# Patient Record
Sex: Female | Born: 1940 | Race: White | Hispanic: No
Health system: Midwestern US, Community
[De-identification: ages and names within clinical notes are randomized; demographics above are authoritative.]

## PROBLEM LIST (undated history)

## (undated) DIAGNOSIS — I10 Essential (primary) hypertension: Secondary | ICD-10-CM

## (undated) DIAGNOSIS — C541 Malignant neoplasm of endometrium: Secondary | ICD-10-CM

## (undated) DIAGNOSIS — E559 Vitamin D deficiency, unspecified: Secondary | ICD-10-CM

## (undated) DIAGNOSIS — I493 Ventricular premature depolarization: Secondary | ICD-10-CM

## (undated) DIAGNOSIS — I739 Peripheral vascular disease, unspecified: Secondary | ICD-10-CM

## (undated) DIAGNOSIS — I2510 Atherosclerotic heart disease of native coronary artery without angina pectoris: Secondary | ICD-10-CM

## (undated) DIAGNOSIS — I671 Cerebral aneurysm, nonruptured: Secondary | ICD-10-CM

## (undated) DIAGNOSIS — E785 Hyperlipidemia, unspecified: Secondary | ICD-10-CM

## (undated) DIAGNOSIS — I5181 Takotsubo syndrome: Secondary | ICD-10-CM

## (undated) DIAGNOSIS — E7800 Pure hypercholesterolemia, unspecified: Secondary | ICD-10-CM

## (undated) DIAGNOSIS — I491 Atrial premature depolarization: Secondary | ICD-10-CM

## (undated) DIAGNOSIS — I517 Cardiomegaly: Secondary | ICD-10-CM

## (undated) DIAGNOSIS — I350 Nonrheumatic aortic (valve) stenosis: Secondary | ICD-10-CM

## (undated) DIAGNOSIS — K219 Gastro-esophageal reflux disease without esophagitis: Secondary | ICD-10-CM

## (undated) DIAGNOSIS — I119 Hypertensive heart disease without heart failure: Secondary | ICD-10-CM

## (undated) DIAGNOSIS — I6529 Occlusion and stenosis of unspecified carotid artery: Secondary | ICD-10-CM

## (undated) DIAGNOSIS — I421 Obstructive hypertrophic cardiomyopathy: Secondary | ICD-10-CM

## (undated) DIAGNOSIS — I214 Non-ST elevation (NSTEMI) myocardial infarction: Secondary | ICD-10-CM

## (undated) DIAGNOSIS — N950 Postmenopausal bleeding: Secondary | ICD-10-CM

## (undated) DIAGNOSIS — Z780 Asymptomatic menopausal state: Secondary | ICD-10-CM

## (undated) DIAGNOSIS — R252 Cramp and spasm: Principal | ICD-10-CM

## (undated) DIAGNOSIS — E782 Mixed hyperlipidemia: Secondary | ICD-10-CM

## (undated) HISTORY — DX: Cardiomegaly: I51.7

## (undated) HISTORY — DX: Obstructive hypertrophic cardiomyopathy: I42.1

## (undated) HISTORY — DX: Takotsubo syndrome: I51.81

## (undated) HISTORY — DX: Hypertensive heart disease without heart failure: I11.9

## (undated) HISTORY — DX: Nonrheumatic aortic (valve) stenosis: I35.0

## (undated) HISTORY — PX: ABDOMINAL HYSTERECTOMY: SHX81

## (undated) HISTORY — DX: Cerebral aneurysm, nonruptured: I67.1

## (undated) HISTORY — DX: Atrial premature depolarization: I49.1

## (undated) HISTORY — DX: Occlusion and stenosis of unspecified carotid artery: I65.29

## (undated) HISTORY — DX: Hyperlipidemia, unspecified: E78.5

## (undated) HISTORY — DX: Pure hypercholesterolemia, unspecified: E78.00

## (undated) HISTORY — PX: BELOW KNEE LEG AMPUTATION: SUR23

## (undated) HISTORY — DX: Essential (primary) hypertension: I10

## (undated) HISTORY — DX: Peripheral vascular disease, unspecified: I73.9

## (undated) HISTORY — DX: Atherosclerotic heart disease of native coronary artery without angina pectoris: I25.10

## (undated) HISTORY — DX: Ventricular premature depolarization: I49.3

---

## 1998-09-07 ENCOUNTER — Emergency Department (HOSPITAL_COMMUNITY): Admission: EM | Admit: 1998-09-07 | Discharge: 1998-09-07 | Payer: Self-pay

## 1998-10-19 ENCOUNTER — Encounter: Payer: Self-pay | Admitting: Chiropractic Medicine

## 1998-10-19 ENCOUNTER — Ambulatory Visit (HOSPITAL_COMMUNITY): Admission: RE | Admit: 1998-10-19 | Discharge: 1998-10-19 | Payer: Self-pay | Admitting: Chiropractic Medicine

## 1999-07-03 ENCOUNTER — Encounter: Admission: RE | Admit: 1999-07-03 | Discharge: 1999-07-03 | Payer: Self-pay | Admitting: *Deleted

## 2000-03-17 ENCOUNTER — Emergency Department (HOSPITAL_COMMUNITY): Admission: EM | Admit: 2000-03-17 | Discharge: 2000-03-17 | Payer: Self-pay | Admitting: Emergency Medicine

## 2001-10-25 ENCOUNTER — Encounter: Admission: RE | Admit: 2001-10-25 | Discharge: 2001-10-25 | Payer: Self-pay | Admitting: Family Medicine

## 2001-10-25 ENCOUNTER — Encounter: Payer: Self-pay | Admitting: Family Medicine

## 2002-12-20 ENCOUNTER — Encounter: Admission: RE | Admit: 2002-12-20 | Discharge: 2002-12-20 | Payer: Self-pay | Admitting: Family Medicine

## 2002-12-20 ENCOUNTER — Encounter: Payer: Self-pay | Admitting: Family Medicine

## 2003-02-12 ENCOUNTER — Encounter: Payer: Self-pay | Admitting: Vascular Surgery

## 2003-02-13 ENCOUNTER — Ambulatory Visit (HOSPITAL_COMMUNITY): Admission: RE | Admit: 2003-02-13 | Discharge: 2003-02-13 | Payer: Self-pay | Admitting: Vascular Surgery

## 2003-12-26 ENCOUNTER — Other Ambulatory Visit: Admission: RE | Admit: 2003-12-26 | Discharge: 2003-12-26 | Payer: Self-pay | Admitting: Family Medicine

## 2004-01-30 ENCOUNTER — Encounter: Admission: RE | Admit: 2004-01-30 | Discharge: 2004-01-30 | Payer: Self-pay | Admitting: Family Medicine

## 2004-05-09 ENCOUNTER — Ambulatory Visit (HOSPITAL_COMMUNITY): Admission: RE | Admit: 2004-05-09 | Discharge: 2004-05-09 | Payer: Self-pay | Admitting: Gastroenterology

## 2005-02-18 ENCOUNTER — Encounter: Admission: RE | Admit: 2005-02-18 | Discharge: 2005-02-18 | Payer: Self-pay | Admitting: Family Medicine

## 2005-10-07 ENCOUNTER — Emergency Department (HOSPITAL_COMMUNITY): Admission: EM | Admit: 2005-10-07 | Discharge: 2005-10-07 | Payer: Self-pay | Admitting: Emergency Medicine

## 2005-11-10 DIAGNOSIS — I739 Peripheral vascular disease, unspecified: Secondary | ICD-10-CM

## 2005-11-10 HISTORY — DX: Peripheral vascular disease, unspecified: I73.9

## 2005-11-25 ENCOUNTER — Encounter (INDEPENDENT_AMBULATORY_CARE_PROVIDER_SITE_OTHER): Payer: Self-pay | Admitting: *Deleted

## 2005-11-25 ENCOUNTER — Ambulatory Visit (HOSPITAL_COMMUNITY): Admission: RE | Admit: 2005-11-25 | Discharge: 2005-11-25 | Payer: Self-pay | Admitting: Family Medicine

## 2005-12-10 ENCOUNTER — Encounter: Admission: RE | Admit: 2005-12-10 | Discharge: 2005-12-10 | Payer: Self-pay | Admitting: Family Medicine

## 2005-12-13 ENCOUNTER — Emergency Department (HOSPITAL_COMMUNITY): Admission: EM | Admit: 2005-12-13 | Discharge: 2005-12-13 | Payer: Self-pay | Admitting: Emergency Medicine

## 2005-12-15 ENCOUNTER — Ambulatory Visit (HOSPITAL_COMMUNITY): Admission: RE | Admit: 2005-12-15 | Discharge: 2005-12-15 | Payer: Self-pay | Admitting: Vascular Surgery

## 2005-12-21 ENCOUNTER — Encounter (INDEPENDENT_AMBULATORY_CARE_PROVIDER_SITE_OTHER): Payer: Self-pay | Admitting: Specialist

## 2005-12-21 ENCOUNTER — Inpatient Hospital Stay (HOSPITAL_COMMUNITY): Admission: RE | Admit: 2005-12-21 | Discharge: 2005-12-25 | Payer: Self-pay | Admitting: Vascular Surgery

## 2006-01-19 ENCOUNTER — Encounter: Admission: RE | Admit: 2006-01-19 | Discharge: 2006-02-18 | Payer: Self-pay | Admitting: Vascular Surgery

## 2006-02-26 ENCOUNTER — Encounter: Admission: RE | Admit: 2006-02-26 | Discharge: 2006-02-26 | Payer: Self-pay | Admitting: Family Medicine

## 2006-03-12 ENCOUNTER — Inpatient Hospital Stay (HOSPITAL_COMMUNITY): Admission: AD | Admit: 2006-03-12 | Discharge: 2006-03-22 | Payer: Self-pay | Admitting: Vascular Surgery

## 2006-03-18 ENCOUNTER — Encounter: Payer: Self-pay | Admitting: Vascular Surgery

## 2006-06-01 ENCOUNTER — Inpatient Hospital Stay (HOSPITAL_COMMUNITY): Admission: EM | Admit: 2006-06-01 | Discharge: 2006-06-10 | Payer: Self-pay | Admitting: Emergency Medicine

## 2006-06-01 ENCOUNTER — Encounter: Payer: Self-pay | Admitting: Emergency Medicine

## 2006-08-04 ENCOUNTER — Inpatient Hospital Stay (HOSPITAL_COMMUNITY): Admission: RE | Admit: 2006-08-04 | Discharge: 2006-08-13 | Payer: Self-pay | Admitting: Vascular Surgery

## 2006-08-06 ENCOUNTER — Encounter: Payer: Self-pay | Admitting: Vascular Surgery

## 2006-08-18 ENCOUNTER — Ambulatory Visit: Payer: Self-pay | Admitting: Vascular Surgery

## 2006-08-29 ENCOUNTER — Inpatient Hospital Stay (HOSPITAL_COMMUNITY): Admission: EM | Admit: 2006-08-29 | Discharge: 2006-09-06 | Payer: Self-pay | Admitting: Emergency Medicine

## 2006-09-01 ENCOUNTER — Ambulatory Visit: Payer: Self-pay | Admitting: Vascular Surgery

## 2006-09-02 ENCOUNTER — Encounter (INDEPENDENT_AMBULATORY_CARE_PROVIDER_SITE_OTHER): Payer: Self-pay | Admitting: Specialist

## 2006-09-03 ENCOUNTER — Ambulatory Visit: Payer: Self-pay | Admitting: Physical Medicine & Rehabilitation

## 2006-09-06 ENCOUNTER — Inpatient Hospital Stay (HOSPITAL_COMMUNITY)
Admission: RE | Admit: 2006-09-06 | Discharge: 2006-09-10 | Payer: Self-pay | Admitting: Physical Medicine & Rehabilitation

## 2006-09-17 ENCOUNTER — Ambulatory Visit: Payer: Self-pay | Admitting: Vascular Surgery

## 2006-09-24 ENCOUNTER — Ambulatory Visit: Payer: Self-pay | Admitting: Vascular Surgery

## 2006-10-02 ENCOUNTER — Inpatient Hospital Stay (HOSPITAL_COMMUNITY): Admission: EM | Admit: 2006-10-02 | Discharge: 2006-10-05 | Payer: Self-pay | Admitting: Emergency Medicine

## 2006-10-08 ENCOUNTER — Ambulatory Visit: Payer: Self-pay | Admitting: Vascular Surgery

## 2006-10-22 ENCOUNTER — Ambulatory Visit: Payer: Self-pay | Admitting: Vascular Surgery

## 2006-10-29 ENCOUNTER — Encounter
Admission: RE | Admit: 2006-10-29 | Discharge: 2007-01-27 | Payer: Self-pay | Admitting: Physical Medicine & Rehabilitation

## 2006-11-02 ENCOUNTER — Ambulatory Visit: Payer: Self-pay | Admitting: Physical Medicine & Rehabilitation

## 2006-11-05 ENCOUNTER — Ambulatory Visit: Payer: Self-pay | Admitting: Vascular Surgery

## 2006-11-19 ENCOUNTER — Ambulatory Visit: Payer: Self-pay | Admitting: Vascular Surgery

## 2006-11-22 ENCOUNTER — Observation Stay (HOSPITAL_COMMUNITY): Admission: AD | Admit: 2006-11-22 | Discharge: 2006-12-02 | Payer: Self-pay | Admitting: Vascular Surgery

## 2006-11-22 ENCOUNTER — Ambulatory Visit: Payer: Self-pay | Admitting: Vascular Surgery

## 2006-12-07 ENCOUNTER — Ambulatory Visit: Payer: Self-pay | Admitting: Vascular Surgery

## 2006-12-09 ENCOUNTER — Encounter: Payer: Self-pay | Admitting: Vascular Surgery

## 2006-12-09 ENCOUNTER — Inpatient Hospital Stay (HOSPITAL_COMMUNITY): Admission: RE | Admit: 2006-12-09 | Discharge: 2006-12-14 | Payer: Self-pay | Admitting: Vascular Surgery

## 2006-12-10 ENCOUNTER — Ambulatory Visit: Payer: Self-pay | Admitting: Physical Medicine & Rehabilitation

## 2006-12-31 ENCOUNTER — Ambulatory Visit: Payer: Self-pay | Admitting: Vascular Surgery

## 2007-01-07 ENCOUNTER — Ambulatory Visit: Payer: Self-pay | Admitting: Vascular Surgery

## 2007-02-21 ENCOUNTER — Encounter
Admission: RE | Admit: 2007-02-21 | Discharge: 2007-05-22 | Payer: Self-pay | Admitting: Physical Medicine & Rehabilitation

## 2007-03-09 ENCOUNTER — Encounter: Admission: RE | Admit: 2007-03-09 | Discharge: 2007-05-19 | Payer: Self-pay | Admitting: Vascular Surgery

## 2007-06-15 ENCOUNTER — Other Ambulatory Visit: Admission: RE | Admit: 2007-06-15 | Discharge: 2007-06-15 | Payer: Self-pay | Admitting: Family Medicine

## 2007-06-16 ENCOUNTER — Encounter: Admission: RE | Admit: 2007-06-16 | Discharge: 2007-06-16 | Payer: Self-pay | Admitting: Family Medicine

## 2008-04-29 ENCOUNTER — Encounter: Admission: RE | Admit: 2008-04-29 | Discharge: 2008-04-29 | Payer: Self-pay | Admitting: Family Medicine

## 2008-05-31 ENCOUNTER — Ambulatory Visit (HOSPITAL_COMMUNITY): Admission: RE | Admit: 2008-05-31 | Discharge: 2008-05-31 | Payer: Self-pay | Admitting: Neurosurgery

## 2008-06-04 ENCOUNTER — Encounter
Admission: RE | Admit: 2008-06-04 | Discharge: 2008-06-05 | Payer: Self-pay | Admitting: Physical Medicine & Rehabilitation

## 2008-06-05 ENCOUNTER — Ambulatory Visit: Payer: Self-pay | Admitting: Physical Medicine & Rehabilitation

## 2008-06-05 ENCOUNTER — Encounter
Admission: RE | Admit: 2008-06-05 | Discharge: 2008-07-09 | Payer: Self-pay | Admitting: Physical Medicine & Rehabilitation

## 2008-07-26 ENCOUNTER — Encounter
Admission: RE | Admit: 2008-07-26 | Discharge: 2008-10-23 | Payer: Self-pay | Admitting: Physical Medicine & Rehabilitation

## 2008-12-21 ENCOUNTER — Encounter: Admission: RE | Admit: 2008-12-21 | Discharge: 2008-12-21 | Payer: Self-pay | Admitting: Family Medicine

## 2008-12-25 ENCOUNTER — Encounter: Admission: RE | Admit: 2008-12-25 | Discharge: 2008-12-25 | Payer: Self-pay | Admitting: Family Medicine

## 2009-06-14 ENCOUNTER — Encounter: Admission: RE | Admit: 2009-06-14 | Discharge: 2009-06-14 | Payer: Self-pay | Admitting: Family Medicine

## 2009-08-30 NOTE — Progress Notes (Signed)
Subjective:       Valerie Barry is a 69 y.o. female I am seeing for chest discomfort. Symptoms include: chest pressure/discomfort  Cardiac risk factors: family history, post-menopausal.She notes that since retiring she is very active and was raking leaves and she was lifting bags of leaves and she had no issues at the time.   Last week after raking she had chest discomfort the next day and if felt comfortable and at the same time her back was hurting. She has not had any further chest discomfort since the episode.   She has no palpations and no shortness of breath.    Allergies   Allergen Reactions   ??? Codeine Nausea Only             Past Medical History   Diagnosis Date   ??? Essential hypertension    ??? Hyperlipidemia           No past surgical history on file.     Current outpatient prescriptions   Medication Sig   ??? lisinopril (PRINIVIL, ZESTRIL) 20 mg tablet Take  by mouth daily.   ??? fish oil-dha-epa (FISH OIL) 1,200-144-216 mg Cap Take 1,200 mg by mouth four (4) times daily.             Objective:     ECG:(08/30/09) normal EKG, normal sinus rhythm with short PR interval    Cardiac work up to date:    None to date       Review of Symptoms:  A comprehensive review of systems was negative except for that written in the HPI.    BP 130/60   Pulse 73   Wt 202 lb (91.627 kg)    Physical Exam:    BP 130/60   Pulse 73   Wt 202 lb (91.627 kg)  General Appearance:  Well developed, well nourished,alert and oriented x 3, and individual in no acute distress.   Ears/Nose/Mouth/Throat:   Hearing grossly normal.         Neck: Supple.   Chest:   Lungs clear to auscultation bilaterally.   Cardiovascular:  Regular rate and rhythm, S1, S2 normal, no murmur.   Abdomen:   Soft, non-tender, bowel sounds are active.   Extremities: No edema bilaterally.    Skin: Warm and dry.                Assessment/Plan:     1. Chest discomfort (786.59E)  ECHOCARDIOGRAM             Adjustments in medication: none    Discussion:      Valerie Barry is a lady who had experienced chest discomfort after working in the yard. She rested for a while and worked again in the yard yesterday hauling leaves with no difficulty. She became somewhat concerned but feels very strongly that she does not need any cardiac testing. I would like for her to have an echocardiogram to look at her LV function and if there is any regional wall motion abnormalities will do an adenosine stress test as she does not want to walk.                             I have discussed the diagnosis with the patient and the intended plan as seen in the above orders.  The patient has received an after-visit summary and questions were answered concerning future plans.  I have discussed medication side effects  and warnings with the patient as well.    Valerie Barry is in agreement to the plan listed above and wishes to proceed.     she  was instructed not to smoke, eat heart healthy diet  and to exercise.     Follow-up Disposition:  Return in about 6 weeks (around 10/11/2009).           Rushie Chestnut, M.D., MD

## 2009-09-06 NOTE — Progress Notes (Signed)
See scanned report.

## 2009-09-10 NOTE — Progress Notes (Signed)
See scanned report in Media tab of "Chart Review" activity.

## 2010-01-09 NOTE — Telephone Encounter (Signed)
Pl call Valerie Barry regarding pt.  Needs all ofice notes, last visit and any ekgs.  Thanks  Fax # [phone] and phone [phone].

## 2010-01-10 NOTE — Telephone Encounter (Signed)
Fax'd req. info.

## 2010-01-22 ENCOUNTER — Encounter: Admission: RE | Admit: 2010-01-22 | Discharge: 2010-01-22 | Payer: Self-pay | Admitting: Neurosurgery

## 2010-06-16 MED ORDER — VALSARTAN 160 MG TAB
160 mg | ORAL_TABLET | Freq: Every day | ORAL | Status: DC
Start: 2010-06-16 — End: 2010-06-17

## 2010-06-16 NOTE — Progress Notes (Signed)
HISTORY OF PRESENT ILLNESS  Valerie Barry is a 69 y.o. female.  HPI  New patient to my practice, referred to me by self.  Retired [employer].   Prior medical care has been from [facility].  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 Concha Pyo, complicated by urinary retention.  At first, attributed to spinal ansthesia, but then acute urinary retention w blood clots w 7 liters of urine per report.   Has acute renal failure and saw DR. Sherrine Maples.  This is resolved.   Passed urine and is seeing Dr. Truddie Hidden and ultrasounds and bladder function is improved now.    Colonoscopy showed hemorrhoids 01/2010    Hypertension  Hypertension ROS: taking medications as instructed some flushing of her face after taking lisinopril which bothers her.  Her mother and brother takes Diovan.   no TIA's, no chest pain on exertion, no dyspnea on exertion, no swelling of ankles     reports that she has never smoked. She has never used smokeless tobacco.    has no alcohol history on file.   BP Readings from Last 2 Encounters:   06/16/10 149/69   08/30/09 130/60     Hyperlipidemia  Currently she takes no meds since 01/2010, was taking gemfibrozil  ROS: taking medications as instructed, no medication side effects noted  No new myalgias, no joint pains, no weakness  No TIA's, no chest pain on exertion, no dyspnea on exertion, no swelling of ankles.   No results found for this basename: CHOL, CHOLX, CHOLP, CHLST, CHOLV, HDL, LDL, DLDL, LDLC, DLDLP, TGL, TGLX, TRIGL, TRIGP       Review of Systems   Constitutional: Negative for fever, malaise/fatigue and diaphoresis.   Eyes: Negative for blurred vision.   Respiratory: Negative for cough, sputum production, shortness of breath and wheezing.    Cardiovascular: Negative for chest pain, palpitations, orthopnea, claudication, leg swelling and PND.   Gastrointestinal: Negative for nausea and abdominal pain.   Genitourinary: Negative for frequency.    Musculoskeletal: Negative for myalgias.   Neurological: Negative for dizziness, sensory change, focal weakness, weakness and headaches.   Psychiatric/Behavioral: The patient does not have insomnia.    All other systems reviewed and are negative.        Physical Exam   Nursing note and vitals reviewed.  Constitutional: She is oriented to person, place, and time. No distress.   Neck: No JVD present. No thyromegaly present.   Cardiovascular: Normal rate, regular rhythm and intact distal pulses.  Exam reveals no gallop and no friction rub.    Pulmonary/Chest: Effort normal and breath sounds normal.   Abdominal: Soft. Normal aorta and bowel sounds are normal. She exhibits no abdominal bruit and no pulsatile midline mass. No tenderness.   Musculoskeletal: She exhibits no edema.   Neurological: She is alert and oriented to person, place, and time.   Skin: No rash noted.       ASSESSMENT and PLAN  Valerie Barry was seen today for establish care and labs.    Diagnoses and associated orders for this visit:    Htn (hypertension)- uncontrolled, rxn to lisinopril.  Will try diovan. If not affordable, try losartan but I am not sure if it will be strong enough  - valsartan (DIOVAN) 160 mg tablet; Take 1 Tab by mouth daily.  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   Prefers no meds.    -  LIPID PANEL    Knee pain - stable        rtc 1 mo

## 2010-06-17 LAB — METABOLIC PANEL, COMPREHENSIVE
A-G Ratio: 1.7 (ref 1.1–2.5)
ALT (SGPT): 12 IU/L (ref 0–40)
AST (SGOT): 14 IU/L (ref 0–40)
Albumin: 4.6 g/dL (ref 3.5–4.8)
Alk. phosphatase: 87 IU/L (ref 25–165)
BUN/Creatinine ratio: 21 (ref 11–26)
BUN: 13 mg/dL (ref 8–27)
Bilirubin, total: 0.3 mg/dL (ref 0.0–1.2)
CO2: 26 mmol/L (ref 20–32)
Calcium: 9.6 mg/dL (ref 8.6–10.2)
Chloride: 101 mmol/L (ref 97–108)
Creatinine: 0.63 mg/dL (ref 0.57–1.00)
GFR est AA: 104 mL/min/{1.73_m2} (ref >59)
GFR est non-AA: 91 mL/min/{1.73_m2} (ref >59)
GLOBULIN, TOTAL: 2.7 g/dL (ref 1.5–4.5)
Glucose: 109 mg/dL — ABNORMAL HIGH (ref 65–99)
Potassium: 4.2 mmol/L (ref 3.5–5.2)
Protein, total: 7.3 g/dL (ref 6.0–8.5)
Sodium: 140 mmol/L (ref 135–145)

## 2010-06-17 LAB — LIPID PANEL
Cholesterol, total: 243 mg/dL — ABNORMAL HIGH (ref 100–199)
HDL Cholesterol: 74 mg/dL (ref >39)
LDL, calculated: 142 mg/dL — ABNORMAL HIGH (ref 0–99)
Triglyceride: 133 mg/dL (ref 0–149)
VLDL, calculated: 27 mg/dL (ref 5–40)

## 2010-06-17 MED ORDER — LOSARTAN 100 MG TAB
100 mg | ORAL_TABLET | Freq: Every day | ORAL | Status: DC
Start: 2010-06-17 — End: 2010-08-21

## 2010-06-17 NOTE — Telephone Encounter (Signed)
Dr. Samuel Bouche [id number] [facility] [phone] left a voice message today 06/17/2010 at 11:30am advising the pt's insurance will not pay for the brand named Diovan Rx Escribed yesterday; however, will cover the generic Rx. Please advise.

## 2010-06-17 NOTE — Progress Notes (Addendum)
Quick Note:    Results reviewed. Message sent to patient via MyChart. Please refer to encounter for details.   Your cholesterol is a little elevated. Ideally, I would recommend you take a medication such as simvastatin or Lipitor, but I definitely do not think you need to take gemfibrozil since your triglycerides are ok! Work on diet and exercise. I think it is ok to NOT take medications, as you prefer  ______

## 2010-06-17 NOTE — Telephone Encounter (Signed)
I sent in losartan to replace lisinopril and diovan for blood pressure

## 2010-08-03 ENCOUNTER — Encounter: Payer: Self-pay | Admitting: Vascular Surgery

## 2010-08-03 ENCOUNTER — Encounter: Payer: Self-pay | Admitting: Family Medicine

## 2010-08-21 MED ORDER — ASPIRIN 81 MG CHEWABLE TAB
81 mg | ORAL_TABLET | Freq: Every day | ORAL | Status: DC
Start: 2010-08-21 — End: 2013-10-23

## 2010-08-21 MED ORDER — VALSARTAN 320 MG TAB
320 mg | ORAL_TABLET | Freq: Every day | ORAL | Status: DC
Start: 2010-08-21 — End: 2010-08-21

## 2010-08-21 MED ORDER — TELMISARTAN 80 MG TAB
80 mg | ORAL_TABLET | Freq: Every day | ORAL | Status: DC
Start: 2010-08-21 — End: 2010-11-04

## 2010-08-21 NOTE — Progress Notes (Signed)
HISTORY OF PRESENT ILLNESS  Valerie Barry is a 70 y.o. female.  HPI  Hypertension - changed lisinopril to losartan 100 mg last visit. Feels dizzy, BP is uncontrolled  Hypertension ROS: taking medications as instructed, no medication side effects noted, home BP monitoring in range of 160's systolic over 80s's diastolic, no TIA's, no chest pain on exertion, no dyspnea on exertion, no swelling of ankles     reports that she has never smoked. She has never used smokeless tobacco.    reports that she does not drink alcohol.   BP Readings from Last 2 Encounters:   08/21/10 161/65   06/16/10 149/69       Under a lot of stress re: her mother Orion Crook, illness of her brother    Hyperlipidemia  Cardiovascular risk analysis - No meds.   Currently she takes no meds  ROS: taking medications as instructed, no medication side effects noted  No new myalgias, no joint pains, no weakness  No TIA's, no chest pain on exertion, no dyspnea on exertion, no swelling of ankles.   Lab Results   Component Value Date/Time    Cholesterol, total 243 06/16/2010 11:45 AM    LDL, calculated 142 06/16/2010 11:45 AM    Triglyceride 133 06/16/2010 11:45 AM       Review of Systems   Constitutional: Negative for malaise/fatigue and diaphoresis.   Eyes: Negative for blurred vision.   Respiratory: Negative for cough, sputum production, shortness of breath and wheezing.    Cardiovascular: Negative for chest pain, palpitations, orthopnea, claudication, leg swelling and PND.   Gastrointestinal: Negative for nausea and abdominal pain.   Genitourinary: Negative for frequency.   Musculoskeletal: Negative for myalgias.   Neurological: Negative for dizziness, sensory change, focal weakness, weakness and headaches.   Psychiatric/Behavioral: Negative for depression and suicidal ideas. The patient is nervous/anxious. The patient does not have insomnia.    All other systems reviewed and are negative.        Physical Exam   Nursing note and vitals reviewed.   Constitutional: No distress.   Neck: No JVD present. No thyromegaly present.   Cardiovascular: Normal rate, regular rhythm and intact distal pulses.  Exam reveals no gallop and no friction rub.    Pulmonary/Chest: Effort normal and breath sounds normal.   Abdominal: Soft. Normal aorta and bowel sounds are normal. She exhibits no abdominal bruit and no pulsatile midline mass. No tenderness.   Musculoskeletal: She exhibits no edema.   Skin: No rash noted.   Psychiatric: She has a normal mood and affect.       ASSESSMENT and PLAN  Kjersti was seen today for hypertension and knee pain.    Diagnoses and associated orders for this visit:    Htn (hypertension)- uncontrolled, multiple med intolerance.  Will do prior auth for diovan  - valsartan (DIOVAN) 320 mg tablet; Take 1 Tab by mouth daily. Intolerance to ACE-inhibitors and failed losartan therapy.  - aspirin 81 mg chewable tablet; Take 1 Tab by mouth daily.    Anxiety  Discussed deep breathing exercises, yoga, exercise.  Avoid caffeine and cold medicines.   Reviewed concept of anxiety as biochemical imbalance of neurotransmitters and rationale for treatment. Instructed patient to contact office or on-call physician promptly should condition worsen or any new symptoms appear and provided on-call telephone numbers.  IF THE PATIENT HAS ANY SUICIDAL OR HOMICIDAL IDEATION, CALL THE OFFICE, DISCUSS WITH A SUPPORT MEMBER OR GO TO THE ER IMMEDIATELY.  Patient was  agreeable with this plan.      Knee pain    Mixed hyperlipidemia - The patient is asked to make an attempt to improve diet and exercise patterns to aid in medical management of this problem.      Follow-up Disposition:  Return in about 3 months (around 11/19/2010) for fasting labs.    F/u ortho re: knee s/p replacement

## 2010-08-21 NOTE — Telephone Encounter (Signed)
Insurance denied Diovan 320 mg, must try Micardis, or two other drugs, per Dr Samuel Bouche will try Micardis 80 mg, left message for pt.

## 2010-08-21 NOTE — Telephone Encounter (Signed)
Not covered by insurance, new meds sent in ,message left for pt

## 2010-08-21 NOTE — Patient Instructions (Signed)
Learning About Low-Fat Eating  What is low-fat eating?  Most food has some fat in it. Your body needs some fat to be healthy. But some kinds of fats are healthier than others.  In a low-fat eating plan, you try to choose healthier fats and eat fewer unhealthy fats. Healthy fats include olive and canola oil. Try to avoid eating too much saturated fat (such as in cheese and meats) and trans fat (a type of fat found in many packaged snack foods and other baked goods).  You do not need to cut all fat from your diet. But you can make healthier choices about the types and amount of fat you eat.  Even though it is a good idea to choose healthier fats, it is still important to be careful of how much fat you eat, because all fats are high in calories.  What are the different types of fats?  Unhealthy fats  ?? Saturated fat. Saturated fats are mostly in animal foods, such as meat and dairy foods. Tropical oils, such as coconut oil, palm oil, and cocoa butter, are also saturated fats.   ?? Trans fat. Trans fats include shortening, partially hydrogenated vegetable oils, and hydrogenated vegetable oils. Trans fats are made when a liquid fat is turned into a solid fat (for example, when corn oil is made into stick margarine). They are in many processed foods, such as cookies, crackers, and snack foods.   ?? Cholesterol. Cholesterol is only in animal products, such as eggs, dairy foods, and meats.   Healthy fats  ?? Monounsaturated fat. Monounsaturated fats are liquid at room temperature but get solid when refrigerated. Eating foods that are high in this fat may help lower your "bad" (LDL) cholesterol, raise your "good" (HDL) cholesterol, and lower your chances of getting heart disease. This fat is found in canola oil, olive oil, peanut oil, olives, avocados, nuts, and nut butters.    ?? Polyunsaturated fat. Polyunsaturated fats are liquid at room temperature. They are in safflower, sunflower, and corn oils. They are also the main fat in seafood. Omega-3 fatty acids are types of polyunsaturated fat. Omega-3 fatty acids may lower your chances of getting heart disease. Fatty fish such as salmon and mackerel contain these healthy fatty acids. So do ground flaxseeds and flaxseed oil, soybeans, walnuts, and seeds.   Why cut down on unhealthy fats?  Eating foods that contain saturated fats can raise the LDL ("bad") cholesterol in your blood. Having a high level of LDL cholesterol increases your chance of clogged arteries (atherosclerosis), which can lead to coronary artery disease and heart attack.  Trans fat raises the level of "bad" LDL cholesterol in your blood and lowers the "good" HDL cholesterol in your blood. HDL cholesterol is important. It helps clear the bad cholesterol from your blood so it does not clog your arteries. A high level of HDL can lower your risk of having a heart attack.  In general:  ?? No more than 10% of your daily calories should come from saturated fat. This is about 20 grams in a 2,000-calorie diet.   ?? No more than 10% of your daily calories should come from polyunsaturated fat. This is about 20 grams in a 2,000-calorie diet.   ?? Monounsaturated fats can be up to 15% of your daily calories. This is about 25 to 30 grams in a 2,000-calorie diet.   If you're not sure how much fat you should be eating or how many calories you need each day   to stay at a healthy weight, talk to a registered dietitian. He or she can help you create a plan that's right for you.  What can you do to cut down on fat?  Foods like cheese, butter, sausage, and desserts can have a lot of unhealthy fats. Try these tips for healthier meals at home and when you eat out.  At home  ?? Fill up on fruits, vegetables, and whole grains.   ?? Think of meat as a side dish instead of as the main part of your meal.    ?? When you do eat meat, make it low-fat ground beef (97% lean), ground turkey breast (without skin added), meats with fat trimmed off before cooking, or skinless chicken.   ?? Try main dishes that use whole wheat pasta, brown rice, dried beans, or vegetables.   ?? Use cooking methods that use little or no fat, such as broiling, steaming, or grilling. Use cooking spray instead of oil. If you use oil, use a monounsaturated oil, such as canola or olive oil.   ?? Read food labels on canned, bottled, or packaged foods. Choose those with little saturated fat and no trans fat.   When eating out at a restaurant  ?? Order foods that are broiled or poached instead of fried or breaded.   ?? Cut back on the amount of butter or margarine that you use on bread. Use small amounts of olive oil instead.   ?? Order sauces, gravies, and salad dressings on the side, and use only a little.   ?? When you order pasta, choose tomato sauce instead of cream sauce.   ?? Ask for salsa with your baked potato instead of sour cream, butter, cheese, or bacon.     Where can you learn more?    Go to http://www.healthwise.net/[facility]   Enter W495 in the search box to learn more about "Learning About Low-Fat Eating."    ?? 2006-2011 Healthwise[employer]. Care instructions adapted under license by [facility] (which disclaims liability or warranty for this information). This care instruction is for use with your licensed healthcare professional. If you have questions about a medical condition or this instruction, always ask your healthcare professional. Healthwise, Incorporated disclaims any warranty or liability for your use of this information.  Content Version: 9.1.125182; Last Revised: August 29, 2008

## 2010-08-26 NOTE — Telephone Encounter (Signed)
Patient has asked, If nurse could call her back today and give her up date on the prior authorization that you were working on for her. Regarding her Diovan

## 2010-08-27 NOTE — Telephone Encounter (Signed)
Wants to  Continue on with Cozaar for at least 3 months for now, she cannot afford the Micardis at this time, her mother and brother both have been ill, she thinks she will be ok with this for now with your permission,if so please send 90 day supply to local pharmacy.

## 2010-08-27 NOTE — Telephone Encounter (Signed)
Cozaar is not strong enough for her blood pressure.  Advise she come in for samples of Micardis or diovan or Benicar if cost is an issue

## 2010-08-28 NOTE — Telephone Encounter (Signed)
Cost is an issue per pt, she wants to do the right thing, she has cozar  And plans to finish this, she will take samples if we have, no micardis is available, how about Benicar,what dose.

## 2010-08-28 NOTE — Telephone Encounter (Signed)
Try 40 mg Benicar.  Stop cozaar

## 2010-08-28 NOTE — Telephone Encounter (Signed)
Per Dr Samuel Bouche pt not controlled on Cozaar, Micardis 80 mg up front for pt , will pick up today

## 2010-08-28 NOTE — Telephone Encounter (Signed)
Advised pt insurance has denied,medication has been changed per Dr Samuel Bouche, pt aware and agrees

## 2010-08-28 NOTE — Telephone Encounter (Signed)
Pt was given a month's supply of  Micardis 80 mg for a trial basis before she buys, ok  per Dr Samuel Bouche, pt agrees to pick up.

## 2010-09-10 ENCOUNTER — Other Ambulatory Visit: Payer: Self-pay | Admitting: Family Medicine

## 2010-09-10 DIAGNOSIS — Z1231 Encounter for screening mammogram for malignant neoplasm of breast: Secondary | ICD-10-CM

## 2010-09-17 ENCOUNTER — Ambulatory Visit
Admission: RE | Admit: 2010-09-17 | Discharge: 2010-09-17 | Disposition: A | Discharge status: Home / Self Care | Payer: Medicare Other | Source: Ambulatory Visit | Attending: Family Medicine | Admitting: Family Medicine

## 2010-09-17 DIAGNOSIS — Z1231 Encounter for screening mammogram for malignant neoplasm of breast: Secondary | ICD-10-CM

## 2010-10-06 NOTE — Progress Notes (Signed)
Samples given states BP is much better with Micardis, will take for 2 more weeks and then will take bp and keep readings, call to office, will need a script at that time.

## 2010-10-06 NOTE — Telephone Encounter (Signed)
Patient would like for Larita Fife to call her regarding her Prescription her no is [phone]

## 2010-11-04 MED ORDER — TELMISARTAN 80 MG TAB
80 mg | ORAL_TABLET | Freq: Every day | ORAL | Status: AC
Start: 2010-11-04 — End: 2010-12-04

## 2010-11-04 NOTE — Telephone Encounter (Signed)
Dr Samuel Bouche had been giving her samples of Micardis 80 mg and now she needs a prescription called in today she only has 2 pills left. Call it in to the [facility] at [phone]

## 2010-11-04 NOTE — Telephone Encounter (Signed)
rx sent for 2 mo.  Needs appt then

## 2010-11-04 NOTE — Telephone Encounter (Signed)
Patient has appointment with Dr Samuel Bouche in May already

## 2010-11-25 NOTE — Assessment & Plan Note (Signed)
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  She received  a temporary prosthesis initially as this was the only thing improved  through her insurance.  She began to develop some issues with fit due to  weight gain over the last several months and never ended up getting a  final prosthesis for whatever reason.  She is here today regarding fit  of the leg and problems with pain particularly in her thigh with  walking.  She had been walking quite a bit up until a few months ago and  then for various reasons has backed off.  She has gained weight and diet  has been a bit poor.  She rates her pain overall 5-7/10 and usually is  in the inguinal region with contact on the leg.  She denies any skin  color changes, numbness, etc.  She has not been to see Vascular Surgery  since I saw her last year.  Sleep is fair overall.  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 basis.   REVIEW OF SYSTEMS:  Notable for trouble walking, dizziness, weight gain,  or shortness of breath with exertion.  Other pertinent positives are  above and full review is in the written health and history section in  the chart.   SOCIAL HISTORY:  The patient single and daughter is with her today and  supportive.   PHYSICAL EXAMINATION:  VITAL SIGNS:  Blood pressure is 128/48, pulse is  89, respiratory rate 18, and she is sating 97% on room air.  GENERAL:  The patient is pleasant, alert, and oriented x3.  Affect is  bright and appropriate.  She has gained some weight since I last saw  her, but really is not totally noticeable.  She put on her limb today  and had significant problems fitting the limb into the above-knee socket  with folds of tissue lying over the socket, which already had been cut  and accommodated to try to fit her current size.  She is able to stand  with the leg as it is and took a few steps, but was very out of sync and  difficulty with weight  shift, etc.  Left leg was intact.  Upper  extremity strength is normal with 5/5 noted in all muscle groups today.  Range of motion is within normal limits.  HEART:  Regular.  CHEST:  Clear.  ABDOMEN:  Soft and nontender.  NEUROLOGIC:  Cognition is within normal range.   ASSESSMENT:  Right below-knee amputation, now well healed, however,  prosthetic fit is complicated by weight gain.   PLAN:  1. 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  Due to her light weight still,      which is around 130 pounds, she will not really benefit from energy      storing foot, so we look at the single access foot to provide      range/stability at the ankle.  We will use a total knee for knee      control and to improve fluidity in her gait.  We will attempt a new      socket with a silicone liner and an additional flex liner to      accommodate fluctuations in her weight.  We will use a Velcro belt      suspension system as well.  2. 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 for breakthrough  phantom symptoms.  If she should have further problems with this, I      have asked her to follow up with my office.  Otherwise, we will      have her see her family physician.  3. After fitting of her prosthesis, she will begin retry of outpatient      PT to refine her gait.      Ranelle Oyster, M.D.  Electronically Signed     ZTS/MedQ  D:  06/05/2008 11:55:32  T:  06/06/2008 02:32:11  Job #:  [id number]

## 2010-11-25 NOTE — Op Note (Signed)
[name], [name]               ACCOUNT NO.:  [id number]   MEDICAL RECORD NO.:  [id number]          PATIENT TYPE:  INP   LOCATION:  2899                         FACILITY:  [facility]   PHYSICIAN:  [name], MD  DATE OF BIRTH:  [birth year]   DATE OF PROCEDURE:  12/09/2006  DATE OF DISCHARGE:                               OPERATIVE REPORT   PROCEDURE:  Right above-knee amputation.   PREOPERATIVE DIAGNOSIS:  Nonhealing below-knee amputation.   POSTOPERATIVE DIAGNOSIS:  Nonhealing below-knee amputation.   ANESTHESIA:  General.   ASSISTANT:  Jerold Coombe, P.A.-C   INDICATIONS:  The patient is a 70 year old female who has had multiple  previous right lower extremity bypasses performed.  She previously had a  below-knee amputation.  This has not healed.  She now presents for  elective above-knee amputation.   OPERATIVE DETAILS:  After obtaining informed consent, the patient was  taken to the operating room.  The patient was placed in supine position  on the operating table.  After induction of general anesthesia and  endotracheal intubation, the patient's right lower extremity was prepped  and draped in the usual sterile fashion.  A fishmouth-shaped incision  was made just above the knee on the right side.  Incision was carried  down through the subcutaneous tissues down to the level of the fascia.  The fascia was incised circumferentially.  The muscles were divided with  electrocautery.  There were two preexisting Gore-Tex grafts in the  tissues in the subsartorial space.  Neither one of these was patent.  These were dissected free circumferentially and pulled down into the  wound, ligated with a 2-0 silk tie proximally and allowed to retract up  into the thigh.  The sciatic nerve was divided with cautery and ligated  with a Vicryl tie.  The femoral vein was dissected free  circumferentially and ligated between silk ties.  The remainder of the  soft tissues was taken  down with cautery.  Periosteum was then raised on  the femur approximately 5 cm up to the apex of the fishmouth incision.  The femur was then divided at this level.  The anterior portion was  beveled with a rasp.  The wound was thoroughly irrigated with normal  saline solution.  Hemostasis was obtained.  Fascial edges were then  reapproximated using interrupted 2-0 Vicryl sutures.  Subcutaneous  tissues were reapproximated using a running 3-0 Vicryl suture.  The skin  was closed with staples.  The patient tolerated the procedure well and  there were no complications.  Instrument, sponge and needle count was  correct at the end of the case.  The patient was taken to the recovery  room in stable condition.      [name], MD  Electronically Signed     CEF/MEDQ  D:  12/09/2006  T:  12/09/2006  Job:  [id number]

## 2010-11-25 NOTE — Discharge Summary (Signed)
[name], [name]               ACCOUNT NO.:  [id number]   MEDICAL RECORD NO.:  [id number]          PATIENT TYPE:  INP   LOCATION:  2038                         FACILITY:  [facility]   PHYSICIAN:  [name], MD  DATE OF BIRTH:  Mar 30, 1941   DATE OF ADMISSION:  11/22/2006  DATE OF DISCHARGE:  12/02/2006                               DISCHARGE SUMMARY   ADDENDUM TO DISCHARGE SUMMARY:  Please see previously dictated discharge summary, job number [id number], for  specific details regarding primary and secondary diagnoses, procedures,  history and hospital course through Nov 28, 2006.   Initially, it was anticipated that Ms. [name] would be discharged home  Nov 28, 2006; however, she reported still significant amount of pain and  was requiring IV morphine, particularly before dressing changes.  Specifically, 8 mg of IV morphine.  We added Ultram to her pain  management with some improvement, but ultimately ordered a palliative  care consult for pain management.  However, by the time she was  evaluated by them, she felt her pain was better controlled and she  refused a full consult.  In regards to her right below knee amputation  stump site, she continued to have wound vac with dressing changes on  Monday, Wednesdays and Fridays.  Overall, it was felt to be healing  well, but there was moderate yellow drainage from the medial edge of her  wound.  There was a wound care assessment, reported 80% red and 20%  yellow tissue.  Pulse lavage was added for the remainder of her  hospitalization and was started on Nov 29, 2006.  Additional issues  addressed, included moderate hypertension with mild sinus tachycardia  with systolic blood pressures between 140-155 and heart rate up to 110.  She was started on Lopressor 25 mg b.i.d. and by discharge, she had an  improved blood pressure with heart rate now in the 80s and 90s, in sinus  rhythm.  On Dec 02, 2006, [name] felt her pain was controlled  on  Ultram and Percocet and, therefore, felt appropriate for discharge.  She  did request Ambien as needed for sleep and a prescription was provided.  She is discharged home in stable condition.   ADDITIONAL SECONDARY DIAGNOSIS:  Mild sinus tachycardia, improved with  beta-blocker therapy.   DISCHARGE INSTRUCTIONS:  Please see previously dictated discharge  summary.  She is to see Dr. Darrick Penna at his office in 2-3 weeks, our  office will contact her regarding specific appointment, date and time.   UPDATED DISCHARGE MEDICATIONS:  1. Aspirin 81 mg daily.  2. Multivitamin daily.  3. Neurontin 100 mg t.i.d.  4. Percocet 5/325 one to two tablets p.o. q.4-6 hours p.r.n. pain.  5. Avalide 150/12.5 mg daily.  6. Ultram 50 mg 1-2 tablets p.o. q.6 hours p.r.n. pain.  7. Lopressor 25 mg p.o. b.i.d.  8. Ambien 5 mg p.o. q.h.s. p.r.n. for sleep.      Jerold Coombe, P.A.      [name], MD  Electronically Signed    AWZ/MEDQ  D:  12/02/2006  T:  12/02/2006  Job:  [id number]

## 2010-11-25 NOTE — H&P (Signed)
NAMESWAN, FAIRFAX               ACCOUNT NO.:  [id number]   MEDICAL RECORD NO.:  [id number]          PATIENT TYPE:  INP   LOCATION:  NA                           FACILITY:  [facility]   PHYSICIAN:  [name], MD  DATE OF BIRTH:  08-28-1940   DATE OF ADMISSION:  11/22/2006  DATE OF DISCHARGE:                              HISTORY & PHYSICAL   HISTORY OF PRESENT ILLNESS:  Ms. Croson is a 70 year old female is status  post right below knee amputation. She has had difficulty healing the  amputation since that time.  She has had multiple debridement's in the  office and the wound is still healing poorly.  She is admitted for  operative debridement of her below knee amputation site.   PAST MEDICAL HISTORY:  1. Arterial occlusive disease.  2. Hypertension.  3. Elevated lipids.  4. She previously had a hysterectomy.   MEDICATIONS:  1. Aspirin 81 mg once daily.  2. Multivitamins.  3. Avalide 150/12.5 mg once daily.  4. Percocet p.r.n.   FAMILY HISTORY:  Noncontributory.   SOCIAL HISTORY:  She is a former tobacco user but has quit.   REVIEW OF SYSTEMS:  She denies any recent episodes of shortness of  breath, chest pain, stroke, TIA.   PHYSICAL EXAMINATION:  VITAL SIGNS:  Blood pressure is 162/80, heart  rate is 80 and regular.  HEENT:  Unremarkable. She has 2+ carotid pulses.  CHEST:  Clear to auscultation.  CARDIAC EXAM:  Regular rate and rhythm.  ABDOMEN:  Soft, nontender, nondistended. She has 2+ femoral pulses  bilaterally.  EXTREMITIES:  Right below knee amputation is open on the anterior aspect  with several areas of granulation and fibrinous exudate. There is no  obvious infection.  It is tender to palpitation.  Left lower extremity  has a 1+ left dorsalis pedis pulse and there are no ulcerations on the  foot.   Ms. Butkiewicz has a large open wound and non healing below knee amputation.  We will debride this in the operating room with admission to the  hospital and  possible VAC placement.  She understands the nature of the  procedure. Risks, benefits, possible complications. Also discussed with  her the possibility she would eventually need above knee amputation.      [name], MD  Electronically Signed     CEF/MEDQ  D:  11/21/2006  T:  11/21/2006  Job:  [id number]

## 2010-11-25 NOTE — H&P (Signed)
[name], [name]               ACCOUNT NO.:  [id number]   MEDICAL RECORD NO.:  [id number]          PATIENT TYPE:  INP   LOCATION:  NA                           FACILITY:  [facility]   PHYSICIAN:  Quita Skye. Hart Rochester, M.D.  DATE OF BIRTH:  June 02, 1941   DATE OF ADMISSION:  12/09/2006  DATE OF DISCHARGE:                              HISTORY & PHYSICAL   CHIEF COMPLAINT:  Severe pain and open right below knee amputation  stump, failure to heal.   HISTORY OF PRESENT ILLNESS:  This 70 year old female has had multiple  attempts at revascularization of the right leg and underwent a right  below knee amputation by Dr. Darrick Penna in March of this year.  This was  healing nicely and she fell at home and dehisced the wound and multiple  attempts to achieve healing since that time have been unsuccessful.  She  had debridement of this wound by Dr. Darrick Penna on Nov 22, 2006, and on Dec 02, 2006, she was discharged with a VAC in place and home health nurse  was to visit.  Because of severe pain, the patient removed the [facility] and  she comes to the office today complaining of continued pain.   ALLERGIES:  CODEINE.   MEDICATIONS:  1. Aspirin 81 mg per day.  2. Multivitamin daily.  3. Avalide 12.5 mg per day.  4. Vitamin D 400 units daily.  5. Coumadin 2.5 mg at night.  6. Tylox one to two tablets q.4-6h. p.r.n. pain.   PAST MEDICAL HISTORY:  1. Severe peripheral vascular disease, multiple failed bypass grafts      in the right lower extremity.  2. Hypertension.  3. Hyperlipidemia.  4. Negative for coronary artery disease and diabetes.   PAST SURGICAL HISTORY:  1. Hysterectomy.  2. Failed femoral-popliteal bypass grafts.  3. 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   SOCIAL HISTORY:  Patient was a tobacco abuser in the past but currently  does not use tobacco.  She is single and lives at home by herself.   PHYSICAL EXAMINATION:  GENERAL  APPEARANCE:  She is a female patient who  is complaining of pain in her right below knee amputation stump.  VITAL SIGNS:  Blood pressure 182/78, heart rate 79, respirations 18.  NECK:  Supple with 3+ carotid pulses palpable.  CHEST:  Clear to auscultation.  CARDIOVASCULAR:  Regular rhythm with no murmur.  ABDOMEN:  Soft and nontender with no masses.  She has 3+ femoral pulses  bilaterally.  EXTREMITIES:  Left foot is free of any ischemia or ulceration.  Right  leg has a below knee amputation stump which is open and has some  fibrinous exudate.  There is some dark skin edges on the anterior and  posterior flaps.  There is some necrotic material, although there is  some granulation tissue.  The end of the tibia is exposed.  She has a  flexion contracture at the knee at about 120 degrees.   IMPRESSION:  1. Nonhealing right below knee amputation stump  secondary to severe      vascular occlusive disease.  2. Hypertension.  3. Hyperlipidemia.   PLAN:  Admit the patient on Dec 09, 2006, for Dr. Darrick Penna to perform a  right above knee amputation for pain control and to allow this to heal  and questions have been thoroughly answered in the office by Dr. Hart Rochester  to the patient and her family, who agreed to proceed.      Quita Skye Hart Rochester, M.D.  Electronically Signed     JDL/MEDQ  D:  12/07/2006  T:  12/07/2006  Job:  [id number]

## 2010-11-25 NOTE — Discharge Summary (Signed)
[name], [name]               ACCOUNT NO.:  [id number]   MEDICAL RECORD NO.:  [id number]          PATIENT TYPE:  INP   LOCATION:  2038                         FACILITY:  [facility]   PHYSICIAN:  [name], MD  DATE OF BIRTH:  October 28, 1940   DATE OF ADMISSION:  11/22/2006  DATE OF DISCHARGE:                               DISCHARGE SUMMARY   PRIMARY DIAGNOSIS:  Non-healing right below-knee amputation.   SECONDARY DIAGNOSES:  1. Hypertension.  2. Peripheral vascular occlusive disease.   IN-HOSPITAL OPERATIONS AND PROCEDURES:  Debridement of right below-knee  amputation with placement of vac dressing.   HISTORY AND PHYSICAL AND HOSPITAL COURSE:  [name] is a 70 year old  female status post right below-knee amputation.  She has had difficulty  healing the amputation, since that time.  The patient has had multiple  debridements in the office, and the wound is still healing poorly.  He  was seen and evaluated by Dr. Darrick Penna.  Dr. Darrick Penna discussed with the  patient admitting her to [facility] and taking her to the  operating room for debridement of her below-knee amputation.  The  patient acknowledged her understanding and agreed to proceed.  For  details of the patient's past medical history and physical exam, please  see dictated H&P.  [name] was admitted to [facility] on Nov 22, 2006 and taken to the operating room where she underwent debridement  of right below knee amputation and placement of wound vac dressing.  The  patient tolerated this procedure well and was transferred to 2000 in  stable condition.  The patient's postoperative course was complicated,  secondary to pain management control.  She required placement of a PCA,  as well as use of oral pain medications.  The patient underwent Monday,  Wednesday, Friday wound vac change.  A wound care nurse was consulted  and performed the above-mentioned procedure.  The patient's right below-  knee  amputation wound was healing well.  The patient's vital signs were  followed closely.  The patient remained afebrile.  SATs greater than 90%  on room air.  Care management was consulted for arrangement of the wound  vac at home.  This was arranged, as well as a home health nurse.  Prior  to discharge home,  the patient was able to be weaned off the morphine  PCA, and pain was controlled with oral p.o. medications.   The patient was extensively ready for discharge home in the a.m. Nov 29, 2006.   FOLLOW-UP APPOINTMENTS:  A follow-up appointment will be arranged with  Dr. Darrick Penna for in 1 week.  Our office will contact the patient with this  information.   ACTIVITY:  The patient is to continue physical therapy as prior.   INCISIONAL CARE:  A home health nurse has been arranged for wound vac  management and change to be done Monday, Wednesday, Friday.   DIET:  The patient to begin on diet, to be low-fat, low-salt.   DISCHARGE MEDICATIONS:  1. Aspirin 81 mg daily.  2. Multivitamin daily.  3. Neuro 100  mg t.i.d.  4. Percocet 5/325 1-2 tablets q. 4-6 hours p.r.n..  5. Avalide 150/12.5 mg daily.      [name], PA      [name], MD  Electronically Signed    KMD/MEDQ  D:  11/28/2006  T:  11/28/2006  Job:  [id number]   cc:   Janetta Hora. Darrick Penna, MD

## 2010-11-25 NOTE — Discharge Summary (Signed)
[name], [name]               ACCOUNT NO.:  [id number]   MEDICAL RECORD NO.:  [id number]          PATIENT TYPE:  INP   LOCATION:  2007                         FACILITY:  [facility]   PHYSICIAN:  [name], MD  DATE OF BIRTH:  [birth year]   DATE OF ADMISSION:  12/09/2006  DATE OF DISCHARGE:  12/14/2006                               DISCHARGE SUMMARY   ADMISSION DIAGNOSIS:  Nonhealing right below-knee amputation stump,  secondary to severe peripheral vascular occlusive disease.   DISCHARGE/SECONDARY DIAGNOSES:  1. Nonhealing right below-knee amputation stump, secondary to severe      peripheral vascular occlusive disease, status post right above-knee      amputation.  2. Hypertension.  3. Hyperlipidemia.  4. History of hysterectomy.  5. History of multiple failed bypass grafts in the right lower      extremity.  6. Right above-knee amputation in February 2008 with subsequent      debridement on Nov 22, 2006.   PROCEDURE:  Dec 09, 2006:  Right above-knee amputation by [name].   CONSULTING PHYSICIAN:  1. Rehabilitation medicine.  2. Physical therapy/occupational therapy.   HISTORY:  [name] is a 70 year old African-American female with  peripheral vascular occlusion disease status post prior right  femoral/popliteal bypass graft, status post multiple revisions.  She  ultimately required right below-knee amputation by [name] in March  of this year.  This was healing nicely until she fell at home and  dehisced the wound.  There have been multiple attempts to achieve  healing since that time which have been unsuccessful.  She was admitted  on Nov 22, 2006 for debridement of her stump and placement of a wound  VAC.  Her hospital course was somewhat prolonged due to pain management  issues, but she ultimately was discharged on Dec 08, 2006.  She went  home with home health nursing and wound VAC, but because of severe pain,  this was removed and she was  seen by [name]' partner, [name],  who felt that she should be admitted for pain control and probably  conversion to right above-knee amputation after examined by [name].   HOSPITAL COURSE:  [name] was admitted to [facility] on Dec 09, 2006.  She ultimately did undergo right above-knee amputation.  At  the time of this dictation, she has had an uneventful postoperative  course.  She had required morphine PCA for pain management.  Recently  this has been discontinued and she has been started on oxycodone and  tramadol p.r.n.  Her disposition will most likely depend on how well her  pain is controlled as she has already been seen by rehabilitation  medicine and physical therapy/occupational therapy who all felt that she  could be managed at home with home health therapies.  Home health  physical therapy/occupational therapy, skilled nursing and home health  have all been ordered prior to discharge.  Her postoperative labs  remained stable, showing a decreasing white count of 11.4, hemoglobin  10.4, hematocrit 31.2, platelet count 309,000; sodium 132, potassium  4.1, BUN  4, creatinine 0.44, blood glucose 142.  Her vitals have been  stable.  She currently is afebrile with a temperature of 98.5.  Heart  rate has been in the seventies to nineties and in sinus rhythm.  Her  blood pressure:  She has had some mildly elevated blood pressures up to  160 systolic and she has been restarted on her home antihypertensive  regimen.  Her incision appears to be healing well, although with a small  amount of serous drainage and some mild edema.  We have continued daily  dressing changes with gauze dressing and ace wrap.  Once this pain is  controlled, the patient will be ready for discharge home, hopefully by  post-op day #4 or 5, June 3rd or June 4th, 2008.   DISCHARGE MEDICATIONS:  1. Aspirin 81 mg daily.  2. Avalide 150/12.5 mg daily.  3. Neurontin 100 mg t.i.d.  4.  Multivitamin p.o. daily.  5. Ultram 50 mg one to two tablets p.o. q.6h. p.r.n. pain.  6. Percocet 5/325 mg one tablet p.o. q.4h. p.r.n. pain.  7. Ambien 5 mg p.o. q.h.s. p.r.n. for sleep.  8. Lopressor 25 mg p.o. b.i.d.   DISCHARGE INSTRUCTIONS:  She is to increase her activities slowly and be  out of bed with assistance.  She should avoid driving until cleared from  a therapy standpoint.  She may shower and clean her incision gently with  soap and water.  She should call if she develops fever greater than 101  or redness or drainage from her incision site.  Her right stump site  should be covered with gauze dressing and ace wrap daily.  She may  continue her low-sodium, heart-healthy diet.  She will see [name] in  the [facility] office in approximately three to four weeks.  The office will  contact her regarding specific appointment date and time.  She is to see  [name] in the prosthetic clinic in four to six weeks and this can be  arranged after her follow-up with [name] if her wounds are healing  appropriately.      [name], P.A.      [name], MD  Electronically Signed    AWZ/MEDQ  D:  12/13/2006  T:  12/13/2006  Job:  [id number]

## 2010-11-25 NOTE — Assessment & Plan Note (Signed)
OFFICE VISIT   [name], [name]  DOB:  Nov 04, 1940                                       01/07/2007  ZOXWR#:[id number]   The patient returns for followup today after her above the knee  amputation on Dec 09, 2006.  The right leg amputation is now well  healed.  There is a small area on the medial aspect which is still  slightly separated but should heal over time.  She is going to place an  ace-wrap on this now to decrease the edema.  She will convert this over  to a shrinker within the next couple weeks if the would completely  heals.  She was given a prescription today for physical therapy for  strengthening and conditioning in her left leg so that we could  hopefully fit her for a prosthetic leg on the right in the near future.  She will follow up with me in 6 weeks' time.   [name], MD  Electronically Signed   CEF/MEDQ  D:  01/07/2007  T:  01/10/2007  Job:  [id number]

## 2010-11-25 NOTE — Assessment & Plan Note (Signed)
OFFICE VISIT   CHARLANN, WAYNE  DOB:  22-Jun-1941                                       12/31/2006  ZOXWR#:[id number]   Ms. Sunderlin returns for followup today.  She underwent a right above-knee  amputation on Dec 09, 2006.  Recent home health nurse evaluated the  wound, and she had a small amount of drainage medially.  She presents  today for further evaluation of this.   On exam, her above-knee amputation is healing well.  She has minimal  pain and is using only a few Tramadol per week, as well as her continued  Neurontin use for phantom pain.  On exam, there is a 3-cm area on the  medial aspect that looks like there was some blistering at the skin  edge.  This seems to be healing.  Overall, Ms. Tallman is doing well.  I  prescribed her a shrinker today and renewed her Tramadol prescription  for 40 more tablets today.  She will follow up next week for staple  removal.  Hopefully, this will heal up long term, and we can have her  fitted for a prosthetic leg in the near future.   [name], MD  Electronically Signed   CEF/MEDQ  D:  12/31/2006  T:  01/03/2007  Job:  [id number]   cc:   Gretta Arab. Valentina Lucks, M.D.  Chart

## 2010-11-25 NOTE — Op Note (Signed)
[name], [name]               ACCOUNT NO.:  [id number]   MEDICAL RECORD NO.:  [id number]          PATIENT TYPE:  INP   LOCATION:  2550                         FACILITY:  [facility]   PHYSICIAN:  [name], MD  DATE OF BIRTH:  11-09-1940   DATE OF PROCEDURE:  11/22/2006  DATE OF DISCHARGE:                               OPERATIVE REPORT   PROCEDURE:  Debridement of right below-knee amputation and placement of  V.A.C. dressing.   PREOPERATIVE DIAGNOSIS:  Nonhealing right below-knee amputation.   POSTOPERATIVE DIAGNOSIS:  Nonhealing right below-knee amputation.   ANESTHESIA:  General.   ASSISTANT:  Nurse.   OPERATIVE FINDINGS:  Necrotic tissue and muscle debrided back to  bleeding tissue.   OPERATIVE DETAILS:  After obtaining informed consent, the patient was  taken to the operating room.  The patient was placed in the supine  position on the operating table.  After induction of general anesthesia,  the patient's entire right lower extremity was prepped and draped in the  usual sterile fashion.  The right below-knee amputation had several  sutures that were still in place.  The skin incision was opened.  The  sutures were removed.  The necrotic tissue within the base of the wound  was then sharply debrided back to bleeding tissue.  There was areas of  muscle that were pale in appearance but did have some bleeding.  All  necrotic debris was removed from the wound.  There was some bleeding  from the entire wound at the end of the debridement.  Next, a V.A.C.  dressing was placed over the BKA in usual fashion.  The patient  tolerated the procedure well, and there were no complications.  Instrument, sponge and needle counts were correct at the case.  The  patient was taken to recovery room in stable condition.      [name], MD  Electronically Signed     CEF/MEDQ  D:  11/22/2006  T:  11/22/2006  Job:  [id number]

## 2010-11-28 NOTE — Assessment & Plan Note (Signed)
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  She was in  rehab until September 10, 2006.  She was doing well at home until she  slipped and fell and on the limb from her wheelchair and had some  dehiscence of the wound.  She saw Dr. Darrick Penna who replaced some retention  type sutures in.  She has been receiving wet-to-dry type dressings every  other day.  Pain is still an issue.  She is having a lot of phantom  symptoms particularly at night.  The leg is very sensitive as well  itself.  She is scheduled to see Dr. Darrick Penna back this week for follow-  up.  She is has had Home Health nursing at the house as well as other  family members assisting with her wound.  The patient rates her pain a 6  to 7 out of 10 in general.  Pain interferes with general activity,  relations with others and enjoyment of life on a moderate level.  She  primarily uses her wheelchair for mobility.  She is able to use her  walker for short distances.  She has had no problems with the left foot.   REVIEW OF SYSTEMS:  Notable for the above.  Mood is fair.  She states  that her blood pressure has been under control.   SOCIAL HISTORY:  The patient is living with family who are extremely  supportive.   PHYSICAL EXAMINATION:  VITAL SIGNS:  The patient is afebrile.  Vital  signs are stable.  She is pleasant, alert and oriented x3.  Affect is  bright and appropriate.  Right leg remains sensitive to touch along the  distal tibia as well as the tibial crest along the wound itself. The  wound is open and healing somewhat by secondary intention.  There is a  significant amount of fibronecrotic tissue and scabbing over the  anterolateral portions of the wound.  There is question of how much  tunneling is going on.  The wound seems to be very fragile still.  She  has a packing of dry gauze in the wound line itself which we removed  carefully after wetting the tissue.  Left foot is intact with good  pulses and color.   No signs of skin breakdown are seen.  There is  excellent use of her upper extremities.  Cognitively she is very  appropriate.  HEART:  Regular rate and rhythm  CHEST:  Clear.   ASSESSMENT:  1. 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  2. Hypertension.  3. 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   PLAN:  1. The patient is quite a ways away from prosthetic fitting at this      point.  I would encourage wet-to-dry packing once a day instead of      every other day to help her remove some of the fibronecrotic tissue      and eschar over the wound itself.  She will be following up with      surgery this week.  2. Increased Neurontin to 300 mg in the morning and afternoon and 600      mg at night for phantom symptoms.  3. Refilled oxycodone 5 mg 1 q.4 hours p.r.n. for baseline pain      control.  4. I will see the patient back in about 6 weeks time for re-evaluation      of the wound  for potential prosthetic fitting.      Ranelle Oyster, M.D.  Electronically Signed     ZTS/MedQ  D:  11/02/2006 12:53:28  T:  11/02/2006 13:39:53  Job #:  [id number]   cc:   [name], MD  [address], [place] [address]

## 2010-11-28 NOTE — Op Note (Signed)
[name], [name]               ACCOUNT NO.:  [id number]   MEDICAL RECORD NO.:  [id number]          PATIENT TYPE:  INP   LOCATION:  2021                         FACILITY:  [facility]   PHYSICIAN:  [name], MD  DATE OF BIRTH:  24-Jun-1941   DATE OF PROCEDURE:  09/02/2006  DATE OF DISCHARGE:                               OPERATIVE REPORT   PROCEDURE:  Right below-knee amputation.   PREOPERATIVE DIAGNOSIS:  Rest pain, right foot.   POSTOPERATIVE DIAGNOSIS:  Rest pain, right foot.   ANESTHESIA:  General.   SURGEON:  Charles E. Fields, MD   ASSISTANT:  Zadie Rhine, PA-C   INDICATIONS:  The patient is a 70 year old female who has had multiple  bypasses and revisions in her right lower extremity.  She now presents  with reocclusion of the graft and progressive ischemic changes of the  right lower extremity.  She is now undergoing right below-knee  amputation.   SPECIMEN:  Right leg.   DESCRIPTION OF PROCEDURE:  After obtaining informed consent, the patient  was taken to the operating room.  The patient was placed in the supine  position on the operating table.  After induction of general anesthesia  and endotracheal intubation, the patient's right lower extremity was  prepped and draped in the usual sterile fashion.   A transverse incision was made across the tibia approximately three  fingerbreadths from the tibial tuberosity.  The incision was then  extended longitudinally to create a posterior flap.  The incision was  carried down through the fascial muscle tissues.  The fibula was  dissected free circumferentially.  This was divided with a bone cutter  approximately 3 cm above the skin edge.  The tibia was then dissected  free circumferentially and divided approximately 2 cm above the skin  edge with a saw.  The amputation knife was then used to complete the  amputation and creating a posterior flap.  Hemostasis was then obtained  using suture ligatures.  The  tibial nerve was pulled down into the  operative field, ligated with a Vicryl tie, and then allowed to retract  up in the leg.  The anterior portion of the tibia was beveled with a  rasp.  The wound was thoroughly irrigated with normal saline solution.  The fascial edges were then reapproximated after obtaining hemostasis.  The subcutaneous tissues were reapproximated using running 3-0 Vicryl  suture.  The skin was closed with staples.   The patient tolerated procedure well, and there were no complications.  Instrument, sponge and needle counts were correct at the end of the  case.  The patient was taken to the recovery room in stable condition.      [name], MD  Electronically Signed     CEF/MEDQ  D:  09/02/2006  T:  09/02/2006  Job:  [id number]

## 2010-11-28 NOTE — Discharge Summary (Signed)
[name], [name]               ACCOUNT NO.:  [id number]   MEDICAL RECORD NO.:  [id number]          PATIENT TYPE:  IPS   LOCATION:  4031                         FACILITY:  [facility]   PHYSICIAN:  Ranelle Oyster, M.D.DATE OF BIRTH:  09-Feb-1941   DATE OF ADMISSION:  09/06/2006  DATE OF DISCHARGE:  09/10/2006                               DISCHARGE SUMMARY   DISCHARGE DIAGNOSES:  1. 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  2. Pain control.  3. Hypertension.  4. Anemia.  5. Subcutaneous heparin for deep vein thrombosis prophylaxis.   This is a 70 year old black female with history of peripheral vascular  disease and multiple revascularization procedures, on chronic Coumadin,  who was admitted August 29, 2006, after recent discharge for occluded  right femoral popliteal bypass graft and had undergone thrombolysis.  Presented with ischemic changes right lower extremity and graft again  occluded.  Underwent right below-knee amputation September 02, 2006, per  Dr. Darrick Penna.  PCA discontinued September 05, 2006.  No current plan to  resume Coumadin at this time.  She did remain on aspirin as prior to  hospital admission.  Subcutaneous heparin was initiated for deep vein  thrombosis prophylaxis.   PAST MEDICAL HISTORY:  See discharge diagnoses.  No alcohol.  Remote  smoker.   ALLERGIES:  CODEINE.   SOCIAL HISTORY:  Lives alone, 1-level home, 2 steps to entry.  Local  family works day shifts.   MEDICATIONS PRIOR TO ADMISSION:  1. Vitamin D 400 international units daily.  2. Multivitamin daily.  3. Coumadin 2.5 mg daily.  4. Avalide 150/12.5 daily.  5. Aspirin 81 mg daily.   REHABILITATION HOSPITAL COURSE:  The patient was admitted to inpatient  rehab services with therapies initiated on a 3-hour daily basis  consisting of physical therapy, occupational therapy and rehabilitation  nursing.  The following issues were addressed  during the patient's  rehabilitation stay.  Pertaining to [name] right below-knee  amputation September 02, 2006, surgical site healing nicely, dressing  remained clean and dry.  She did have some ongoing phantom pain.  She  was using oxycodone for breakthrough pain as well as the addition of  OxyContin sustained release 10 mg every 12 hours and Neurontin 100 mg 3  times daily.  Blood pressures controlled with Avapro and Lopressor.  She  would resume her Avalide on discharge.  Postoperative anemia stable.  Latest hemoglobin 9.6.  She was on iron supplement.  She had no bowel or  bladder disturbances.  Overall for her functional status, she was  minimal assist for lower body activities daily of living, supervision  upper body in the sitting position, she required minimal assist for  transfers.  Overall her strength and endurance greatly improved.  She  was encouraged with overall progress and ultimate plan was to be  discharged to home.   Latest labs showed a sodium 72, potassium 4, BUN 0.5, hemoglobin 96,  hematocrit 28, platelet 314,000.   DISCHARGE MEDICATIONS:  At time of dictation included:  1. Aspirin 81 mg daily.  2. Vitamin D 400 units daily.  3. Avalide 150/12.5 daily.  4. Neurontin 100 mg 3 times daily.  5. Trinsicon twice daily.  6. OxyContin sustained release 10 mg every 12 hours for a total of 2      weeks, dispense 14 tablets.  7. Oxycodone immediate release 5 mg 1-2 tablets every 4 hours as      needed for, pain dispense 90 tablets.   DIET:  Regular.   FOLLOWUP:  1. She would follow up Dr. Riley Kill at the outpatient rehab amputee      clinic.  2. Dr. Darrick Penna, cardiothoracic surgery, 2 weeks.  3. Dr. Maurice Small, medical management.      Mariam Dollar, P.A.      Ranelle Oyster, M.D.  Electronically Signed    DA/MEDQ  D:  09/09/2006  T:  09/09/2006  Job:  [id number]   cc:   Ranelle Oyster, M.D.  Janetta Hora. Darrick Penna, MD  Gretta Arab Valentina Lucks,  M.D.

## 2010-11-28 NOTE — H&P (Signed)
[name], [name]               ACCOUNT NO.:  [id number]   MEDICAL RECORD NO.:  [id number]           PATIENT TYPE:   LOCATION:                               FACILITY:  [facility]   PHYSICIAN:  Di Kindle. Edilia Bo, M.D.DATE OF BIRTH:  1941/03/02   DATE OF ADMISSION:  06/01/2006  DATE OF DISCHARGE:                              HISTORY & PHYSICAL   REASON FOR ADMISSION:  Occluded right fem-pop bypass graft.   HISTORY:  This is a pleasant 70 year old woman who had undergone a right  femoral to above-knee popliteal artery bypass graft by Dr. Darrick Penna on  January 20, 2006.  At the time of the procedure, the vein became quite  small in size and was not usable for a bypass conduit.  Therefore, the  graft was done with PTFE.  The graft clotted in September and the  patient underwent thrombectomy of her graft with revision of the distal  aspect of the graft by jumping lower on the above-knee popliteal artery  with interposition 6 mm PTFE graft.  Completion arteriogram, which are  reviewed, at that time looks fine with no problems identified.  Approximately 2 weeks ago the patient began developing pain in the right  leg again with rest pain.  This has gradually progressed in 2 days ago  arm symptoms became worse.  She was seen in the office today where ABI  was markedly diminished on the right and she was apparently felt to have  an occluded fem-pop bypass graft.  She was set up to have an arteriogram  next week by Dr. Darrick Penna and consideration for a redo bypass.  However,  her symptoms progressed today and she presented to the emergency  department further evaluation.   PAST MEDICAL HISTORY:  1. Hypertension.  2. Hyperlipidemia.  3. She denies any history of diabetes, history of previous myocardial      infarction or history of congestive heart failure.   PAST SURGICAL HISTORY:  1. Right fem-pop bypass graft and thrombectomy revision of graft as      described above.  2. Total abdominal  hysterectomy.   ALLERGIES:  Codeine.   MEDICATIONS:  1. Aspirin 325 mg p.o. daily.  2. Avalide 150/12.5 mg p.o. daily.  3. Lipitor 40 mg p.o. daily.   REVIEW OF SYSTEMS:  She has had no history of stroke, TIAs, expressive  or receptive aphasia.  She has had rest pain in her right leg now for 2  weeks.  She has paresthesias in the right leg.  She had no nonhealing  ulcers.  She has had no chest pain, chest pressure, palpitations or  arrhythmias.  Review of systems is otherwise unremarkable.   SOCIAL HISTORY:  She quit tobacco in May of this year.  She had smoked a  pack per day for 30 years.   FAMILY HISTORY:  There is no history of premature cardiovascular  disease.   PHYSICAL EXAMINATION:  VITAL SIGNS:  Blood pressure 129/78, heart rate  is 60.  HEENT:  I do not detect any carotid bruits.  LUNGS:  Clear bilaterally to auscultation.  CARDIAC:  She has a regular rate and rhythm.  ABDOMEN:  Soft and nontender.  She has excellent Doppler flow in the  right groin with a palpable left femoral pulse.  EXTREMITIES:  She has no Doppler flow in the right foot.  Motor function  is intact, slightly decreased sensation on the right, on the left side  she is a palpable dorsalis pedis pulse.   IMPRESSION:  This patient presents with an occluded right fem-pop bypass  graft.  This graft was placed in June and now this has occluded a second  time.  On a most recent thrombectomy revision, the completion  arteriogram looked fine with really no technical problems.  I did not  see any way to revise his graft to increase his long-term patency.  My  recommendation would be attempted thrombolysis of the graft and if  successful, place her on Coumadin postoperative.  If this is not  successful, we would need to consider surgical thrombectomy.  I have  discussed this with the patient and she is agreeable.  We will place her  on heparin tonight with plans for thrombolysis of her graft tomorrow for   interventional radiology.      Di Kindle. Edilia Bo, M.D.  Electronically Signed     CSD/MEDQ  D:  06/01/2006  T:  06/02/2006  Job:  [id number]

## 2010-11-28 NOTE — Discharge Summary (Signed)
NAMESYANNA, [name]               ACCOUNT NO.:  [id number]   MEDICAL RECORD NO.:  [id number]          PATIENT TYPE:  INP   LOCATION:  2017                         FACILITY:  [facility]   PHYSICIAN:  [name], MD  DATE OF BIRTH:  1940/07/21   DATE OF ADMISSION:  08/04/2006  DATE OF DISCHARGE:                               DISCHARGE SUMMARY   CHIEF COMPLAINT:  Occluded right femoral-to-popliteal bypass graft.   IN-HOSPITAL DIAGNOSES.:  1. Hematoma.  2. Acute blood loss anemia.   SECONDARY DIAGNOSES:  1. Hypertension.  2. Hyperlipidemia.  3. History of severe peripheral vascular disease status post multiple      revascularization surgeries.  4. Status post total abdominal hysterectomy.   OPERATIONS AND PROCEDURES:  1. Thrombolysis of right femoral-to-popliteal bypass graft done by      radiology.   THE PATIENT'S HISTORY AND PHYSICAL IN HOSPITAL COURSE:  The patient is a  70 year old African-American female who is well known to [facility].  The  patient has a history of peripheral vascular occlusive disease.  She had  undergone right -femoral-to-popliteal bypass grafting with a right  femoral endarterectomy in June2007.  This was followed by thrombectomy  of right femoral-to-popliteal revision using Propatent in September2007.  The patient had developed occlusion of right femoral-popliteal bypass  which required redo right-femoral-to-below-the-knee-popliteal  November2007.  During this time, the patient has undergone thrombolysis  x2.  She presented to the office on January23,2008 and was seen and  evaluated by Dr. Edilia Bo.  The patient complained of right foot pain  since Saturday.  Graft was noted to be occluded.  Dr. Edilia Bo discussed  with the patient transferring to the emergency room and undergoing  thrombolysis by radiology.  Risks and benefits were discussed.  The  patient acknowledged her understanding and agreed to proceed.  For  details of the patient's past medical  history and physical exam, please  see dictated History and Physical.   The patient was admitted to [facility] on January23,2008.  She  was taken directly to radiology where they initiated thrombolysis.  Following thrombolysis, the patient was admitted to 2300.  Following  day, noted right foot numb to right upper leg and cool to touch.  There  were no palpable pulses.  The patient was taken back for followup right  lower extremity arteriogram on January24.  This showed still large  plaque or thrombus at the runoff.  Thrombolysis was continued.  Following further thrombolysis, the patient was taken back that  afternoon to radiology for a right lower extremity arteriogram.  This  showed the patient had good antegrade flow in graft and anterior tibial  flow across the right anterior tibial.  There was good flow across the  foot.  At that time, plan was to pull sheath.  The patient was then sent  back to 2300.  She was continued on heparin IV.  The evening of January  24, noted left lower quadrant tenderness and swelling.  There was no  heme at that time noted in the groin.  The patient was taken down to CT  scan  which showed a left abdominal wall hematoma, questionably  spontaneous.  It was felt that the hematoma was stable.  To units of  packed red blood cells ordered at that time.  Early morning of January  25, the patient became hypotensive.  Blood pressure improved with a  fluid bolus.  Hemoglobin was stable at 9.9, 29.9%.  Left lower quadrant  hematoma was stable.  The patient was noted to have right dopperable  anterior tibial signal.  Bilateral feet were warm.  Graft was noted to  be patent.  The patient's hemoglobin and hematocrit were monitored  closely.  They remained stable over next several days.  The patient did  receive one more packed red blood cell during that time.  By [id number],  hemoglobin was 10.8.  The patient was continued on IV heparin, and  Coumadin was  started on January 27.  Daily PT/INR were obtained.  The  patient's left lower quadrant hematoma remained stable with initiation  of Coumadin and proceeding with heparin.  Prior to discharge home, the  patient's INR was therapeutic at 2.0-3.0.  The patient was out of bed  and ambulating well prior to discharge.  Her graft was patent with  bilateral warm feet and good dopperable distal pulses in the right prior  to discharge.  Vital signs were stable.  She was afebrile, saturating  greater than 90% on room air.  The patient was tolerating diet well.  No  nausea, vomiting noted.   The patient is tentatively ready for discharge home in the next 1-2 days  pending her INR level.   FOLLOWUP APPOINTMENTS:  Follow-up appointment has been arranged with Dr.  Darrick Penna for February22,2008, at 1:00 p.m..  The patient will need to  follow up with her primary care doctor to obtain PT/INR level 2 days  post discharge.   ACTIVITY:  The patient was instructed no driving until released to do  so, no lifting over 10 pounds.  She is told to ambulate 3-4 times per  day, progress as tolerated, and to continue her breathing exercises.   DIET:  The patient was educated on diet to be low-fat, low-salt.   WOUND CARE:  The patient was told to monitor her left lower quadrant.  If this area becomes increased in size, she is to contact the office.   DISCHARGE MEDICATIONS:  1. Aspirin 81 mg daily.  2. Multivitamin daily.  3. Avalide 150/2.5 mg daily.  4. Vitamin D 400 units daily.  5. Tylox 1-2 tablets q.4-6 h p.r.n. pain.  6. Coumadin will be dosed prior to the patient's discharge PT/INR      level.      [name], PA      [name], MD  Electronically Signed    KMD/MEDQ  D:  08/10/2006  T:  08/10/2006  Job:  [id number]

## 2010-11-28 NOTE — Op Note (Signed)
[name], [name]               ACCOUNT NO.:  [id number]   MEDICAL RECORD NO.:  [id number]          PATIENT TYPE:  INP   LOCATION:  2550                         FACILITY:  [facility]   PHYSICIAN:  [name], MD  DATE OF BIRTH:  01/23/1941   DATE OF PROCEDURE:  12/21/2005  DATE OF DISCHARGE:                                 OPERATIVE REPORT   PROCEDURES:  1.  Right femoral endarterectomy with vein patch angioplasty.  2.  Right femoral to above-knee popliteal bypass with 6-mm      polytetrafluoroethylene.  3.  Intraoperative arteriogram.   PREOPERATIVE DIAGNOSIS:  Rest pain, right foot, with claudication.   POSTOPERATIVE DIAGNOSIS:  Rest pain, right foot, with claudication.   ANESTHESIA:  General.   ASSISTANT:  Coral Ceo, PA-C.   INDICATIONS:  The patient is a 70 year old female with history of rest pain,  numbness and tingling in her right foot.  She has short-distance  claudication.  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   OPERATIVE FINDINGS:  1.  6-mm PTFE.  2.  Vein patch of right common femoral artery.   OPERATIVE DETAILS:  After obtaining informed consent, the patient was taken  to the operating room.  The patient was placed in supine position on the  operating table.  After induction of general anesthesia and endotracheal  intubation, a Foley catheter was placed.  Next, both lower extremities were  prepped and draped in the usual sterile fashion.  A longitudinal incision  was made over the right common femoral artery.  The incision was carried  down through the subcutaneous tissues down to the level of the femoral  artery.  This was dissected free circumferentially.  Small side branches  were ligated with and divided between silk ties.  The profunda femoris and  superficial femoral arteries were dissected free circumferentially and  controlled with vessel  loops.  Next the saphenous vein was dissected free in  the medial portion of the incision down the level of the saphenofemoral  junction.  The vein was exposed through several skip incisions down the leg.  The vein became quite small in caliber approximately 5 cm from the  saphenofemoral junction.  It was essentially unusable past this distance.  The vein was harvested down to the level of the below-knee popliteal area,  and the below-knee popliteal artery was exposed in this area with the intent  to do a below-knee popliteal bypass.  However, with the vein quality being  so poor it was decided that an above-knee popliteal bypass would be done  with PTFE material.  Therefore the above-knee popliteal artery was exposed  as low as possible and dissected free circumferentially and controlled with  vessel loops.  Next a 6-mm PTFE graft was brought up in the operative field  and tunneled deep to the sartorius muscle.  The patient was then given 5000  units of intravenous heparin.  The right common femoral artery was clamped  proximally with a Cooley clamp and controlled distally with vessel  loops.  A  longitudinal arteriotomy was then performed.  There was a high-grade  stenosis of the distal common femoral artery.  A right femoral  endarterectomy was performed and good end points were obtained at the origin  of the superficial femoral and profunda femoris arteries.  Next the portion  of greater saphenous vein that had been harvested up to the saphenofemoral  junction was brought up in the operative field and opened longitudinally to  use as a vein patch.  This was then sewn on as a vein patch angioplasty  using a running 5-0 Prolene suture.  At completion of the anastomosis, this  was fore bled, back bled and thoroughly flushed.  A longitudinal arteriotomy  was then made in the hood of the patch and the 6-mm PTFE graft beveled.  It  was then sewn end graft to side of patch using a running 5-0  Prolene suture.  At completion of the anastomosis, everything was fore bled, back bled and  thoroughly flushed and the graft was clamped just at the origin of the  anastomosis.  Thrombin and Gelfoam was then used to obtain hemostasis.  The  graft was pulled taut to length.  The above-knee popliteal artery was  controlled proximally and distally with Henley clamps.  A longitudinal  arteriotomy was made.  The graft was beveled.  The graft was then sewn end  of graft to side of artery using a running 6-0 Prolene suture.  Just prior  to completion the anastomosis was fore bled, back bled and thoroughly  flushed.  The anastomosis was secured, clamps were released.  There was a  palpable pulse in the below-knee popliteal artery immediately.  The patient  had a palpable dorsalis pedis pulse at the end of the case.  Next, an  intraoperative arteriogram was obtained, introducing the needle just above  the above-knee popliteal anastomosis.  Arteriogram showed a patent distal  popliteal artery with three-vessel runoff to the foot.  The anastomosis was  not visualized as it was off the top of the plate of the film.  Repeat  arteriogram was not performed.   There were good Doppler signals in the dorsalis pedis and posterior tibial  arteries.  Next, hemostasis was obtained in all incisions.  It should be  noted that the greater saphenous vein was removed down to the level of the  knee.  It is quite small and less than 2 mm in diameter below this.  All  incisions were closed with multiple layers of Vicryl suture in the  subcutaneous layers.  The skin of all incisions was then closed with  staples.  The patient tolerated the procedure well and there were no  complications.  Instrument, sponge and needle count were correct at the end  of the case.  The patient was taken to the recovery room in stable  condition.      [name], MD  Electronically Signed    CEF/MEDQ  D:  12/21/2005  T:   12/21/2005  Job:  [id number]

## 2010-11-28 NOTE — Discharge Summary (Signed)
[name], [name]               ACCOUNT NO.:  [id number]   MEDICAL RECORD NO.:  [id number]          PATIENT TYPE:  INP   LOCATION:  2021                         FACILITY:  [facility]   PHYSICIAN:  [name], MD  DATE OF BIRTH:  January 15, 1941   DATE OF ADMISSION:  10/02/2006  DATE OF DISCHARGE:                               DISCHARGE SUMMARY   ADMISSION DIAGNOSIS:  Dehiscence of right below knee amputation.   DISCHARGE DIAGNOSES:  1. Dehiscence of right below knee amputation, status post closure.  2. Hypertension.  3. Peripheral vascular occlusive disease.   PROCEDURES:  On October 02, 2006, the patient underwent closure of the  right below the knee amputation by Dr. Fabienne Bruns.   HISTORY AND PHYSICAL:  This is 70 year old female that is one month  status post right below knee amputation.  She fell on her stump today  and had to dehiscence of her incision.  The patient presented to [facility] ER.   HOSPITAL COURSE:  On admission, the patient's wound was completely  dehisced.  Trauma was isolated to the leg with minimal blood loss.  She  had viable deep tissues.  The patient was taken to the OR by Dr. Darrick Penna  who closed the right below the knee amputation.  The patient underwent  this procedure without any complications.  Postoperatively, the patient  was placed on PCA morphine for pain control.  The patient remained on  her PCA until postop day #2, and this was discontinued.  She will stay  overnight for pain control and see how she does on p.o. medications.   The patient has remained afebrile.  Her vital signs have remained  stable.  She has remained hemodynamically stable.  Hemoglobin 9.5,  hematocrit 29.6.  White blood cell count 6.9.  She did receive IV  antibiotics, Ancef, while in the hospital.  She will go home on Keflex  for a week.  The patient's right BKA incisions remain clear, dry, and  intact, and the edges are approximated.   DISCHARGE CONDITION:  The  patient will be discharged home in stable  condition.   MEDICATIONS:  1. Tylox 1-2 tab every 4 hours p.r.n.  2. Aspirin 81 mg two daily.  3. Avalide 150/12.5 mg two daily.  4. Centrum silver daily.  5. __________  400 mg daily.  6. Neurontin 100 mg p.o. t.i.d.  7. Keflex 500 mg p.o. t.i.d. times 7 days.   DISCHARGE INSTRUCTIONS:  The patient is instructed to follow a low-fat,  low-salt diet.  She may clean her incisions with mild soap and water.  She may shower.  No heavy lifting for 3 weeks.  The patient is to call  with any problems that should arise.   FOLLOWUP:  The patient will follow up with Dr. Darrick Penna in 2 weeks.  The  office will contact her with time of the appointment.      [name], PA      [name], MD  Electronically Signed    JMW/MEDQ  D:  10/04/2006  T:  10/04/2006  Job:  [id number] 

## 2010-11-28 NOTE — Discharge Summary (Signed)
[name], [name]               ACCOUNT NO.:  [id number]   MEDICAL RECORD NO.:  [id number]          PATIENT TYPE:  INP   LOCATION:  2021                         FACILITY:  [facility]   PHYSICIAN:  Larina Earthly, M.D.    DATE OF BIRTH:  01/26/1941   DATE OF ADMISSION:  08/29/2006  DATE OF DISCHARGE:                               DISCHARGE SUMMARY   PRIMARY DIAGNOSIS:  Rest pain, right foot.   IN-HOSPITAL DIAGNOSIS:  Urinary tract infection.   SECONDARY DIAGNOSES:  1. Peripheral vascular disease status post multiple revascularization      surgeries.  2. Hypertension.  3. Hyperlipidemia.  4. Status post total abdominal hysterectomy.  5. History of hematoma, left lower quadrant.   IN-HOSPITAL OPERATIONS AND PROCEDURES:  Right below-knee amputation.   PATIENT'S HISTORY AND PHYSICAL AND HOSPITAL COURSE:  The patient is a  pleasant 70 year old African-American female who has had multiple  bypasses and revisions in her right lower extremity. She presented to  the emergency room August 29, 2006 complaining of increased pain,  right lower extremity. She was recently discharge from [facility]  August 10, 2006, when on discharge noted __________ popliteal bypass  graft. The patient was seen in and evaluated in the ER. It was felt to  admit patient for pain control and then undergo potential amputation  during this hospitalization. She was seen and evaluated by Dr. Arbie Cookey.  The patient is admitted to [facility] August 29, 2006. For details of  the patient's past medical history and physical exam, please see  dictated history and physical.   On admission, the patient underwent CT of the abdomen with complaints of  increased pain in the left lower quadrant with increased swelling. She  does note a history of a hematoma in the left lower quadrant. This  showed decreased left abdominopelvic wall hematoma with no acute  abnormalities. The patient was started on PCA for pain control. She  did  improvement with it. She was seen and evaluated by Dr. Darrick Penna. Dr.  Darrick Penna discussed with her and her family in great detail the need and  reason for undergoing a right below-knee amputation. Risks and benefits  were discussed for the procedure. The patient acknowledged her  understanding and agreed to proceed. Surgery was scheduled for February  21. Prior to undergoing surgery, urinalysis was obtained showing small  leukocytes and many bacteria. She was started on antibiotics by IV. The  patient remained stable prior to surgery.   The patient was taken to the OR September 02, 2006 where she underwent  right below-knee amputation. The patient tolerated this procedure and  was readmitted to 2000. The patient's postoperative course was pretty  much unremarkable. Pain was well controlled. She was noted to be  hemodynamically stable with hemoglobin of 9.8 and hematocrit of 29.1.  Physical therapy and occupational therapy will consulted. Rehab saw and  evaluated patient and agreed that patient was appropriate for inpatient  rehab. The patient's vital signs were monitored postoperatively and  seemed to be stable. She was saturating greater than 90% on room air.  She remained in  normal sinus rhythm. Pulmonary status was stable.  Incisions were clean, dry, and intact and healing well.   The patient was hemodynamically stable for rehab placement in the next 1  to 2 days.   FOLLOWUP APPOINTMENT:  Our office will contact the patient with a  followup appointment to see Dr. Darrick Penna in 4 weeks. She will have her  staples discontinued at that time as well.   INCISIONAL CARE:  The patient is told she is allowed to shower, washing  her incision using soap and water. She is to contact the office if she  develops any drainage or opening from any of her incision sites.   ACTIVITY:  Physical therapy, occupational therapy to continue working  with patient.   DISCHARGE MEDICATIONS:  1. Aspirin 81 mg  daily.  2. Multivitamin daily.  3. Avalide 50/12.5 mg daily.  4. Vitamin D 400 units daily.  5. Lopressor 12.5 mg b.i.d.  6. Tylox 1 to 2 tablets q.4-6h. p.r.n. pain.  7. Tylenol 650 one to two tablets q.4-6h. p.r.n. pain.      [name], [place]      Larina Earthly, M.D.  Electronically Signed    KMD/MEDQ  D:  09/03/2006  T:  09/03/2006  Job:  [id number]   cc:   Larina Earthly, M.D.

## 2010-11-28 NOTE — Op Note (Signed)
[name], [name]               ACCOUNT NO.:  [id number]   MEDICAL RECORD NO.:  [id number]          PATIENT TYPE:  INP   LOCATION:  2021                         FACILITY:  [facility]   PHYSICIAN:  [name], MD  DATE OF BIRTH:  [birth year]   DATE OF PROCEDURE:  10/02/2006  DATE OF DISCHARGE:                               OPERATIVE REPORT   PROCEDURE:  Closure of right below-knee amputation.   PREOPERATIVE DIAGNOSIS:  Dehiscence right below-knee amputation.   POSTOPERATIVE DIAGNOSIS:  Dehiscence right below-knee amputation.   ANESTHESIA:  Local.   INDICATIONS:  The patient is a 70 year old female who is 1 month status  post right below-knee amputation.  She fell on her stump today and had  dehiscence of the incisions.   OPERATIVE DETAILS:  After obtaining informed consent, the patient was  taken to the operating.  The patient was placed in the supine position  on the operating table.  Sterile prep and drape was performed to the  right below-knee amputation.  Local anesthesia and a mixture of 1/4%  Marcaine and 1% local was injected into the subcutaneous tissues.  The  wound was thoroughly irrigated with warm normal saline solution.  The  wound was clean in appearance.  The skin edges were then reapproximated  using interrupted 2-0 vertical mattress and simple sutures.  The patient  tolerated the procedure well and there were complications.  The patient  was taken to recovery room in stable condition.  Instrument, sponge, and  needle counts were correct at the end of the case.      [name], MD  Electronically Signed     CEF/MEDQ  D:  10/02/2006  T:  10/03/2006  Job:  [id number]

## 2010-11-28 NOTE — H&P (Signed)
[name], [name]               ACCOUNT NO.:  [id number]   MEDICAL RECORD NO.:  [id number]            PATIENT TYPE:   LOCATION:                                 FACILITY:   PHYSICIAN:  Larina Earthly, M.D.    DATE OF BIRTH:  08/29/2008   DATE OF ADMISSION:  DATE OF DISCHARGE:                              HISTORY & PHYSICAL   CHIEF COMPLAINT:  Right femoral to popliteal bypass graft.   HISTORY OF PRESENT ILLNESS:  [name] is a 70 year old female who is  well known to [facility].  She was recently discharged from the hospital on  August 10, 2006 where she had undergone thrombolysis of occluded right  femoral popliteal bypass graft during her hospitalization.  At discharge  the patient's femoral-to-popliteal bypass was noted to be reoccluded.  She was initiated on Coumadin prior to discharge home.  At time of  discharge it was discussed with the patient pain control, and possible  amputation in the future.  Patient has past medical history of  hypertension and hyperlipidemia.   She contacted Dr. Arbie Cookey this morning with complaints of increasing pain  in the right lower extremity.  States she has finished off her pain med  prescription over the weekend.  The patient at that time told to present  to the emergency department.   The patient in the emergency department complains of increasing pain.  right lower extremity.  Again, states that she has used her whole  prescription of pain meds over the weekend with no improvement.  Notes  skin discoloration right lower extremity.  No ulcerations or tingling  noted.  The patient states unable to ambulate.  She also complains of  left lower quadrant mass which she was diagnosed with a hematoma prior  to discharge on August 10, 2006, increasing in size.  Also complains of  extreme tenderness.   PAST MEDICAL/SURGICAL HISTORY:  1. Peripheral vascular occlusive disease, status post multiple      revascularization surgeries.  2. Hypertension.  3. Hyperlipidemia.  4. Status post total abdominal hysterectomy.  5. History of hematoma left lower quadrant.   ALLERGIES:  The patient states allergic to CODEINE.   MEDICATIONS:  1. Aspirin 81 mg daily.  2. Multivitamin daily.  3. Avalide 100 12.5 mg daily.  4. Vitamin D 400 units daily.  5. Coumadin 2.5 mg at night.  6. Tylox 1-2 tabs q.4-6 h. p.r.n. pain.   FAMILY HISTORY:  Noncontributory.   SOCIAL HISTORY:  The patient has a history of tobacco use which she  currently has quit.  She is single, lives at home by herself.  Denies  any alcohol use.   REVIEW OF SYSTEMS:  See HPI for pertinent positives and negatives.  The  patient denies any recent fevers, night sweats, or chills.  She denies  any recent changes in vision, hearing, or difficulty swallowing.  The  patient denies any chest pain, palpitations, lightheadedness, dizziness,  orthopnea, paroxysmal nocturnal dyspnea.  The patient denies any  shortness of breath, cough, hemoptysis, or wheezing.  She denies any  nausea, vomiting.  Does complain of left lower quadrant mass increasing  in size.  GENITOURINARY:  Deferred.  RECTAL:  Deferred.  EXTREMITIES:  See HPI.   PHYSICAL EXAM:  GENERAL:  A well-developed, well-nourished African-  American female in no acute distress.  VITAL SIGNS:  Not available.  HEENT: Normocephalic, atraumatic.  Pupils equal, round, reactive to  light and accommodation.  Extraocular movements intact.  Oral mucosa is  pink and moist.  NECK:  Supple.  RESPIRATORY:  Clear to auscultation bilaterally.  CARDIAC:  Regular rate and rhythm.  S1-S2 noted.  ABDOMEN:  Bowel sounds x4.  She does have a left lower quadrant mass  tender to touch.  GENITOURINARY:  Deferred.  RECTAL:  Deferred.  EXTREMITIES:  The patient's right lower extremity toes are cold to  touch.  Foot is warm.  There are no palpable bilateral lower extremity  pulses noted.  NEUROLOGIC:  Cranial nerves II-XII intact.  Patient is alert  and  oriented x4.   IMPRESSION AND PLAN:  The patient is seen with a right femoral-popliteal  occluded bypass graft.  Inadequate pain control at home.  She is seen  and evaluated by Dr. Arbie Cookey.  Dr. Arbie Cookey discussed with patient admitting  to [facility] for pain control.  The patient also noted  increasing size of left lower quadrant mass.  Will obtain a CT scan  without contrast for evaluation of old hematoma.  The patient will be  admitted to [facility], 2000.  Will obtain routine lab work.  Will restart her on all her medications.      [name], [place]      Larina Earthly, M.D.  Electronically Signed    KMD/MEDQ  D:  08/29/2006  T:  08/29/2006  Job:  [id number]   cc:   Larina Earthly, M.D.

## 2010-11-28 NOTE — Op Note (Signed)
[name], [name]               ACCOUNT NO.:  [id number]   MEDICAL RECORD NO.:  [id number]          PATIENT TYPE:  INP   LOCATION:  2035                         FACILITY:  [facility]   PHYSICIAN:  [name], MD  DATE OF BIRTH:  [birth year]   DATE OF PROCEDURE:  03/17/2006  DATE OF DISCHARGE:                                 OPERATIVE REPORT   PROCEDURE:  1. Thrombectomy of right femoral-popliteal bypass.  2. Revision with 6 mm interposition PTFE graft.  3. Interoperative arteriogram   PREOPERATIVE DIAGNOSIS:  Ischemia, right foot with occluded right fem-pop.   POSTOPERATIVE DIAGNOSIS:  Ischemia, right foot with occluded right fem-pop.   ANESTHESIA:  General.   SURGEON:  Charles E. Fields, M.D.   ASSISTANT:  Stephanie Acre Dominick, P.A.-C.   OPERATIVE FINDINGS:  1. Occluded right femoral-popliteal bypass.  2. Atherosclerotic plaque on posterior wall of distal anastomosis as well      as intimal hyperplasia.  3. Completion arteriogram shows two vessel runoff via posterior tibial      artery and anterior tibial artery.   OPERATIVE DETAILS:  After obtaining informed consent, the patient was taken  to the operating room.  The patient was placed in the supine position on the  operating table.  After induction of general anesthesia and endotracheal  intubation, the patient's right lower extremity was prepped and draped in  the usual sterile fashion.  A pre-existing longitudinal scar was reopened  just above the knee.  The incision was carried down through subcutaneous  tissues.  There was significant edema fluid within the tissues.  The  incision was carried down through the subcutaneous tissues down to the level  of the sartorius muscle.  This was then reflected laterally.  The pre-  existing 6-mm PTFE graft was dissected free circumferentially.  Dissection  was carried down to the level of the popliteal artery.  There was dense scar  around this and this was taken down with  tedious dissection.  Several small  holes were made in the popliteal vein and these were repaired with  interrupted 5-0 Prolene sutures.  The popliteal artery was controlled above  and below the level of the anastomosis.  The popliteal artery was then  dissected free circumferentially approximately 3 cm below the pre-existing  anastomosis.  A transverse graftotomy was then made just above the  anastomosis.  A #4 Fogarty catheter was used to thrombectomized the fem-pop  bypass graft and there was good arterial inflow were achieved.  The catheter  was passed multiple times until all thrombotic material was removed and a  clean pass was obtained.  The patient was given 5000 units of heparin at  this time.  The graft was then clamped proximally.  The patient was given an  additional 2000 units heparin during the case.  Next, the graft was opened  at the level of the anastomosis.  There was a large amount of posterior  plaque which had built up along the posterior wall of the anastomosis.  There was also intimal hyperplasia.  Therefore, the anastomosis was taken  down.  The artery was occluded proximally and this was ligated with a 2-0  silk tie.  The distal popliteal artery was then spatulated to a more  suitable segment still on the above knee portion anatomically.  A new 6-mm  PTFE graft was brought up on the operative field and this was spatulated and  sewn end-to-end to the popliteal artery using a running 6-0 Prolene suture.  At completion of the anastomosis, this was thoroughly flushed with  heparinized saline and allowed to back bleed.  This was then reclamped.  Next, the graft was pulled taut to length and transected.  It was then sewn  end-to-end to the proximal portion of the old bypass graft using running 6-0  Prolene suture.  Just prior to completion of the anastomosis, this was  forward bled, back bled, and thoroughly flushed.  The anastomosis was  secured, clamps released, there  was a palpable pulse below the anastomosis  immediately.  The patient also had good Doppler signals in the dorsalis  pedis and posterior tibial artery.  Next, a completion arteriogram was  obtained with inflow occlusion.  A 21 gauge butterfly needle was introduced  into the graft and with inflow occlusion, arteriogram was obtained.  This  showed two-vessel runoff via the anterior and posterior tibial arteries.  The peroneal artery is occluded in the mid calf.  The anastomosis was widely  patent.   Next, hemostasis was obtained.  The deep layers were closed with running 2-0  Vicryl suture.  The skin was closed with staples.  The patient tolerated the  procedure well and there were complications.  Instrument, sponge, and needle  counts were correct at the end of the case.  The patient had a palpable  dorsalis pedis pulse at the end of the case.  The patient was taken to the  recovery room in stable condition.      [name], MD  Electronically Signed     CEF/MEDQ  D:  03/18/2006  T:  03/18/2006  Job:  [id number]

## 2010-11-28 NOTE — Discharge Summary (Signed)
[name], Fields               ACCOUNT NO.:  [id number]   MEDICAL RECORD NO.:  [id number]          PATIENT TYPE:  INP   LOCATION:  2035                         FACILITY:  [facility]   PHYSICIAN:  [name], MD  DATE OF BIRTH:  07-15-1940   DATE OF ADMISSION:  03/12/2006  DATE OF DISCHARGE:  03/22/2006                                 DISCHARGE SUMMARY   PRIMARY ADMITTING DIAGNOSIS:  1. Ischemic right lower extremity.  2. Occluded right femoral popliteal bypass graft.   ADDITIONAL/DISCHARGE DIAGNOSES:  1. Ischemic right lower extremity.  2. Occluded right femoral popliteal bypass graft.  3. History of peripheral vascular occlusive disease.  4. Hypertension.  5. Hyperlipidemia.   PROCEDURE PERFORMED:  1. Thrombectomy of right femoral popliteal bypass.  2. Revision with 6 mm interposition PTFE graft.  3. Intraoperative arteriogram.   HISTORY OF PRESENT ILLNESS:  The patient is a 70 year old female with a  history of peripheral vascular occlusive disease who is status post previous  right popliteal bypass by Dr. Darrick Penna, in June 2007.  She recently began to  experience right lower extremity pain and was seen in our office for lower  extremity Doppler studies, which revealed an occluded right femoral  popliteal bypass graft and significantly diminished right ankle brachial  index, now at 0.19 from a previous 1.0.  She was subsequently admitted to  [facility] for further evaluation and treatment.   HOSPITAL COURSE:  [name] was admitted on March 12, 2006.  She underwent  an arteriogram of the right lower extremity by Interventional Radiology  which confirmed occlusion of the right femoral popliteal bypass graft.  Initially, an attempt was made to perform thrombolysis to the graft;  however, this was unsuccessful.  A repeat angiogram showed some areas of  residual clot and diffuse narrowing at the distal anastomosis of her bypass.  She continued to have some  numbness and discomfort in her right foot, and it  was felt that she should undergo a thrombectomy in the operating room.  She  was taken to the OR on March 17, 2006, and underwent thrombectomy and  revision of her right femoral popliteal bypass graft as described in detail  above, performed by Dr. Darrick Penna.  She tolerated the procedure well and was  transferred to the floor in stable condition.  Postoperatively, she has done  fairly well.  Initially, she had some drainage from her right leg incision  and developed an acute blood loss anemia with a hemoglobin of 6.7.  She  required transfusion of packed red blood cells.  Since that time, her  bleeding has slowed down significantly.  Her foot and leg have remained well  perfused with a 2+ palpable dorsalis pedis and/or posterior tibial pulse.  Her blood counts have remained fairly stable following transfusion, with her  most recent hemoglobin of 9.3, white blood cell count 10.6 and platelets  139,000.  Her ankle brachial index on the right has improved to 0.7 postop.  She has been ambulating in the halls with physical therapy assistance.  She  will have repeat CBC on  the morning of March 22, 2006.  She has remained  afebrile, and all vital signs have been stable.  It is felt that if she  continues to remain stable and her blood work looks okay, and she is  otherwise doing well on morning rounds, she may be discharged home on  March 22, 2006.   </DISCHARGE MEDICATIONS   1. Aspirin 3-25 mg daily.  2. Lipitor 40 mg daily.  3. Avalide of 150/12.5 daily.  4. Tylox  1-2 q.4 h p.r.n. for pain.   DISCHARGE INSTRUCTIONS:  She is asked to refrain from driving, heavy lifting  or strenuous activity.  She may continue ambulating daily and using her  incentive spirometer.  She may continue her same preoperative diet.   DISCHARGE FOLLOWUP:  Home Health physical therapy has been arranged.  She  will see Dr. Darrick Penna back in the office in 3  weeks with repeat ABI's.  She  will also need to return to our office in approximately one week for staple  removal and wound check by the nurse.  If she experiences any problems or  has questions in the interim, she is asked to contact our office  immediately.      [name], P.A.      [name], MD  Electronically Signed    GC/MEDQ  D:  03/21/2006  T:  03/22/2006  Job:  [id number]   cc:   [facility] Office  [name]. [name], M.D.

## 2010-11-28 NOTE — Op Note (Signed)
NAMERASHIKA, BETTES               ACCOUNT NO.:  [id number]   MEDICAL RECORD NO.:  [id number]          PATIENT TYPE:  INP   LOCATION:  NA                           FACILITY:  [facility]   PHYSICIAN:  Di Kindle. Edilia Bo, M.D.DATE OF BIRTH:  18-Dec-1940   DATE OF PROCEDURE:  06/04/2006  DATE OF DISCHARGE:                               OPERATIVE REPORT   PREOPERATIVE DIAGNOSIS:  Ischemic right lower extremity with occluded  right fem-pop bypass graft.   PROCEDURE:  Redo right femoral to below-knee popliteal artery bypass  graft with a PTFE graft.  This was a Propaten graft, intraoperative  arteriogram.   SURGEON:  Di Kindle. Edilia Bo, M.D.   ASSISTANT:  Pecola Leisure, PA  [place], [place].   ANESTHESIA:  General.   COMPLICATIONS:  None.   INDICATIONS:  This is a 70 year old woman who had undergone previous  right fem-pop bypass graft.  This had subsequently occluded and she had  undergone thrombectomy and revision.  This occluded again and despite  thrombolysis there appeared to be residual clot in the distal graft and  popliteal artery.  It was felt the best option for limb salvage was redo  right fem below-knee pop bypass grafting.  The procedure and potential  complications were discussed with the patient preoperatively.  All of  her questions were answered.  She was agreeable to proceed.   TECHNIQUE:  The patient was taken to the operating received a general  anesthetic.  The right lower extremity was prepped and draped in usual  sterile fashion.  The catheter in the left groin which radiology had  placed was removed and pressure held for hemostasis for 15 minutes.  The  incision the right groin was opened and through dense scar tissue, the  fem-pop graft, the common femoral, superficial and deep femoral arteries  were all controlled with vessel loops.  Next, the below-knee popliteal  artery was exposed through a longitudinal incision and was thickened and  small  but appeared to be patent distally.  A tunnel was created between  the two incisions.  The 6 mm Propaten PTFE graft was tunneled between  the two incisions.  The patient was then heparinized.  The common  femoral, superficial and deep femoral arteries were all clamped as was  the old graft.  The old graft was removed from this previous vein patch.  There was intimal hyperplasia present here and the Gore-Tex graft was  widely spatulated and was sewn end-to-side to the common femoral artery.  Prior to completing anastomosis, the inflow was tested and there was a  good inflow.  The anastomosis was complete in the graft was clamped.  Graft was then pulled to the appropriate length for anastomosis to the  below-knee popliteal artery.  The tourniquet was placed in the thigh.  The leg exsanguinated with an Esmarch bandage, the tourniquet inflated  to 250 mmHg.  Under tourniquet control a longitudinal arteriotomy was  made in the distal popliteal artery.  This was extended slightly onto  the anterior tibial artery.  The graft was cut to appropriate length,  spatulated and  sewn end to side to the below-knee popliteal arteries  extending slightly onto the anterior tibial artery with continuous 6-0  Prolene suture.  At completion, the tourniquet was released, artery back  bled and flushed appropriately and anastomosis completed.  Flow was  reestablished to the right leg.  The intraoperative arteriogram was  obtained which showed no technical problems.  Hemostasis was obtained  was in the  wounds.  15 Blake drains were placed in both wounds.  The wounds were  closed with deep layer of 3-0 Vicryl.  The skin closed with staples.  Sterile dressing was applied.  The patient tolerated procedure well, was  transferred to recovery room in satisfactory condition.  All needle and  sponge counts were correct.      Di Kindle. Edilia Bo, M.D.  Electronically Signed     CSD/MEDQ  D:  06/04/2006  T:   06/04/2006  Job:  [id number]

## 2010-11-28 NOTE — H&P (Signed)
[name], [name]               ACCOUNT NO.:  [id number]   MEDICAL RECORD NO.:  [id number]          PATIENT TYPE:  AMB   LOCATION:  SDS                          FACILITY:  [facility]   PHYSICIAN:  [name], MD  DATE OF BIRTH:  Apr 24, 1941   DATE OF ADMISSION:  12/21/2005  DATE OF DISCHARGE:                                HISTORY & PHYSICAL   CHIEF COMPLAINT:  Right greater than left lower extremity pain.   HISTORY OF PRESENT ILLNESS:  The patient is a 70 year old black female with  progressive bilateral lower extremity pain, right greater than left, with  claudication.  The patient had symptoms for the past several years.  They  have worsened and at this point she begins to have claudication at  approximately 50 feet in her right lower extremity.  She mostly has right  calf pain but if she continues to ambulate, she also has pain in both the  thigh and her feet.  The patient also has 2 small areas in her third and  fifth toe where she trimmed a corn which have not healed very well.  She  has no evidence of infection or ulceration.  She has no other evidence of  tissue loss.  She underwent arteriogram on December 15, 2005, by Dr. Darrick Penna.  He  has reviewed this study.  The results are not currently available to the  chart, but it is his opinion that the patient is a candidate for a right  femoral-popliteal bypass with an endarterectomy of the femoral artery.  The  patient is agreeable to this and will be admitted on December 21, 2005.   PAST MEDICAL HISTORY:  1.  She denies diabetes, cardiac or renal disease.  2.  She does have hypercholesterolemia.  3.  Peripheral vascular occlusive disease.  4.  Hypertension.   PAST SURGICAL HISTORY:  Total abdominal hysterectomy.   SOCIAL HISTORY:  History of long-term tobacco use.  She quit approximately 1  month ago.  Uses approximately 1 pack per day for 30 years.  Alcohol use:  None.   FAMILY HISTORY:  Multiple family members on her mother's  side have had  sudden death, for which she was told was related to heart disease.   REVIEW OF SYSTEMS:  See the history of present illness for current  positives, otherwise unremarkable.   CURRENT MEDICATIONS:  1.  Avalide 1.25 mg daily.  2.  Lipitor 80 mg daily.  3.  Plavix 75 mg daily, which was started 1 week ago by Dr. Valentina Lucks.   PHYSICAL EXAMINATION:  VITAL SIGNS:  Blood pressure is 149/80, respirations  15, heart rate 56, temperature 96.9.  GENERAL:  This is a well-developed black female who appears younger than her  stated age.  She is in no acute distress.  HEENT:  Pharynx is clear.  She has full dentures.  Pupils equal, round, and  reactive to light and extraocular movements intact.  Sclerae are anicteric.  NECK:  Supple, no thyromegaly, no lymphadenopathy, no bruits.  PULMONARY:  Clear to auscultation.  No wheezes, rhonchi or rales.  CARDIAC:  Regular rate and rhythm, normal S1, S2, without murmurs, gallops  or rubs.  ABDOMEN:  Benign, no hepatosplenomegaly or masses, normoactive bowel sounds.  NEUROLOGIC:  Gait was not tested.  Neurologically she is grossly intact.  EXTREMITIES:  No clubbing, cyanosis, or edema.  SKIN:  Warm and dry, no lesions or rashes.  MUSCULOSKELETAL:  She has 5+ strength equal bilaterally grossly.  PERIPHERAL PULSES:  Present in the radial and femoral.  The dorsalis pedis  and posterior tibial are only obtainable by Doppler.   ASSESSMENT:  Severe right femoral artery disease.  The full arteriogram will  be reviewed by Dr. Darrick Penna and the plan regarding the actual surgical  procedure will be determined and discussed with the patient, but tentatively  it is scheduled to be a right femoral-popliteal bypass with femoral artery  endarterectomy.      Rowe Clack, P.A.-C.      [name], MD  Electronically Signed    WEG/MEDQ  D:  12/15/2005  T:  12/15/2005  Job:  [id number]   cc:   Gretta Arab. Valentina Lucks, M.D.  Fax: [phone]

## 2010-11-28 NOTE — H&P (Signed)
[name], Fields               ACCOUNT NO.:  [id number]   MEDICAL RECORD NO.:  [id number]          PATIENT TYPE:  INP   LOCATION:  2035                         FACILITY:  [facility]   PHYSICIAN:  [name], MD  DATE OF BIRTH:  1940/11/15   DATE OF ADMISSION:  03/12/2006  DATE OF DISCHARGE:  03/22/2006                                HISTORY & PHYSICAL   CHIEF COMPLAINT:  Right lower extremity pain.   HISTORY OF PRESENT ILLNESS:  [name] is a 70 year old female with a  history of peripheral vascular occlusive disease who is status post previous  right popliteal bypass per Dr. Darrick Penna in June 2007.  She recently began to  experience right lower extremity pain and was seen in our office for lower  extremity Doppler studies, which revealed an occluded right femoral  popliteal bypass graft and a significantly diminished right ankle brachial  indices, now at 0.19 from the previous of 1.0.   PAST MEDICAL HISTORY:  1. Peripheral vascular occlusive disease status post right femoral      popliteal bypass graft in June 2006 by Dr. Darrick Penna.  2. Hypertension.  3. Hyperlipidemia.  4. History of total abdominal hysterectomy.   ALLERGIES:  CODEINE.   MEDICATIONS:  1. Aspirin 325 mg p.o. daily.  2. Avalide 150/12.5 mg p.o. daily.  3. Lipitor 40 mg p.o. daily.   REVIEW OF SYSTEMS:  Positive for numbness and pain into her right leg.  She  also wears corrective lenses and upper and lower dentures.   FAMILY HISTORY:  Noncontributory.   SOCIAL HISTORY:  She smoked tobacco 1 pack a day for 30 years, but quit in  May of this year.  She denies use of alcohol or substance abuse.   PHYSICAL EXAMINATION:  VITAL SIGNS:  Temperature 96.9, heart rate 56,  respirations 16, blood pressure 149/80, oxygen saturation 99% on room air.  As reported on my exam on March 13, 2006 as I had not personally  examined her prior to admission.  GENERAL:  She is drowsy, but is awakened easily, able to  answer questions  appropriately.  CARDIAC:  Regular rhythm.  Telemetry shows sinus tachycardia.  PULMONARY:  Lung sounds are clear anteriorly.  ABDOMEN:  Her abdomen is soft, nontender, and nondistended.  EXTREMITIES:  She has __________ right foot with positive motor and sensory  function.  The left foot has palpable distal pulses.  At the time, she had a  cath in her left groin for tPA.  The site showed a small amount of bleeding  but no evidence hematoma.   ASSESSMENT:  Ischemic right foot with an occluded femoral popliteal bypass  graft.   PLAN:  She will be admitted to [facility] on March 12, 2006.  Dr.  Darrick Penna has contacted radiology and asked them to attempt thrombolysis.  If  this is unsuccessful, it is anticipated that she will require intraoperative  thrombectomy with revision.      Jerold Coombe, P.A.      [name], MD  Electronically Signed    AWZ/MEDQ  D:  04/27/2006  T:  04/28/2006  Job:  [id number]

## 2010-11-28 NOTE — H&P (Signed)
[name], FOLGER NO.:  [id number]   MEDICAL RECORD NO.:  [id number]          PATIENT TYPE:  IPS   LOCATION:  4031                         FACILITY:  [facility]   PHYSICIAN:  Ellwood Dense, M.D.   DATE OF BIRTH:  [birth year]   DATE OF ADMISSION:  09/06/2006  DATE OF DISCHARGE:                              HISTORY & PHYSICAL   VASCULAR SURGEON:  [name], MD.   TREATING PHYSICIAN:  Dr. Valentina Lucks.   HISTORY OF PRESENT ILLNESS:  [name] is a 70 year old African  American female with history of peripheral vascular disease with  multiple vascular procedures previously.  She was on chronic Coumadin  prior to this admission.   The patient was admitted August 29, 2006, after recent discharge for  an occluded right femoral popliteal bypass graft which had undergone  thrombolysis.  She presented with an ischemic change to her right lower  extremity with the graft again being occluded.  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  Darrick Penna.  PCA pump was discontinued September 05, 2006.  There is no current plan to resume Coumadin, and she is presently  on aspirin and subcu heparin.   The patient was evaluated by the rehabilitation physicians and felt to  be an appropriate candidate for inpatient rehabilitation.   REVIEW OF SYSTEMS:  Positive for weakness and reflux.   PAST MEDICAL HISTORY:  1. Peripheral vascular disease with multiple vascular procedures.  2. Hypertension.  3. Dyslipidemia.  4. History of hematoma of the left lower quadrant per chart.  5. Prior hysterectomy.   FAMILY HISTORY:  Positive for coronary artery disease.   SOCIAL HISTORY:  The patient does not use alcohol and reports remote  tobacco use.  She lives alone in a one level home with two steps to  enter.  There is local family, but they all work.   FUNCTIONAL HISTORY:  Prior to admission, independent and  rather  sedentary.   ALLERGIES:  CODEINE.   MEDICATIONS PRIOR TO ADMISSION:  1. Vitamin D 400 units daily.  2. Multivitamin daily.  3. Coumadin 2.5 mg daily.  4. Avalide 150/12.5 one tablet daily.  5. Aspirin 81 mg daily.   LABORATORY DATA:  Recent hemoglobin was 9.1 with hematocrit of 27.2,  platelets 242,000, white count 16.7.  Recent sodium 132, potassium 3.7,  chloride 97, CO2 29, BUN 4, creatinine 0.5.   PHYSICAL EXAMINATION:  GENERAL:  Well-appearing, thin, adult female who  appears younger than her stated age.  VITAL SIGNS:  Blood pressure 162/70, pulse 89, respirations 20,  temperature 99.4.  O2 saturation on room air was 96%.  HEENT:  Normocephalic, atraumatic.  CARDIOVASCULAR:  Regular rate and rhythm.  S1, S2 without murmurs.  ABDOMEN:  Soft, nontender with positive bowel sounds.  LUNGS:  Clear to auscultation bilaterally.  NEUROLOGICAL:  Alert and oriented x3.  Cranial nerves II-XII intact.  EXTREMITIES:  Bilateral upper extremity exam showed 5 to 5-/5 strength  throughout.  Bulk and tone were normal.  Reflexes were 2+ and  symmetrical.  LOWER EXTREMITIES:  A 4+ to 5-/5 strength in the right lower extremity  and 5-/5 strength in the left lower extremity.  Wound of the right below-  the-knee amputation site was present with dry dressing in place without  significant drainage.   IMPRESSION:  1. Status post right below-the-knee amputation, postoperative day #4,      performed by Dr. Darrick Penna.  2. Pain control on p.r.n. Vicodin.  3. Hypertension.  4. Anemia with follow up labs pending.  5. Apparently, the patient has deficits in ADL's, transfers and      ambulation secondary to the above noted right below-the-knee      amputation, postoperative day #4.   PLAN:  1. Admit to the rehabilitation unit for daily physical therapy, range      of motion, strength, bed mobility, transfers, pre-gait training and      gait training along with equipment __________ .  2.  Occupational therapy for range of motion, strengthening, ADL's,      cognitive/perceptual training, splinting and equipment of bowel.  3. Rehab nursing for skin care, wound care and bowel and bladder      training.  4. Case management to assess home environment.  Assist with discharge      planning and arrange for appropriate follow up care.  5. Social work to assess family/social support.  Counseled patient      regarding disability issues and assistant discharge planning.  6. Subcu heparin 5000 units q.12 h for DVT prophylaxis.  7. Continue 4x4 dry dressing to the right below-the-knee amputation      site with Kerlix and ACE wrap changed daily and p.r.n.  8. Aspirin 81 mg p.o. daily.  9. Multivitamin one p.o. daily.  10.Vitamin D 400 units p.o. daily.  11.Avapro 150 mg p.o. daily.  12.Lopressor 12.5 mg p.o. b.i.d. for blood pressure control.  13.Continue Trinsicon one tablet p.o. b.i.d. for postoperative anemia.  14.Routine turning to prevent skin breakdown.  15.Prafo to the left lower extremity to prevent heel ulcer.  16.Dulcolax suppository per rectum daily.  17.Vicodin 5/500 1-2 tablets p.o. q.4 h p.r.n. pain.   PROGNOSIS:  Good.   ESTIMATED LENGTH OF STAY:  Five to ten days.   GOALS:  Modified independent, ADL's, transfers and mobility per manual  wheelchair.           ______________________________  Ellwood Dense, M.D.     DC/MEDQ  D:  09/06/2006  T:  09/06/2006  Job:  [id number]

## 2010-11-28 NOTE — Op Note (Signed)
[name], [name]               ACCOUNT NO.:  [id number]   MEDICAL RECORD NO.:  [id number]          PATIENT TYPE:  AMB   LOCATION:  SDS                          FACILITY:  [facility]   PHYSICIAN:  [name], MD  DATE OF BIRTH:  26-Jan-1941   DATE OF PROCEDURE:  12/15/2005  DATE OF DISCHARGE:  12/15/2005                                 OPERATIVE REPORT   PROCEDURE:  Aortogram with bilateral lower extremity runoff.   PREOPERATIVE DIAGNOSIS:  Rest pain right foot.   POSTOPERATIVE DIAGNOSIS:  Rest pain right foot.   ANESTHESIA:  Local with IV sedation.   ASSISTANT:  Nurse.   OPERATIVE DETAIL:  After obtaining informed consent, the patient was brought  to the [facility] lab.  The patient was placed in the supine position on the angio  table.  Next, both groins were prepped and draped in the usual sterile  fashion.  Local anesthesia was infiltrated over the left common femoral  artery.  A Majestic needle was used to cannulate the left common femoral  artery and a 0.035 J-tip guide wire threaded into the abdominal aorta under  fluoroscopic guidance.  Next, a 5-French sheath was placed over the  guidewire in the left common femoral artery and flushed thoroughly with  heparinized saline.  A 5-French pigtail catheter was then placed over the  guidewire into the abdominal aorta.  Abdominal aortogram was obtained.  There are mild to moderate atherosclerotic changes of the abdominal aorta  with no focal stenosis.  There are two right renal arteries which are  patent.  There are two left renal arteries which come off of a common origin  which are widely patent.  The left and right common iliac arteries are  patent.  The left and right internal and external iliac arteries are patent.  The right leg shows the common femoral artery to have a high grade stenosis  greater than 90% just below the inguinal ligament.  The profunda femoris and  superficial femoral arteries on the right side have some  narrowing at the  origin.  The left profunda and superficial femoral artery are patent at  their origin.  The right superficial femoral artery occludes in the mid  thigh. The superficial femoral artery then reconstitutes via profunda  collaterals just below the adductor hiatus.  The popliteal artery is patent  but small in this region.  The below knee popliteal artery is widely patent.  There is three vessel runoff to the right lower extremity with mild tibial  occlusive disease and a diminutive peroneal artery.  On the left side, there  is a high grade stenosis of the left superficial femoral artery in the mid  thigh.  This was greater than 90%.  The popliteal artery is patent.  There  is three vessel runoff to the left foot with a diminutive peroneal artery.  There is also mild tibial artery occlusive disease on the left.   Peek hole views were obtained of both legs at the level of the knee and this  again confirms that the anterior tibial and posterior tibial arteries  are  patent bilaterally with a diminutive peroneal system on both sides.   Next, the pigtail catheter was removed over a guidewire and the guidewire  removed through the sheath.  The sheath was thoroughly flushed with  heparinized saline.  The patient was taken to the holding area to have the  sheath removed at that location.  The patient tolerated the procedure well  and there were no complications.   IMPRESSION:  1.  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  2.  High grade left superficial femoral artery stenosis.  3.  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      [name], MD  Electronically Signed     CEF/MEDQ  D:  12/15/2005  T:  12/15/2005  Job:  [id number]

## 2010-11-28 NOTE — Discharge Summary (Signed)
[name], [name]               ACCOUNT NO.:  [id number]   MEDICAL RECORD NO.:  [id number]          PATIENT TYPE:  INP   LOCATION:  2021                         FACILITY:  [facility]   PHYSICIAN:  [name], MD  DATE OF BIRTH:  07-18-1940   DATE OF ADMISSION:  10/02/2006  DATE OF DISCHARGE:  10/05/2006                               DISCHARGE SUMMARY   ADDENDUM TO JOB NO. [id number]   UPDATED DISCHARGE MEDICATION LIST:  1. Aspirin 81 mg daily.  2. Avalide 150/12.5 mg daily.  3. Centrum Silver daily.  4. Vitamin D 400 mg daily.  5. Neurontin 100 mg three times daily.  6. Keflex 500 mg three times daily x7 days.  7. OxyContin 10 mg every 12 hours x2 weeks.  8. Oxy-IR 5 mg one to two tablets every four hours as needed for pain.  9. Ultram 50 mg one to two tablets every six hours as needed for pain.      Jerold Coombe, P.A.      [name], MD  Electronically Signed    AWZ/MEDQ  D:  10/05/2006  T:  10/05/2006  Job:  [id number]

## 2010-11-28 NOTE — Discharge Summary (Signed)
NAMESHARLENE, Fields               ACCOUNT NO.:  [id number]   MEDICAL RECORD NO.:  [id number]          PATIENT TYPE:  INP   LOCATION:  2009                         FACILITY:  [facility]   PHYSICIAN:  Cindy Hora. Fields, MD  DATE OF BIRTH:  Jul 14, 1940   DATE OF ADMISSION:  12/21/2005  DATE OF DISCHARGE:  12/25/2005                                 DISCHARGE SUMMARY   This 72 ounces p.c. dictated discharge summary.  The patient shortly gram  the medical record number is [id number] dictated for Dr. Leonette Most Fields day of  admission 12/21/2076 anticipated discharge 12/25/2005 to.   PRIMARY ADMITTING DIAGNOSIS:  Right lower extremity claudication and rest  pain.   ADDITIONAL/DISCHARGE DIAGNOSES:  1.  Right lower extremity claudication and rest pain.  2.  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  3.  Hypercholesterolemia.  4.  Hypertension.  5.  Peripheral vascular occlusive disease.  6.  History of tobacco abuse.   PROCEDURES PERFORMED:  1.  Right femoral endarterectomy with vein patch angioplasty.  2.  Right femoral to above-knee popliteal bypass using 6 mm Gore-Tex graft.  3.  Intraoperative arteriogram.   HISTORY:  The patient is a 70 year old black female who, for the past  several years, has had progressive bilateral lower extremity claudication  symptoms, right greater than left.  They may have progressively worsened and  at this point, she is claudication symptoms at approximately 50 feet, as  well as right lower extremity rest pain.  She also has 2 small areas in the  third and fifth toe where she trimmed a callus and have not healed well.  She was seen in the office by Dr. Darrick Fields and subsequently underwent an  arteriogram, which showed a high-grade stenosis of the common femoral artery  on the right with occlusion of the superficial femoral artery.  It was Dr.  Darrick Fields' opinion that she should undergo a right  femoral to popliteal bypass  with an endarterectomy and femoral artery at this time.  He explained the  risks, benefits and alternatives of the procedure to the patient, and she  agreed to proceed with surgery.   HOSPITAL COURSE:  She was admitted to [facility] on December 21, 2005  and underwent a right femoral endarterectomy, and a right femoral to above-  knee popliteal bypass, as described in detail above.  She tolerated the  procedure well and was transferred to the floor from the PACU in stable  condition.  Postoperatively, she has done well.  She did develop a fever up  to 101.4 on the night of postop day #1.  She showed no signs on physical  exam of infection and denied any other symptoms such as cough or dysuria.  White blood cell count was 10.  She was treated conservatively with  aggressive pulmonary toilet measures and was slowly mobilized.  Since that  time, she has remained afebrile and her vital signs have otherwise been  stable.  Her surgical incision sites were all healing well.  She continues  to have a small amount of serosanguineous drainage from her groin area.  There is no erythema or signs of acute infection.  She has a 2 to 3+  palpable dorsalis pedis pulse.  Her foot is warm and well perfused, and her  toe ulcers are stable.  Her most recent labs from December 22, 2005 show a  hemoglobin of 10.6, hematocrit 31, platelets 127 and white count 10.  Sodium  134, potassium 4.1, BUN 7 and creatinine 0.7.  She underwent repeat lower  extremity Doppler studies, which showed ankle-brachial indices of 0.64 on  the left and greater than 1 on the right.  She has been ambulating in the  halls without problem.  She is tolerating a regular diet without difficulty,  and is having normal bowel and bladder function.  She will have a repeat CBC  on the morning of December 25, 2005.  At that time, if she has remained stable  otherwise and her labs look good, she will hopefully be ready  for discharge  home at that time.   DISCHARGE MEDICATIONS:  Are as follows:  1.  Tylox 1-2 q.4h. p.r.n. for pain.  2.  Avalide 1.25 mg daily.  3.  Lipitor 80 mg daily.  4.  Plavix 75 mg daily.   DISCHARGE INSTRUCTIONS:  She is asked to refrain from driving, heavy lifting  or strenuous activity.  She may continue ambulating daily and using her  incentive spirometer.  She may shower daily, and clean her incisions with  soap and water.  She will continue her same preoperative diet.   DISCHARGE FOLLOWUP:  She will see Dr. Darrick Fields back in the office in 3 weeks  and will have repeat ABIs at that visit.  She will follow up in 1 week in  the [facility] office with nurse for a wound check and staple removal.  She will  contact our office in the interim if she experiences any problems or has  questions.      [name], P.A.      Cindy Hora. Fields, MD  Electronically Signed    GC/MEDQ  D:  12/24/2005  T:  12/24/2005  Job:  [id number]   cc:   [name], M.D.  Fax: [phone]

## 2010-11-28 NOTE — H&P (Signed)
[name]               ACCOUNT NO.:  [id number]   MEDICAL RECORD NO.:  [id number]          PATIENT TYPE:  INP   LOCATION:  2307                         FACILITY:  [facility]   PHYSICIAN:  [name], MD  DATE OF BIRTH:  1940-07-21   DATE OF ADMISSION:  08/04/2006  DATE OF DISCHARGE:                              HISTORY & PHYSICAL   CHIEF COMPLAINT:  Cold, painful right foot.   HISTORY OF PRESENT ILLNESS:  The patient is a 70 year old African  American female who is well known to [facility].  The patient has a history of  the right peripheral vascular occlusive disease.  She was undergoing  right femoral to popliteal bypass grafting with a right femoral  endarterectomy in June 2007.  This was followed by thrombectomy of her  right fem-pop with revision using a PTFE graft in September 2007.  The  patient had developed occlusion of her right fem-pop passed which  required redo right femoral to below knee popliteal in November 2007.  The patient has also undergone thrombolysis x2.  She presented to the  office today, August 04, 2006, and was seen by Dr. Edilia Bo.  She  complained of right foot pain since Saturday with numbness.  The patient  states this pain is different from any other time her graft has  occluded.  She states numbness is new.  She has noticed foot pain with  rest as well as with ambulation.  Denies any ulcerations, fevers, or  chills.   PAST MEDICAL HISTORY:  1. Hypertension.  2. Hyperlipidemia.   PAST SURGICAL HISTORY:  1. Status post right femoral to popliteal bypass graft with right      femoral endarterectomy in June 2007.  2. Status post thrombectomy of right femoral to popliteal bypass graft      with revision using a 6 mm PTFE graft in September 2007.  3. Status post redo right femoral to below knee popliteal bypass graft      in November 2007.  4. Status post total abdominal hysterectomy.   ALLERGIES:  CODEINE.   MEDICATIONS:  1. Aspirin 81  mg daily.  2. Centrum Silver 1 tablet daily.  3. Vitamin D daily.  4. Avalide daily.   SOCIAL HISTORY:  The patient is single.  She lives at home by herself.  She has a history of tobacco use which she states she does not use  anymore.   REVIEW OF SYSTEMS:  See HPI for pertinent positives and negatives.  The  patient denies any recent fevers, chills, night sweats, or cold.  She  denies any recent change in vision, hearing, or difficulty swallowing.  Denies any shortness of breath, cough, hemoptysis or wheezing.  Denies  any chest pain, palpitations, light headedness, dizziness, orthopnea,  paroxysmal nocturnal dyspnea.  She denies any nausea, vomiting,  abdominal pain, changes in bowel movements, diarrhea, constipation,  melena, hematochezia.  She denies any amaurosis fugax, slurred speech,  dysphagia, or muscle weakness.   PHYSICAL EXAMINATION:  GENERAL:  Well developed, well nourished African American female in no  acute distress.  VITAL SIGNS: Blood  pressure 146/46, pulse 93, temperature of 99.5.  HEENT: Normocephalic, atraumatic.  Pupils equal, round, and reactive to  light and accommodation.  Extraocular movements intact.  Oral mucosa  pink and moist.  NECK:  Supple.  RESPIRATORY:  Clear to auscultation bilaterally.  CARDIAC:  Regular rate and rhythm.  ABDOMEN:  Soft, nontender.  Normoactive bowel sounds.  GENITOURINARY:  Deferred.  RECTAL:  Deferred.  EXTREMITIES:.  The patient's bilateral feet are cold to touch.  She has  no palpable dorsalis pedis or posterior pulses bilaterally.  She does  have a +1 left femoral pulse.  The patient has 2+ bilateral radial  pulses noted.  No ulcerations or edema noted in bilateral lower  extremities.  NEUROLOGIC:  Cranial nerves 2-12 intact.  The patient is alert and  oriented x4.   IMPRESSION AND PLAN:  The patient is seen and evaluated by Dr. Edilia Bo  in the office.  Dr. Edilia Bo discussed with the patient the diagnosis of  occluded  right femoral to popliteal bypass graft.  He discussed with the  patient being admitted to [facility] and undergoing  thrombolysis in radiology.  The risks and benefits were discussed.  The  patient acknowledged her understanding and agreed to proceed.  The plan  is to admit the patient August 04, 2006.  She will go directly to  radiology where she will undergo thrombolysis.  The patient will then be  admitted to [facility].      [name], [place]      [name], MD  Electronically Signed    KMD/MEDQ  D:  08/05/2006  T:  08/05/2006  Job:  [id number]

## 2010-11-28 NOTE — Discharge Summary (Signed)
NAMEJANESA, [name]               ACCOUNT NO.:  [id number]   MEDICAL RECORD NO.:  [id number]          PATIENT TYPE:  INP   LOCATION:  2004                         FACILITY:  [facility]   PHYSICIAN:  Di Kindle. Edilia Bo, M.D.DATE OF BIRTH:  1941-04-14   DATE OF ADMISSION:  06/01/2006  DATE OF DISCHARGE:                               DISCHARGE SUMMARY   ADMISSION DIAGNOSIS:  Occluded right femoral-popliteal bypass graft.   PAST MEDICAL HISTORY/DISCHARGE DIAGNOSES:  1. Hypertension.  2. Hyperlipidemia.  3. Ischemic right lower extremity.  4. Occluded right femoral popliteal bypass graft.  5. Status post thrombectomy in revision in September 2007, status post      incomplete thrombolysis, November 2007, followed by status post      redo right femoral to below the knee popliteal bypass graft with      Gore-Tex.   ALLERGIES:  CODEINE.   BRIEF HISTORY:  The patient is a 70 year old female, who is status post  right femoral to above-the-knee popliteal bypass graft by Dr. Darrick Penna in  June 2007 with Gore-Tex graft.  The graft clotted in September 2007, and  she underwent a thrombectomy and revisions by Dr. Darrick Penna at that time.  Approximately two weeks prior to admission, the patient's legs started  hurting again and she developed rest pain.  Two days prior to admission,  the pain got worst and she was seen in the office on June 01, 2006,  and seen by Dr. Hart Rochester and, based on decreased ABI's on the right, it  was apparent the right femoral-popliteal bypass graft was occluded.  His  plan was for arteriogram and redo bypass the following week; however ,  the patient felt that her symptoms had worsened and she presented to the  emergency room at [facility].   HOSPITAL COURSE:  The patient was admitted via the emergency room on  June 01, 2006, for increasing pain in the right lower extremity  secondary to occluded right femoral to above-the-knee popliteal bypass  graft.  She was  scheduled for an arteriogram and thrombolysis in  Interventional Radiology.   The patient was taken by Interventional Radiology on June 02, 2006,  for a thrombolysis of her right femoral to above-the -knee popliteal  Gore-Tex graft.  She was followed closely, and then the arteriogram was  evaluated on June 03, 2006, which showed some restored patency of  the bypass graft with resolved clotting except at the distal  anastomosis.  Thrombolysis was continued as she was taken again to  Interventional Radiology for repeat arteriogram on June 04, 2006,  which again showed incomplete thrombolysis.  The patient was therefore  taken back to the OR on June 04, 2006, for a redo right femoral to  below-the-knee popliteal bypass graft with Gore-Tex as well as an  inguinal hernia and intraoperative arteriogram.  The patient tolerated  the procedure well and was placed on ampicillin postoperatively.  Discharged from the OR and transferred to the post-anesthesia care unit  in stable condition.  The patient was extubated without complication and  woke up from anesthesia neurologically intact.   The patient's  postoperative course has progressed as expected.  Her diet  has been restarted, and her JP drain had been removed successfully.  On  postoperative day 3, she is comfortable.  She is afebrile with stable  vital signs.   PHYSICAL EXAMINATION:  There is a probable dorsalis pedis pulse.  The  incisions are healing well, and she has moderate leg swelling.  The  patient is in stable condition.  As long as she continues to progress in  the current manner and increases her mobility, she will be ready for  discharge home in the next 1 to 2 days pending wound reevaluation.   LABORATORY DATA:  CBC on June 06, 2006: Hemoglobin 9.2, hematocrit  26.3, white count 11.7, platelets 68.  CMP on June 06, 2006:  Sodium  126, potassium 3.4, BUN 7, creatinine 0.5, glucose 133.   CONDITION ON  DISCHARGE:  Improved.   INSTRUCTIONS:  Diet, activity, and wound care were discussed with  patient in detail.   MEDICATIONS:  1. Tylox 1-2 every 4-6 hours p.r.n. pain.  2. Lipitor 40 mg every day.  3. Avalide 150 per 12.5 every day.  4. Aspirin 325 mg every day.  5. Multivitamin every day.   Follow-up appointment with Dr. Durwin Nora on June 30, 2006 at 10:30 a.m.      Pecola Leisure, PA      Di Kindle. Edilia Bo, M.D.  Electronically Signed    AY/MEDQ  D:  06/07/2006  T:  06/07/2006  Job:  [id number]   cc:   Thomas B. Dixon, P.A.

## 2010-11-28 NOTE — Op Note (Signed)
[name], [name]                         ACCOUNT NO.:  [id number]   MEDICAL RECORD NO.:  [id number]                   PATIENT TYPE:  OIB   LOCATION:  NA                                   FACILITY:  [facility]   PHYSICIAN:  [name], MD               DATE OF BIRTH:  12-25-1940   DATE OF PROCEDURE:  02/13/2003  DATE OF DISCHARGE:                                 OPERATIVE REPORT   PREOPERATIVE DIAGNOSIS:  Bilateral lower extremity claudication.   POSTOPERATIVE DIAGNOSIS:  Bilateral lower extremity claudication.   PROCEDURE PERFORMED:  Aortogram with bilateral lower extremity runoff.   SURGEON:  [name], MD   ANESTHESIA:  Local.   INDICATIONS FOR PROCEDURE:  The patient has significant bilateral lower  extremity claudication with the left side being worse than the right.  She  also has decreased thigh pressure on the left.   FINDINGS:  1. Mild focal left renal artery stenosis.  2. Bilateral common femoral artery stenosis, right side is 70%, left side is     50%.  3. Bilateral superficial femoral artery stenosis, left greater than right.   DESCRIPTION OF PROCEDURE:  After obtaining informed consent, the patient was  taken to the angio suite.  Both groins were prepped and draped in the usual  sterile fashion.  Next, the right common femoral artery was cannulated and  using a Seldinger technique, a 0.035 J-tipped guidewire was placed into the  abdominal aorta.  A 5 French sheath was then placed over this.  Next, a  femoral artery pressure was obtained.  It was approximately 167 mmHg.  A  pigtail catheter was then placed in the abdominal aorta and an aortogram was  performed which showed mild atherosclerotic changes of the abdominal aorta  with a focal 25% stenosis of the proximal left renal artery.  The right  renal artery was patent without stenosis.  The common iliac arteries have  mild diffuse atherosclerosis with no focal stenosis.  The hypogastric artery  on the left was patent with diffuse atherosclerosis.  The right hypogastric  artery is patent but also with atherosclerotic changes.  The common femoral  arteries were stenosed bilaterally.  The right common femoral artery has  approximately 70% stenosis, the left common femoral artery has approximately  50% stenosis.  The profunda origins are patent bilaterally.  The superficial  femoral artery is patent proximally; however, there is a greater than 75%  stenosis of the superficial femoral artery bilaterally.  The popliteal  artery is patent without focal stenosis.  The tibial peroneal trunk  bilaterally is patent without focal stenosis.  The anterior tibial,  posterior tibial, tibioperoneal trunk and peroneal artery origins were all  patent without focal stenosis.  The tibial vessels have atherosclerotic  changes bilaterally.  The right peroneal has stenosis at its proximal  portion but is not occluded anywhere.  The posterior tibial and  anterior  tibial arteries were patent bilaterally with no focal areas of stenosis and  runoff into the foot.   After obtaining images of the lower extremities, the pigtail catheter was  removed and the 5 French sheath was removed and pressure was held on the  groin for hemostasis.  The patient was then transferred to the recovery room  in stable condition.  No complications.                                                [name], MD    CEF/MEDQ  D:  02/13/2003  T:  02/13/2003  Job:  [id number]

## 2010-12-30 MED ORDER — TELMISARTAN-HYDROCHLOROTHIAZIDE 80 MG-25 MG TAB
80-25 mg | ORAL_TABLET | Freq: Every day | ORAL | Status: DC
Start: 2010-12-30 — End: 2011-04-01

## 2010-12-30 NOTE — Progress Notes (Signed)
HISTORY OF PRESENT ILLNESS  Valerie Barry is a 70 y.o. female.  HPI  Still under a lot of stress re: her 72 yo motherr  Hypertension  Hypertension ROS: taking medications as instructed, no medication side effects noted, home BP monitoring in range of 150's systolic over 70-80's diastolic, no TIA's, no chest pain on exertion, no dyspnea on exertion, no swelling of ankles     reports that she has never smoked. She has never used smokeless tobacco.    reports that she does not drink alcohol.   BP Readings from Last 2 Encounters:   12/30/10 154/67   08/21/10 161/65     Hyperlipidemia  Currently she takes no meds  ROS: taking medications as instructed, no medication side effects noted  No new myalgias, no joint pains, no weakness  No TIA's, no chest pain on exertion, no dyspnea on exertion, no swelling of ankles.   Lab Results   Component Value Date/Time    Cholesterol, total 243 06/16/2010 11:45 AM    LDL, calculated 142 06/16/2010 11:45 AM    Triglyceride 133 06/16/2010 11:45 AM       Review of Systems   Constitutional: Negative for malaise/fatigue and diaphoresis.   Eyes: Negative for blurred vision.   Respiratory: Negative for cough, sputum production, shortness of breath and wheezing.    Cardiovascular: Negative for chest pain, palpitations, orthopnea, claudication, leg swelling and PND.   Gastrointestinal: Negative for nausea and abdominal pain.   Genitourinary: Negative for frequency.   Musculoskeletal: Negative for myalgias.   Neurological: Negative for dizziness, sensory change, focal weakness, weakness and headaches.   Psychiatric/Behavioral: Negative for depression and suicidal ideas. The patient does not have insomnia.    All other systems reviewed and are negative.        Physical Exam   Nursing note and vitals reviewed.  Constitutional: No distress.   Neck: No JVD present. No thyromegaly present.    Cardiovascular: Normal rate, regular rhythm and intact distal pulses.  Exam reveals no gallop and no friction rub.    Pulmonary/Chest: Effort normal and breath sounds normal.   Abdominal: Soft. Normal aorta and bowel sounds are normal. She exhibits no abdominal bruit and no pulsatile midline mass. There is no tenderness.   Musculoskeletal: She exhibits no edema.   Neurological: She is alert.   Skin: No rash noted.   Psychiatric: She has a normal mood and affect.       ASSESSMENT and PLAN  Valerie Barry was seen today for follow-up.    Diagnoses and associated orders for this visit:    Htn (hypertension)  - uncontrolled.  Will add hydrochlorothiazide.    - telmisartan-hydrochlorothiazide (MICARDIS HCT) 80-25 mg per tablet; Take 1 Tab by mouth daily.  - METABOLIC PANEL, COMPREHENSIVE  - LIPID PANEL    Mixed hyperlipidemia - The patient is asked to make an attempt to improve diet and exercise patterns to aid in medical management of this problem.

## 2010-12-31 LAB — LIPID PANEL
Cholesterol, total: 241 mg/dL — ABNORMAL HIGH (ref 100–199)
HDL Cholesterol: 73 mg/dL (ref >39)
LDL, calculated: 135 mg/dL — ABNORMAL HIGH (ref 0–99)
Triglyceride: 166 mg/dL — ABNORMAL HIGH (ref 0–149)
VLDL, calculated: 33 mg/dL (ref 5–40)

## 2010-12-31 LAB — METABOLIC PANEL, COMPREHENSIVE
A-G Ratio: 1.6 (ref 1.1–2.5)
ALT (SGPT): 13 IU/L (ref 0–40)
AST (SGOT): 13 IU/L (ref 0–40)
Albumin: 4.3 g/dL (ref 3.5–4.8)
Alk. phosphatase: 90 IU/L (ref 25–165)
BUN/Creatinine ratio: 23 (ref 11–26)
BUN: 15 mg/dL (ref 8–27)
Bilirubin, total: 0.3 mg/dL (ref 0.0–1.2)
CO2: 29 mmol/L (ref 20–32)
Calcium: 9.4 mg/dL (ref 8.6–10.2)
Chloride: 99 mmol/L (ref 97–108)
Creatinine: 0.64 mg/dL (ref 0.57–1.00)
GFR est non-AA: 89 mL/min/{1.73_m2} (ref >59)
GLOBULIN, TOTAL: 2.7 g/dL (ref 1.5–4.5)
Glucose: 109 mg/dL — ABNORMAL HIGH (ref 65–99)
Potassium: 5 mmol/L (ref 3.5–5.2)
Protein, total: 7 g/dL (ref 6.0–8.5)
Sodium: 138 mmol/L (ref 134–144)
eGFR If African American: 103 mL/min/{1.73_m2} (ref >59)

## 2011-04-01 MED ORDER — TELMISARTAN-HYDROCHLOROTHIAZIDE 80 MG-25 MG TAB
80-25 mg | ORAL_TABLET | Freq: Every day | ORAL | Status: DC
Start: 2011-04-01 — End: 2012-02-10

## 2011-04-01 NOTE — Progress Notes (Signed)
HISTORY OF PRESENT ILLNESS  Valerie Barry is a 70 y.o. female.  HPI  Hypertension  Hypertension ROS: taking medications as instructed, no medication side effects noted, side effects noted by patient includeexcessive urination, no TIA's, no chest pain on exertion, no dyspnea on exertion, no swelling of ankles     reports that she has never smoked. She has never used smokeless tobacco.    reports that she does not drink alcohol.   BP Readings from Last 2 Encounters:   04/01/11 149/59   12/30/10 154/67       Review of Systems   Constitutional: Negative for malaise/fatigue and diaphoresis.   Eyes: Negative for blurred vision.   Respiratory: Negative for cough, sputum production, shortness of breath and wheezing.    Cardiovascular: Negative for chest pain, palpitations, orthopnea, claudication, leg swelling and PND.   Gastrointestinal: Negative for nausea and abdominal pain.   Genitourinary: Negative for frequency.   Musculoskeletal: Negative for myalgias.   Neurological: Negative for dizziness, sensory change, focal weakness, weakness and headaches.   Psychiatric/Behavioral: The patient does not have insomnia.    All other systems reviewed and are negative.        Physical Exam   Nursing note and vitals reviewed.  Constitutional: She is oriented to person, place, and time. She appears well-developed and well-nourished. No distress.   HENT:   Mouth/Throat: No oropharyngeal exudate.   Cardiovascular: Normal rate, regular rhythm and normal heart sounds.    No murmur heard.  Pulmonary/Chest: Effort normal and breath sounds normal. No respiratory distress.   Neurological: She is alert and oriented to person, place, and time.   Psychiatric: She has a normal mood and affect. Her speech is normal and behavior is normal. Judgment and thought content normal. Cognition and memory are normal. She expresses no homicidal and no suicidal ideation.       ASSESSMENT and PLAN   Valerie Barry was seen today for follow-up and medication refill.    Diagnoses and associated orders for this visit:    Htn (hypertension) - advised to cut back on the large amt of water.  No other side effects, feeling well.  based on my history, the overall control of this problem borderline controlled, so will cont for now.    - telmisartan-hydrochlorothiazide (MICARDIS HCT) 80-25 mg per tablet; Take 1 Tab by mouth daily.  - METABOLIC PANEL, BASIC

## 2011-04-02 LAB — METABOLIC PANEL, BASIC
BUN/Creatinine ratio: 23 (ref 11–26)
BUN: 16 mg/dL (ref 8–27)
CO2: 27 mmol/L (ref 20–32)
Calcium: 9.6 mg/dL (ref 8.6–10.2)
Chloride: 99 mmol/L (ref 97–108)
Creatinine: 0.71 mg/dL (ref 0.57–1.00)
GFR est non-AA: 85 mL/min/{1.73_m2} (ref >59)
Glucose: 103 mg/dL — ABNORMAL HIGH (ref 65–99)
Potassium: 4.1 mmol/L (ref 3.5–5.2)
Sodium: 140 mmol/L (ref 134–144)
eGFR If African American: 98 mL/min/{1.73_m2} (ref >59)

## 2011-04-14 LAB — BASIC METABOLIC PANEL
BUN: 10
CO2: 29
Calcium: 10.1
Creatinine, Ser: 0.65
GFR calc Af Amer: >60
GFR calc non Af Amer: >60
Glucose, Bld: 115 — ABNORMAL HIGH

## 2011-04-14 LAB — CBC
HCT: 37.8
Hemoglobin: 12.7
MCHC: 33.6
MCV: 88.4
Platelets: 169
RDW: 14.4

## 2011-09-23 NOTE — Telephone Encounter (Signed)
Called and left message encouraging patient to have a mammogram.  Encouraged patient to call physician's office to schedule an appointment.  NOTE:  Last colonoscopy 01-29-2010.

## 2011-10-05 ENCOUNTER — Other Ambulatory Visit: Payer: Self-pay | Admitting: Family Medicine

## 2011-10-05 DIAGNOSIS — Z1231 Encounter for screening mammogram for malignant neoplasm of breast: Secondary | ICD-10-CM

## 2011-10-12 ENCOUNTER — Ambulatory Visit
Admission: RE | Admit: 2011-10-12 | Discharge: 2011-10-12 | Disposition: A | Discharge status: Home / Self Care | Payer: Medicare Other | Source: Ambulatory Visit | Attending: Family Medicine | Admitting: Family Medicine

## 2011-10-12 ENCOUNTER — Other Ambulatory Visit: Payer: Self-pay | Admitting: Family Medicine

## 2011-10-12 DIAGNOSIS — R05 Cough: Secondary | ICD-10-CM

## 2011-10-12 DIAGNOSIS — R059 Cough, unspecified: Secondary | ICD-10-CM

## 2011-10-15 ENCOUNTER — Ambulatory Visit
Admission: RE | Admit: 2011-10-15 | Discharge: 2011-10-15 | Disposition: A | Discharge status: Home / Self Care | Payer: Medicare Other | Source: Ambulatory Visit | Attending: Family Medicine | Admitting: Family Medicine

## 2011-10-15 DIAGNOSIS — Z1231 Encounter for screening mammogram for malignant neoplasm of breast: Secondary | ICD-10-CM

## 2012-02-10 MED ORDER — TELMISARTAN-HYDROCHLOROTHIAZIDE 80 MG-25 MG TAB
80-25 mg | ORAL_TABLET | Freq: Every day | ORAL | Status: DC
Start: 2012-02-10 — End: 2013-02-28

## 2012-02-10 MED ORDER — BETAMETHASONE DIPROPIONATE 0.05 % TOPICAL CREAM
0.05 % | Freq: Two times a day (BID) | CUTANEOUS | Status: AC
Start: 2012-02-10 — End: 2012-08-08

## 2012-02-10 NOTE — Progress Notes (Signed)
HISTORY OF PRESENT ILLNESS  Valerie Barry is a 71 y.o. female.  HPI  Hypertension  Hypertension ROS: taking medications as instructed, no medication side effects noted, no TIA's, no chest pain on exertion, no dyspnea on exertion, no swelling of ankles     reports that she has never smoked. She has never used smokeless tobacco.    reports that she does not drink alcohol.   BP Readings from Last 2 Encounters:   02/10/12 133/64   04/01/11 149/59     Reports recurrent poison ivy.   She takes precautions.  Using ivy stop detergent w/o relief.  Used cortisone.  She rubs the blisters off.      Hyperlipidemia  Currently she takes NO MEDS - was taking gemfibrozil mg  ROS: taking medications as instructed, no medication side effects noted  No new myalgias, no joint pains, no weakness  No TIA's, no chest pain on exertion, no dyspnea on exertion, no swelling of ankles.   Lab Results   Component Value Date/Time    Cholesterol, total 241 12/30/2010 10:39 AM    LDL, calculated 135 12/30/2010 10:39 AM    Triglyceride 166 12/30/2010 10:39 AM     No results found for this or any previous visit.    No hx bone density      Review of Systems   Constitutional: Negative for weight loss, malaise/fatigue and diaphoresis.   Eyes: Negative for blurred vision.   Respiratory: Negative for shortness of breath.    Cardiovascular: Negative for chest pain.   Gastrointestinal: Negative for abdominal pain.   Genitourinary: Negative for frequency and flank pain.   Musculoskeletal: Negative for myalgias.   Skin: Negative for rash.   Neurological: Negative for dizziness, weakness and headaches.   Psychiatric/Behavioral: The patient is not nervous/anxious.    All other systems reviewed and are negative.        Physical Exam   Nursing note and vitals reviewed.  Constitutional: No distress.   Neck: No JVD present. No thyromegaly present.   Cardiovascular: Normal rate, regular rhythm and intact distal pulses.  Exam reveals no gallop and no friction rub.     Pulmonary/Chest: Effort normal and breath sounds normal.   Abdominal: Soft. Normal aorta and bowel sounds are normal. She exhibits no abdominal bruit and no pulsatile midline mass. There is no tenderness.   Musculoskeletal: She exhibits no edema.   Skin: No rash noted.        Scattered linear vesicles on legs       ASSESSMENT and PLAN  Valerie Barry was seen today for follow-up and hypertension.    Diagnoses and associated orders for this visit:    Htn (hypertension)- controlled on current regimen  - telmisartan-hydrochlorothiazide (MICARDIS HCT) 80-25 mg per tablet; Take 1 Tab by mouth daily.  - METABOLIC PANEL, COMPREHENSIVE    Contact dermatitis- controlled on current regimen   - betamethasone dipropionate (DIPROSONE) 0.05 % topical cream; Apply  to affected area two (2) times a day for 180 days.    Mixed hyperlipidemia - on no meds  - LIPID PANEL    Screening for osteoporosis  - DEXA BONE DENSITY STUDY AXIAL; Future    Encounter for vitamin deficiency screening  - VITAMIN D, 25 HYDROXY    Other Orders  - naproxen sodium (ALEVE) 220 mg tablet; Take 220 mg by mouth two (2) times daily (with meals).

## 2012-02-11 LAB — METABOLIC PANEL, COMPREHENSIVE
A-G Ratio: 1.5 (ref 1.1–2.5)
ALT (SGPT): 8 IU/L (ref 0–32)
AST (SGOT): 9 IU/L (ref 0–40)
Albumin: 4 g/dL (ref 3.5–4.8)
Alk. phosphatase: 82 IU/L (ref 45–108)
BUN/Creatinine ratio: 20 (ref 11–26)
BUN: 15 mg/dL (ref 8–27)
Bilirubin, total: 0.4 mg/dL (ref 0.0–1.2)
CO2: 25 mmol/L (ref 19–28)
Calcium: 9.3 mg/dL (ref 8.6–10.2)
Chloride: 100 mmol/L (ref 97–108)
Creatinine: 0.76 mg/dL (ref 0.57–1.00)
GFR est non-AA: 78 mL/min/{1.73_m2} (ref >59)
GLOBULIN, TOTAL: 2.6 g/dL (ref 1.5–4.5)
Glucose: 110 mg/dL — ABNORMAL HIGH (ref 65–99)
Potassium: 3.9 mmol/L (ref 3.5–5.2)
Protein, total: 6.6 g/dL (ref 6.0–8.5)
Sodium: 137 mmol/L (ref 134–144)
eGFR If African American: 90 mL/min/{1.73_m2} (ref >59)

## 2012-02-11 LAB — LIPID PANEL
Cholesterol, total: 229 mg/dL — ABNORMAL HIGH (ref 100–199)
HDL Cholesterol: 58 mg/dL (ref >39)
LDL, calculated: 128 mg/dL — ABNORMAL HIGH (ref 0–99)
Triglyceride: 216 mg/dL — ABNORMAL HIGH (ref 0–149)
VLDL, calculated: 43 mg/dL — ABNORMAL HIGH (ref 5–40)

## 2012-02-11 LAB — VITAMIN D, 25 HYDROXY: VITAMIN D, 25-HYDROXY: 14.2 ng/mL — ABNORMAL LOW (ref 30.0–100.0)

## 2012-02-16 MED ORDER — CHOLECALCIFEROL (VITAMIN D3) 50,000 UNIT CAPSULE
ORAL_CAPSULE | ORAL | Status: AC
Start: 2012-02-16 — End: 2012-04-05

## 2012-03-25 NOTE — Telephone Encounter (Signed)
Error, 

## 2012-08-16 DIAGNOSIS — N62 Hypertrophy of breast: Secondary | ICD-10-CM | POA: Insufficient documentation

## 2012-10-17 ENCOUNTER — Other Ambulatory Visit: Payer: Self-pay

## 2012-10-17 DIAGNOSIS — Z1231 Encounter for screening mammogram for malignant neoplasm of breast: Secondary | ICD-10-CM

## 2012-11-11 ENCOUNTER — Ambulatory Visit: Payer: Medicaid Other

## 2012-11-14 ENCOUNTER — Ambulatory Visit: Payer: Medicaid Other

## 2012-12-02 ENCOUNTER — Ambulatory Visit
Admission: RE | Admit: 2012-12-02 | Discharge: 2012-12-02 | Disposition: A | Discharge status: Home / Self Care | Payer: Medicaid Other | Source: Ambulatory Visit

## 2012-12-02 DIAGNOSIS — Z1231 Encounter for screening mammogram for malignant neoplasm of breast: Secondary | ICD-10-CM

## 2013-07-10 ENCOUNTER — Other Ambulatory Visit: Payer: Self-pay | Admitting: Physician Assistant

## 2013-07-10 ENCOUNTER — Ambulatory Visit
Admission: RE | Admit: 2013-07-10 | Discharge: 2013-07-10 | Disposition: A | Discharge status: Home / Self Care | Payer: Medicare Other | Source: Ambulatory Visit | Attending: Physician Assistant | Admitting: Physician Assistant

## 2013-07-10 DIAGNOSIS — R05 Cough: Secondary | ICD-10-CM

## 2013-07-10 DIAGNOSIS — R059 Cough, unspecified: Secondary | ICD-10-CM

## 2013-10-23 MED ORDER — LOSARTAN-HYDROCHLOROTHIAZIDE 100 MG-25 MG TAB
100-25 mg | ORAL_TABLET | Freq: Every day | ORAL | Status: DC
Start: 2013-10-23 — End: 2014-04-30

## 2013-10-23 MED ORDER — ZOSTER VACCINE LIVE (PF) 19,400 UNIT SUB-Q SOLN
19400 unit/0.65 mL | Freq: Once | SUBCUTANEOUS | Status: AC
Start: 2013-10-23 — End: 2013-10-23

## 2013-10-23 MED ORDER — MULTIVIT WITH MINERALS-IRON-FA-LUTEIN 8 MG IRON-400 MCG-300 MCG TABLET
8 mg iron-400 mcg-300 mcg | ORAL_TABLET | ORAL | Status: DC
Start: 2013-10-23 — End: 2014-07-14

## 2013-10-23 NOTE — Progress Notes (Signed)
This is a Subsequent Medicare Annual Wellness Visit providing Personalized Prevention Plan Services (PPPS) (Performed 12 months after initial AWV and PPPS )    I have reviewed the patient's medical history in detail and updated the computerized patient record.     History     Past Medical History   Diagnosis Date   ??? Essential hypertension    ??? Hematuria 01/2010     Dr. York Cerise, due to catheter, blood clot. [place] nl 2012   ??? GERD (gastroesophageal reflux disease)    ??? Knee pain      L TKR 01/2010.  Dr. Stacie Acres   ??? Mixed hyperlipidemia    ??? Vitamin D deficiency 02/2012      Past Surgical History   Procedure Laterality Date   ??? Colonoscopy  01/2010     normal   ??? Hx knee replacement       left knee replacement, Dr. Stacie Acres   ??? Hx knee arthroscopy       x4     Current Outpatient Prescriptions   Medication Sig Dispense Refill   ??? MICARDIS HCT 80-25 mg per tablet take 1 tablet by mouth daily  30 Tab  11   ??? naproxen sodium (ALEVE) 220 mg tablet Take 220 mg by mouth two (2) times daily (with meals).       ??? aspirin 81 mg chewable tablet Take 1 Tab by mouth daily.  30 Tab  11     Allergies   Allergen Reactions   ??? Codeine Nausea Only   ??? Lisinopril Other (comments)     Facial flushing   ??? Losartan Other (comments)     Heads feels foggy    ??? Tylenol-Codeine #2 Nausea and Vomiting     Family History   Problem Relation Age of Onset   ??? Hypertension Mother    ??? Hypertension Brother    ??? Kidney Disease Mother    ??? Heart Disease Mother      afib     History   Substance Use Topics   ??? Smoking status: Never Smoker    ??? Smokeless tobacco: Never Used   ??? Alcohol Use: No     Patient Active Problem List   Diagnosis Code   ??? Knee pain 719.46   ??? Mixed hyperlipidemia 272.2   ??? HTN (hypertension) 401.9   ??? Vitamin d deficiency 268.9       Depression Risk Factor Screening:     PHQ 2 / 9, over the last two weeks 10/23/2013   Little interest or pleasure in doing things Not at all   Feeling down, depressed or hopeless Not at all   Total Score PHQ 2 0      Alcohol Risk Factor Screening:   no      Functional Ability and Level of Safety:     Hearing Loss   normal-to-mild    Activities of Daily Living   Self-care.   Requires assistance with: no ADLs    Fall Risk     Fall Risk Assessment, last 12 mths 10/23/2013   Able to walk? Yes   Fall in past 12 months? No     Abuse Screen   does not voice concern    Review of Systems   No- MAWV    Physical Examination     Evaluation of Cognitive Function:  Mood/affect:  neutral  Appearance: age appropriate and casually dressed  Family member/caregiver input: no    No exam performed today,  [place].    Patient Care Team:  Maximino Greenland, MD as PCP - General (Internal Medicine)    Advice/Referrals/Counseling   Education and counseling provided:  Are appropriate based on today's review and evaluation  End-of-Life planning (with patient's consent)  Pneumococcal Vaccine  Influenza Vaccine  Screening Mammography  Colorectal cancer screening tests  Cardiovascular screening blood test  Bone mass measurement (DEXA)  Screening for glaucoma  Diabetes screening test    Assessment/Plan       ICD-9-CM   1. Routine general medical examination at a health care facility V70.0   2. Screening for depression V79.0   .  Medication reconciliation to be completed by provider. Patient provided with Advance Directive information. Request patient provide copy to office so that medical record may be updated.   Patient is aware of outstanding HM. Patient states she is unable to address these at this time d/t family issues at this time. Patient states she and her brother are the primary caregivers of patients mother.

## 2013-10-23 NOTE — Patient Instructions (Addendum)
Medicare Part B Preventive Services Limitations  Scheduled   Bone Mass Measurement  (age 75 & older, biennial) Requires diagnosis related to osteoporosis or estrogen deficiency. Biennial benefit unless patient has history of long-term glucocorticoid tx or baseline is needed because initial test was by other method  Need to schedule when convenient.    Cardiovascular Screening Blood Tests (every 5 years)  Total cholesterol, HDL, Triglycerides Order as a panel if possible  Completed 2013   Colorectal Cancer Screening  -Fecal occult blood test (annual)  -Flexible sigmoidoscopy (5y)  -Screening colonoscopy (10y)  -Barium Enema   Completed 2012   Counseling to Prevent Tobacco Use (up to 8 sessions per year)  - Counseling greater than 3 and up to 10 minutes  - Counseling greater than 10 minutes Patients must be asymptomatic of tobacco-related conditions to receive as preventive service  no   Diabetes Screening Tests (at least every 3 years, Medicare covers annually or at 69-month intervals for prediabetic patients)    Fasting blood sugar (FBS) or glucose tolerance test (GTT) Patient must be diagnosed with one of the following:  -Hypertension, Dyslipidemia, obesity, previous impaired FBS or GTT  ???Or any two of the following: overweight, FH of diabetes, age ?88, history of gestational diabetes, birth of baby weighing more than 9 pounds  Completed   2013   Diabetes Self-Management Training (DSMT) (no USPSTF recommendation) Requires referral by treating physician for patient with diabetes or renal disease. 10 hours of initial DSMT session of no less than 30 minutes each in a continuous 55-month period.  2 hours of follow-up DSMT in subsequent years.  no   Glaucoma Screening (no USPSTF recommendation) Diabetes mellitus, family history, African American, age 21 or over, Hispanic American, age 3 or over  Need to schedule    Human Immunodeficiency Virus (HIV) Screening (annually for increased risk patients)  HIV-1 and HIV-2 by  EIA, ELISA, rapid antibody test, or oral mucosa transudate Patient must be at increased risk for HIV infection per USPSTF guidelines or pregnant.  Tests covered annually for patients at increased risk.  Pregnant patients may receive up to 3 test during pregnancy.  no   Medical Nutrition Therapy (MNT) (for diabetes or renal disease not recommended schedule) Requires referral by treating physician for patient with diabetes or renal disease.  Can be provided in same year as diabetes self-management training (DSMT), and CMS recommends medical nutrition therapy take place after DSMT.  Up to 3 hours for initial year and 2 hours in subsequent years.  no   Prostate Cancer Screening (annually up to age 68)  - Digital rectal exam (DRE)  - Prostate specific antigen (PSA) Annually (age 52 or over), DRE not paid separately when covered E/M service is provided on same date  N/a    Seasonal Influenza Vaccination (annually)   Completed    Pneumococcal Vaccination (once after 20)   Completed    Hepatitis B Vaccinations (if medium/high risk) Medium/high risk factors:  End-stage renal disease,  Hemophiliacs who received Factor VIII or IX concentrates, Clients of institutions for the mentally retarded, Persons who live in the same house as a HepB virus carrier, Homosexual men, Illicit injectable drug abusers.  No    Screening Mammography (biennial age 29-74)? Annually (age 30 or over)  Need to schedule when convenient    Screening Pap Tests and Pelvic Examination (up to age 44 and after 32 if unknown history or abnormal study last 10 years) Every 24 months except high risk  no   Ultrasound Screening for Abdominal Aortic Aneurysm (AAA) (once) Patient must be referred through IPPE and not have had a screening for abdominal aortic aneurysm before under Medicare.  Limited to patients who meet one of the following criteria:  - Men who are 47-38 years old and have smoked more than 100 cigarettes in their lifetime.  -Anyone with a FH of AAA   -Anyone recommended for screening by USPSTF  no   Family Practice Management 2011    Please provide our office a copy of your Advance Medical Directive so that we may update your medical record. Please contact our office with any questions.     Health Maintenance Due   Topic Date Due   ??? Tdap Vaccine  12/27/1957   ??? Td Vaccine  12/27/1957   ??? Stool testing for trace blood  12/27/1988   ??? Shingles Vaccine  12/28/1998   ??? Glaucoma Screening   12/28/2003   ??? Pneumonia Vaccine  12/28/2003   ??? Bone Density Screening  12/28/2003   ??? Annual Well Visit  12/28/2003   ??? Flu Vaccine  02/10/2013

## 2013-10-23 NOTE — Progress Notes (Signed)
HISTORY OF PRESENT ILLNESS    Hypertension  Hypertension ROS: no medication side effects noted, no TIA's, no chest pain on exertion, no dyspnea on exertion, no swelling of ankles     reports that she has never smoked. She has never used smokeless tobacco.    reports that she does not drink alcohol.   BP Readings from Last 2 Encounters:   10/23/13 152/62   02/10/12 133/64       Review of Systems   All other systems reviewed and are negative, except as noted in HPI    Past Medical and Surgical History   has a past medical history of Essential hypertension; Hematuria (01/2010); GERD (gastroesophageal reflux disease); Knee pain; Mixed hyperlipidemia; and Vitamin D deficiency (02/2012).   has past surgical history that includes colonoscopy (01/2010); knee replacement; and knee arthroscopy.     reports that she has never smoked. She has never used smokeless tobacco. She reports that she does not drink alcohol or use illicit drugs.    Physical Exam   Nursing note and vitals reviewed.  Blood pressure 152/62, pulse 76, temperature 97.7 ??F (36.5 ??C), temperature source Oral, resp. rate 12, height 5' 3.75" (1.619 m), weight 197 lb (89.359 kg), SpO2 99 %.  Constitutional: In no distress.    Eyes: Conjunctivae are normal.  HEENT:  No LAD or thyromegaly   Cardiovascular: Normal rate.  regular rhythm.  No murmurs  No edema  Pulmonary/Chest: Effort normal. clear to ausculation blaterally  Musculoskeletal:  no edema.  Abd:  Neurological: Alert and oriented.  Grossly intact cranial nerves and motor function.     Skin: No rash noted.   Psychiatric: Normal mood and affect. Behavior is normal.     ASSESSMENT and PLAN  Valerie Barry was seen today for medication problem and annual wellness visit.    Diagnoses and associated orders for this visit:      Essential hypertension- BP is controlled.  Micardis is not covered by insurance.    - losartan-hydrochlorothiazide (HYZAAR) 100-25 mg per tablet; Take 1 Tab by mouth daily. Replaces micardis-HCTZ  2/45/80  - METABOLIC PANEL, COMPREHENSIVE    Mixed hyperlipidemia - based on my history, the overall control of this problem unclear  - HEMOGLOBIN A1C  - LIPID PANEL    IFG (impaired fasting glucose)- The patient is asked to make an attempt to improve diet and exercise patterns to aid in medical management of this problem.  - HEMOGLOBIN A1C    Vitamin D deficiency  - VITAMIN D, 25 HYDROXY  - multivit-min-iron-FA-lutein (CENTRUM SILVER WOMEN) 8 mg iron-400 mcg-300 mcg tab; Take 1 daily        There are no Patient Instructions on file for this visit.    lab results and schedule of future lab studies reviewed with patient  reviewed medications and side effects in detail    Return to clinic for further evaluation if new symptoms develop or if current symptoms worsen or fail to resolve.

## 2013-10-24 LAB — METABOLIC PANEL, COMPREHENSIVE
A-G Ratio: 1.7 (ref 1.1–2.5)
ALT (SGPT): 12 IU/L (ref 0–32)
AST (SGOT): 16 IU/L (ref 0–40)
Albumin: 4 g/dL (ref 3.5–4.8)
Alk. phosphatase: 77 IU/L (ref 39–117)
BUN/Creatinine ratio: 22 (ref 11–26)
BUN: 17 mg/dL (ref 8–27)
Bilirubin, total: 0.4 mg/dL (ref 0.0–1.2)
CO2: 28 mmol/L (ref 18–29)
Calcium: 9.3 mg/dL (ref 8.7–10.3)
Chloride: 100 mmol/L (ref 97–108)
Creatinine: 0.76 mg/dL (ref 0.57–1.00)
GFR est AA: 89 mL/min/{1.73_m2} (ref >59)
GFR est non-AA: 78 mL/min/{1.73_m2} (ref >59)
GLOBULIN, TOTAL: 2.3 g/dL (ref 1.5–4.5)
Glucose: 102 mg/dL — ABNORMAL HIGH (ref 65–99)
Potassium: 4.3 mmol/L (ref 3.5–5.2)
Protein, total: 6.3 g/dL (ref 6.0–8.5)
Sodium: 140 mmol/L (ref 134–144)

## 2013-10-24 LAB — CVD REPORT

## 2013-10-24 LAB — LIPID PANEL
Cholesterol, total: 219 mg/dL — ABNORMAL HIGH (ref 100–199)
HDL Cholesterol: 72 mg/dL (ref >39)
LDL, calculated: 127 mg/dL — ABNORMAL HIGH (ref 0–99)
Triglyceride: 99 mg/dL (ref 0–149)
VLDL, calculated: 20 mg/dL (ref 5–40)

## 2013-10-24 LAB — HEMOGLOBIN A1C WITH EAG: Hemoglobin A1c: 6 % — ABNORMAL HIGH (ref 4.8–5.6)

## 2013-10-31 ENCOUNTER — Ambulatory Visit: Payer: Medicare Other | Admitting: Cardiology

## 2013-11-01 ENCOUNTER — Encounter: Payer: Self-pay | Admitting: General Surgery

## 2013-11-01 DIAGNOSIS — R079 Chest pain, unspecified: Secondary | ICD-10-CM

## 2013-11-01 DIAGNOSIS — I10 Essential (primary) hypertension: Secondary | ICD-10-CM | POA: Insufficient documentation

## 2013-11-01 DIAGNOSIS — I739 Peripheral vascular disease, unspecified: Secondary | ICD-10-CM | POA: Insufficient documentation

## 2013-11-02 ENCOUNTER — Ambulatory Visit (INDEPENDENT_AMBULATORY_CARE_PROVIDER_SITE_OTHER): Payer: Medicare Other | Admitting: Cardiology

## 2013-11-02 ENCOUNTER — Encounter: Payer: Self-pay | Admitting: Cardiology

## 2013-11-02 ENCOUNTER — Encounter (INDEPENDENT_AMBULATORY_CARE_PROVIDER_SITE_OTHER): Payer: Self-pay

## 2013-11-02 VITALS — BP 154/72 | HR 65 | Ht 61.0 in | Wt 138.0 lb

## 2013-11-02 DIAGNOSIS — R002 Palpitations: Secondary | ICD-10-CM

## 2013-11-02 DIAGNOSIS — E7800 Pure hypercholesterolemia, unspecified: Secondary | ICD-10-CM

## 2013-11-02 DIAGNOSIS — I10 Essential (primary) hypertension: Secondary | ICD-10-CM

## 2013-11-02 DIAGNOSIS — R079 Chest pain, unspecified: Secondary | ICD-10-CM

## 2013-11-02 NOTE — Progress Notes (Signed)
[address] [place], [place]  [address] Phone: [phone] Fax:  [phone]  Date:  11/02/2013   ID:  Cindy NgShirley L Metheny, DOB 1941/04/05, MRN [id number]  PCP:  Astrid DivineGRIFFIN,[name] COLLINS, MD  Cardiologist:  Armanda Magicraci , MD     History of Present Illness: [name] is a 73 y.o. female with a history of PAD, HTN, dyslipidemia and atypical chest pain with normal nuclear stress test in 2009 who now presents for evaluation of chest pain.  She has been having a feeling in her chest for about 5 months that she describes as a feeling indigestion and tooth ache in her chest with activity and at rest associated with mild SOB.  When she tries to massage her chest she will notice that her heart is racing.  She denies any nausea or diaphoresis.  She says that it occurs daily up to 3-4 times daily and then may skip a day.  She denies any dizziness or syncope.  She occasionally has some LE edema by the end of the day.   Wt Readings from Last 3 Encounters:  No data found for Wt     Past Medical History  Diagnosis Date  . 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 Atypical chest pain     negative cardiolyte 09/2007 w Dr Mayford Knifeurner  . Posterior communicating artery aneurysm     followed by Dr Venetia MaxonStern  . Hypertension   . Hypercholesteremia     Current Outpatient Prescriptions  Medication Sig Dispense Refill  . Ascorbic Acid (VITAMIN C) 100 MG tablet Take 100 mg by mouth daily.      [name]. aspirin 325 MG tablet Take 325 mg by mouth daily.      [name]. atorvastatin (LIPITOR) 80 MG tablet Take 80 mg by mouth daily.      [name]. gabapentin (NEURONTIN) 300 MG capsule Take 300 mg by mouth 3 (three) times daily.      [name]. losartan-hydrochlorothiazide (HYZAAR) 100-25 MG per tablet Take 1 tablet by mouth daily.      . meloxicam (MOBIC) 7.5 MG tablet Take 7.5 mg by mouth as needed for pain (once or twice a day as needed for shoulder/neck pain).      .  Multiple Vitamin (MULTIVITAMIN) tablet Take 1 tablet by mouth daily.      . Omega-3 Fatty Acids (FISH OIL PO) Take by mouth.      [name]. omeprazole (PRILOSEC) 20 MG capsule Take 20 mg by mouth daily as needed.      [name]. SALINE NASAL SPRAY NA Place into the nose. 2 sprays per nostril every 3-4 hours      . vitamin B-12 (CYANOCOBALAMIN) 1000 MCG tablet Take 1,000 mcg by mouth daily.       No current facility-administered medications for this visit.    Allergies:    Allergies  Allergen Reactions  . Codeine   . Penicillins     Social History:  The patient  reports that she quit smoking about 9 years ago. She does not have any smokeless tobacco history on file. She reports that she drinks alcohol. She reports that she does not use illicit drugs.   Family History:  The patient's family history is not on file.   ROS:  Please see the history of present illness.      All other systems reviewed and negative.   PHYSICAL EXAM: VS:  There were no  vitals taken for this visit. Well nourished, well developed, in no acute distress HEENT: normal Neck: no JVD Cardiac:  normal S1, S2; RRR; no murmur Lungs:  clear to auscultation bilaterally, no wheezing, rhonchi or rales Abd: soft, nontender, no hepatomegaly Ext: no edema Skin: warm and dry Neuro:  CNs 2-12 intact, no focal abnormalities noted  EKG:     NSR with nonspecific T wave abnormality  ASSESSMENT AND PLAN:  1. Chest pain which sound atypical and seems to occur in setting of palpitations. She does hav CRF including dyslipidemia, HTN, family history of CAD and PVD.   - lexiscan myoview - she cannot walk on treadmill due to AKA 2. HTN - borderline control - she has not taken her meds this am - continue Hyzaar 3. Dyslipidemia - continue Atorvastatin 4. Palpitations - event monitor to assess for arrhythmias Followup with me PRN pending results of studies  Signed, Armanda Magicraci , MD 11/02/2013 10:33 AM

## 2013-11-02 NOTE — Patient Instructions (Signed)
Your physician recommends that you continue on your current medications as directed. Please refer to the Current Medication list given to you today.  Your physician has requested that you have a lexiscan myoview. For further information please visit [email or web address]. Please follow instruction sheet, as given.  Your physician has recommended that you wear an event monitor. Event monitors are medical devices that record the heart's electrical activity. Doctors most often us these monitors to diagnose arrhythmias. Arrhythmias are problems with the speed or rhythm of the heartbeat. The monitor is a small, portable device. You can wear one while you do your normal daily activities. This is usually used to diagnose what is causing palpitations/syncope (passing out).  Your physician recommends that you schedule a follow-up appointment PRN

## 2013-11-03 ENCOUNTER — Encounter: Payer: Self-pay | Admitting: Radiology

## 2013-11-03 ENCOUNTER — Encounter (INDEPENDENT_AMBULATORY_CARE_PROVIDER_SITE_OTHER): Payer: Medicare Other

## 2013-11-03 DIAGNOSIS — R002 Palpitations: Secondary | ICD-10-CM

## 2013-11-03 NOTE — Progress Notes (Signed)
Patient ID: [name], female   DOB: Nov 27, 1940, 73 y.o.   MRN: [id number] 30 day lifewatch monitor applied

## 2013-11-08 ENCOUNTER — Telehealth (HOSPITAL_COMMUNITY): Payer: Self-pay

## 2013-11-10 ENCOUNTER — Ambulatory Visit (HOSPITAL_COMMUNITY)
Admission: RE | Admit: 2013-11-10 | Discharge: 2013-11-10 | Disposition: A | Discharge status: Home / Self Care | Payer: Medicare Other | Source: Ambulatory Visit | Attending: Cardiovascular Disease | Admitting: Cardiovascular Disease

## 2013-11-10 DIAGNOSIS — R002 Palpitations: Secondary | ICD-10-CM | POA: Insufficient documentation

## 2013-11-10 DIAGNOSIS — R079 Chest pain, unspecified: Secondary | ICD-10-CM | POA: Insufficient documentation

## 2013-11-10 DIAGNOSIS — R0602 Shortness of breath: Secondary | ICD-10-CM | POA: Insufficient documentation

## 2013-11-10 DIAGNOSIS — R42 Dizziness and giddiness: Secondary | ICD-10-CM | POA: Insufficient documentation

## 2013-11-10 MED ORDER — TECHNETIUM TC 99M SESTAMIBI GENERIC - CARDIOLITE
30.8000 | Freq: Once | INTRAVENOUS | Status: AC | PRN
  Administered 2013-11-10: 30.8 via INTRAVENOUS

## 2013-11-10 MED ORDER — REGADENOSON 0.4 MG/5ML IV SOLN
0.4000 mg | Freq: Once | INTRAVENOUS | Status: AC
  Administered 2013-11-10: 0.4 mg via INTRAVENOUS

## 2013-11-10 MED ORDER — TECHNETIUM TC 99M SESTAMIBI GENERIC - CARDIOLITE
10.1000 | Freq: Once | INTRAVENOUS | Status: AC | PRN
  Administered 2013-11-10: 10.1 via INTRAVENOUS

## 2013-11-10 MED ORDER — AMINOPHYLLINE 25 MG/ML IV SOLN
75.0000 mg | Freq: Once | INTRAVENOUS | Status: AC
  Administered 2013-11-10: 75 mg via INTRAVENOUS

## 2013-11-10 NOTE — Procedures (Addendum)
[facility] [address] [place] [address] [phone]  Cardiology Nuclear Med Study  Anne NgShirley L Fields is a 73 y.o. female     MRN : [id number]     DOB: [birth year]  Procedure Date: 11/10/2013  Nuclear Med Background Indication for Stress Test:  Evaluation for Ischemia History:  No prior cardiac or respiratory history reported; Last NUC MPI in2009-results unknown-nonischemic Cardiac Risk Factors: Family History - CAD, History of Smoking, Hypertension, Lipids and PVD  Symptoms:  Chest Pain, Light-Headedness, Palpitations and SOB   Nuclear Pre-Procedure Caffeine/Decaff Intake:  7:00pm NPO After: 5:00am   IV Site: R Antecubital  IV 0.9% NS with Angio Cath:  22g  Chest Size (in):  n/a IV Started by: Berdie OgrenAmanda Wease, RN  Height: 5\' 1"  (1.549 m)  Cup Size: D  BMI:  Body mass index is 26.09 kg/(m^2). Weight:  138 lb (62.596 kg)   Tech Comments:  n/a    Nuclear Med Study 1 or 2 day study: 1 day  Stress Test Type:  Lexiscan  Order Authorizing Provider:  Armanda Magicraci Turner, MD   Resting Radionuclide: Technetium 8554m Sestamibi  Resting Radionuclide Dose: 10.1 mCi   Stress Radionuclide:  Technetium 7654m Sestamibi  Stress Radionuclide Dose: 30.8 mCi           Stress Protocol Rest HR: 61 Stress HR: 86  Rest BP: 168/84 Stress BP: 176/75  Exercise Time (min): n/a METS: n/a          Dose of Adenosine (mg):  n/a Dose of Lexiscan: 0.4 mg  Dose of Atropine (mg): n/a Dose of Dobutamine: n/a mcg/kg/min (at max HR)  Stress Test Technologist: Ernestene MentionGwen Farrington, CCT Nuclear Technologist: Gonzella LexPam Phillips, CNMT   Rest Procedure:  Myocardial perfusion imaging was performed at rest 45 minutes following the intravenous administration of Technetium 5954m Sestamibi. Stress Procedure:  The patient received IV Lexiscan 0.4 mg over 15-seconds.  Technetium 1454m Sestamibi injected at 30-seconds.  Patient experienced shortness of breath, stomach pain and was  administered 75 mg of Aminophylline IV at 5 minutes.  There were no significant changes with Lexiscan.  Quantitative spect images were obtained after a 45 minute delay.  Transient Ischemic Dilatation (Normal <1.22):  1.10 Lung/Heart Ratio (Normal <0.45):  0.30 QGS EDV:  47 ml QGS ESV:  11 ml LV Ejection Fraction: 77%       Rest ECG: NSR with non-specific ST-T wave changes  Stress ECG: No significant change from baseline ECG  QPS Raw Data Images:  Normal; no motion artifact; normal heart/lung ratio. Stress Images:  Normal homogeneous uptake in all areas of the myocardium. Rest Images:  Normal homogeneous uptake in all areas of the myocardium. Subtraction (SDS):  No evidence of ischemia.  Impression Exercise Capacity:  Lexiscan with no exercise. BP Response:  Normal blood pressure response. Clinical Symptoms:  No significant symptoms noted. ECG Impression:  No significant ST segment change suggestive of ischemia. Comparison with Prior Nuclear Study: No significant change from previous study  Overall Impression:  Normal stress nuclear study.  LV Wall Motion:  NL LV Function; NL Wall Motion   Runell GessJonathan J Berry, MD  11/10/2013 12:38 PM

## 2013-12-11 ENCOUNTER — Telehealth: Payer: Self-pay | Admitting: Cardiology

## 2013-12-11 NOTE — Telephone Encounter (Signed)
No answer at home phone. Will try again.

## 2013-12-11 NOTE — Telephone Encounter (Signed)
Called patient with heart monitor results per Dr. Mayford Knife.

## 2013-12-11 NOTE — Telephone Encounter (Signed)
Please let patient know that heart monitor showed NSR with occasional PVC's and PAC's and atrial couplets. With HR range 57-127bpm.  These are benign

## 2014-01-11 ENCOUNTER — Other Ambulatory Visit: Payer: Self-pay

## 2014-01-11 DIAGNOSIS — Z1231 Encounter for screening mammogram for malignant neoplasm of breast: Secondary | ICD-10-CM

## 2014-01-11 NOTE — Telephone Encounter (Signed)
Encounter complete. 

## 2014-01-19 ENCOUNTER — Encounter (INDEPENDENT_AMBULATORY_CARE_PROVIDER_SITE_OTHER): Payer: Self-pay

## 2014-01-19 ENCOUNTER — Ambulatory Visit
Admission: RE | Admit: 2014-01-19 | Discharge: 2014-01-19 | Disposition: A | Discharge status: Home / Self Care | Payer: Medicare Other | Source: Ambulatory Visit

## 2014-01-19 DIAGNOSIS — Z1231 Encounter for screening mammogram for malignant neoplasm of breast: Secondary | ICD-10-CM

## 2014-04-30 MED ORDER — LOSARTAN-HYDROCHLOROTHIAZIDE 100 MG-25 MG TAB
100-25 mg | ORAL_TABLET | ORAL | Status: DC
Start: 2014-04-30 — End: 2014-05-07

## 2014-05-07 ENCOUNTER — Ambulatory Visit: Admit: 2014-05-07 | Discharge: 2014-05-07 | Payer: MEDICARE | Attending: Internal Medicine | Primary: Internal Medicine

## 2014-05-07 DIAGNOSIS — I10 Essential (primary) hypertension: Secondary | ICD-10-CM

## 2014-05-07 MED ORDER — VALSARTAN-HYDROCHLOROTHIAZIDE 320 MG-25 MG TAB
320-25 mg | ORAL_TABLET | Freq: Every day | ORAL | Status: DC
Start: 2014-05-07 — End: 2014-05-07

## 2014-05-07 MED ORDER — VALSARTAN-HYDROCHLOROTHIAZIDE 320 MG-25 MG TAB
320-25 mg | ORAL_TABLET | Freq: Every day | ORAL | Status: DC
Start: 2014-05-07 — End: 2014-05-17

## 2014-05-07 NOTE — Progress Notes (Signed)
HISTORY OF PRESENT ILLNESS    Chief Complaint   Patient presents with   ??? Medication Evaluation     BP       Presents for follow-up    Her mother passed away 12-18-2013.  She is depressed about it.  She is tearful often.   Reports low mood, low energy.   She is sleeping well.  He mother's cat died last week and this makes it even more difficult.    Hypertension  Hypertension ROS: taking medications as instructed, no medication side effects noted, no TIA's, no chest pain on exertion, no dyspnea on exertion, no swelling of ankles     reports that she has never smoked. She has never used smokeless tobacco.    reports that she does not drink alcohol.   BP Readings from Last 2 Encounters:   05/07/14 168/76   10/23/13 152/62         Review of Systems   All other systems reviewed and are negative, except as noted in HPI    Past Medical and Surgical History   has a past medical history of Essential hypertension; Hematuria (01/2010); GERD (gastroesophageal reflux disease); Knee pain; Mixed hyperlipidemia; and Vitamin D deficiency (02/2012).   has past surgical history that includes colonoscopy (01/2010); knee replacement; and knee arthroscopy.     reports that she has never smoked. She has never used smokeless tobacco. She reports that she does not drink alcohol or use illicit drugs.  family history includes Heart Disease in her mother; Hypertension in her brother and mother; Kidney Disease in her mother.    Physical Exam   Nursing note and vitals reviewed.  Blood pressure 168/76, pulse 70, temperature 98.1 ??F (36.7 ??C), temperature source Oral, resp. rate 18, height 5' 3.5" (1.613 m), weight 195 lb (88.451 kg), SpO2 97 %.  Constitutional:  No distress.    Eyes: Conjunctivae are normal.   Ears:  Hearing grossly intact  Cardiovascular: Normal rate.  regular rhythm, no murmurs or gallops  No edema  Pulmonary/Chest: Effort normal.   CTAB  Musculoskeletal: moves all 4 extremities    Neurological: Alert and oriented to person, place, and time.   Skin: No rash noted.   Psychiatric: depressed     ASSESSMENT and PLAN  Amery was seen today for medication evaluation.    Diagnoses and associated orders for this visit:    Essential hypertension  Blood pressure is uncontrolled. We'll change to Diovan. She reports some medication sensitivities and reluctant to try new class of medication. Could try amlodipine as next option.  - valsartan-hydrochlorothiazide (DIOVAN-HCT) 320-25 mg per tablet; Take 1 Tab by mouth daily. Replaces losartan-HCTZ    Reactive depression  Reassured. She declines medication therapy. Advise her to be active, interactive. Celebrate her mother's life.    Mixed hyperlipidemia  Uncontrolled.  Declines current medications.    Return to clinic in 3 months for fasting labs, enal function on new blood pressure medication      There are no Patient Instructions on file for this visit.    lab results and schedule of future lab studies reviewed with patient  reviewed medications and side effects in detail    Return to clinic for further evaluation if new symptoms develop    Follow-up Disposition: Not on File    Current Outpatient Prescriptions   Medication Sig   ??? valsartan-hydrochlorothiazide (DIOVAN-HCT) 320-25 mg per tablet Take 1 Tab by mouth daily. Replaces losartan-HCTZ   ??? multivit-min-iron-FA-lutein (CENTRUM SILVER WOMEN)  8 mg iron-400 mcg-300 mcg tab Take 1 daily     No current facility-administered medications for this visit.

## 2014-05-07 NOTE — Progress Notes (Signed)
Have you been to the ER or urgent care clinic since your last visit?   No     Have you been hospitalized since your last visit?  No     Have you been seen or consulted any other health care provider outside of [facility] since your last visit (including pap smears, colonoscopy screening)?   No

## 2014-05-10 ENCOUNTER — Telehealth

## 2014-05-10 NOTE — Telephone Encounter (Signed)
Pt states that the insurance company has to here from PCP's office to verify filling  the new BP medication Diovan, patient old BP medication had already been filled. Patient's insurance [employer] ID # [id number] [phone]

## 2014-05-17 MED ORDER — VALSARTAN-HYDROCHLOROTHIAZIDE 320 MG-25 MG TAB
320-25 mg | ORAL_TABLET | Freq: Every day | ORAL | Status: DC
Start: 2014-05-17 — End: 2014-07-14

## 2014-06-20 ENCOUNTER — Encounter (HOSPITAL_COMMUNITY): Payer: Medicare Other

## 2014-06-20 ENCOUNTER — Other Ambulatory Visit (HOSPITAL_COMMUNITY): Payer: Self-pay | Admitting: Cardiology

## 2014-06-20 ENCOUNTER — Other Ambulatory Visit (HOSPITAL_COMMUNITY): Payer: Medicare Other

## 2014-06-20 DIAGNOSIS — I739 Peripheral vascular disease, unspecified: Secondary | ICD-10-CM

## 2014-06-22 NOTE — Telephone Encounter (Signed)
Called pt and left VM

## 2014-06-22 NOTE — Telephone Encounter (Signed)
Patient is unable to take the Valsartan hydrochlorothiazide 320-25 mg. Her blood pressure is going up her no [phone] pleae call her

## 2014-06-25 ENCOUNTER — Other Ambulatory Visit (HOSPITAL_COMMUNITY): Payer: Self-pay | Admitting: Family Medicine

## 2014-06-25 ENCOUNTER — Ambulatory Visit (HOSPITAL_BASED_OUTPATIENT_CLINIC_OR_DEPARTMENT_OTHER): Payer: Medicare Other | Admitting: Radiology

## 2014-06-25 ENCOUNTER — Ambulatory Visit (HOSPITAL_COMMUNITY): Payer: Medicare Other | Attending: Family Medicine | Admitting: Cardiology

## 2014-06-25 DIAGNOSIS — I340 Nonrheumatic mitral (valve) insufficiency: Secondary | ICD-10-CM

## 2014-06-25 DIAGNOSIS — R002 Palpitations: Secondary | ICD-10-CM | POA: Diagnosis not present

## 2014-06-25 DIAGNOSIS — R12 Heartburn: Secondary | ICD-10-CM | POA: Insufficient documentation

## 2014-06-25 DIAGNOSIS — R0602 Shortness of breath: Secondary | ICD-10-CM | POA: Diagnosis not present

## 2014-06-25 DIAGNOSIS — E785 Hyperlipidemia, unspecified: Secondary | ICD-10-CM | POA: Diagnosis not present

## 2014-06-25 DIAGNOSIS — I739 Peripheral vascular disease, unspecified: Secondary | ICD-10-CM | POA: Diagnosis not present

## 2014-06-25 DIAGNOSIS — I10 Essential (primary) hypertension: Secondary | ICD-10-CM | POA: Diagnosis not present

## 2014-06-25 DIAGNOSIS — Z87891 Personal history of nicotine dependence: Secondary | ICD-10-CM | POA: Insufficient documentation

## 2014-06-25 NOTE — Progress Notes (Signed)
Lower arterial doppler performed. 

## 2014-06-25 NOTE — Progress Notes (Signed)
Echocardiogram performed.  

## 2014-07-13 ENCOUNTER — Encounter

## 2014-07-14 MED ORDER — MULTIVIT WITH MINERALS-IRON-FA-LUTEIN 8 MG IRON-400 MCG-300 MCG TABLET
8 mg iron-400 mcg-300 mcg | ORAL_TABLET | ORAL | Status: AC
Start: 2014-07-14 — End: ?

## 2014-07-14 MED ORDER — TELMISARTAN-HYDROCHLOROTHIAZIDE 80 MG-25 MG TAB
80-25 mg | ORAL_TABLET | ORAL | Status: DC
Start: 2014-07-14 — End: 2014-10-25

## 2014-07-14 NOTE — Addendum Note (Signed)
Addended by: Maximino Greenland on: 07/14/2014 06:19 PM      Modules accepted: Orders

## 2014-07-14 NOTE — Telephone Encounter (Signed)
From: Valerie Barry  To: Maximino Greenland, MD  Sent: 07/13/2014 6:27 PM EST  Subject:  Medication Renewal Request    Original  authorizing provider: Cyndie Chime, MD    Valerie Barry would like a refill of the following medications:  multivit-min-iron-FA-lutein  (CENTRUM SILVER WOMEN) 8 mg iron-400 mcg-300 mcg tab Cyndie Chime, MD]    Preferred  pharmacy: [facility] - [place], [place] - [address]    Comment:  Dr  Shanda Howells, the Valsartan or Luberta Robertson is not working for me. My prescription drug co this year will pay for the Micardis HCT80/25mg  you had me on prior. They would not pay for it in 2015. Would you put me back on that for a couple of months, and then I come back to you? The drug store I have to use is [facility] at [address], and the phone number is [phone]. Thank you [name]

## 2014-07-18 ENCOUNTER — Ambulatory Visit: Payer: Medicare Other | Admitting: Cardiology

## 2014-08-14 ENCOUNTER — Encounter: Payer: Self-pay | Admitting: Cardiology

## 2014-08-14 ENCOUNTER — Ambulatory Visit (INDEPENDENT_AMBULATORY_CARE_PROVIDER_SITE_OTHER): Payer: Medicare Other | Admitting: Cardiology

## 2014-08-14 DIAGNOSIS — I739 Peripheral vascular disease, unspecified: Secondary | ICD-10-CM

## 2014-08-14 DIAGNOSIS — I491 Atrial premature depolarization: Secondary | ICD-10-CM

## 2014-08-14 DIAGNOSIS — I10 Essential (primary) hypertension: Secondary | ICD-10-CM

## 2014-08-14 DIAGNOSIS — R002 Palpitations: Secondary | ICD-10-CM

## 2014-08-14 DIAGNOSIS — I493 Ventricular premature depolarization: Secondary | ICD-10-CM | POA: Insufficient documentation

## 2014-08-14 HISTORY — DX: Atrial premature depolarization: I49.1

## 2014-08-14 HISTORY — DX: Ventricular premature depolarization: I49.3

## 2014-08-14 NOTE — Patient Instructions (Signed)
Your physician recommends that you continue on your current medications as directed. Please refer to the Current Medication list given to you today.   Your physician wants you to follow-up in:  6 MONTHS  You will receive a reminder letter in the mail two months in advance. If you don't receive a letter, please call our office to schedule the follow-up appointment.    Your physician has recommended that you wear an event monitor 30 DAY . Event monitors are medical devices that record the heart's electrical activity. Doctors most often us these monitors to diagnose arrhythmias. Arrhythmias are problems with the speed or rhythm of the heartbeat. The monitor is a small, portable device. You can wear one while you do your normal daily activities. This is usually used to diagnose what is causing palpitations/syncope (passing out).

## 2014-08-14 NOTE — Progress Notes (Addendum)
Cardiology Office Note   Date:  08/14/2014   ID:  KYNSLIE RINGLE, DOB 09-20-1940, MRN [id number]  PCP:  [name], MD  Cardiologist:   Quintella Reichert, MD   Chief Complaint  Patient presents with  . Palpitations  . Chest Pain  . Hypertension      History of Present Illness: [name] is a 74 y.o. female with a history of PAD, HTN, dyslipidemia and atypical chest pain with normal nuclear stress test in 2009 who now presents for followup of chest pain. When I saw her last she had been having a feeling in her chest for about 5 months that she describes as a feeling indigestion and tooth ache in her chest with activity and at rest associated with mild SOB. When she tried to massage her chest she would notice that her heart was racing. She denied any nausea or diaphoresis. A nuclear stress test showed no ischemia and she also wore a heart monitor due to palpitations which showed PAC's and PVC's.  Dr. Valentina Lucks has sent her back to see me due to palpitations.  She says that the palpitations are different this time.  They occur a few times weekly and usually last about 10 minutes at a time.  She describes it as someone poking her.  She denies any dizziness or syncope.  She says that she does not have any pain in her chest but when she gets the palpitations her left arm will go partially numb with some mild pain.  When the palpitations stop the left arm pain stops as well.       Past Medical History  Diagnosis Date  . 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 Atypical chest pain     negative cardiolyte 09/2007 w Dr Mayford Knife  . Posterior communicating artery aneurysm     followed by Dr Venetia Maxon  . Hypertension   . Hypercholesteremia   . PAC (premature atrial contraction) 08/14/2014  . PVC's (premature ventricular contractions) 08/14/2014    Past Surgical History  Procedure Laterality Date  . 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 Ascorbic Acid (VITAMIN C) 100 MG tablet Take 100 mg by mouth daily.    [name] aspirin 325 MG tablet Take 325 mg by mouth daily.    [name] atorvastatin (LIPITOR) 80 MG tablet Take 80 mg by mouth daily.    . calcium carbonate (OS-CAL) 600 MG TABS tablet Take 600 mg by mouth daily with breakfast.    . gabapentin (NEURONTIN) 300 MG capsule Take 300 mg by mouth 3 (three) times daily.    [name] losartan-hydrochlorothiazide (HYZAAR) 100-25 MG per tablet Take 1 tablet by mouth daily.    . meloxicam (MOBIC) 7.5 MG tablet Take 7.5 mg by mouth as needed for pain (once or twice a day as needed for shoulder/neck pain).    . Multiple Vitamin (MULTIVITAMIN) tablet Take 1 tablet by mouth daily.    . Omega-3 Fatty Acids (FISH OIL PO) Take by mouth.    [name] omeprazole (PRILOSEC) 20 MG capsule Take 20 mg by mouth daily as needed.    [name] QVAR 80 MCG/ACT inhaler Inhale 1 puff into the lungs as needed. For shortness of breathe.  0  . SALINE NASAL SPRAY NA Place into the nose. 2 sprays per nostril every  3-4 hours    . vitamin B-12 (CYANOCOBALAMIN) 1000 MCG tablet Take 1,000 mcg by mouth daily.     No current facility-administered medications for this visit.    Allergies:   Codeine and Penicillins    Social History:  The patient  reports that she quit smoking about 10 years ago. She does not have any smokeless tobacco history on file. She reports that she drinks alcohol. She reports that she does not use illicit drugs.   Family History:  The patient's family history includes Heart attack in her brother, mother, and sister; Heart disease in her brother, mother, and sister.    ROS:  Please see the history of present illness.   Otherwise, review of systems are positive for none.   All other systems are reviewed and negative.   PHYSICAL EXAM: VS:  BP 152/68 mmHg  Pulse 72  Ht 5\' 2"  (1.575 m)  Wt 142 lb (64.411 kg)   BMI 25.97 kg/m2  SpO2 97% , BMI Body mass index is 25.97 kg/(m^2). GEN: Well nourished, well developed, in no acute distress HEENT: normal Neck: no JVD, carotid bruits, or masses Cardiac: RRR; no murmurs, rubs, or gallops,no edema  Respiratory:  clear to auscultation bilaterally, normal work of breathing GI: soft, nontender, nondistended, + BS MS: no deformity or atrophy Skin: warm and dry, no rash Neuro:  Strength and sensation are intact Psych: euthymic mood, full affect   EKG:  EKG is not ordered today.    Recent Labs: No results found for requested labs within last 365 days.    Lipid Panel No results found for: CHOL, TRIG, HDL, CHOLHDL, VLDL, LDLCALC, LDLDIRECT    Wt Readings from Last 3 Encounters:  08/14/14 142 lb (64.411 kg)  11/10/13 138 lb (62.596 kg)  11/02/13 138 lb (62.596 kg)       ASSESSMENT AND PLAN:  1.  Palpitations with history of PAC's and PVC's on heart monitor in the past but she says that the palpitations now are different with more fluttering.  I will get another heart monitor to rule out PAF or flutter 2.  Left arm pain that is not exertional and only occurs with the heart fluttering 3.  History of atypical CP with normal nuclear stress test a year ago. 4.  HTN borderline controlled- continue Hyzaar 5.  PAD s/p AKA   Current medicines are reviewed at length with the patient today.  The patient does not have concerns regarding medicines.  The following changes have been made:  no change  Labs/ tests ordered today include: event monitor     Disposition:   FU with me in 6 months   Signed, Quintella ReichertURNER, R, MD  08/14/2014 10:06 PM    [facility] [address], [place], [place]  [address] Phone: [phone](336) [phone]; Fax: [phone]

## 2014-08-16 ENCOUNTER — Encounter (INDEPENDENT_AMBULATORY_CARE_PROVIDER_SITE_OTHER): Payer: Medicare Other

## 2014-08-16 ENCOUNTER — Encounter: Payer: Self-pay | Admitting: *Deleted

## 2014-08-16 DIAGNOSIS — I493 Ventricular premature depolarization: Secondary | ICD-10-CM | POA: Diagnosis not present

## 2014-08-16 NOTE — Progress Notes (Signed)
Patient ID: Cindy NgShirley L Michalsky, female   DOB: 05/27/1941, 74 y.o.   MRN: [id number] Lifewatch 30 day cardiac event monitor applied to patient.

## 2014-08-24 ENCOUNTER — Telehealth: Payer: Self-pay | Admitting: Cardiology

## 2014-08-24 NOTE — Telephone Encounter (Signed)
Instructed representative to use diagnoses code for palpitations to cover heart monitor.  Agrees with treatment plan.

## 2014-08-24 NOTE — Telephone Encounter (Signed)
New message      Need an additional diagnosis for the heart monitor

## 2014-09-21 ENCOUNTER — Telehealth: Payer: Self-pay | Admitting: Cardiology

## 2014-09-21 NOTE — Telephone Encounter (Signed)
Left message to call back  

## 2014-09-21 NOTE — Telephone Encounter (Signed)
Please let patient know that heart monitor showed NSR with PVC's, PAC's and HR ranging from 57-129bpm.  Please reassure patient that PVC's and PAC's are benign.  We can start her on a medicine to suppress them if she would like

## 2014-09-26 NOTE — Telephone Encounter (Signed)
Phone kept ringing. Will try again later.

## 2014-09-27 NOTE — Telephone Encounter (Signed)
Left message to call back.  Letter sent for patient to call the office to obtain her monitor results.

## 2014-10-05 ENCOUNTER — Encounter: Payer: Self-pay | Admitting: Cardiology

## 2014-10-05 NOTE — Telephone Encounter (Signed)
This encounter was created in error - please disregard.

## 2014-10-05 NOTE — Telephone Encounter (Signed)
New problem    Pt want to know the results of her monitor.

## 2014-10-05 NOTE — Telephone Encounter (Signed)
Informed patient of results and verbal understanding expressed.   Patient denies any new medications at this time, but says she will call the office if she changes her mind.  Telephone encounter with monitor results forwarded to Dr. Valentina LucksGriffin per patient's request.

## 2014-10-25 ENCOUNTER — Inpatient Hospital Stay: Admit: 2014-11-13 | Payer: MEDICARE | Primary: Internal Medicine

## 2014-10-25 ENCOUNTER — Ambulatory Visit: Admit: 2014-10-25 | Discharge: 2014-10-25 | Payer: MEDICARE | Attending: Internal Medicine | Primary: Internal Medicine

## 2014-10-25 DIAGNOSIS — E782 Mixed hyperlipidemia: Secondary | ICD-10-CM

## 2014-10-25 MED ORDER — TELMISARTAN-HYDROCHLOROTHIAZIDE 80 MG-25 MG TAB
80-25 mg | ORAL_TABLET | ORAL | Status: DC
Start: 2014-10-25 — End: 2015-06-21

## 2014-10-25 NOTE — Progress Notes (Signed)
HISTORY OF PRESENT ILLNESS    Chief Complaint   Patient presents with   ??? Hypertension     broken blood vessels in legs/ fasting       Presents for follow-up    Hypertension  Hypertension ROS: taking medications as instructed, no medication side effects noted, no TIA's, no chest pain on exertion, no dyspnea on exertion, no swelling of ankles     reports that she has never smoked. She has never used smokeless tobacco.    reports that she does not drink alcohol.   BP Readings from Last 2 Encounters:   10/25/14 124/56   05/07/14 168/76     Hyperlipidemia  Takes no meds  No new myalgias, no joint pains, no weakness  No TIA's, no chest pain on exertion, no dyspnea on exertion, no swelling of ankles.   Lab Results   Component Value Date/Time    CHOLESTEROL, TOTAL 219 10/23/2013 12:10 PM    HDL CHOLESTEROL 72 10/23/2013 12:10 PM    LDL, CALCULATED 127 10/23/2013 12:10 PM    VLDL, CALCULATED 20 10/23/2013 12:10 PM    TRIGLYCERIDE 99 10/23/2013 12:10 PM     Impaired fasting glucose / Pre-diabetes follow-up  Lab Results   Component Value Date/Time    GLUCOSE 102 10/23/2013 12:10 PM     Last hemoglobin a1c   Lab Results   Component Value Date/Time    HEMOGLOBIN A1C 6.0 10/23/2013 12:10 PM     Last Point of Care HGB A1C  No components found for: POCA1C   Diabetic diet compliance: compliant most of the time. Patient does not perform home glucose monitoring.    Review of Systems   All other systems reviewed and are negative, except as noted in HPI    Past Medical and Surgical History   has a past medical history of Essential hypertension; Hematuria (01/2010); GERD (gastroesophageal reflux disease); Knee pain; Mixed hyperlipidemia; Vitamin D deficiency (02/2012); and Spider varicose veins.   has past surgical history that includes colonoscopy (01/2010); knee replacement; and knee arthroscopy.     reports that she has never smoked. She has never used smokeless tobacco. She reports that she does not drink alcohol or use illicit drugs.   family history includes Heart Disease in her mother; Hypertension in her brother and mother; Kidney Disease in her mother.    Physical Exam   Nursing note and vitals reviewed.  Blood pressure 124/56, pulse 72, temperature 98.7 ??F (37.1 ??C), resp. rate 12, height 5' 3.5" (1.613 m), weight 193 lb (87.544 kg).  Constitutional:  No distress.    Eyes: Conjunctivae are normal.   Ears:  Hearing grossly intact  Cardiovascular: Normal rate.  regular rhythm, no murmurs or gallops  No edema  Pulmonary/Chest: Effort normal.   CTAB  Musculoskeletal: moves all 4 extremities   Neurological: Alert and oriented to person, place, and time.   Skin: No rash noted.   Psychiatric: Normal mood and affect. Behavior is normal.     ASSESSMENT and PLAN  Valerie Barry was seen today for hypertension.    Diagnoses and all orders for this visit:    Mixed hyperlipidemia- The patient is asked to make an attempt to improve diet and exercise patterns to aid in medical management of this problem.  Orders:  -     METABOLIC PANEL, COMPREHENSIVE  -     LIPID PANEL  -     HEMOGLOBIN A1C WITH EAG    Essential hypertension- good!  Controlled on current  regimen.  Continue current medications as written in chart.  Orders:  -     telmisartan-hydrochlorothiazide (MICARDIS HCT) 80-25 mg per tablet; take 1 tablet by mouth daily    Vitamin D deficiency- taking a supplement, unclear dose  Orders:  -     VITAMIN D, 25 HYDROXY; Future    Spider varicose veins- mild of legs.  The patient is reassured that these symptoms do not appear to represent a serious or threatening condition.    IFG (impaired fasting glucose)- The patient is asked to make an attempt to improve diet and exercise patterns to aid in medical management of this problem  Orders:  -     HEMOGLOBIN A1C WITH EAG    Screening for osteoporosis  Orders:  -     DEXA BONE DENSITY STUDY AXIAL; Future    lab results and schedule of future lab studies reviewed with patient   reviewed medications and side effects in detail    Return to clinic for further evaluation if new symptoms develop    Current Outpatient Prescriptions   Medication Sig   ??? CHOLECALCIFEROL, VITAMIN D3, (VITAMIN D3 PO) Take  by mouth.   ??? telmisartan-hydrochlorothiazide (MICARDIS HCT) 80-25 mg per tablet take 1 tablet by mouth daily   ??? multivit-min-iron-FA-lutein (CENTRUM SILVER WOMEN) 8 mg iron-400 mcg-300 mcg tab Take 1 daily     No current facility-administered medications for this visit.

## 2014-10-26 LAB — METABOLIC PANEL, COMPREHENSIVE
A-G Ratio: 1.7 (ref 1.1–2.5)
ALT (SGPT): 11 IU/L (ref 0–32)
AST (SGOT): 13 IU/L (ref 0–40)
Albumin: 4.2 g/dL (ref 3.5–4.8)
Alk. phosphatase: 82 IU/L (ref 39–117)
BUN/Creatinine ratio: 31 — ABNORMAL HIGH (ref 11–26)
BUN: 21 mg/dL (ref 8–27)
Bilirubin, total: 0.5 mg/dL (ref 0.0–1.2)
CO2: 28 mmol/L (ref 18–29)
Calcium: 9.3 mg/dL (ref 8.7–10.3)
Chloride: 98 mmol/L (ref 97–108)
Creatinine: 0.68 mg/dL (ref 0.57–1.00)
GFR est AA: 99 mL/min/{1.73_m2} (ref >59)
GFR est non-AA: 86 mL/min/{1.73_m2} (ref >59)
GLOBULIN, TOTAL: 2.5 g/dL (ref 1.5–4.5)
Glucose: 105 mg/dL — ABNORMAL HIGH (ref 65–99)
Potassium: 4.2 mmol/L (ref 3.5–5.2)
Protein, total: 6.7 g/dL (ref 6.0–8.5)
Sodium: 140 mmol/L (ref 134–144)

## 2014-10-26 LAB — HEMOGLOBIN A1C WITH EAG
Estimated average glucose: 123 mg/dL
Hemoglobin A1c: 5.9 % — ABNORMAL HIGH (ref 4.8–5.6)

## 2014-10-26 LAB — LIPID PANEL
Cholesterol, total: 232 mg/dL — ABNORMAL HIGH (ref 100–199)
HDL Cholesterol: 71 mg/dL (ref >39)
LDL, calculated: 134 mg/dL — ABNORMAL HIGH (ref 0–99)
Triglyceride: 134 mg/dL (ref 0–149)
VLDL, calculated: 27 mg/dL (ref 5–40)

## 2014-10-26 LAB — VITAMIN D, 25 HYDROXY: VITAMIN D, 25-HYDROXY: 18.6 ng/mL — ABNORMAL LOW (ref 30.0–100.0)

## 2014-10-26 LAB — CVD REPORT

## 2014-11-06 ENCOUNTER — Encounter: Payer: Self-pay | Admitting: Cardiology

## 2015-03-08 ENCOUNTER — Other Ambulatory Visit: Payer: Self-pay

## 2015-03-08 DIAGNOSIS — Z1231 Encounter for screening mammogram for malignant neoplasm of breast: Secondary | ICD-10-CM

## 2015-04-03 ENCOUNTER — Ambulatory Visit
Admission: RE | Admit: 2015-04-03 | Discharge: 2015-04-03 | Disposition: A | Discharge status: Home / Self Care | Payer: Medicare Other | Source: Ambulatory Visit

## 2015-04-03 DIAGNOSIS — Z1231 Encounter for screening mammogram for malignant neoplasm of breast: Secondary | ICD-10-CM

## 2015-06-21 ENCOUNTER — Inpatient Hospital Stay: Admit: 2015-08-29 | Payer: MEDICARE | Primary: Internal Medicine

## 2015-06-21 ENCOUNTER — Ambulatory Visit: Admit: 2015-06-21 | Discharge: 2015-06-21 | Payer: MEDICARE | Attending: Internal Medicine | Primary: Internal Medicine

## 2015-06-21 DIAGNOSIS — I10 Essential (primary) hypertension: Secondary | ICD-10-CM

## 2015-06-21 MED ORDER — TELMISARTAN-HYDROCHLOROTHIAZIDE 80 MG-25 MG TAB
80-25 mg | ORAL_TABLET | ORAL | 5 refills | Status: DC
Start: 2015-06-21 — End: 2015-12-30

## 2015-06-21 MED ORDER — CHOLECALCIFEROL (VITAMIN D3) 2,000 UNIT CAPSULE
ORAL_CAPSULE | Freq: Every day | ORAL | 5 refills | Status: AC
Start: 2015-06-21 — End: ?

## 2015-06-21 NOTE — Progress Notes (Signed)
Patient presents for a follow up on HTN.  She is fasting for labs.

## 2015-06-21 NOTE — Progress Notes (Signed)
HISTORY OF PRESENT ILLNESS    Chief Complaint   Patient presents with   ??? Hypertension       Presents for follow-up    Hypertension  Hypertension ROS: taking medications as instructed, no medication side effects noted, no TIA's, no chest pain on exertion, no dyspnea on exertion, no swelling of ankles     reports that she has never smoked. She has never used smokeless tobacco.    reports that she does not drink alcohol.   BP Readings from Last 2 Encounters:   06/21/15 130/82   10/25/14 124/56     Vit D def - taking 2000 units daily most of the time and a MVI    Hyperlipidemia  No new myalgias, no joint pains, no weakness  No TIA's, no chest pain on exertion, no dyspnea on exertion, no swelling of ankles.   Lab Results   Component Value Date/Time    CHOLESTEROL, TOTAL 232 10/25/2014 12:00 AM    HDL CHOLESTEROL 71 10/25/2014 12:00 AM    LDL, CALCULATED 134 10/25/2014 12:00 AM    VLDL, CALCULATED 27 10/25/2014 12:00 AM    TRIGLYCERIDE 134 10/25/2014 12:00 AM           Review of Systems   All other systems reviewed and are negative, except as noted in HPI    Past Medical and Surgical History   has a past medical history of Essential hypertension; GERD (gastroesophageal reflux disease); Hematuria (01/2010); Knee pain; Mixed hyperlipidemia; Spider varicose veins; and Vitamin D deficiency (02/2012).   has a past surgical history that includes colonoscopy (01/2010); knee replacement; and knee arthroscopy.     reports that she has never smoked. She has never used smokeless tobacco. She reports that she does not drink alcohol or use illicit drugs.  family history includes Heart Disease in her mother; Hypertension in her brother and mother; Kidney Disease in her mother.    Physical Exam   Nursing note and vitals reviewed.  Blood pressure 153/61, pulse 72, temperature 97.7 ??F (36.5 ??C), temperature source Oral, resp. rate 12, height 5' 3.5" (1.613 m), weight 198 lb 9.6 oz (90.1 kg).  Constitutional:  No distress.     Eyes: Conjunctivae are normal.   Ears:  Hearing grossly intact  Cardiovascular: Normal rate.  regular rhythm, no murmurs or gallops  No edema  Pulmonary/Chest: Effort normal.   CTAB  Musculoskeletal: moves all 4 extremities   Neurological: Alert and oriented to person, place, and time.   Skin: No rash noted.   Psychiatric: Normal mood and affect. Behavior is normal.     ASSESSMENT and PLAN  Valerie Barry was seen today for hypertension.    Diagnoses and all orders for this visit:    Essential hypertension-Controlled on current regimen.  Continue current medications as written in chart.  Chest tried multiple other medications without good results, she is willing to pay for this.  -     telmisartan-hydroCHLOROthiazide (MICARDIS HCT) 80-25 mg per tablet; take 1 tablet by mouth daily  -     CBC W/O DIFF  -     METABOLIC PANEL, COMPREHENSIVE    Vitamin D deficiency  Variable compliance with vitamin DSupplement.  -     VITAMIN D, 25 HYDROXY  -     Cholecalciferol, Vitamin D3, (VITAMIN D3) 2,000 unit cap capsule; Take 2,000 Units by mouth daily.    Mixed hyperlipidemia  Continue diet and exercise. Decently controlled without medications.  -     LIPID  PANEL        There are no Patient Instructions on file for this visit.    lab results and schedule of future lab studies reviewed with patient  reviewed medications and side effects in detail    Return to clinic for further evaluation if new symptoms develop    Follow-up Disposition: Not on File    Current Outpatient Prescriptions   Medication Sig   ??? CHOLECALCIFEROL, VITAMIN D3, (VITAMIN D3 PO) Take  by mouth.   ??? telmisartan-hydrochlorothiazide (MICARDIS HCT) 80-25 mg per tablet take 1 tablet by mouth daily   ??? multivit-min-iron-FA-lutein (CENTRUM SILVER WOMEN) 8 mg iron-400 mcg-300 mcg tab Take 1 daily     No current facility-administered medications for this visit.    Up

## 2015-06-22 LAB — LIPID PANEL
Cholesterol, total: 259 mg/dL — ABNORMAL HIGH (ref 100–199)
HDL Cholesterol: 72 mg/dL (ref >39)
LDL, calculated: 158 mg/dL — ABNORMAL HIGH (ref 0–99)
Triglyceride: 144 mg/dL (ref 0–149)
VLDL, calculated: 29 mg/dL (ref 5–40)

## 2015-06-22 LAB — CBC W/O DIFF
HCT: 41.8 % (ref 34.0–46.6)
HGB: 13.7 g/dL (ref 11.1–15.9)
MCH: 28.4 pg (ref 26.6–33.0)
MCHC: 32.8 g/dL (ref 31.5–35.7)
MCV: 87 fL (ref 79–97)
PLATELET: 241 10*3/uL (ref 150–379)
RBC: 4.82 x10E6/uL (ref 3.77–5.28)
RDW: 13.8 % (ref 12.3–15.4)
WBC: 7.1 10*3/uL (ref 3.4–10.8)

## 2015-06-22 LAB — METABOLIC PANEL, COMPREHENSIVE
A-G Ratio: 1.9 (ref 1.1–2.5)
ALT (SGPT): 12 IU/L (ref 0–32)
AST (SGOT): 15 IU/L (ref 0–40)
Albumin: 4.1 g/dL (ref 3.5–4.8)
Alk. phosphatase: 80 IU/L (ref 39–117)
BUN/Creatinine ratio: 21 (ref 11–26)
BUN: 16 mg/dL (ref 8–27)
Bilirubin, total: 0.3 mg/dL (ref 0.0–1.2)
CO2: 31 mmol/L — ABNORMAL HIGH (ref 18–29)
Calcium: 9.5 mg/dL (ref 8.7–10.3)
Chloride: 99 mmol/L (ref 97–106)
Creatinine: 0.76 mg/dL (ref 0.57–1.00)
GFR est AA: 88 mL/min/{1.73_m2} (ref >59)
GFR est non-AA: 76 mL/min/{1.73_m2} (ref >59)
GLOBULIN, TOTAL: 2.2 g/dL (ref 1.5–4.5)
Glucose: 106 mg/dL — ABNORMAL HIGH (ref 65–99)
Potassium: 4.3 mmol/L (ref 3.5–5.2)
Protein, total: 6.3 g/dL (ref 6.0–8.5)
Sodium: 141 mmol/L (ref 136–144)

## 2015-06-22 LAB — CVD REPORT

## 2015-06-22 LAB — VITAMIN D, 25 HYDROXY: VITAMIN D, 25-HYDROXY: 19.3 ng/mL — ABNORMAL LOW (ref 30.0–100.0)

## 2015-12-30 ENCOUNTER — Encounter

## 2015-12-30 MED ORDER — TELMISARTAN-HYDROCHLOROTHIAZIDE 80 MG-25 MG TAB
80-25 mg | ORAL_TABLET | ORAL | 0 refills | Status: DC
Start: 2015-12-30 — End: 2016-01-09

## 2016-01-09 ENCOUNTER — Ambulatory Visit: Admit: 2016-01-09 | Discharge: 2016-01-09 | Payer: MEDICARE | Attending: Internal Medicine | Primary: Internal Medicine

## 2016-01-09 DIAGNOSIS — Z0000 Encounter for general adult medical examination without abnormal findings: Secondary | ICD-10-CM

## 2016-01-09 MED ORDER — TELMISARTAN-HYDROCHLOROTHIAZIDE 80 MG-25 MG TAB
80-25 mg | ORAL_TABLET | ORAL | 5 refills | Status: DC
Start: 2016-01-09 — End: 2016-07-01

## 2016-01-09 NOTE — Progress Notes (Signed)
HISTORY OF PRESENT ILLNESS    Chief Complaint   Patient presents with   ??? Hypertension   ??? Annual Wellness Visit       Presents for follow-up.      Physician Addendum  I personally saw and examined the patient. I have discussed and reviewed note with Nurse Navigator and agree with the Nurse Navigator's findings and recommendations for this Wellness Exam.  I was present during the key portions of separately billed procedures.  Orders written and signed by me as per chart.    Cyndie Chime, MD    She is overall doing well.      Hypertension  Hypertension ROS: taking medications as instructed, no medication side effects noted, no TIA's, no chest pain on exertion, no dyspnea on exertion, no swelling of ankles     reports that she has never smoked. She has never used smokeless tobacco.    reports that she does not drink alcohol.   BP Readings from Last 2 Encounters:   01/09/16 138/74   06/21/15 130/82     Hyperlipidemia  ROS: taking medications as instructed, no medication side effects noted  No new myalgias, no joint pains, no weakness  No TIA's, no chest pain on exertion, no dyspnea on exertion, no swelling of ankles.   Lab Results   Component Value Date/Time    Cholesterol, total 259 06/21/2015 04:50 PM    HDL Cholesterol 72 06/21/2015 04:50 PM    LDL, calculated 158 06/21/2015 04:50 PM    VLDL, calculated 29 06/21/2015 04:50 PM    Triglyceride 144 06/21/2015 04:50 PM       Review of Systems   All other systems reviewed and are negative, except as noted in HPI    Past Medical and Surgical History   has a past medical history of Essential hypertension; GERD (gastroesophageal reflux disease); Hematuria (01/2010); Knee pain; Mixed hyperlipidemia; Spider varicose veins; and Vitamin D deficiency (02/2012).   has a past surgical history that includes colonoscopy (01/2010); knee replacement; and knee arthroscopy.     reports that she has never smoked. She has never used smokeless tobacco.  She reports that she does not drink alcohol or use illicit drugs.  family history includes Heart Disease in her brother and mother; Hypertension in her brother and mother; Kidney Disease in her mother; Lung Disease in her brother and father. There is no history of Diabetes or Cancer.    Physical Exam   Nursing note and vitals reviewed.  Blood pressure 138/74, pulse 81, temperature 98.1 ??F (36.7 ??C), temperature source Oral, resp. rate 16, height 5' 3.5" (1.613 m), weight 207 lb (93.9 kg).  Constitutional:  No distress.    Eyes: Conjunctivae are normal.   Ears:  Hearing grossly intact  Cardiovascular: Normal rate.  regular rhythm, no murmurs or gallops  No edema  Pulmonary/Chest: Effort normal.   CTAB  Musculoskeletal: moves all 4 extremities   Neurological: Alert and oriented to person, place, and time.   Skin: No rash noted.   Psychiatric: Normal mood and affect. Behavior is normal.     ASSESSMENT and PLAN  Valerie Barry was seen today for hypertension and annual wellness visit.    Diagnoses and all orders for this visit:    Essential hypertension- Controlled on current regimen.  Continue current medications as written in chart  -     telmisartan-hydroCHLOROthiazide (MICARDIS HCT) 80-25 mg per tablet; TAKE 1 TABLET BY MOUTH DAILY    Mixed hyperlipidemia- on no  medications. Repeat labs in 6 months    Asymptomatic menopausal state  -     DEXA BONE DENSITY STUDY AXIAL; Future    Vitamin D deficiency  -     DEXA BONE DENSITY STUDY AXIAL; Future    lab results and schedule of future lab studies reviewed with patient  reviewed medications and side effects in detail    Return to clinic for further evaluation if new symptoms develop    Follow-up Disposition: Not on File    Current Outpatient Prescriptions   Medication Sig   ??? naproxen sodium (ALEVE) 220 mg cap Take 1 Tab by mouth daily.   ??? telmisartan-hydroCHLOROthiazide (MICARDIS HCT) 80-25 mg per tablet TAKE 1 TABLET BY MOUTH DAILY    ??? Cholecalciferol, Vitamin D3, (VITAMIN D3) 2,000 unit cap capsule Take 2,000 Units by mouth daily.   ??? multivit-min-iron-FA-lutein (CENTRUM SILVER WOMEN) 8 mg iron-400 mcg-300 mcg tab Take 1 daily     No current facility-administered medications for this visit.

## 2016-01-09 NOTE — ACP (Advance Care Planning) (Signed)
Advance Care Planning (ACP) Provider Note - Comprehensive     Date of ACP Conversation: 01/09/16  Persons included in Conversation:  patient  Length of ACP Conversation in minutes:  <16 minutes (Non-Billable)    Authorized Media planner (if patient is incapable of making informed decisions):   This person is:  Radiation protection practitioner ACP for ALL Patients with Decision Making Capacity:   Importance of advance care planning, including choosing a healthcare agent to communicate patient's healthcare decisions if patient lost the ability to make decisions, such as after a sudden illness or accident  Understanding of the healthcare agent role was assessed and information provided  Exploration of values, goals, and preferences if recovery is not expected, even with continued medical treatment in the event of: Imminent death  Severe, permanent brain injury    Review of Existing Advance Directive:  not availble     For Serious or Chronic Illness:  Understanding of medical condition    Understanding of CPR, goals and expected outcomes, benefits and burdens discussed.  Information on CPR success rates provided (e.g. for CPR in hospital, survival to d/c at two weeks is 22%, for chronically ill or elderly/frail survival is less than 3%); Individual asked to communicate understanding of information in his/her own words.  Explored fears and concerns regarding CPR or possible outcomes    Interventions Provided:  Recommended completion of Advance Directive form after review of ACP materials and conversation with prospective healthcare agent   Recommended communicating the plan and making copies for the healthcare agent, personal physician, and others as appropriate (e.g., health system)

## 2016-01-09 NOTE — Patient Instructions (Signed)
Medicare Part B Preventive Services Limitations Recommendation Scheduled   Bone Mass Measurement  (age 77 & older, biennial) Requires diagnosis related to osteoporosis or estrogen deficiency. Biennial benefit unless patient has history of long-term glucocorticoid tx or baseline is needed because initial test was by other method Last:     A DEXA scan is recommended after you turn 77 years of age to determine your risk for osteoporosis Next:     Discuss with your doctor if this is appropriate for you   Colorectal Cancer Screening  -Fecal occult blood test (annual)  -Flexible sigmoidoscopy (5y)  -Screening colonoscopy (10y)  -Barium Enema  Last: 2011    Every 5-10 years depending on your colonoscopy result starting at age 72 years Next: NA   Glaucoma Screening (no USPSTF recommendation) Diabetes mellitus, family history, African American, age 102 or over, Hispanic American, age 70 or over Last: 2016    Every year Next: Goes yearly- will schedule this year   Screening Mammography (biennial age 2-74) Annually (age 55 or over) Last: NA Next: NA   Screening Pap Tests and Pelvic Examination (up to age 42 and after 65 if unknown history or abnormal study last 10 years) Every 24 months except high risk Last: NA Next:    Discuss with your doctor if this screening is appropriate for you.   Cardiovascular Screening Blood Tests (every 5 years)  Total cholesterol, HDL, Triglycerides Order as a panel if possible Last: 06/21/15    Every Year Next: As recommended by your physician   Diabetes Screening Tests (at least every 3 years, Medicare covers annually or at 45-month intervals for prediabetic patients)    Fasting blood sugar (FBS) or glucose tolerance test (GTT) Patient must be diagnosed with one of the following:  -Hypertension, Dyslipidemia, obesity, previous impaired FBS or GTT  ???Or any two of the following: overweight, FH of diabetes, age ?8, history of gestational diabetes, birth of baby weighing more than 9 pounds Last:  06/21/15    Every 3-6 months depending on your result    Every 3 years Next: As recommended by your physician   Diabetes Self-Management Training (DSMT) (no USPSTF recommendation) Requires referral by treating physician for patient with diabetes or renal disease. 10 hours of initial DSMT session of no less than 30 minutes each in a continuous 56-month period.  2 hours of follow-up DSMT in subsequent years. Last: NA Talk to your doctor if you are interested in a refresher course.   Medical Nutrition Therapy (MNT) (for diabetes or renal disease not recommended schedule) Requires referral by treating physician for patient with diabetes or renal disease.  Can be provided in same year as diabetes self-management training (DSMT), and CMS recommends medical nutrition therapy take place after DSMT.  Up to 3 hours for initial year and 2 hours in subsequent years. Last: NA Talk to your doctor if you are interested in a refresher course.   Seasonal Influenza Vaccination (annually)  Last: 05/28/15    Every Fall Please get one this Fall.   Pneumococcal Vaccination (once after 65)  Last:  Pneumovax:  10/24/06  Prevnar-13:  05/28/15 Complete   Shingles Vaccination  Last: 10/25/14    A shingles vaccine is recommended once in a lifetime after age 34 Complete   Tetanus, Diphtheria and Pertussis (Tdap) Vaccination Booster One Booster as an adult and then tetanus every 10 years or as indicated Last: 10/23/13 You will be due for a plain tetanus (Td) in 2025   Hepatitis B  Vaccinations (if medium/high risk) Medium/high risk factors:  End-stage renal disease,  Hemophiliacs who received Factor VIII or IX concentrates, Clients of institutions for the mentally retarded, Persons who live in the same house as a HepB virus carrier, Homosexual men, Illicit injectable drug abusers. Last: NA Next: NA   Counseling to Prevent Tobacco Use (up to 8 sessions per year)  - Counseling greater than 3 and up to 10 minutes   - Counseling greater than 10 minutes Patients must be asymptomatic of tobacco-related conditions to receive as preventive service Last: NA Next: NA   Human Immunodeficiency Virus (HIV) Screening (annually for increased risk patients)  HIV-1 and HIV-2 by EIA, ELISA, rapid antibody test, or oral mucosa transudate Patient must be at increased risk for HIV infection per USPSTF guidelines or pregnant.  Tests covered annually for patients at increased risk.  Pregnant patients may receive up to 3 test during pregnancy. Last: NA Next: NA   Ultrasound Screening for Abdominal Aortic Aneurysm (AAA) once Patient must be referred through IPPE and not have had a screening for abdominal aortic aneurysm before under medicare. Limited to patients who meet onf of the following criteria:  -Men who are 62-48 years old and have smoked more than 100 cigarettes in their lifetime  -Anyone with a FH of AAA  -Anyone recommended for screening by the USPSFTF As recommended by your PCP or Specialist   As recommended by your PCP or Specialist     NA = Not Applicable; NI= Not Indicated     1) Read over advanced care planning paperwork and consider bringing back to your next appointment

## 2016-01-09 NOTE — Progress Notes (Signed)
Dr. Maximino Greenland referred Valerie Barry, Nov 22, 1940, a 75 y.o. female for a Medicare Annual Wellness Visit (AWV)    This is an Initial Medicare Annual Wellness Exam (AWV) (Performed 12 months after IPPE or effective date of Medicare Part B enrollment, Once in a lifetime)    I have reviewed the patient's medical history in detail and updated the computerized patient record.     History     Past Medical History:   Diagnosis Date   ??? Essential hypertension    ??? GERD (gastroesophageal reflux disease)    ??? Hematuria 01/2010    Dr. York Cerise, due to catheter, blood clot. [place] nl 2012   ??? 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  Dr. Stacie Acres   ??? Mixed hyperlipidemia    ??? Spider varicose veins    ??? Vitamin D deficiency 02/2012      Past Surgical History:   Procedure Laterality Date   ??? COLONOSCOPY  01/2010    normal   ??? HX KNEE ARTHROSCOPY      x4   ??? HX KNEE REPLACEMENT      left knee replacement, Dr. Stacie Acres     Current Outpatient Prescriptions   Medication Sig Dispense Refill   ??? naproxen sodium (ALEVE) 220 mg cap Take 1 Tab by mouth daily.     ??? telmisartan-hydroCHLOROthiazide (MICARDIS HCT) 80-25 mg per tablet TAKE 1 TABLET BY MOUTH DAILY 30 Tab 0   ??? Cholecalciferol, Vitamin D3, (VITAMIN D3) 2,000 unit cap capsule Take 2,000 Units by mouth daily. 30 Cap 5   ??? multivit-min-iron-FA-lutein (CENTRUM SILVER WOMEN) 8 mg iron-400 mcg-300 mcg tab Take 1 daily 30 Tab 11     Allergies   Allergen Reactions   ??? Codeine Nausea Only   ??? Lisinopril Other (comments)     Facial flushing   ??? Tylenol-Codeine #2 Nausea and Vomiting     Family History   Problem Relation Age of Onset   ??? Hypertension Mother    ??? Kidney Disease Mother    ??? Heart Disease Mother      afib   ??? Hypertension Brother    ??? Heart Disease Brother    ??? Lung Disease Father      emphysema   ??? Lung Disease Brother    ??? Diabetes Neg Hx    ??? Cancer Neg Hx      Social History   Substance Use Topics   ??? Smoking status: Never Smoker   ??? Smokeless tobacco: Never Used   ??? Alcohol use No      Patient Active Problem List   Diagnosis Code   ??? Knee pain M25.569   ??? Mixed hyperlipidemia E78.2   ??? HTN (hypertension) I10   ??? Vitamin D deficiency E55.9   ??? Spider varicose veins I86.8         Depression Risk Factor Screening:     PHQ over the last two weeks 01/09/2016   PHQ Not Done -   Little interest or pleasure in doing things Not at all   Feeling down, depressed or hopeless Not at all   Total Score PHQ 2 0     Alcohol Risk Factor Screening:   On any occasion during the past 3 months, have you had more than 3 drinks containing alcohol?  No    Do you average more than 7 drinks per week?  No      Functional Ability and Level of Safety:  Hearing Loss   normal-to-mild    Activities of Daily Living   Self-care.   Requires assistance with: no ADLs    ADL Assessment 01/09/2016   Feeding yourself No Help Needed   Getting from bed to chair No Help Needed   Getting dressed No Help Needed   Bathing or showering No Help Needed   Walk across the room (includes cane/walker) No Help Needed   Using the telphone No Help Needed   Taking your medications No Help Needed   Preparing meals No Help Needed   Managing money (expenses/bills) No Help Needed   Moderately strenuous housework (laundry) No Help Needed   Shopping for personal items (toiletries/medicines) No Help Needed   Shopping for groceries No Help Needed   Driving No Help Needed   Climbing a flight of stairs No Help Needed   Getting to places beyond walking distances No Help Needed       Fall Risk   Fall Risk Assessment, last 12 mths 01/09/2016   Able to walk? Yes   Fall in past 12 months? No     Abuse Screen   Patient is not abused    Abuse Screening Questionnaire 01/09/2016   Do you ever feel afraid of your partner? N   Are you in a relationship with someone who physically or mentally threatens you? N   Is it safe for you to go home? Y       Review of Systems   Not required    Physical Examination     Visit Vitals    ??? BP 138/74 (BP 1 Location: Left arm, BP Patient Position: Sitting)   ??? Pulse 81   ??? Temp 98.1 ??F (36.7 ??C) (Oral)   ??? Resp 16   ??? Ht 5' 3.5" (1.613 m)   ??? Wt 207 lb (93.9 kg)   ??? BMI 36.09 kg/m2         Evaluation of Cognitive Function:  Mood/affect:  happy  Appearance: age appropriate and casually dressed  Family member/caregiver input: none present    No exam performed today, not required for AWV. Please see Dr. Jearld Pies note from same day for more physical exam information.    Patient Care Team:  Maximino Greenland, MD as PCP - General (Internal Medicine)  Eustaquio Boyden, MD (Urology)    Advice/Referrals/Counseling   Education and counseling provided:  End-of-Life planning (with patient's consent)  Pneumococcal Vaccine  Influenza Vaccine  Screening Mammography  Screening Pap and pelvic (covered once every 2 years)  Colorectal cancer screening tests  Cardiovascular screening blood test  Bone mass measurement (DEXA)  Screening for glaucoma  Diabetes screening test  Tdap and Shingles      Assessment/Plan       ICD-10-CM ICD-9-CM    1. Medicare annual wellness visit, initial Z00.00 V70.0    2. Screening for alcoholism Z13.89 V79.1    3. BMI 36.0-36.9,adult Z68.36 V85.36      4. Immunizations: Patient confirmed the following records of vaccinations are correct and current.  Immunization History   Administered Date(s) Administered   ??? Influenza Vaccine 05/28/2015   ??? Pneumococcal Conjugate (PCV-13) 05/28/2015   ??? Pneumococcal Vaccine (Unspecified Type) 10/24/2006   ??? Tdap 10/23/2013   ??? Zoster Vaccine, Live 10/25/2014   Patient is up to date on vaccines. Encouraged her to get a flu shot this fall.      5. Screenings: See chart in patient instructions for specific information. ADL's, Fall Risk, Depression  Screening and Abuse screening information is reported above. Patient has never had a dexa scan but is not interested at this time. She is up to date on other screenings per the recommendations for her age.     6. Medication Reconciliation was performed today and the following 1 change was made: added directions to naproxen.    7. Advanced Care Plan: Patient educated on the importance of an advanced care plan. Asked patient to read over the information that she already has at home and bring it back to her next appointment with her physician.         Patient verbalized understanding of information presented.  Answered all of the patient's questions.  AVS handed and reviewed with patient which includes a Medicare Wellness Preventative Screening Table and patient specific information.     Notification of recommendations will be sent to Dr. Maximino Greenland for review.    Gaynelle Cage, PharmD, BCPS, CDE

## 2016-01-29 ENCOUNTER — Encounter (INDEPENDENT_AMBULATORY_CARE_PROVIDER_SITE_OTHER): Payer: Self-pay

## 2016-01-29 ENCOUNTER — Ambulatory Visit (INDEPENDENT_AMBULATORY_CARE_PROVIDER_SITE_OTHER): Payer: Medicare Other | Admitting: Cardiology

## 2016-01-29 ENCOUNTER — Encounter: Payer: Self-pay | Admitting: Cardiology

## 2016-01-29 VITALS — BP 142/72 | HR 64 | Ht 60.0 in | Wt 137.6 lb

## 2016-01-29 DIAGNOSIS — I493 Ventricular premature depolarization: Secondary | ICD-10-CM

## 2016-01-29 DIAGNOSIS — I10 Essential (primary) hypertension: Secondary | ICD-10-CM

## 2016-01-29 DIAGNOSIS — R079 Chest pain, unspecified: Secondary | ICD-10-CM

## 2016-01-29 NOTE — Patient Instructions (Signed)
Medication Instructions:  Your physician recommends that you continue on your current medications as directed. Please refer to the Current Medication list given to you today.   Labwork: TODAY: BMET  Testing/Procedures: Dr. Turner recommends you have a CORONARY CT.  Follow-Up: Your physician wants you to follow-up in: 1 year with Dr. Turner. You will receive a reminder letter in the mail two months in advance. If you don't receive a letter, please call our office to schedule the follow-up appointment.   Any Other Special Instructions Will Be Listed Below (If Applicable).     If you need a refill on your cardiac medications before your next appointment, please call your pharmacy.   

## 2016-01-29 NOTE — Progress Notes (Signed)
Cardiology Office Note    Date:  01/29/2016   ID:  Cindy NgShirley L Bingley, DOB 21-Jun-1941, MRN [id number]  PCP:  Astrid DivineGRIFFIN,[name] COLLINS, MD  Cardiologist:  Armanda Magicraci , MD   Chief Complaint  Patient presents with  . Hypertension    History of Present Illness:  [name] is a 75 y.o. female with a history of PAD, HTN, dyslipidemia and atypical chest pain with normal nuclear stress test in 2009 and 2015, PACs and PVCs who now presents for followup. She is doing well with no LE edema, dizziness or syncope.  She has been having CP recently but she thinks it is indigestion.  She says that it is a pressure that is nonexertional.  She belches some with it but that does not relieve it.  She gets SOB with it but no diaphoresis or nausea. She has had occasional DOE.    Past Medical History  Diagnosis Date  . 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 Atypical chest pain     negative cardiolyte 09/2007 w Dr Mayford Knifeurner  . Posterior communicating artery aneurysm     followed by Dr Venetia MaxonStern  . Hypertension   . Hypercholesteremia   . PAC (premature atrial contraction) 08/14/2014  . PVC's (premature ventricular contractions) 08/14/2014    Past Surgical History  Procedure Laterality Date  . 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 Ascorbic Acid (VITAMIN C) 100 MG tablet Take 100 mg by mouth daily.    [name]. aspirin 325 MG tablet Take 325 mg by mouth daily.    [name]. atorvastatin (LIPITOR) 80 MG tablet Take 80 mg by mouth daily.    . calcium carbonate (OS-CAL) 600 MG TABS tablet Take 600 mg by mouth daily with breakfast.    . gabapentin (NEURONTIN) 300 MG capsule Take 300 mg by mouth 3 (three) times daily.    [name]. losartan-hydrochlorothiazide (HYZAAR) 100-25 MG per tablet Take 1 tablet by mouth daily.    . meloxicam (MOBIC) 7.5 MG  tablet Take 7.5 mg by mouth as needed for pain (once or twice a day as needed for shoulder/neck pain).    . Multiple Vitamin (MULTIVITAMIN) tablet Take 1 tablet by mouth daily.    . Omega-3 Fatty Acids (FISH OIL PO) Take 1 capsule by mouth daily.     [name]. omeprazole (PRILOSEC) 20 MG capsule Take 20 mg by mouth daily as needed.    [name]. QVAR 80 MCG/ACT inhaler Inhale 1 puff into the lungs as needed. For shortness of breath.  0  . SALINE NASAL SPRAY NA Place into the nose. 2 sprays per nostril every 3-4 hours for upper respatory maintenance    . vitamin B-12 (CYANOCOBALAMIN) 1000 MCG tablet Take 1,000 mcg by mouth daily.     No facility-administered medications prior to visit.     Allergies:   Codeine and Penicillins   Social History   Social History  . Marital Status: Single    Spouse Name: N/A  . Number of Children: N/A  . Years of Education: N/A   Social History Main Topics  . Smoking status: Former Smoker    Quit date: 07/13/2004  . Smokeless tobacco: None  . Alcohol Use: Yes     Comment: ocassionally   . Drug Use: No  . Sexual Activity:  Not Asked   Other Topics Concern  . None   Social History Narrative     Family History:  The patient's family history includes Heart attack in her brother, mother, and sister; Heart disease in her brother, mother, and sister.   ROS:   Please see the history of present illness.    ROS All other systems reviewed and are negative.   PHYSICAL EXAM:   VS:  BP 142/72 mmHg  Pulse 64  Ht 5' (1.524 m)  Wt 137 lb 9.6 oz (62.415 kg)  BMI 26.87 kg/m2   GEN: Well nourished, well developed, in no acute distress HEENT: normal Neck: no JVD, carotid bruits, or masses Cardiac: RRR; no murmurs, rubs, or gallops,no edema.  Intact distal pulses bilaterally.  Respiratory:  clear to auscultation bilaterally, normal work of breathing GI: soft, nontender, nondistended, + BS MS: no deformity or atrophy Skin: warm and dry, no rash Neuro:  Alert and Oriented  x 3, Strength and sensation are intact Psych: euthymic mood, full affect  Wt Readings from Last 3 Encounters:  01/29/16 137 lb 9.6 oz (62.415 kg)  08/14/14 142 lb (64.411 kg)  11/10/13 138 lb (62.596 kg)      Studies/Labs Reviewed:   EKG:  EKG is  ordered today.  The ekg ordered today demonstrates NSR at 65bpm with nonspecific ST abnormality  Recent Labs: No results found for requested labs within last 365 days.   Lipid Panel No results found for: CHOL, TRIG, HDL, CHOLHDL, VLDL, LDLCALC, LDLDIRECT  Additional studies/ records that were reviewed today include:      ASSESSMENT:    1. Chest pain, unspecified chest pain type   2. Essential hypertension, benign   3. PVC's (premature ventricular contractions)      PLAN:  In order of problems listed above:  1. Chest pain that is atypical but has persisted despite normal nuclear stress tests.  She does have a history of hyperlipidemia and PAD so is at increased risk for CAD.  I have recommended proceeding with coronary CTA with morphology and Ca score to evaluate for CAD.   2. HTN - Bp controlled on current medical therapy.  Cotinue Hyzaar. 3. PVC's - asymptomatic.    Medication Adjustments/Labs and Tests Ordered: Current medicines are reviewed at length with the patient today.  Concerns regarding medicines are outlined above.  Medication changes, Labs and Tests ordered today are listed in the Patient Instructions below.  There are no Patient Instructions on file for this visit.   Signed, Armanda Magic, MD  01/29/2016 1:35 PM    [facility] [address] [place], [place], [place]  [address] Phone: [phone]; Fax: [phone]

## 2016-01-30 LAB — BASIC METABOLIC PANEL
BUN: 12 mg/dL (ref 7–25)
CO2: 25 mmol/L (ref 20–31)
CREATININE: 0.79 mg/dL (ref 0.60–0.93)
Calcium: 9.7 mg/dL (ref 8.6–10.4)
Chloride: 104 mmol/L (ref 98–110)
Glucose, Bld: 87 mg/dL (ref 65–99)
Potassium: 4.1 mmol/L (ref 3.5–5.3)
Sodium: 140 mmol/L (ref 135–146)

## 2016-02-03 ENCOUNTER — Ambulatory Visit: Payer: MEDICARE | Primary: Internal Medicine

## 2016-02-03 ENCOUNTER — Encounter: Payer: Self-pay | Admitting: Cardiology

## 2016-02-04 ENCOUNTER — Ambulatory Visit: Payer: MEDICARE | Primary: Internal Medicine

## 2016-02-06 ENCOUNTER — Inpatient Hospital Stay: Admit: 2016-02-06 | Payer: MEDICARE | Attending: Internal Medicine | Primary: Internal Medicine

## 2016-02-06 ENCOUNTER — Ambulatory Visit: Payer: MEDICARE | Primary: Internal Medicine

## 2016-02-06 ENCOUNTER — Ambulatory Visit

## 2016-02-06 ENCOUNTER — Telehealth: Payer: Self-pay | Admitting: Cardiology

## 2016-02-06 DIAGNOSIS — Z13820 Encounter for screening for osteoporosis: Secondary | ICD-10-CM

## 2016-02-06 NOTE — Telephone Encounter (Signed)
New message     Pt calling about not receiving results of CT scan. Please call.

## 2016-02-06 NOTE — Telephone Encounter (Signed)
Pt calling because she has not gotten instructions for CTA scheduled for 02/13/16.  Pt advised letter had been mailed to her, I reviewed instructions over the telephone, pt given phone number for [facility], [phone], to call to review instructions with her. Pt advised to call our office back if she still has questions.

## 2016-02-11 ENCOUNTER — Telehealth: Payer: Self-pay | Admitting: Cardiology

## 2016-02-11 NOTE — Telephone Encounter (Signed)
New message  Pt call requesting to speak with RN about meds that she can take prior to CT scan. Please call back to discuss

## 2016-02-11 NOTE — Telephone Encounter (Signed)
Patient never received instruction letter for CT on Thursday. Reviewed instructions and directions to [employer] at [facility]. Patient was grateful for call.

## 2016-02-13 ENCOUNTER — Encounter (HOSPITAL_COMMUNITY): Payer: Self-pay | Admitting: Interventional Radiology

## 2016-02-13 ENCOUNTER — Ambulatory Visit (HOSPITAL_COMMUNITY)
Admission: RE | Admit: 2016-02-13 | Discharge: 2016-02-13 | Disposition: A | Discharge status: Home / Self Care | Payer: Medicare Other | Source: Ambulatory Visit | Attending: Cardiology | Admitting: Cardiology

## 2016-02-13 ENCOUNTER — Other Ambulatory Visit (HOSPITAL_COMMUNITY): Payer: Self-pay | Admitting: Cardiovascular Disease

## 2016-02-13 ENCOUNTER — Ambulatory Visit (HOSPITAL_COMMUNITY): Payer: Medicare Other

## 2016-02-13 ENCOUNTER — Ambulatory Visit (HOSPITAL_COMMUNITY)
Admission: RE | Admit: 2016-02-13 | Discharge: 2016-02-13 | Disposition: A | Discharge status: Home / Self Care | Payer: Medicare Other | Source: Ambulatory Visit | Attending: Cardiovascular Disease | Admitting: Cardiovascular Disease

## 2016-02-13 DIAGNOSIS — R079 Chest pain, unspecified: Secondary | ICD-10-CM

## 2016-02-13 DIAGNOSIS — I700 Atherosclerosis of aorta: Secondary | ICD-10-CM | POA: Diagnosis not present

## 2016-02-13 DIAGNOSIS — Z87898 Personal history of other specified conditions: Secondary | ICD-10-CM | POA: Diagnosis present

## 2016-02-13 DIAGNOSIS — Z789 Other specified health status: Secondary | ICD-10-CM

## 2016-02-13 HISTORY — PX: IR GENERIC HISTORICAL: IMG1180011

## 2016-02-13 MED ORDER — NITROGLYCERIN 0.4 MG SL SUBL
0.4000 mg | SUBLINGUAL_TABLET | SUBLINGUAL | Status: DC | PRN
  Administered 2016-02-13: 0.4 mg via SUBLINGUAL
  Filled 2016-02-13 (×2): qty 25

## 2016-02-13 MED ORDER — NITROGLYCERIN 0.4 MG SL SUBL
SUBLINGUAL_TABLET | SUBLINGUAL | Status: AC
  Filled 2016-02-13: qty 1

## 2016-02-13 MED ORDER — METOPROLOL TARTRATE 5 MG/5ML IV SOLN
5.0000 mg | Freq: Once | INTRAVENOUS | Status: AC
  Administered 2016-02-13: 5 mg via INTRAVENOUS
  Filled 2016-02-13: qty 5

## 2016-02-13 MED ORDER — IOPAMIDOL (ISOVUE-370) INJECTION 76%
INTRAVENOUS | Status: AC
  Administered 2016-02-13: 80 mL
  Filled 2016-02-13: qty 100

## 2016-02-13 MED ORDER — LIDOCAINE HCL 1 % IJ SOLN
INTRAMUSCULAR | Status: AC
  Administered 2016-02-13: 3 mL
  Filled 2016-02-13: qty 20

## 2016-02-13 MED ORDER — METOPROLOL TARTRATE 5 MG/5ML IV SOLN
INTRAVENOUS | Status: AC
  Administered 2016-02-13: 5 mg via INTRAVENOUS
  Filled 2016-02-13: qty 5

## 2016-02-17 ENCOUNTER — Telehealth: Payer: Self-pay

## 2016-02-17 MED ORDER — PANTOPRAZOLE SODIUM 40 MG PO TBEC: 40.0000 mg | DELAYED_RELEASE_TABLET | Freq: Every day | ORAL | 11 refills | Status: DC

## 2016-02-17 NOTE — Telephone Encounter (Signed)
-----   Message from Quintella Reichertraci R Turner, MD sent at 02/16/2016  6:12 PM EDT ----- Change omeprazole to Protonix 40mg  daily and followup with PA in 4 weeks to see if chest discomfort has improved.

## 2016-02-17 NOTE — Telephone Encounter (Signed)
Instructed patient to STOP OMEPRAZOLE and START PROTONIX 40 mg daily. FU scheduled with Ronie Spiesayna Dunn 9/6.  Patient was grateful for assistance.

## 2016-03-03 ENCOUNTER — Encounter: Payer: Self-pay | Admitting: Cardiology

## 2016-03-18 ENCOUNTER — Ambulatory Visit: Payer: Medicare Other | Admitting: Physician Assistant

## 2016-03-26 ENCOUNTER — Ambulatory Visit (INDEPENDENT_AMBULATORY_CARE_PROVIDER_SITE_OTHER): Payer: Medicare Other | Admitting: Physician Assistant

## 2016-03-26 ENCOUNTER — Encounter: Payer: Self-pay | Admitting: Physician Assistant

## 2016-03-26 ENCOUNTER — Telehealth: Payer: Self-pay | Admitting: *Deleted

## 2016-03-26 VITALS — BP 144/64 | HR 82 | Ht 62.0 in | Wt 133.8 lb

## 2016-03-26 DIAGNOSIS — I2510 Atherosclerotic heart disease of native coronary artery without angina pectoris: Secondary | ICD-10-CM | POA: Diagnosis not present

## 2016-03-26 DIAGNOSIS — I491 Atrial premature depolarization: Secondary | ICD-10-CM

## 2016-03-26 DIAGNOSIS — I493 Ventricular premature depolarization: Secondary | ICD-10-CM | POA: Diagnosis not present

## 2016-03-26 DIAGNOSIS — E785 Hyperlipidemia, unspecified: Secondary | ICD-10-CM

## 2016-03-26 DIAGNOSIS — I10 Essential (primary) hypertension: Secondary | ICD-10-CM

## 2016-03-26 DIAGNOSIS — R079 Chest pain, unspecified: Secondary | ICD-10-CM

## 2016-03-26 NOTE — Progress Notes (Signed)
Cardiology Office Note    Date:  03/26/2016  ID:  [name], DOB 12-16-1940, MRN [id number] PCP:  [name], MD  Cardiologist:  Dr. Mayford Knife  Chief Complaint: chest pain  History of Present Illness:  [name] is a 75 y.o. 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 Venetia Maxon), PACs, PVCs who presents for f/u of chest pain. 2D echo 06/2014: mod focal basal septal hypertrophy, turbulence across the LV outflow but no sig gradient measured, EF 60-65%, no RWMA, mild SAM, mild LAE. BMET 01/2016 wnl. No recent CBC this yr in notes. Nuc 2015 was normal. Event monitors 10/2013 and 08/2014 showed NSR, ST, PACs, PVCs.   She recently saw Dr. Mayford Knife for chest pain which she felt was related to indigestion. She had previously seen some relief with Tums. Cardiac CT 02/2016 showed poor quality study due to motion artifact and somewhat poor filling of coronary arteries with tortuosity, 2 isolated foci in LM (nonobstructive), calcium score 23rd percentile, normal left dominant coronary arteries with noted motion artifact and poor distal vessel visualization. Dr. Mayford Knife recommended she try Protonix instead of omeprazole.  She presents back for follow-up. She continues to have chest discomfort, although the frequency is less ever since switching to Protonix. She still notices it 1-2x per day, lasting 15 minutes at a time. It is described as indigestion sensation, sometimes associated with mild SOB. No diaphoresis, nausea, vomiting or palpitations. She also has begun to notice occasional radiation to her right arm and right neck. It seems to occur at random - can occur both at rest or with exertion, but not every time she exerts herself. When it does occur during exertion, it is relieved by sitting or lying down to rest. She says there have been times after walking out to her car  when she's wondering if she's about to have a heart attack because it takes a few minutes for her chest discomfort to settle down. No relationship with meals/position. No syncope or bleeding. No focal neuro sx. I spoke with Dr. Venetia Maxon regarding her brain aneurysm and she is actually in the middle of a workup to determine if there have been any recent changes.   Past Medical History:  Diagnosis Date  . Atypical chest pain    negative cardiolyte 09/2007 w Dr Mayford Knife  . CAD in native artery    a. Cor CT 02/2016 - poor quality due to motion/poor filling of coronary arteries with tortuosity, 2 isolated foci in LM (nonobstructive), calcium score 23rd percentile, normal left dominant coronary arteries with noted motion artifact and poor distal vessel visualization.  . Dyslipidemia   . Hypercholesteremia   . Hypertension   . PAC (premature atrial contraction) 08/14/2014  . 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 Posterior communicating artery aneurysm    followed by Dr Venetia Maxon  . PVC's (premature ventricular contractions) 08/14/2014    Past Surgical History:  Procedure Laterality Date  . 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 IR GENERIC HISTORICAL  02/13/2016   IR RADIOLOGY PERIPHERAL GUIDED IV START 02/13/2016 Berdine Dance, MD [facility]  . IR GENERIC HISTORICAL  02/13/2016   IR US GUIDE VASC ACCESS LEFT 02/13/2016 Berdine Dance, MD [facility]    Current Medications: Current Outpatient Prescriptions  Medication Sig Dispense  Refill  . Ascorbic Acid (VITAMIN C) 100 MG tablet Take 100 mg by mouth daily.    [name] aspirin 325 MG tablet Take 325 mg by mouth daily.    [name] atorvastatin (LIPITOR) 80 MG tablet Take 80 mg by mouth daily.    . calcium carbonate (OS-CAL) 600 MG TABS tablet Take 600 mg by mouth daily with breakfast.    . gabapentin (NEURONTIN) 300 MG capsule Take 300 mg by mouth 3 (three) times daily.    [name]  losartan-hydrochlorothiazide (HYZAAR) 100-25 MG per tablet Take 1 tablet by mouth daily.    . meloxicam (MOBIC) 7.5 MG tablet Take 7.5 mg by mouth as needed for pain (once or twice a day as needed for shoulder/neck pain).    . Multiple Vitamin (MULTIVITAMIN) tablet Take 1 tablet by mouth daily.    . Omega-3 Fatty Acids (FISH OIL PO) Take 1 capsule by mouth daily.     . pantoprazole (PROTONIX) 40 MG tablet Take 1 tablet (40 mg total) by mouth daily. 30 tablet 11  . QVAR 80 MCG/ACT inhaler Inhale 1 puff into the lungs as needed. For shortness of breath.  0  . SALINE NASAL SPRAY NA Place into the nose. 2 sprays per nostril every 3-4 hours for upper respatory maintenance    . vitamin B-12 (CYANOCOBALAMIN) 1000 MCG tablet Take 1,000 mcg by mouth daily.     No current facility-administered medications for this visit.      Allergies:   Codeine and Penicillins   Social History   Social History  . Marital status: Single    Spouse name: N/A  . Number of children: N/A  . Years of education: N/A   Social History Main Topics  . Smoking status: Former Smoker    Quit date: 07/13/2004  . Smokeless tobacco: Never Used  . Alcohol use Yes     Comment: ocassionally   . Drug use: No  . Sexual activity: Not Asked   Other Topics Concern  . None   Social History Narrative  . None     Family History:  The patient's family history includes Heart attack in her brother, mother, and sister; Heart disease in her brother, mother, and sister.   ROS:   Please see the history of present illness.  All other systems are reviewed and otherwise negative.    PHYSICAL EXAM:   VS:  BP (!) 144/64   Pulse 82   Ht 5\' 2"  (1.575 m)   Wt 133 lb 12.8 oz (60.7 kg)   BMI 24.47 kg/m   BMI: Body mass index is 24.47 kg/m. GEN: Well nourished, well developed AAF, in no acute distress  HEENT: normocephalic, atraumatic Neck: no JVD, carotid bruits, or masses Cardiac: RRR; soft holosystolic SEM, no rubs or gallops, no  edema  Respiratory:  clear to auscultation bilaterally, normal work of breathing GI: soft, nontender, nondistended, + BS MS: s/p R AKA Skin: warm and dry, no rash Neuro:  Alert and Oriented x 3, Strength and sensation are intact, follows commands Psych: euthymic mood, full affect  Wt Readings from Last 3 Encounters:  03/26/16 133 lb 12.8 oz (60.7 kg)  01/29/16 137 lb 9.6 oz (62.4 kg)  08/14/14 142 lb (64.4 kg)      Studies/Labs Reviewed:   EKG:  EKG was ordered today and personally reviewed by me and demonstrates NSR 82bpm, nonspecific T wave changes. No sig change from 2015.  Recent Labs: 01/29/2016: BUN 12; Creat 0.79; Potassium 4.1; Sodium 140  Lipid Panel None  Additional studies/ records that were reviewed today include: Summarized above.    ASSESSMENT & PLAN:   1. Chest pain - atypical and typical features, but occasionally has had exertional chest discomfort more concerning for angina. Coronary CT was poor quality limited by motion artifact. Cardiac risk factors include PAD, HTN, dyslipidemia, family history of CAD (3 uncles had heart disease, 2 of which passed away suddenly, and mother). Discussed with Dr. Mayford Knifeurner who agrees definitive eval is warranted. Risks and benefits of cardiac catheterization have been discussed with the patient.  These include bleeding, infection, kidney damage, stroke, heart attack, death.  The patient understands these risks and is willing to proceed. HOWEVER, she is presently in the middle of a workup for her brain aneurysm - I spoke with Dr. Venetia MaxonStern and his nurse Judie GrieveBryan. The patient is currently awaiting MRA to evaluate for any progression of her aneurysm. This is pending insurance authorization. Per d/w Dr. Venetia MaxonStern, if the aneurysm were to rupture on DAPT the patient would have low chance of survival. He feels that if cardiac evaluation is urgently warranted then we should go ahead but would need to accept the risk of the above -- otherwise it would be  prudent to make sure the aneurysm is stable before proceeding. The patient's EKG is nonacute and she is presently chest pain free. Discussed further with Dr. Mayford Knifeurner who agrees we should await results of aneurysm workup before proceeding with cath. We have called back to [facility] and requested an update of her brain MRA results as soon as they become available. Will tentatively schedule f/u in our office in 2 weeks. Warning symptoms reviewed. Continue ASA, statin. We considered addition of Imdur but do not want to cause any hemodynamic changes to the brain at present time while aneurysm is being worked up. Note echocardiogram previously showed turbulence across the LVOT so if cath is unremarkable we could consider updating her echocardiogram, although murmur is extremely soft. 2. CAD - indeterminately evaluated by coronary CT. As above. 3. Essential HTN - will not make any changes to regimen at present time - await LHC to help guide further regimen. 4. Dyslipidemia - will plan to check lipids/CMET with pre-cath labs as soon as a decision is made about timing. 5. PACs/PVCs - quiescent.  Disposition: F/u with me 2 weeks.  Medication Adjustments/Labs and Tests Ordered: Current medicines are reviewed at length with the patient today.  Concerns regarding medicines are outlined above. Medication changes, Labs and Tests ordered today are summarized above and listed in the Patient Instructions accessible in Encounters.   Thomasene MohairSigned, Dayna Dunn PA-C  03/26/2016 2:39 PM    [facility] [address], [place], [place]  [address] Phone: [phone](336) [phone]; Fax: [phone](336) [phone]

## 2016-03-26 NOTE — Telephone Encounter (Signed)
S/w Arlys JohnBrian @ [facility] @ [phone] will attach Bernette Mayersanya Dunn, PA, to report so she will know when test is done. Will route to Dayna to [place].

## 2016-03-26 NOTE — Patient Instructions (Addendum)
Medication Instructions:  Your physician recommends that you continue on your current medications as directed. Please refer to the Current Medication list given to you today.   Labwork:   Testing/Procedures:  YFollow-Up: Your physician recommends that you keep your scheduled  follow-up appointment with Ronie Spiesayna Dunn, PA.         If you need a refill on your cardiac medications before your next appointment, please call your pharmacy.

## 2016-03-30 ENCOUNTER — Encounter (HOSPITAL_COMMUNITY): Payer: Self-pay

## 2016-03-30 ENCOUNTER — Ambulatory Visit (HOSPITAL_COMMUNITY): Admit: 2016-03-30 | Payer: Medicare Other | Admitting: Cardiology

## 2016-03-30 ENCOUNTER — Other Ambulatory Visit: Payer: Self-pay | Admitting: Neurosurgery

## 2016-03-30 DIAGNOSIS — I671 Cerebral aneurysm, nonruptured: Secondary | ICD-10-CM

## 2016-03-30 SURGERY — LEFT HEART CATH AND CORONARY ANGIOGRAPHY
Anesthesia: LOCAL

## 2016-04-12 DIAGNOSIS — I214 Non-ST elevation (NSTEMI) myocardial infarction: Secondary | ICD-10-CM

## 2016-04-12 DIAGNOSIS — I5181 Takotsubo syndrome: Secondary | ICD-10-CM

## 2016-04-12 HISTORY — DX: Takotsubo syndrome: I51.81

## 2016-04-12 HISTORY — DX: Non-ST elevation (NSTEMI) myocardial infarction: I21.4

## 2016-04-13 ENCOUNTER — Encounter (INDEPENDENT_AMBULATORY_CARE_PROVIDER_SITE_OTHER): Payer: Self-pay

## 2016-04-13 ENCOUNTER — Ambulatory Visit (INDEPENDENT_AMBULATORY_CARE_PROVIDER_SITE_OTHER): Payer: Medicare Other | Admitting: Physician Assistant

## 2016-04-13 ENCOUNTER — Encounter: Payer: Self-pay | Admitting: Physician Assistant

## 2016-04-13 VITALS — BP 130/58 | HR 67 | Ht 62.0 in | Wt 135.8 lb

## 2016-04-13 DIAGNOSIS — R072 Precordial pain: Secondary | ICD-10-CM | POA: Diagnosis not present

## 2016-04-13 DIAGNOSIS — I671 Cerebral aneurysm, nonruptured: Secondary | ICD-10-CM | POA: Insufficient documentation

## 2016-04-13 DIAGNOSIS — I10 Essential (primary) hypertension: Secondary | ICD-10-CM | POA: Diagnosis not present

## 2016-04-13 DIAGNOSIS — I2510 Atherosclerotic heart disease of native coronary artery without angina pectoris: Secondary | ICD-10-CM

## 2016-04-13 DIAGNOSIS — E785 Hyperlipidemia, unspecified: Secondary | ICD-10-CM

## 2016-04-13 NOTE — Addendum Note (Signed)
Addended by: Julio SicksBOWERS, JENNIFER L on: 04/13/2016 03:02 PM   Modules accepted: Orders

## 2016-04-13 NOTE — Patient Instructions (Signed)
Medication Instructions:  None  Labwork: None  Testing/Procedures: Your physician has requested that you have an echocardiogram . Echocardiography is a painless test that uses sound waves to create images of your heart. It provides your doctor with information about the size and shape of your heart and how well your heart's chambers and valves are working. This procedure takes approximately one hour. There are no restrictions for this procedure.   Follow-Up: Your physician recommends that you schedule a follow-up appointment about a week after your MRI on 10/12.  Plan to see Ronie Spiesayna Dunn, PA-C again. (Schedule on a day Turner is in the office if possible.)   Any Other Special Instructions Will Be Listed Below (If Applicable).     If you need a refill on your cardiac medications before your next appointment, please call your pharmacy.

## 2016-04-13 NOTE — Progress Notes (Signed)
Cardiology Office Note    Date:  04/13/2016  ID:  [name], DOB 22-Sep-1940, MRN [id number] PCP:  [name], MD  Cardiologist:  Dr. Mayford Knife   Chief Complaint: f/u chest pain  History of Present Illness:  [name] is a 75 y.o. 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 Venetia Maxon), PACs, PVCs who presents for f/u of chest pain. 2D echo 06/2014: mod focal basal septal hypertrophy, turbulence across the LV outflow but no sig gradient measured, EF 60-65%, no RWMA, mild SAM, mild LAE. BMET 01/2016 wnl. No recent CBC this yr in notes. Nuc 2015 was normal. Event monitors 10/2013 and 08/2014 showed NSR, ST, PACs, PVCs.   She saw Dr. Mayford Knife for chest pain August 2017 which she felt was related to indigestion. She had seen some relief with Tums. Cardiac CT 02/2016 showed poor quality study due to motion artifact and somewhat poor filling of coronary arteries with tortuosity, 2 isolated foci in LM (nonobstructive), calcium score 23rd percentile, normal left dominant coronary arteries with noted motion artifact and poor distal vessel visualization. Dr. Mayford Knife recommended she try Protonix instead of omeprazole.  The patient presented back for f/u 03/26/16 at which time she was continuing to have chest pain 1-2x a day, occasional radiation to right arm and right neck, described as indigestion, sometimes associated with mild SOB. It was occurring at random, both at rest or with exertion, but not every time she exerts herself. When it would occur during exertion, it was relieved by sitting or lying down to rest. She said there had been times after walking out to her car when she's wondering if she's about to have a heart attack because it takes a few minutes for her chest discomfort to settle down. At her OV 03/26/16 I spoke with Dr. Mayford Knife and we had recommended to proceed  with heart catheterization for definitive evaluation. However, in speaking with her neurosurgeon she has been actively working up her brain aneurysm to determine any progression. It was felt she would be at high risk for mortality if she were to be placed on DAPT and experience a rupture. I reviewed with Dr. Mayford Knife and we elected to put the procedure on hold until we had more answers about the aneurysm. We did not add Imdur due to patient's history of headaches and not wanting to alter cerebral bloodflow.  She returns to clinic today. Per review of Epic and patient report, [facility] contacted the patient to schedule it twice but she did not have a chance to call them back yet. She not had any episodes of chest pain that were prolonged. Symptoms are the same as above, several times a week, lasting 2-3 minutes at a time. Sometimes it feels like a grabbing sensation and she can rub her chest and it gets better. She's tried to take her time more with activity to prevent any more exertional-type symptoms.   Past Medical History:  Diagnosis Date  . Atypical chest pain    negative cardiolyte 09/2007 w Dr Mayford Knife  . CAD in native artery    a. Cor CT 02/2016 - poor quality due to motion/poor filling of coronary arteries with tortuosity, 2 isolated foci in LM (nonobstructive), calcium score 23rd percentile, normal left dominant coronary arteries with noted motion artifact and poor distal vessel visualization.  . Dyslipidemia   . Hypercholesteremia   . Hypertension   .  PAC (premature atrial contraction) 08/14/2014  . 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 Posterior communicating artery aneurysm    followed by Dr Venetia MaxonStern  . PVC's (premature ventricular contractions) 08/14/2014    Past Surgical History:  Procedure Laterality Date  . 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 IR GENERIC HISTORICAL  02/13/2016   IR RADIOLOGY  PERIPHERAL GUIDED IV START 02/13/2016 Berdine DanceMichael Shick, MD [facility]  . IR GENERIC HISTORICAL  02/13/2016   IR US GUIDE VASC ACCESS LEFT 02/13/2016 Berdine DanceMichael Shick, MD [facility]    Current Medications: Current Outpatient Prescriptions  Medication Sig Dispense Refill  . Ascorbic Acid (VITAMIN C) 100 MG tablet Take 100 mg by mouth daily.    [name]. aspirin 325 MG tablet Take 325 mg by mouth daily.    [name]. atorvastatin (LIPITOR) 80 MG tablet Take 80 mg by mouth daily.    . calcium carbonate (OS-CAL) 600 MG TABS tablet Take 600 mg by mouth daily with breakfast.    . gabapentin (NEURONTIN) 300 MG capsule Take 300 mg by mouth 3 (three) times daily.    [name]. losartan-hydrochlorothiazide (HYZAAR) 100-25 MG per tablet Take 1 tablet by mouth daily.    . meloxicam (MOBIC) 7.5 MG tablet Take 7.5 mg by mouth as needed for pain (once or twice a day as needed for shoulder/neck pain).    . Multiple Vitamin (MULTIVITAMIN) tablet Take 1 tablet by mouth daily.    . Omega-3 Fatty Acids (FISH OIL PO) Take 1 capsule by mouth daily.     . pantoprazole (PROTONIX) 40 MG tablet Take 1 tablet (40 mg total) by mouth daily. 30 tablet 11  . QVAR 80 MCG/ACT inhaler Inhale 1 puff into the lungs as needed. For shortness of breath.  0  . SALINE NASAL SPRAY NA Place into the nose. 2 sprays per nostril every 3-4 hours for upper respatory maintenance    . vitamin B-12 (CYANOCOBALAMIN) 1000 MCG tablet Take 1,000 mcg by mouth daily.     No current facility-administered medications for this visit.      Allergies:   Codeine and Penicillins   Social History   Social History  . Marital status: Single    Spouse name: N/A  . Number of children: N/A  . Years of education: N/A   Social History Main Topics  . Smoking status: Former Smoker    Quit date: 07/13/2004  . Smokeless tobacco: Never Used  . Alcohol use Yes     Comment: ocassionally   . Drug use: No  . Sexual activity: Not Asked   Other Topics Concern  . None   Social History  Narrative  . None     Family History:  The patient's family history includes Heart attack in her brother, mother, and sister; Heart disease in her brother, mother, and sister.   ROS:   Please see the history of present illness.  All other systems are reviewed and otherwise negative.    PHYSICAL EXAM:   VS:  BP (!) 130/58   Pulse 67   Ht 5\' 2"  (1.575 m)   Wt 135 lb 12.8 oz (61.6 kg)   BMI 24.84 kg/m   BMI: Body mass index is 24.84 kg/m. GEN: Well nourished, well developed AAF, in no acute distress  HEENT: normocephalic, atraumatic Neck: no JVD, carotid bruits, or masses Cardiac: RRR; soft holosystolic SEM, no rubs or  gallops, no edema  Respiratory:  clear to auscultation bilaterally, normal work of breathing GI: soft, nontender, nondistended, + BS MS: s/p R AKA Skin: warm and dry, no rash Neuro:  Alert and Oriented x 3, Strength and sensation are intact, follows commands Psych: euthymic mood, full affect  Wt Readings from Last 3 Encounters:  04/13/16 135 lb 12.8 oz (61.6 kg)  03/26/16 133 lb 12.8 oz (60.7 kg)  01/29/16 137 lb 9.6 oz (62.4 kg)      Studies/Labs Reviewed:   EKG:  EKG was ordered today and personally reviewed by me and demonstrates NSR 67bpm, nonspecific ST-T changes  Recent Labs: 01/29/2016: BUN 12; Creat 0.79; Potassium 4.1; Sodium 140   Lipid Panel No results found for: CHOL, TRIG, HDL, CHOLHDL, VLDL, LDLCALC, LDLDIRECT  Additional studies/ records that were reviewed today include: Summarized above.    ASSESSMENT & PLAN:   1. Chest pain/CAD - indeterminately evaluated by coronary CT; continues to have intermittent chest pain with mixed atypical/typical features. We are still awaiting workup of her brain MRI/MRA (see A/P from 9/14) to determine if further evaluation is needed for her brain aneurysm before committing her to cardiac catheterization. The patient went ahead and scheduled her brain imaging while in clinic today. This is scheduled for  04/23/16. As above, did not make any additions to anginal regimen as we do not want to exacerbate her chronic headaches or alter cerebral bloodflow. Note echocardiogram previously showed turbulence across the LVOT - in the interim will obtain 2D echo. Warning sx reviewed with patient. 2. Brain aneurysm - see above. 3. Essential HTN - generally controlled. Will not make any changes to regimen at present time - await LHC/brain aneurysm workup to help guide further regimen. 4. Dysplipidemia - continue statin. Will plan to check lipids/CMET with pre-cath labs as soon as a decision is made about timing.  Disposition: F/u with me on a day when Dr. Mayford Knifeurner is in the office after brain imaging.    Medication Adjustments/Labs and Tests Ordered: Current medicines are reviewed at length with the patient today.  Concerns regarding medicines are outlined above. Medication changes, Labs and Tests ordered today are summarized above and listed in the Patient Instructions accessible in Encounters.   Thomasene MohairSigned, Dayna Dunn PA-C  04/13/2016 2:32 PM    [facility] [address], [place], [place]  [address] Phone: [phone](336) [phone]; Fax: [phone](336) [phone]

## 2016-04-21 ENCOUNTER — Emergency Department (HOSPITAL_COMMUNITY): Payer: Medicare Other

## 2016-04-21 ENCOUNTER — Inpatient Hospital Stay (HOSPITAL_COMMUNITY)
Admission: EM | Admit: 2016-04-21 | Discharge: 2016-04-23 | DRG: 282 | Disposition: A | Discharge status: Home Health | Payer: Medicare Other | Attending: Cardiology | Admitting: Cardiology

## 2016-04-21 ENCOUNTER — Encounter (HOSPITAL_COMMUNITY): Payer: Self-pay | Admitting: Emergency Medicine

## 2016-04-21 ENCOUNTER — Encounter (HOSPITAL_COMMUNITY)
Admission: EM | Disposition: A | Discharge status: Home Health | Payer: Self-pay | Source: Home / Self Care | Attending: Cardiology

## 2016-04-21 DIAGNOSIS — E785 Hyperlipidemia, unspecified: Secondary | ICD-10-CM | POA: Diagnosis present

## 2016-04-21 DIAGNOSIS — Z7982 Long term (current) use of aspirin: Secondary | ICD-10-CM

## 2016-04-21 DIAGNOSIS — E876 Hypokalemia: Secondary | ICD-10-CM | POA: Diagnosis present

## 2016-04-21 DIAGNOSIS — Z791 Long term (current) use of non-steroidal anti-inflammatories (NSAID): Secondary | ICD-10-CM

## 2016-04-21 DIAGNOSIS — I5181 Takotsubo syndrome: Secondary | ICD-10-CM | POA: Diagnosis not present

## 2016-04-21 DIAGNOSIS — Z23 Encounter for immunization: Secondary | ICD-10-CM | POA: Diagnosis not present

## 2016-04-21 DIAGNOSIS — Z880 Allergy status to penicillin: Secondary | ICD-10-CM | POA: Diagnosis not present

## 2016-04-21 DIAGNOSIS — Z7951 Long term (current) use of inhaled steroids: Secondary | ICD-10-CM

## 2016-04-21 DIAGNOSIS — I493 Ventricular premature depolarization: Secondary | ICD-10-CM | POA: Diagnosis present

## 2016-04-21 DIAGNOSIS — I491 Atrial premature depolarization: Secondary | ICD-10-CM | POA: Diagnosis present

## 2016-04-21 DIAGNOSIS — I25119 Atherosclerotic heart disease of native coronary artery with unspecified angina pectoris: Secondary | ICD-10-CM | POA: Diagnosis present

## 2016-04-21 DIAGNOSIS — I249 Acute ischemic heart disease, unspecified: Secondary | ICD-10-CM | POA: Diagnosis not present

## 2016-04-21 DIAGNOSIS — Z89611 Acquired absence of right leg above knee: Secondary | ICD-10-CM

## 2016-04-21 DIAGNOSIS — Z87891 Personal history of nicotine dependence: Secondary | ICD-10-CM

## 2016-04-21 DIAGNOSIS — I2510 Atherosclerotic heart disease of native coronary artery without angina pectoris: Secondary | ICD-10-CM | POA: Diagnosis present

## 2016-04-21 DIAGNOSIS — I214 Non-ST elevation (NSTEMI) myocardial infarction: Secondary | ICD-10-CM | POA: Diagnosis present

## 2016-04-21 DIAGNOSIS — Z885 Allergy status to narcotic agent status: Secondary | ICD-10-CM | POA: Diagnosis not present

## 2016-04-21 DIAGNOSIS — I10 Essential (primary) hypertension: Secondary | ICD-10-CM | POA: Diagnosis present

## 2016-04-21 DIAGNOSIS — Z79899 Other long term (current) drug therapy: Secondary | ICD-10-CM

## 2016-04-21 DIAGNOSIS — R079 Chest pain, unspecified: Secondary | ICD-10-CM | POA: Diagnosis not present

## 2016-04-21 DIAGNOSIS — I209 Angina pectoris, unspecified: Secondary | ICD-10-CM | POA: Diagnosis present

## 2016-04-21 DIAGNOSIS — Z8249 Family history of ischemic heart disease and other diseases of the circulatory system: Secondary | ICD-10-CM

## 2016-04-21 DIAGNOSIS — I739 Peripheral vascular disease, unspecified: Secondary | ICD-10-CM | POA: Diagnosis present

## 2016-04-21 DIAGNOSIS — I671 Cerebral aneurysm, nonruptured: Secondary | ICD-10-CM | POA: Diagnosis present

## 2016-04-21 HISTORY — DX: Non-ST elevation (NSTEMI) myocardial infarction: I21.4

## 2016-04-21 HISTORY — PX: CARDIAC CATHETERIZATION: SHX172

## 2016-04-21 HISTORY — DX: Gastro-esophageal reflux disease without esophagitis: K21.9

## 2016-04-21 LAB — CBC
HCT: 40.6 % (ref 36.0–46.0)
HEMOGLOBIN: 13 g/dL (ref 12.0–15.0)
MCH: 29.5 pg (ref 26.0–34.0)
MCHC: 32 g/dL (ref 30.0–36.0)
MCV: 92.1 fL (ref 78.0–100.0)
Platelets: 185 10*3/uL (ref 150–400)
RBC: 4.41 MIL/uL (ref 3.87–5.11)
RDW: 14.4 % (ref 11.5–15.5)
WBC: 7.3 10*3/uL (ref 4.0–10.5)

## 2016-04-21 LAB — PROTIME-INR
INR: 0.96
Prothrombin Time: 12.8 seconds (ref 11.4–15.2)

## 2016-04-21 LAB — BASIC METABOLIC PANEL
ANION GAP: 9 (ref 5–15)
BUN: 17 mg/dL (ref 6–20)
CALCIUM: 10.2 mg/dL (ref 8.9–10.3)
CHLORIDE: 102 mmol/L (ref 101–111)
CO2: 25 mmol/L (ref 22–32)
Creatinine, Ser: 0.85 mg/dL (ref 0.44–1.00)
GFR calc non Af Amer: >60 mL/min (ref >60)
Glucose, Bld: 113 mg/dL — ABNORMAL HIGH (ref 65–99)
Potassium: 3.2 mmol/L — ABNORMAL LOW (ref 3.5–5.1)
Sodium: 136 mmol/L (ref 135–145)

## 2016-04-21 LAB — MAGNESIUM: MAGNESIUM: 2 mg/dL (ref 1.7–2.4)

## 2016-04-21 LAB — TROPONIN I: TROPONIN I: 0.47 ng/mL — AB (ref <0.03)

## 2016-04-21 LAB — HEPATIC FUNCTION PANEL
ALT: 15 U/L (ref 14–54)
AST: 28 U/L (ref 15–41)
Albumin: 3.8 g/dL (ref 3.5–5.0)
Alkaline Phosphatase: 42 U/L (ref 38–126)
Bilirubin, Direct: 0.1 mg/dL (ref 0.1–0.5)
Indirect Bilirubin: 0.7 mg/dL (ref 0.3–0.9)
TOTAL PROTEIN: 6.4 g/dL — AB (ref 6.5–8.1)
Total Bilirubin: 0.8 mg/dL (ref 0.3–1.2)

## 2016-04-21 LAB — I-STAT TROPONIN, ED: TROPONIN I, POC: 0.29 ng/mL — AB (ref 0.00–0.08)

## 2016-04-21 SURGERY — LEFT HEART CATH AND CORONARY ANGIOGRAPHY
Anesthesia: LOCAL

## 2016-04-21 MED ORDER — FENTANYL CITRATE (PF) 100 MCG/2ML IJ SOLN
INTRAMUSCULAR | Status: AC
  Filled 2016-04-21: qty 2

## 2016-04-21 MED ORDER — NITROGLYCERIN 1 MG/10 ML FOR IR/CATH LAB
INTRA_ARTERIAL | Status: DC | PRN
Start: 2016-04-21 — End: 2016-04-21
  Administered 2016-04-21: 200 ug via INTRA_ARTERIAL

## 2016-04-21 MED ORDER — OMEGA-3-ACID ETHYL ESTERS 1 G PO CAPS
1.0000 g | ORAL_CAPSULE | Freq: Every day | ORAL | Status: DC
  Filled 2016-04-21: qty 1

## 2016-04-21 MED ORDER — VITAMIN C 100 MG PO TABS: 100.0000 mg | ORAL_TABLET | Freq: Every day | ORAL | Status: DC

## 2016-04-21 MED ORDER — HEPARIN (PORCINE) IN NACL 2-0.9 UNIT/ML-% IJ SOLN
INTRAMUSCULAR | Status: AC
  Filled 2016-04-21: qty 1000

## 2016-04-21 MED ORDER — SODIUM CHLORIDE 0.9 % IV SOLN
INTRAVENOUS | Status: DC | PRN
  Administered 2016-04-21: 61 mL/h via INTRAVENOUS

## 2016-04-21 MED ORDER — ATORVASTATIN CALCIUM 80 MG PO TABS: 80.0000 mg | ORAL_TABLET | Freq: Every day | ORAL | Status: DC

## 2016-04-21 MED ORDER — ASPIRIN EC 81 MG PO TBEC
81.0000 mg | DELAYED_RELEASE_TABLET | Freq: Every day | ORAL | Status: DC
  Administered 2016-04-22 – 2016-04-23 (×2): 81 mg via ORAL
  Filled 2016-04-21 (×3): qty 1

## 2016-04-21 MED ORDER — LOSARTAN POTASSIUM 50 MG PO TABS
100.0000 mg | ORAL_TABLET | Freq: Every day | ORAL | Status: DC
  Administered 2016-04-22: 10:00:00 100 mg via ORAL
  Filled 2016-04-21: qty 2

## 2016-04-21 MED ORDER — LOSARTAN POTASSIUM-HCTZ 100-25 MG PO TABS: 1.0000 | ORAL_TABLET | Freq: Every day | ORAL | Status: DC

## 2016-04-21 MED ORDER — LIDOCAINE HCL (PF) 1 % IJ SOLN
INTRAMUSCULAR | Status: DC | PRN
  Administered 2016-04-21: 3 mL

## 2016-04-21 MED ORDER — VERAPAMIL HCL 2.5 MG/ML IV SOLN
INTRAVENOUS | Status: AC
  Filled 2016-04-21: qty 2

## 2016-04-21 MED ORDER — LIDOCAINE HCL (PF) 1 % IJ SOLN
INTRAMUSCULAR | Status: AC
  Filled 2016-04-21: qty 30

## 2016-04-21 MED ORDER — SODIUM CHLORIDE 0.9 % WEIGHT BASED INFUSION: 1.0000 mL/kg/h | INTRAVENOUS | Status: AC

## 2016-04-21 MED ORDER — MIDAZOLAM HCL 2 MG/2ML IJ SOLN
INTRAMUSCULAR | Status: DC | PRN
Start: 2016-04-21 — End: 2016-04-21
  Administered 2016-04-21 (×2): 1 mg via INTRAVENOUS

## 2016-04-21 MED ORDER — GABAPENTIN 300 MG PO CAPS
300.0000 mg | ORAL_CAPSULE | Freq: Three times a day (TID) | ORAL | Status: DC
  Administered 2016-04-21 – 2016-04-23 (×5): 300 mg via ORAL
  Filled 2016-04-21 (×5): qty 1

## 2016-04-21 MED ORDER — SODIUM CHLORIDE 0.9 % IV SOLN: 250.0000 mL | INTRAVENOUS | Status: DC | PRN

## 2016-04-21 MED ORDER — IOPAMIDOL (ISOVUE-370) INJECTION 76%
INTRAVENOUS | Status: AC
Start: 2016-04-21 — End: 2016-04-21
  Filled 2016-04-21: qty 100

## 2016-04-21 MED ORDER — MIDAZOLAM HCL 2 MG/2ML IJ SOLN
INTRAMUSCULAR | Status: AC
  Filled 2016-04-21: qty 2

## 2016-04-21 MED ORDER — ASPIRIN 81 MG PO CHEW
324.0000 mg | CHEWABLE_TABLET | Freq: Once | ORAL | Status: AC
  Administered 2016-04-21: 324 mg via ORAL
  Filled 2016-04-21: qty 4

## 2016-04-21 MED ORDER — IOPAMIDOL (ISOVUE-370) INJECTION 76%
INTRAVENOUS | Status: DC | PRN
  Administered 2016-04-21: 75 mL via INTRA_ARTERIAL

## 2016-04-21 MED ORDER — HEPARIN SODIUM (PORCINE) 1000 UNIT/ML IJ SOLN
INTRAMUSCULAR | Status: AC
  Filled 2016-04-21: qty 1

## 2016-04-21 MED ORDER — POTASSIUM CHLORIDE CRYS ER 20 MEQ PO TBCR
40.0000 meq | EXTENDED_RELEASE_TABLET | Freq: Once | ORAL | Status: AC
  Administered 2016-04-21: 40 meq via ORAL
  Filled 2016-04-21: qty 2

## 2016-04-21 MED ORDER — LOSARTAN POTASSIUM 50 MG PO TABS: 100.0000 mg | ORAL_TABLET | Freq: Every day | ORAL | Status: DC

## 2016-04-21 MED ORDER — HEPARIN (PORCINE) IN NACL 2-0.9 UNIT/ML-% IJ SOLN
INTRAMUSCULAR | Status: DC | PRN
  Administered 2016-04-21: 1000 mL

## 2016-04-21 MED ORDER — VERAPAMIL HCL 2.5 MG/ML IV SOLN
INTRAVENOUS | Status: DC | PRN
  Administered 2016-04-21: 10 mL via INTRA_ARTERIAL

## 2016-04-21 MED ORDER — HYDROCHLOROTHIAZIDE 25 MG PO TABS: 25.0000 mg | ORAL_TABLET | Freq: Every day | ORAL | Status: DC

## 2016-04-21 MED ORDER — NITROGLYCERIN 1 MG/10 ML FOR IR/CATH LAB
INTRA_ARTERIAL | Status: AC
  Filled 2016-04-21: qty 10

## 2016-04-21 MED ORDER — HEPARIN BOLUS VIA INFUSION
3500.0000 [IU] | Freq: Once | INTRAVENOUS | Status: DC
  Filled 2016-04-21: qty 3500

## 2016-04-21 MED ORDER — NITROGLYCERIN 0.4 MG SL SUBL: 0.4000 mg | SUBLINGUAL_TABLET | SUBLINGUAL | Status: DC | PRN

## 2016-04-21 MED ORDER — PANTOPRAZOLE SODIUM 40 MG PO TBEC
40.0000 mg | DELAYED_RELEASE_TABLET | Freq: Every day | ORAL | Status: DC
Start: 2016-04-22 — End: 2016-04-23
  Filled 2016-04-21 (×2): qty 1

## 2016-04-21 MED ORDER — CALCIUM CARBONATE 600 MG PO TABS: 600.0000 mg | ORAL_TABLET | Freq: Every day | ORAL | Status: DC

## 2016-04-21 MED ORDER — POTASSIUM CHLORIDE CRYS ER 20 MEQ PO TBCR: 40.0000 meq | EXTENDED_RELEASE_TABLET | Freq: Once | ORAL | Status: DC

## 2016-04-21 MED ORDER — BECLOMETHASONE DIPROPIONATE 80 MCG/ACT IN AERS
1.0000 | INHALATION_SPRAY | RESPIRATORY_TRACT | Status: DC | PRN
Start: 2016-04-21 — End: 2016-04-21

## 2016-04-21 MED ORDER — ASPIRIN 81 MG PO CHEW: 324.0000 mg | CHEWABLE_TABLET | Freq: Once | ORAL | Status: DC

## 2016-04-21 MED ORDER — SALINE NASAL SPRAY 0.65 % NA SOLN: 2.0000 | NASAL | Status: DC | PRN

## 2016-04-21 MED ORDER — ACETAMINOPHEN 325 MG PO TABS: 650.0000 mg | ORAL_TABLET | ORAL | Status: DC | PRN

## 2016-04-21 MED ORDER — ONDANSETRON HCL 4 MG/2ML IJ SOLN: 4.0000 mg | Freq: Four times a day (QID) | INTRAMUSCULAR | Status: DC | PRN

## 2016-04-21 MED ORDER — ACETAMINOPHEN 325 MG PO TABS
650.0000 mg | ORAL_TABLET | ORAL | Status: DC | PRN
  Administered 2016-04-22: 03:00:00 650 mg via ORAL
  Filled 2016-04-21: qty 2

## 2016-04-21 MED ORDER — HEPARIN (PORCINE) IN NACL 100-0.45 UNIT/ML-% IJ SOLN
700.0000 [IU]/h | INTRAMUSCULAR | Status: DC
  Filled 2016-04-21: qty 250

## 2016-04-21 MED ORDER — FENTANYL CITRATE (PF) 100 MCG/2ML IJ SOLN
INTRAMUSCULAR | Status: DC | PRN
  Administered 2016-04-21 (×2): 25 ug via INTRAVENOUS

## 2016-04-21 MED ORDER — VITAMIN B-12 1000 MCG PO TABS
1000.0000 ug | ORAL_TABLET | Freq: Every day | ORAL | Status: DC
  Filled 2016-04-21: qty 1

## 2016-04-21 MED ORDER — SODIUM CHLORIDE 0.9% FLUSH
3.0000 mL | Freq: Two times a day (BID) | INTRAVENOUS | Status: DC
  Administered 2016-04-21 – 2016-04-22 (×2): 3 mL via INTRAVENOUS

## 2016-04-21 MED ORDER — SODIUM CHLORIDE 0.9% FLUSH: 3.0000 mL | INTRAVENOUS | Status: DC | PRN

## 2016-04-21 MED ORDER — GADOBENATE DIMEGLUMINE 529 MG/ML IV SOLN
13.0000 mL | Freq: Once | INTRAVENOUS | Status: AC | PRN
  Administered 2016-04-21: 13 mL via INTRAVENOUS

## 2016-04-21 MED ORDER — HEPARIN SODIUM (PORCINE) 1000 UNIT/ML IJ SOLN
INTRAMUSCULAR | Status: DC | PRN
  Administered 2016-04-21: 4000 [IU] via INTRAVENOUS

## 2016-04-21 MED ORDER — SODIUM CHLORIDE 0.9 % IV SOLN: INTRAVENOUS | Status: DC

## 2016-04-21 MED ORDER — ADULT MULTIVITAMIN W/MINERALS CH
1.0000 | ORAL_TABLET | Freq: Every day | ORAL | Status: DC
  Filled 2016-04-21: qty 1

## 2016-04-21 SURGICAL SUPPLY — 11 items
CATH 5FR JL3.5 JR4 ANG PIG MP (CATHETERS) ×2 IMPLANT
CATH LAUNCHER 5F EBU3.0 (CATHETERS) ×1 IMPLANT
CATHETER LAUNCHER 5F EBU3.0 (CATHETERS) ×2
DEVICE RAD COMP TR BAND LRG (VASCULAR PRODUCTS) ×2 IMPLANT
GLIDESHEATH SLEND SS 6F .021 (SHEATH) ×2 IMPLANT
KIT HEART LEFT (KITS) ×2 IMPLANT
PACK CARDIAC CATHETERIZATION (CUSTOM PROCEDURE TRAY) ×2 IMPLANT
TRANSDUCER W/STOPCOCK (MISCELLANEOUS) ×2 IMPLANT
TUBING CIL FLEX 10 FLL-RA (TUBING) ×2 IMPLANT
WIRE EMERALD 3MM-J .035X260CM (WIRE) ×2 IMPLANT
WIRE HI TORQ VERSACORE-J 145CM (WIRE) ×2 IMPLANT

## 2016-04-21 NOTE — ED Notes (Signed)
Attempted to start IV; unsuccessful

## 2016-04-21 NOTE — Interval H&P Note (Signed)
Cath Lab Visit (complete for each Cath Lab visit)  Clinical Evaluation Leading to the Procedure:   ACS: Yes.    Non-ACS:    Anginal Classification: CCS IV  Anti-ischemic medical therapy: Minimal Therapy (1 class of medications)  Non-Invasive Test Results: No non-invasive testing performed  Prior CABG: No previous CABG  OK to use DAPT per neurosurgery based on MRI today.     History and Physical Interval Note:  04/21/2016 5:43 PM  [name]  has presented today for surgery, with the diagnosis of Non-STEMI  The various methods of treatment have been discussed with the patient and family. After consideration of risks, benefits and other options for treatment, the patient has consented to  Procedure(s): Left Heart Cath and Coronary Angiography (N/A) as a surgical intervention .  The patient's history has been reviewed, patient examined, no change in status, stable for surgery.  I have reviewed the patient's chart and labs.  Questions were answered to the patient's satisfaction.     Lance MussJayadeep Varanasi

## 2016-04-21 NOTE — ED Provider Notes (Signed)
[facility]PT Provider Note   CSN: [id number] Arrival date & time: 04/21/16  1048     History   Chief Complaint Chief Complaint  Patient presents with  . Chest Pain    HPI  Blood pressure 152/63, pulse 89, temperature 97.6 F (36.4 C), temperature source Oral, resp. rate 16, height 5\' 2"  (1.575 m), weight 61.2 kg, SpO2 99 %.  [name] is a 75 y.o. female with past medical history significant for hypertension, hyperlipidemia, PAD complaining of retrosternal chest pain described as 6 out of 10, grabbing in nature, nonradiating and associated with diaphoresis, lightheadedness and nausea onset at 8:30 AM when she was sitting and eating breakfast. Pain lasted about 30 minutes and reduced to 3 out of 10 spontaneously. She had a low-dose 81 mg aspirin this morning, she states the pain would have been exacerbated by exertion, not reproducible to palpation. She denies emesis but states when she ate her breakfast this a.m. she felt like it struck stuck in her throat. Has history of atypical chest pain but states that this episode felt different in that the severity association with diaphoresis lightheadedness and nausea cardiologist is Dr. Mayford Knife. 20-pack-year history, quit 10 years ago. Patient denies fever, chills, cough, change in bowel or bladder habits, increasing peripheral edema.  HPI  Past Medical History:  Diagnosis Date  . Atypical chest pain    negative cardiolyte 09/2007 w Dr Mayford Knife  . CAD in native artery    a. Cor CT 02/2016 - poor quality due to motion/poor filling of coronary arteries with tortuosity, 2 isolated foci in LM (nonobstructive), calcium score 23rd percentile, normal left dominant coronary arteries with noted motion artifact and poor distal vessel visualization.  . Dyslipidemia   . Hypercholesteremia   . Hypertension   . PAC (premature atrial contraction)    Event monitors 10/2013 and 08/2014 showed NSR, ST, PACs, PVCs.   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 Posterior communicating artery aneurysm    followed by Dr Venetia Maxon  . PVC's (premature ventricular contractions)    Event monitors 10/2013 and 08/2014 showed NSR, ST, PACs, PVCs.     Patient Active Problem List   Diagnosis Date Noted  . Brain aneurysm 04/13/2016  . Hypertension   . Dyslipidemia   . CAD in native artery   . Chest pain 01/29/2016  . PAC (premature atrial contraction) 08/14/2014  . PVC's (premature ventricular contractions) 08/14/2014  . Palpitations 11/02/2013  . Hypercholesteremia   . Essential hypertension, benign 11/01/2013  . Peripheral vascular disease, unspecified 11/01/2013    Past Surgical History:  Procedure Laterality Date  . 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 IR GENERIC HISTORICAL  02/13/2016   IR RADIOLOGY PERIPHERAL GUIDED IV START 02/13/2016 Berdine Dance, MD [facility]  . IR GENERIC HISTORICAL  02/13/2016   IR US GUIDE VASC ACCESS LEFT 02/13/2016 Berdine Dance, MD [facility]    OB History    No data available       Home Medications    Prior to Admission medications   Medication Sig Start Date End Date Taking? Authorizing Provider  Ascorbic Acid (VITAMIN C) 100 MG tablet Take 100 mg by mouth daily.    Historical Provider, MD  aspirin 325 MG tablet Take 325 mg by mouth daily.    Historical Provider, MD  atorvastatin (LIPITOR) 80 MG tablet Take 80 mg  by mouth daily.    Historical Provider, MD  calcium carbonate (OS-CAL) 600 MG TABS tablet Take 600 mg by mouth daily with breakfast.    Historical Provider, MD  gabapentin (NEURONTIN) 300 MG capsule Take 300 mg by mouth 3 (three) times daily.    Historical Provider, MD  losartan-hydrochlorothiazide (HYZAAR) 100-25 MG per tablet Take 1 tablet by mouth daily.    Historical Provider, MD  meloxicam (MOBIC) 7.5 MG tablet Take 7.5 mg by mouth as needed for pain (once or twice a day as needed for shoulder/neck  pain).    Historical Provider, MD  Multiple Vitamin (MULTIVITAMIN) tablet Take 1 tablet by mouth daily.    Historical Provider, MD  Omega-3 Fatty Acids (FISH OIL PO) Take 1 capsule by mouth daily.     Historical Provider, MD  pantoprazole (PROTONIX) 40 MG tablet Take 1 tablet (40 mg total) by mouth daily. 02/17/16   Quintella Reichert, MD  QVAR 80 MCG/ACT inhaler Inhale 1 puff into the lungs as needed. For shortness of breath. 05/12/14   Historical Provider, MD  SALINE NASAL SPRAY NA Place into the nose. 2 sprays per nostril every 3-4 hours for upper respatory maintenance    Historical Provider, MD  vitamin B-12 (CYANOCOBALAMIN) 1000 MCG tablet Take 1,000 mcg by mouth daily.    Historical Provider, MD    Family History Family History  Problem Relation Age of Onset  . Heart attack Mother   . Heart disease Mother   . Heart attack Brother   . Heart disease Brother   . Heart attack Sister   . Heart disease Sister     Social History Social History  Substance Use Topics  . Smoking status: Former Smoker    Quit date: 07/13/2004  . Smokeless tobacco: Never Used  . Alcohol use Yes     Comment: ocassionally      Allergies   Codeine and Penicillins   Review of Systems Review of Systems   10 systems reviewed and found to be negative, except as noted in the HPI.  Physical Exam Updated Vital Signs BP 152/63 (BP Location: Left Arm)   Pulse 89   Temp 97.6 F (36.4 C) (Oral)   Resp 16   Ht 5\' 2"  (1.575 m)   Wt 61.2 kg   SpO2 99%   BMI 24.69 kg/m   Physical Exam  Constitutional: She is oriented to person, place, and time. She appears well-developed and well-nourished. No distress.  HENT:  Head: Normocephalic and atraumatic.  Mouth/Throat: Oropharynx is clear and moist.  Eyes: Conjunctivae and EOM are normal. Pupils are equal, round, and reactive to light.  Neck: Normal range of motion. No JVD present. No tracheal deviation present.  Cardiovascular: Normal rate, regular rhythm and  intact distal pulses.  Exam reveals no gallop and no friction rub.   No murmur heard. Pulmonary/Chest: Effort normal and breath sounds normal. No stridor. No respiratory distress. She has no wheezes. She has no rales. She exhibits no tenderness.  Abdominal: Soft. She exhibits no distension and no mass. There is no tenderness. There is no rebound and no guarding. No hernia.  Musculoskeletal: Normal range of motion. She exhibits no edema or tenderness.   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  Neurological: She is alert and oriented to person, place, and time.  Skin: Skin is warm. Capillary refill takes less than 2 seconds. She is not diaphoretic.  Psychiatric: She has a normal mood and  affect.  Nursing note and vitals reviewed.    ED Treatments / Results  Labs   (all labs ordered are listed, but only abnormal results are displayed) Labs Reviewed  BASIC METABOLIC PANEL - Abnormal; Notable for the following:       Result Value   Potassium 3.2 (*)    Glucose, Bld 113 (*)    All other components within normal limits  I-STAT TROPOININ, ED - Abnormal; Notable for the following:    Troponin i, poc 0.29 (*)    All other components within normal limits  CBC  HEPARIN LEVEL (UNFRACTIONATED)    EKG  EKG Interpretation  Date/Time:  Tuesday April 21 2016 10:56:47 EDT Ventricular Rate:  92 PR Interval:  164 QRS Duration: 84 QT Interval:  382 QTC Calculation: 472 R Axis:   52 Text Interpretation:  Normal sinus rhythm T wave abnormality, consider inferolateral ischemia Prolonged QT Abnormal ECG ST elevation in V2  and T wave iversion laterally, ST depression V3 ?STEMI? Confirmed by Kandis Mannan ([id number]) on 04/21/2016 11:32:20 AM       Radiology Dg Chest 2 View  Result Date: 04/21/2016 CLINICAL DATA:  Chest pain EXAM: CHEST  2 VIEW COMPARISON:  06/17/2015 FINDINGS: The heart size and mediastinal contours are within normal limits. Both  lungs are clear. The visualized skeletal structures are unremarkable. IMPRESSION: No active cardiopulmonary disease. Electronically Signed   By: Alcide Clever M.D.   On: 04/21/2016 11:48    Procedures Procedures (including critical care time)   CRITICAL CARE Performed by: Wynetta Emery   Total critical care time: 35 minutes  Critical care time was exclusive of separately billable procedures and treating other patients.  Critical care was necessary to treat or prevent imminent or life-threatening deterioration.  Critical care was time spent personally by me on the following activities: development of treatment plan with patient and/or surrogate as well as nursing, discussions with consultants, evaluation of patient's response to treatment, examination of patient, obtaining history from patient or surrogate, ordering and performing treatments and interventions, ordering and review of laboratory studies, ordering and review of radiographic studies, pulse oximetry and re-evaluation of patient's condition.  Medications Ordered in ED Medications  heparin bolus via infusion 3,500 Units (not administered)  heparin ADULT infusion 100 units/mL (25000 units/268mL sodium chloride 0.45%) (not administered)  0.9 %  sodium chloride infusion (not administered)  potassium chloride SA (K-DUR,KLOR-CON) CR tablet 40 mEq (not administered)  aspirin chewable tablet 324 mg (324 mg Oral Given 04/21/16 1208)     Initial Impression / Assessment and Plan / ED Course  I have reviewed the triage vital signs and the nursing notes.  Pertinent labs & imaging results that were available during my care of the patient were reviewed by me and considered in my medical decision making (see chart for details).  Clinical Course    Vitals:   04/21/16 1100  BP: 152/63  Pulse: 89  Resp: 16  Temp: 97.6 F (36.4 C)  TempSrc: Oral  SpO2: 99%  Weight: 61.2 kg  Height: 5\' 2"  (1.575 m)    Medications  heparin  bolus via infusion 3,500 Units (not administered)  heparin ADULT infusion 100 units/mL (25000 units/24mL sodium chloride 0.45%) (not administered)  0.9 %  sodium chloride infusion (not administered)  potassium chloride SA (K-DUR,KLOR-CON) CR tablet 40 mEq (not administered)  aspirin chewable tablet 324 mg (324 mg Oral Given 04/21/16 1208)    [name] is 75 y.o. female presenting with 7 out  of 10 retrosternal chest pain associated with diaphoresis, nausea and feeling lightheaded. Patient has elevated i-STAT troponin. Attending physician has discussed with STEMI doc Dr. [place] who agrees that the EKG does have changes concerning, it appears that the cath was delayed because she has a posterior communicating artery aneurysm and it was thought that this should be more properly defined before she was started on antiplatelet medications. They agree that heparin would be helpful to her. [name] will come to evaluate the patient ASAP, advised patient to remain nothing by mouth patient history Cath Lab.  After Turner and PA done have evaluated the patient, she will go directly to cath lab. Although we have not been able to get an IV on this patient they state that she is okay to go to cath lab and they will obtain IV upstairs.  Final Clinical Impressions(s) / ED Diagnoses   Final diagnoses:  NSTEMI (non-ST elevated myocardial infarction) [place](HCC)    New Prescriptions New Prescriptions   No medications on file     Wynetta Emeryicole , PA-C 04/21/16 1244    Courteney Lyn Mackuen, MD 04/29/16 [phone]

## 2016-04-21 NOTE — ED Notes (Signed)
Pt returns from MRI ° °

## 2016-04-21 NOTE — ED Notes (Signed)
Notified charge RN cannot get IV in pt.

## 2016-04-21 NOTE — ED Notes (Addendum)
EDP aware unable to maintain IV access.

## 2016-04-21 NOTE — ED Notes (Signed)
Zoll pads placed on patient.  

## 2016-04-21 NOTE — ED Notes (Signed)
Spoke to [place], with cardiology PA, pt will be going to cath lab within the hour. Pt initially expressing concerns of admission, PA speaking with pt via phone

## 2016-04-21 NOTE — ED Triage Notes (Signed)
Pt states "i had chest pains and feeling faint, indigestion, like I have to throw up." Pt states its in the center of her chest, like someone is grabbing her in the chest.

## 2016-04-21 NOTE — ED Notes (Signed)
Accompanied pt to MRI. ?

## 2016-04-21 NOTE — H&P (View-Only) (Signed)
Cardiology Office Note    Date:  04/13/2016  ID:  [name], DOB 22-Sep-1940, MRN [id number] PCP:  [name], MD  Cardiologist:  Dr. Mayford Knife   Chief Complaint: f/u chest pain  History of Present Illness:  [name] is a 75 y.o. 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 Venetia Maxon), PACs, PVCs who presents for f/u of chest pain. 2D echo 06/2014: mod focal basal septal hypertrophy, turbulence across the LV outflow but no sig gradient measured, EF 60-65%, no RWMA, mild SAM, mild LAE. BMET 01/2016 wnl. No recent CBC this yr in notes. Nuc 2015 was normal. Event monitors 10/2013 and 08/2014 showed NSR, ST, PACs, PVCs.   She saw Dr. Mayford Knife for chest pain August 2017 which she felt was related to indigestion. She had seen some relief with Tums. Cardiac CT 02/2016 showed poor quality study due to motion artifact and somewhat poor filling of coronary arteries with tortuosity, 2 isolated foci in LM (nonobstructive), calcium score 23rd percentile, normal left dominant coronary arteries with noted motion artifact and poor distal vessel visualization. Dr. Mayford Knife recommended she try Protonix instead of omeprazole.  The patient presented back for f/u 03/26/16 at which time she was continuing to have chest pain 1-2x a day, occasional radiation to right arm and right neck, described as indigestion, sometimes associated with mild SOB. It was occurring at random, both at rest or with exertion, but not every time she exerts herself. When it would occur during exertion, it was relieved by sitting or lying down to rest. She said there had been times after walking out to her car when she's wondering if she's about to have a heart attack because it takes a few minutes for her chest discomfort to settle down. At her OV 03/26/16 I spoke with Dr. Mayford Knife and we had recommended to proceed  with heart catheterization for definitive evaluation. However, in speaking with her neurosurgeon she has been actively working up her brain aneurysm to determine any progression. It was felt she would be at high risk for mortality if she were to be placed on DAPT and experience a rupture. I reviewed with Dr. Mayford Knife and we elected to put the procedure on hold until we had more answers about the aneurysm. We did not add Imdur due to patient's history of headaches and not wanting to alter cerebral bloodflow.  She returns to clinic today. Per review of Epic and patient report, [facility] contacted the patient to schedule it twice but she did not have a chance to call them back yet. She not had any episodes of chest pain that were prolonged. Symptoms are the same as above, several times a week, lasting 2-3 minutes at a time. Sometimes it feels like a grabbing sensation and she can rub her chest and it gets better. She's tried to take her time more with activity to prevent any more exertional-type symptoms.   Past Medical History:  Diagnosis Date  . Atypical chest pain    negative cardiolyte 09/2007 w Dr Mayford Knife  . CAD in native artery    a. Cor CT 02/2016 - poor quality due to motion/poor filling of coronary arteries with tortuosity, 2 isolated foci in LM (nonobstructive), calcium score 23rd percentile, normal left dominant coronary arteries with noted motion artifact and poor distal vessel visualization.  . Dyslipidemia   . Hypercholesteremia   . Hypertension   .  PAC (premature atrial contraction) 08/14/2014  . 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 Posterior communicating artery aneurysm    followed by Dr Venetia MaxonStern  . PVC's (premature ventricular contractions) 08/14/2014    Past Surgical History:  Procedure Laterality Date  . 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 IR GENERIC HISTORICAL  02/13/2016   IR RADIOLOGY  PERIPHERAL GUIDED IV START 02/13/2016 Berdine DanceMichael Shick, MD [facility]  . IR GENERIC HISTORICAL  02/13/2016   IR US GUIDE VASC ACCESS LEFT 02/13/2016 Berdine DanceMichael Shick, MD [facility]    Current Medications: Current Outpatient Prescriptions  Medication Sig Dispense Refill  . Ascorbic Acid (VITAMIN C) 100 MG tablet Take 100 mg by mouth daily.    [name]. aspirin 325 MG tablet Take 325 mg by mouth daily.    [name]. atorvastatin (LIPITOR) 80 MG tablet Take 80 mg by mouth daily.    . calcium carbonate (OS-CAL) 600 MG TABS tablet Take 600 mg by mouth daily with breakfast.    . gabapentin (NEURONTIN) 300 MG capsule Take 300 mg by mouth 3 (three) times daily.    [name]. losartan-hydrochlorothiazide (HYZAAR) 100-25 MG per tablet Take 1 tablet by mouth daily.    . meloxicam (MOBIC) 7.5 MG tablet Take 7.5 mg by mouth as needed for pain (once or twice a day as needed for shoulder/neck pain).    . Multiple Vitamin (MULTIVITAMIN) tablet Take 1 tablet by mouth daily.    . Omega-3 Fatty Acids (FISH OIL PO) Take 1 capsule by mouth daily.     . pantoprazole (PROTONIX) 40 MG tablet Take 1 tablet (40 mg total) by mouth daily. 30 tablet 11  . QVAR 80 MCG/ACT inhaler Inhale 1 puff into the lungs as needed. For shortness of breath.  0  . SALINE NASAL SPRAY NA Place into the nose. 2 sprays per nostril every 3-4 hours for upper respatory maintenance    . vitamin B-12 (CYANOCOBALAMIN) 1000 MCG tablet Take 1,000 mcg by mouth daily.     No current facility-administered medications for this visit.      Allergies:   Codeine and Penicillins   Social History   Social History  . Marital status: Single    Spouse name: N/A  . Number of children: N/A  . Years of education: N/A   Social History Main Topics  . Smoking status: Former Smoker    Quit date: 07/13/2004  . Smokeless tobacco: Never Used  . Alcohol use Yes     Comment: ocassionally   . Drug use: No  . Sexual activity: Not Asked   Other Topics Concern  . None   Social History  Narrative  . None     Family History:  The patient's family history includes Heart attack in her brother, mother, and sister; Heart disease in her brother, mother, and sister.   ROS:   Please see the history of present illness.  All other systems are reviewed and otherwise negative.    PHYSICAL EXAM:   VS:  BP (!) 130/58   Pulse 67   Ht 5\' 2"  (1.575 m)   Wt 135 lb 12.8 oz (61.6 kg)   BMI 24.84 kg/m   BMI: Body mass index is 24.84 kg/m. GEN: Well nourished, well developed AAF, in no acute distress  HEENT: normocephalic, atraumatic Neck: no JVD, carotid bruits, or masses Cardiac: RRR; soft holosystolic SEM, no rubs or  gallops, no edema  Respiratory:  clear to auscultation bilaterally, normal work of breathing GI: soft, nontender, nondistended, + BS MS: s/p R AKA Skin: warm and dry, no rash Neuro:  Alert and Oriented x 3, Strength and sensation are intact, follows commands Psych: euthymic mood, full affect  Wt Readings from Last 3 Encounters:  04/13/16 135 lb 12.8 oz (61.6 kg)  03/26/16 133 lb 12.8 oz (60.7 kg)  01/29/16 137 lb 9.6 oz (62.4 kg)      Studies/Labs Reviewed:   EKG:  EKG was ordered today and personally reviewed by me and demonstrates NSR 67bpm, nonspecific ST-T changes  Recent Labs: 01/29/2016: BUN 12; Creat 0.79; Potassium 4.1; Sodium 140   Lipid Panel No results found for: CHOL, TRIG, HDL, CHOLHDL, VLDL, LDLCALC, LDLDIRECT  Additional studies/ records that were reviewed today include: Summarized above.    ASSESSMENT & PLAN:   1. Chest pain/CAD - indeterminately evaluated by coronary CT; continues to have intermittent chest pain with mixed atypical/typical features. We are still awaiting workup of her brain MRI/MRA (see A/P from 9/14) to determine if further evaluation is needed for her brain aneurysm before committing her to cardiac catheterization. The patient went ahead and scheduled her brain imaging while in clinic today. This is scheduled for  04/23/16. As above, did not make any additions to anginal regimen as we do not want to exacerbate her chronic headaches or alter cerebral bloodflow. Note echocardiogram previously showed turbulence across the LVOT - in the interim will obtain 2D echo. Warning sx reviewed with patient. 2. Brain aneurysm - see above. 3. Essential HTN - generally controlled. Will not make any changes to regimen at present time - await LHC/brain aneurysm workup to help guide further regimen. 4. Dysplipidemia - continue statin. Will plan to check lipids/CMET with pre-cath labs as soon as a decision is made about timing.  Disposition: F/u with me on a day when Dr. Mayford Knifeurner is in the office after brain imaging.    Medication Adjustments/Labs and Tests Ordered: Current medicines are reviewed at length with the patient today.  Concerns regarding medicines are outlined above. Medication changes, Labs and Tests ordered today are summarized above and listed in the Patient Instructions accessible in Encounters.   Thomasene MohairSigned, Dayna Dunn PA-C  04/13/2016 2:32 PM    [facility] [address], [place], [place]  [address] Phone: [phone]) [phone]; Fax: [phone]

## 2016-04-21 NOTE — ED Notes (Signed)
Cardiology aware pt does not have IV access. Attempting to maintain IV access with ultrasound. Cardiologist aware if we cannot get IV she can go to cath lab.

## 2016-04-21 NOTE — Consult Note (Signed)
Imaging reviewed, case discussed.  No contraindication to cardiac catheterization.

## 2016-04-21 NOTE — H&P (Signed)
Cardiology History & Physical    Patient ID: [name] MRN: [id number], DOB: Sep 27, 1940 Date of Encounter: 04/21/2016, 12:15 PM Primary Physician: Astrid DivineGRIFFIN,[name] COLLINS, MD Primary Cardiologist: Dr. Mayford Knifeurner  Chief Complaint: chest pain Reason for Admission: probable MI Requesting MD: Dr. Corlis LeakMackuen  HPI: [name] is a 75 y.o. 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 Venetia MaxonStern), PACs, PVCs who [facility] with chest pain and elevated troponin. She was recently awaiting MR of the brain to evaluate for brain aneurysm progression before committing to definitive cardiac cath.  She saw Dr. Mayford Knifeurner for chest pain start in August 2017 which was felt related to indigestion. The patient had seen some relief with Tums. Cardiac CT 02/2016 showed poor quality study due to motion artifact and somewhat poor filling of coronary arteries with tortuosity, 2 isolated foci in LM (nonobstructive), calcium score 23rd percentile, normal left dominant coronary arteries with noted motion artifact and poor distal vessel visualization. Dr. Mayford Knifeurner recommended she try Protonix instead of omeprazole. I saw her back in follow-up 03/26/16 at which time she was still reporting chest pain with mixed features - occurring at random both at rest or with exertion but not occurring every single time she exerted herself, better with rubbing her chest. She had noticed a decrease in frequency of events with Protonix. At this visit, I reviewed with Dr. Mayford Knifeurner and we had recommended to proceed with heart catheterization for definitive evaluation. However, in speaking with her neurosurgeon, she was actually in the process of concomitantly working up her brain aneurysm to determine if there had been any progression. It was felt she would be at high risk for mortality if she were to be placed on DAPT and experience  a rupture. I reviewed with Dr. Mayford Knifeurner and the procedure was put on hold until we had more answers about the aneurysm. We did not add Imdur due to patient's history of headaches and not wanting to alter cerebral bloodflow. The brain MR was ordered but the patient did not return [facility]'s phone calls to schedule this by the time she returned to the cardiology clinic on 04/13/16. She finally scheduled the scan for 04/23/16. We planned to follow up the result as soon as it became available to go ahead and schedule cath. Echo was arranged in the interim. Before the MR could be completed, [facility] with worsening chest pain, feeling faint, indigestion, and nausea. She was at work this morning in her usual state of health. She says she had actually not had any more exertional symptoms recently, but this morning while at work after eating donut crumbs, developed the above symptoms. They lasted about 1-2 hours and her coworkers insisted she go to the ER for evaluation. Initial troponin 0.29. EKG shows new ST-T changes compared to prior. She is currently chest pain free. IV access has not yet been able to be obtained due to difficult stick.    Past Medical History:  Diagnosis Date  . Atypical chest pain    negative cardiolyte 09/2007 w Dr Mayford Knifeurner  . CAD in native artery    a. Cor CT 02/2016 - poor quality due to motion/poor filling of coronary arteries with tortuosity, 2 isolated foci in LM (nonobstructive), calcium score 23rd percentile, normal left dominant coronary arteries with noted motion artifact and poor distal vessel visualization.  . Dyslipidemia   . Hypercholesteremia   .  Hypertension   . PAC (premature atrial contraction) 08/14/2014  . 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 Posterior communicating artery aneurysm    followed by Dr Venetia Maxon  . PVC's (premature ventricular contractions) 08/14/2014      Surgical History:  Past Surgical History:  Procedure Laterality Date  . 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 IR GENERIC HISTORICAL  02/13/2016   IR RADIOLOGY PERIPHERAL GUIDED IV START 02/13/2016 Berdine Dance, MD [facility]  . IR GENERIC HISTORICAL  02/13/2016   IR US GUIDE VASC ACCESS LEFT 02/13/2016 Berdine Dance, MD [facility]     Home Meds: Prior to Admission medications   Medication Sig Start Date End Date Taking? Authorizing Provider  Ascorbic Acid (VITAMIN C) 100 MG tablet Take 100 mg by mouth daily.    Historical Provider, MD  aspirin 325 MG tablet Take 325 mg by mouth daily.    Historical Provider, MD  atorvastatin (LIPITOR) 80 MG tablet Take 80 mg by mouth daily.    Historical Provider, MD  calcium carbonate (OS-CAL) 600 MG TABS tablet Take 600 mg by mouth daily with breakfast.    Historical Provider, MD  gabapentin (NEURONTIN) 300 MG capsule Take 300 mg by mouth 3 (three) times daily.    Historical Provider, MD  losartan-hydrochlorothiazide (HYZAAR) 100-25 MG per tablet Take 1 tablet by mouth daily.    Historical Provider, MD  meloxicam (MOBIC) 7.5 MG tablet Take 7.5 mg by mouth as needed for pain (once or twice a day as needed for shoulder/neck pain).    Historical Provider, MD  Multiple Vitamin (MULTIVITAMIN) tablet Take 1 tablet by mouth daily.    Historical Provider, MD  Omega-3 Fatty Acids (FISH OIL PO) Take 1 capsule by mouth daily.     Historical Provider, MD  pantoprazole (PROTONIX) 40 MG tablet Take 1 tablet (40 mg total) by mouth daily. 02/17/16   Quintella Reichert, MD  QVAR 80 MCG/ACT inhaler Inhale 1 puff into the lungs as needed. For shortness of breath. 05/12/14   Historical Provider, MD  SALINE NASAL SPRAY NA Place into the nose. 2 sprays per nostril every 3-4 hours for upper respatory maintenance    Historical Provider, MD  vitamin B-12 (CYANOCOBALAMIN) 1000 MCG tablet Take 1,000 mcg by mouth daily.    Historical Provider, MD    Allergies:   Allergies  Allergen Reactions  . Codeine     Itching and rash  . Penicillins     Itching and rash    Social History   Social History  . Marital status: Single    Spouse name: N/A  . Number of children: N/A  . Years of education: N/A   Occupational History  . Not on file.   Social History Main Topics  . Smoking status: Former Smoker    Quit date: 07/13/2004  . Smokeless tobacco: Never Used  . Alcohol use Yes     Comment: ocassionally   . Drug use: No  . Sexual activity: Not on file   Other Topics Concern  . Not on file   Social History Narrative  . No narrative on file     Family History  Problem Relation Age of Onset  . Heart attack Mother   . Heart disease Mother   . Heart attack Brother   . Heart disease Brother   . Heart attack Sister   .  Heart disease Sister     Review of Systems: All other systems reviewed and are otherwise negative except as noted above.  Labs:   Lab Results  Component Value Date   WBC 7.3 04/21/2016   HGB 13.0 04/21/2016   HCT 40.6 04/21/2016   MCV 92.1 04/21/2016   PLT 185 04/21/2016    Recent Labs Lab 04/21/16 1057  NA 136  K 3.2*  CL 102  CO2 25  BUN 17  CREATININE 0.85  CALCIUM 10.2  GLUCOSE 113*    Radiology/Studies:  Dg Chest 2 View  Result Date: 04/21/2016 CLINICAL DATA:  Chest pain EXAM: CHEST  2 VIEW COMPARISON:  06/17/2015 FINDINGS: The heart size and mediastinal contours are within normal limits. Both lungs are clear. The visualized skeletal structures are unremarkable. IMPRESSION: No active cardiopulmonary disease. Electronically Signed   By: Alcide Clever M.D.   On: 04/21/2016 11:48   Wt Readings from Last 3 Encounters:  04/21/16 135 lb (61.2 kg)  04/13/16 135 lb 12.8 oz (61.6 kg)  03/26/16 133 lb 12.8 oz (60.7 kg)    EKG: NSR 92bpm, minimal ST elevation I, avL (<0.33mm), TWI inferiorly II, III, avF, V4-V6 with mild ST sagging, biphasic in V3, new from before.  Physical Exam: Blood pressure  152/63, pulse 89, temperature 97.6 F (36.4 C), temperature source Oral, resp. rate 16, height 5\' 2"  (1.575 m), weight 135 lb (61.2 kg), SpO2 99 %. Body mass index is 24.69 kg/m. General: Well developed, well nourished AAF, in no acute distress. Head: Normocephalic, atraumatic, sclera non-icteric, no xanthomas, nares are without discharge.  Neck: Negative for carotid bruits. JVD not elevated. Lungs: Clear bilaterally to auscultation without wheezes, rales, or rhonchi. Breathing is unlabored. Heart: RRR with S1 S2. Soft holosystolic SEM. No murmurs, rubs, or gallops appreciated. Abdomen: Soft, non-tender, non-distended with normoactive bowel sounds. No hepatomegaly. No rebound/guarding. No obvious abdominal masses. Msk:  Strength and tone appear normal for age. Extremities: No clubbing or cyanosis. No edema. S/p R AKA. Neuro: Alert and oriented X 3. No focal deficit. No facial asymmetry. Moves all extremities spontaneously. Psych:  Responds to questions appropriately with a normal affect.    Assessment and Plan  75 y.o. 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 Venetia Maxon), PACs, PVCs, prior turbulence along LV outflow without gradient 2015 who presented to [facility] with chest pain and elevated troponin. She was recently awaiting MR of the brain to evaluate for brain aneurysm progression before committing to definitive cardiac cath.  1. Suspected NSTEMI/evolving MI  2. Posterior noncommunicating artery aneurysm  3. Essential HTN 4. Hypokalemia 5. PAD s/p R BKA 2008 6. Dyslipidemia  Patient seen/examined urgently. Difficult situation - initially we had plan to take patient urgently to cath lab. Dr. Mayford Knife reviewed with Dr. Eldridge Dace and the preference is to obtain brain MR stat first as patient is currently pain free. Spoke to MR department to obtain emergently. Hold off  heparin until this is obtained. She has already received ASA 324mg  and potassium . Have already consented patient for cath. Risks and benefits of cardiac catheterization have been discussed with the patient. These include bleeding, infection, kidney damage, stroke, heart attack, death. The patient understands these risks and is willing to proceed. Will f/u result to determine next step.   Addendum: we ordered same study that was ordered by neuro surgery but this gave incomplete visualization of the known aneurysm - "  Further evaluation with dedicated MRA is recommended for direct comparative purposes." I spoke with Dr. Bevely Palmer, on call for [facility], who has cleared patient for cardiac catheterization and use of DAPT if necessary since aneurysm is not acutely ruptured and there is an acute coronary syndrome occurring. This is obviously understanding there is a risk of mortality with aneurysm rupture, but at present time her cardiac state trumps importance with regards of further evaluation. Patient, nurse and EDPA updated with plan. The patient seemed somewhat taken aback that she has to remain in the hospital for this evaluation but we explained the importance of further cardiac eval and monitoring. F/u EKG shows evolving MI with QT prolongtation. Will give additional potassium supplementation and check Mg level with care order to call if 1.8 or less. Pt is on board for next case.  Charlyn Minerva PA-C 04/21/2016, 12:15 PM Pager: [phone]

## 2016-04-21 NOTE — Progress Notes (Signed)
ANTICOAGULATION CONSULT NOTE - Initial Consult  Pharmacy Consult for heparin Indication: chest pain/ACS  Allergies  Allergen Reactions  . Codeine     Itching and rash  . Penicillins     Itching and rash    Patient Measurements: Height: 5\' 2"  (157.5 cm) Weight: 135 lb (61.2 kg) IBW/kg (Calculated) : 50.1 Heparin Dosing Weight: 61 kg  Vital Signs: Temp: 97.6 F (36.4 C) (10/10 1100) Temp Source: Oral (10/10 1100) BP: 152/63 (10/10 1100) Pulse Rate: 89 (10/10 1100)  Labs:  Recent Labs  04/21/16 1057  HGB 13.0  HCT 40.6  PLT 185  CREATININE 0.85    Estimated Creatinine Clearance: 47.7 mL/min (by C-G formula based on SCr of 0.85 mg/dL).  Assessment: 75 yo f presenting with CP  PMH: CAD, HLD, HTN, PAD  Anticoag: none pta. IV hep for r/o ACS  Nephro: SCr 0.82  Heme/Onc: H&H 13/40.6, Plt 185  Goal of Therapy:  Heparin level 0.3-0.7 units/ml Monitor platelets by anticoagulation protocol: Yes   Plan:  Heparin bolus 3500 units x1 Heparin infusion 700 units/hr 2100 HL Daily HL, CBC F/U Cards plans  Isaac BlissMichael , PharmD, BCPS, [facility] Clinical Pharmacist Pager [phone] 04/21/2016 12:32 PM

## 2016-04-22 ENCOUNTER — Other Ambulatory Visit (HOSPITAL_COMMUNITY): Payer: Medicare Other

## 2016-04-22 ENCOUNTER — Encounter (HOSPITAL_COMMUNITY): Payer: Self-pay | Admitting: Interventional Cardiology

## 2016-04-22 DIAGNOSIS — I214 Non-ST elevation (NSTEMI) myocardial infarction: Principal | ICD-10-CM

## 2016-04-22 LAB — BASIC METABOLIC PANEL
ANION GAP: 8 (ref 5–15)
BUN: 19 mg/dL (ref 6–20)
CALCIUM: 9.3 mg/dL (ref 8.9–10.3)
CHLORIDE: 102 mmol/L (ref 101–111)
CO2: 27 mmol/L (ref 22–32)
Creatinine, Ser: 0.94 mg/dL (ref 0.44–1.00)
GFR calc non Af Amer: 57 mL/min — ABNORMAL LOW (ref >60)
GLUCOSE: 106 mg/dL — AB (ref 65–99)
POTASSIUM: 3.6 mmol/L (ref 3.5–5.1)
Sodium: 137 mmol/L (ref 135–145)

## 2016-04-22 LAB — LIPID PANEL
CHOL/HDL RATIO: 3.9 ratio
CHOLESTEROL: 176 mg/dL (ref 0–200)
HDL: 45 mg/dL (ref >40)
LDL Cholesterol: 79 mg/dL (ref 0–99)
Triglycerides: 260 mg/dL — ABNORMAL HIGH (ref <150)
VLDL: 52 mg/dL — ABNORMAL HIGH (ref 0–40)

## 2016-04-22 LAB — CBC
HEMATOCRIT: 36.9 % (ref 36.0–46.0)
Hemoglobin: 11.8 g/dL — ABNORMAL LOW (ref 12.0–15.0)
MCH: 28.6 pg (ref 26.0–34.0)
MCHC: 32 g/dL (ref 30.0–36.0)
MCV: 89.6 fL (ref 78.0–100.0)
Platelets: 148 10*3/uL — ABNORMAL LOW (ref 150–400)
RBC: 4.12 MIL/uL (ref 3.87–5.11)
RDW: 14.5 % (ref 11.5–15.5)
WBC: 6.2 10*3/uL (ref 4.0–10.5)

## 2016-04-22 LAB — TROPONIN I
TROPONIN I: 0.47 ng/mL — AB (ref <0.03)
Troponin I: 0.34 ng/mL (ref <0.03)

## 2016-04-22 MED ORDER — METOPROLOL SUCCINATE ER 25 MG PO TB24
25.0000 mg | ORAL_TABLET | Freq: Every day | ORAL | Status: DC
  Administered 2016-04-22 – 2016-04-23 (×2): 25 mg via ORAL
  Filled 2016-04-22 (×2): qty 1

## 2016-04-22 MED ORDER — INFLUENZA VAC SPLIT QUAD 0.5 ML IM SUSY
0.5000 mL | PREFILLED_SYRINGE | INTRAMUSCULAR | Status: AC
  Administered 2016-04-23: 0.5 mL via INTRAMUSCULAR
  Filled 2016-04-22: qty 0.5

## 2016-04-22 MED ORDER — ATORVASTATIN CALCIUM 80 MG PO TABS
80.0000 mg | ORAL_TABLET | Freq: Every day | ORAL | Status: DC
  Administered 2016-04-22: 22:00:00 80 mg via ORAL
  Filled 2016-04-22 (×2): qty 1

## 2016-04-22 MED ORDER — ISOSORBIDE MONONITRATE ER 30 MG PO TB24
15.0000 mg | ORAL_TABLET | Freq: Every day | ORAL | Status: DC
  Administered 2016-04-22 – 2016-04-23 (×2): 15 mg via ORAL
  Filled 2016-04-22 (×2): qty 1

## 2016-04-22 MED ORDER — OMEGA-3-ACID ETHYL ESTERS 1 G PO CAPS
1.0000 g | ORAL_CAPSULE | Freq: Every day | ORAL | Status: DC
  Administered 2016-04-22: 1 g via ORAL
  Filled 2016-04-22: qty 1

## 2016-04-22 NOTE — Care Management Note (Signed)
Case Management Note  Patient Details  Name: [name] MRN: [id number] Date of Birth: 10-27-1940  Subjective/Objective:  Presents with NSTEMI, she lives alone she has a r leg prosthesis , which she has an apt on Tuesday for a new prosthesis.  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  She has a PCP, Maurice SmallElaine Griffin, she has medication coverage and she will have transportation home at dc.  She uses a cane, a rolling walker, a w/chair.  She uses the rolling walker a lot when she is volunteering.  MD is making medication adjustments in meds, plan for dc tomorrow.  NCM will cont to follow for dc needs.                  Action/Plan:   Expected Discharge Date:                  Expected Discharge Plan:  Home w Home Health Services  In-House Referral:     Discharge planning Services  CM Consult  Post Acute Care Choice:    Choice offered to:     DME Arranged:    DME Agency:     HH Arranged:    HH Agency:     Status of Service:  In process, will continue to follow  If discussed at Long Length of Stay Meetings, dates discussed:    Additional Comments:  Leone Havenaylor,  [place], RN 04/22/2016, 11:55 AM

## 2016-04-22 NOTE — Progress Notes (Signed)
Patient Name: [name] Date of Encounter: 04/22/2016  Hospital Problem List     Active Problems:   Hypertension   Dyslipidemia   CAD in native artery   ACS (acute coronary syndrome) [facility])   NSTEMI (non-ST elevated myocardial infarction) (HCC)    Subjective   Reports some angina this morning, rates 4/10 worse with movement.   Inpatient Medications    . aspirin EC  81 mg Oral Daily  . atorvastatin  80 mg Oral Daily  . gabapentin  300 mg Oral TID  . losartan  100 mg Oral Daily   And  . hydrochlorothiazide  25 mg Oral Daily  . multivitamin with minerals  1 tablet Oral Daily  . omega-3 acid ethyl esters  1 g Oral Daily  . pantoprazole  40 mg Oral Daily  . potassium chloride  40 mEq Oral Once  . sodium chloride flush  3 mL Intravenous Q12H  . vitamin B-12  1,000 mcg Oral Daily    Vital Signs    Vitals:   04/21/16 2000 04/21/16 2100 04/22/16 0239 04/22/16 0650  BP: (!) 110/35 (!) 138/46 (!) 122/33 (!) 118/44  Pulse: 83 69 88 64  Resp: 17 (!) 36 14 15  Temp:   97.8 F (36.6 C)   TempSrc:   Oral   SpO2: 96% 98% 97%   Weight:   134 lb 14.7 oz (61.2 kg)   Height:        Intake/Output Summary (Last 24 hours) at 04/22/16 0810 Last data filed at 04/21/16 2232  Gross per 24 hour  Intake           255.24 ml  Output                0 ml  Net           255.24 ml   Filed Weights   04/21/16 1100 04/22/16 0239  Weight: 135 lb (61.2 kg) 134 lb 14.7 oz (61.2 kg)    Physical Exam    General: Pleasant older AA female, NAD. Neuro: Alert and oriented X 3. Moves all extremities spontaneously. Psych: Normal affect. HEENT:  Normal  Neck: Supple without bruits or JVD. Lungs:  Resp regular and unlabored, CTA. Heart: RRR no s3, s4, holosystolic murmur. Abdomen: Soft, non-tender, non-distended, BS + x 4.  Extremities: No clubbing, cyanosis or edema. S/p R AKA.  Labs    CBC  Recent Labs  04/21/16 1057 04/22/16 0605  WBC 7.3 6.2  HGB 13.0 11.8*  HCT 40.6 36.9    MCV 92.1 89.6  PLT 185 148*   Basic Metabolic Panel  Recent Labs  04/21/16 1057 04/22/16 0605  NA 136 137  K 3.2* 3.6  CL 102 102  CO2 25 27  GLUCOSE 113* 106*  BUN 17 19  CREATININE 0.85 0.94  CALCIUM 10.2 9.3  MG 2.0  --    Liver Function Tests  Recent Labs  04/21/16 1925  AST 28  ALT 15  ALKPHOS 42  BILITOT 0.8  PROT 6.4*  ALBUMIN 3.8   No results for input(s): LIPASE, AMYLASE in the last 72 hours. Cardiac Enzymes  Recent Labs  04/21/16 1925 04/22/16 0017 04/22/16 0605  TROPONINI 0.47* 0.34* 0.47*   BNP Invalid input(s): POCBNP D-Dimer No results for input(s): DDIMER in the last 72 hours. Hemoglobin A1C No results for input(s): HGBA1C in the last 72 hours. Fasting Lipid Panel  Recent Labs  04/22/16 0017  CHOL 176  HDL 45  LDLCALC 79  TRIG 260*  CHOLHDL 3.9   Thyroid Function Tests No results for input(s): TSH, T4TOTAL, T3FREE, THYROIDAB in the last 72 hours.  Invalid input(s): FREET3  Telemetry    SR, PVCs  ECG    SR, TWI in anterolateral leads with prolong QT, (no change)  Radiology    Dg Chest 2 View  Result Date: 04/21/2016 CLINICAL DATA:  Chest pain EXAM: CHEST  2 VIEW COMPARISON:  06/17/2015 FINDINGS: The heart size and mediastinal contours are within normal limits. Both lungs are clear. The visualized skeletal structures are unremarkable. IMPRESSION: No active cardiopulmonary disease. Electronically Signed   By: Alcide Clever M.D.   On: 04/21/2016 11:48   Mr Laqueta Jean ZO Contrast  Result Date: 04/21/2016 CLINICAL DATA:  Initial evaluation for ring aneurysm. Patient with history of an STEMI, to undergo cardiac catheterization. EXAM: MRI HEAD WITHOUT AND WITH CONTRAST TECHNIQUE: Multiplanar, multiecho pulse sequences of the brain and surrounding structures were obtained without and with intravenous contrast. CONTRAST:  13mL MULTIHANCE GADOBENATE DIMEGLUMINE 529 MG/ML IV SOLN COMPARISON:  Prior MRI from 01/22/2010. FINDINGS: Brain:  Mild diffuse prominence of the CSF containing spaces is compatible with generalized cerebral atrophy, stable. Mild patchy T2/FLAIR hyperintensity within the periventricular, deep, and subcortical white matter both cerebral hemispheres is present, most consistent with chronic micro vessel ischemic disease, mild for age. Remote lacunar infarct present within the left basal ganglia/ corona radiata, stable. No abnormal foci of restricted diffusion to suggest acute or subacute ischemia. Gray-white matter differentiation maintained. No acute or chronic intracranial hemorrhage. No mass lesion, midline shift, or mass effect. No hydrocephalus. No extra-axial fluid collection. Major dural sinuses are patent. No abnormal enhancement. Pituitary gland within normal limits. Vascular: Major intracranial vascular flow voids are maintained. Previously identified left supraclinoid aneurysm ease partially visualized, grossly similar, however, not well evaluated on this exam. Dedicated imaging with MRA is recommended. Skull and upper cervical spine: Craniocervical junction within normal limits. Visualized upper cervical spine unremarkable without significant degenerative changes are stenosis. Bone marrow signal intensity within normal limits. No scalp soft tissue abnormality. Sinuses/Orbits: Globes and orbital soft tissues are within normal limits. Patient is status post lens extraction bilaterally. Mild scattered mucosal thickening within the ethmoidal air cells and maxillary sinuses. No air-fluid level to suggest active sinus infection. No mastoid effusion. Inner ear structures grossly normal. Other: No other significant finding. IMPRESSION: 1. No acute intracranial process. 2. Remote lacunar infarct within the left basal ganglia/corona radiata, stable. 3. Mild chronic microvascular ischemic disease, stable. 4. 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 Further evaluation with dedicated MRA is recommended  for direct comparative purposes. Electronically Signed   By: Rise Mu M.D.   On: 04/21/2016 15:50    Assessment & Plan    Ms. 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 She was recently awaiting MR of the brain to evaluate for brain aneurysm progression before committing to definitive cardiac cath.  1. Chest pain: underwent LHC yesterday with Dr. Eldridge Dace showing Mid Cx 25% lesion, and Ost 1st diag 75% lesion, and EF of 35-45% with pattern suggestive of Takotsubo cardiomyopathy. Recommendation to for medical therapy. F/u morning labs are stable. Trop peak at 0.47. -- Cr stable to add low dose ACEi this morning.  -- Does continue to  have intermittent chest pain this morning. May benefit from long acting nitrate?  2. HLD: On high dose statin  3. HTN: controlled  Signed, Laverda PageLindsay  NP-C Pager [phone]

## 2016-04-23 ENCOUNTER — Other Ambulatory Visit: Payer: Medicare Other

## 2016-04-23 DIAGNOSIS — I5181 Takotsubo syndrome: Secondary | ICD-10-CM

## 2016-04-23 LAB — CBC
HCT: 34.3 % — ABNORMAL LOW (ref 36.0–46.0)
HEMOGLOBIN: 11.1 g/dL — AB (ref 12.0–15.0)
MCH: 29 pg (ref 26.0–34.0)
MCHC: 32.4 g/dL (ref 30.0–36.0)
MCV: 89.6 fL (ref 78.0–100.0)
PLATELETS: 142 10*3/uL — AB (ref 150–400)
RBC: 3.83 MIL/uL — AB (ref 3.87–5.11)
RDW: 14.6 % (ref 11.5–15.5)
WBC: 7.9 10*3/uL (ref 4.0–10.5)

## 2016-04-23 MED ORDER — LOSARTAN POTASSIUM 50 MG PO TABS
50.0000 mg | ORAL_TABLET | Freq: Every day | ORAL | Status: DC
  Administered 2016-04-23: 50 mg via ORAL
  Filled 2016-04-23: qty 1

## 2016-04-23 MED ORDER — PANTOPRAZOLE SODIUM 40 MG PO TBEC: 40.0000 mg | DELAYED_RELEASE_TABLET | Freq: Every day | ORAL | 11 refills | Status: DC

## 2016-04-23 MED ORDER — ACETAMINOPHEN 325 MG PO TABS: 650.0000 mg | ORAL_TABLET | ORAL | Status: AC | PRN

## 2016-04-23 MED ORDER — ISOSORBIDE MONONITRATE ER 30 MG PO TB24: 15.0000 mg | ORAL_TABLET | Freq: Every day | ORAL | 3 refills | Status: DC

## 2016-04-23 MED ORDER — FUROSEMIDE 20 MG PO TABS: 20.0000 mg | ORAL_TABLET | Freq: Every day | ORAL | 3 refills | Status: DC | PRN

## 2016-04-23 MED ORDER — ASPIRIN 81 MG PO TBEC: 81.0000 mg | DELAYED_RELEASE_TABLET | Freq: Every day | ORAL | Status: AC

## 2016-04-23 MED ORDER — METOPROLOL SUCCINATE ER 25 MG PO TB24: 25.0000 mg | ORAL_TABLET | Freq: Every day | ORAL | 3 refills | Status: DC

## 2016-04-23 MED ORDER — LOSARTAN POTASSIUM 50 MG PO TABS: 50.0000 mg | ORAL_TABLET | Freq: Every day | ORAL | 3 refills | Status: DC

## 2016-04-23 MED ORDER — FUROSEMIDE 20 MG PO TABS: 20.0000 mg | ORAL_TABLET | Freq: Every day | ORAL | Status: DC | PRN

## 2016-04-23 NOTE — Progress Notes (Signed)
DAILY PROGRESS NOTE  Subjective:  Feels better today. Some belching. BP low transiently overnight.   Objective:  Temp:  [97.7 F (36.5 C)-98.3 F (36.8 C)] 98 F (36.7 C) (10/12 0809) Pulse Rate:  [65-82] 82 (10/12 0809) Resp:  [11-28] 24 (10/12 0809) BP: (75-132)/(31-57) 132/57 (10/12 0809) SpO2:  [94 %-96 %] 94 % (10/12 0809) Weight:  [128 lb 15.5 oz (58.5 kg)] 128 lb 15.5 oz (58.5 kg) (10/12 0332) Weight change: -6 lb 0.5 oz (-2.735 kg)  Intake/Output from previous day: 10/11 0701 - 10/12 0700 In: -  Out: 1100 [Urine:1100]  Intake/Output from this shift: No intake/output data recorded.  Medications: No current facility-administered medications on file prior to encounter.    Current Outpatient Prescriptions on File Prior to Encounter  Medication Sig Dispense Refill  . Ascorbic Acid (VITAMIN C) 100 MG tablet Take 100 mg by mouth daily.    [name] aspirin 325 MG tablet Take 325 mg by mouth daily.    [name] atorvastatin (LIPITOR) 80 MG tablet Take 80 mg by mouth daily.    . calcium carbonate (OS-CAL) 600 MG TABS tablet Take 600 mg by mouth daily with breakfast.    . gabapentin (NEURONTIN) 300 MG capsule Take 300 mg by mouth 3 (three) times daily.    [name] losartan-hydrochlorothiazide (HYZAAR) 100-25 MG per tablet Take 1 tablet by mouth daily.    . meloxicam (MOBIC) 7.5 MG tablet Take 7.5 mg by mouth as needed for pain (once or twice a day as needed for shoulder/neck pain).    . Multiple Vitamin (MULTIVITAMIN) tablet Take 1 tablet by mouth daily.    . Omega-3 Fatty Acids (FISH OIL PO) Take 1 capsule by mouth daily.     . pantoprazole (PROTONIX) 40 MG tablet Take 1 tablet (40 mg total) by mouth daily. (Patient taking differently: Take 40 mg by mouth daily as needed. Acid reflux) 30 tablet 11  . QVAR 80 MCG/ACT inhaler Inhale 1 puff into the lungs as needed. For shortness of breath.  0  . SALINE NASAL SPRAY NA Place into the nose. 2 sprays per nostril every 3-4 hours for upper respatory  maintenance    . vitamin B-12 (CYANOCOBALAMIN) 1000 MCG tablet Take 1,000 mcg by mouth daily.      Physical Exam: General appearance: alert and no distress Lungs: clear to auscultation bilaterally Heart: regular rate and rhythm, S1, S2 normal, no murmur, click, rub or gallop Pulses: 2+ and symmetric Neurologic: Grossly normal  Lab Results: Results for orders placed or performed during the hospital encounter of 04/21/16 (from the past 48 hour(s))  Basic metabolic panel     Status: Abnormal   Collection Time: 04/21/16 10:57 AM  Result Value Ref Range   Sodium 136 135 - 145 mmol/L   Potassium 3.2 (L) 3.5 - 5.1 mmol/L   Chloride 102 101 - 111 mmol/L   CO2 25 22 - 32 mmol/L   Glucose, Bld 113 (H) 65 - 99 mg/dL   BUN 17 6 - 20 mg/dL   Creatinine, Ser 0.85 0.44 - 1.00 mg/dL   Calcium 10.2 8.9 - 10.3 mg/dL   GFR calc non Af Amer >60 >60 mL/min   GFR calc Af Amer >60 >60 mL/min    Comment: (NOTE) The eGFR has been calculated using the CKD EPI equation. This calculation has not been validated in all clinical situations. eGFR's persistently <60 mL/min signify possible Chronic Kidney Disease.    Anion gap 9 5 - 15  CBC  Status: None   Collection Time: 04/21/16 10:57 AM  Result Value Ref Range   WBC 7.3 4.0 - 10.5 K/uL   RBC 4.41 3.87 - 5.11 MIL/uL   Hemoglobin 13.0 12.0 - 15.0 g/dL   HCT 40.6 36.0 - 46.0 %   MCV 92.1 78.0 - 100.0 fL   MCH 29.5 26.0 - 34.0 pg   MCHC 32.0 30.0 - 36.0 g/dL   RDW 14.4 11.5 - 15.5 %   Platelets 185 150 - 400 K/uL  Protime-INR     Status: None   Collection Time: 04/21/16 10:57 AM  Result Value Ref Range   Prothrombin Time 12.8 11.4 - 15.2 seconds   INR 0.96   Magnesium     Status: None   Collection Time: 04/21/16 10:57 AM  Result Value Ref Range   Magnesium 2.0 1.7 - 2.4 mg/dL  I-stat troponin, ED     Status: Abnormal   Collection Time: 04/21/16 11:14 AM  Result Value Ref Range   Troponin i, poc 0.29 (HH) 0.00 - 0.08 ng/mL   Comment  NOTIFIED PHYSICIAN    Comment 3            Comment: Due to the release kinetics of cTnI, a negative result within the first hours of the onset of symptoms does not rule out myocardial infarction with certainty. If myocardial infarction is still suspected, repeat the test at appropriate intervals.   Troponin I     Status: Abnormal   Collection Time: 04/21/16  7:25 PM  Result Value Ref Range   Troponin I 0.47 (HH) <0.03 ng/mL    Comment: CRITICAL RESULT CALLED TO, READ BACK BY AND VERIFIED WITH: OSTERMAN,R RN _0  BY GRINSTEAD,C 10.10.17   Hepatic function panel     Status: Abnormal   Collection Time: 04/21/16  7:25 PM  Result Value Ref Range   Total Protein 6.4 (L) 6.5 - 8.1 g/dL   Albumin 3.8 3.5 - 5.0 g/dL   AST 28 15 - 41 U/L   ALT 15 14 - 54 U/L   Alkaline Phosphatase 42 38 - 126 U/L   Total Bilirubin 0.8 0.3 - 1.2 mg/dL   Bilirubin, Direct 0.1 0.1 - 0.5 mg/dL   Indirect Bilirubin 0.7 0.3 - 0.9 mg/dL  Troponin I     Status: Abnormal   Collection Time: 04/22/16 12:17 AM  Result Value Ref Range   Troponin I 0.34 (HH) <0.03 ng/mL    Comment: CRITICAL VALUE NOTED.  VALUE IS CONSISTENT WITH PREVIOUSLY REPORTED AND CALLED VALUE.  Lipid panel     Status: Abnormal   Collection Time: 04/22/16 12:17 AM  Result Value Ref Range   Cholesterol 176 0 - 200 mg/dL   Triglycerides 260 (H) <150 mg/dL   HDL 45 >40 mg/dL   Total CHOL/HDL Ratio 3.9 RATIO   VLDL 52 (H) 0 - 40 mg/dL   LDL Cholesterol 79 0 - 99 mg/dL    Comment:        Total Cholesterol/HDL:CHD Risk Coronary Heart Disease Risk Table                     Men   Women  1/2 Average Risk   3.4   3.3  Average Risk       5.0   4.4  2 X Average Risk   9.6   7.1  3 X Average Risk  23.4   11.0        Use the calculated Patient Ratio  above and the CHD Risk Table to determine the patient's CHD Risk.        ATP III CLASSIFICATION (LDL):  <100     mg/dL   Optimal  100-129  mg/dL   Near or Above                    Optimal   130-159  mg/dL   Borderline  160-189  mg/dL   High  >190     mg/dL   Very High   CBC     Status: Abnormal   Collection Time: 04/22/16  6:05 AM  Result Value Ref Range   WBC 6.2 4.0 - 10.5 K/uL   RBC 4.12 3.87 - 5.11 MIL/uL   Hemoglobin 11.8 (L) 12.0 - 15.0 g/dL   HCT 36.9 36.0 - 46.0 %   MCV 89.6 78.0 - 100.0 fL   MCH 28.6 26.0 - 34.0 pg   MCHC 32.0 30.0 - 36.0 g/dL   RDW 14.5 11.5 - 15.5 %   Platelets 148 (L) 150 - 400 K/uL  Basic metabolic panel     Status: Abnormal   Collection Time: 04/22/16  6:05 AM  Result Value Ref Range   Sodium 137 135 - 145 mmol/L   Potassium 3.6 3.5 - 5.1 mmol/L   Chloride 102 101 - 111 mmol/L   CO2 27 22 - 32 mmol/L   Glucose, Bld 106 (H) 65 - 99 mg/dL   BUN 19 6 - 20 mg/dL   Creatinine, Ser 0.94 0.44 - 1.00 mg/dL   Calcium 9.3 8.9 - 10.3 mg/dL   GFR calc non Af Amer 57 (L) >60 mL/min   GFR calc Af Amer >60 >60 mL/min    Comment: (NOTE) The eGFR has been calculated using the CKD EPI equation. This calculation has not been validated in all clinical situations. eGFR's persistently <60 mL/min signify possible Chronic Kidney Disease.    Anion gap 8 5 - 15  Troponin I     Status: Abnormal   Collection Time: 04/22/16  6:05 AM  Result Value Ref Range   Troponin I 0.47 (HH) <0.03 ng/mL    Comment: CRITICAL VALUE NOTED.  VALUE IS CONSISTENT WITH PREVIOUSLY REPORTED AND CALLED VALUE.  CBC     Status: Abnormal   Collection Time: 04/23/16  3:20 AM  Result Value Ref Range   WBC 7.9 4.0 - 10.5 K/uL   RBC 3.83 (L) 3.87 - 5.11 MIL/uL   Hemoglobin 11.1 (L) 12.0 - 15.0 g/dL   HCT 34.3 (L) 36.0 - 46.0 %   MCV 89.6 78.0 - 100.0 fL   MCH 29.0 26.0 - 34.0 pg   MCHC 32.4 30.0 - 36.0 g/dL   RDW 14.6 11.5 - 15.5 %   Platelets 142 (L) 150 - 400 K/uL    Imaging: Dg Chest 2 View  Result Date: 04/21/2016 CLINICAL DATA:  Chest pain EXAM: CHEST  2 VIEW COMPARISON:  06/17/2015 FINDINGS: The heart size and mediastinal contours are within normal limits. Both  lungs are clear. The visualized skeletal structures are unremarkable. IMPRESSION: No active cardiopulmonary disease. Electronically Signed   By: Inez Catalina M.D.   On: 04/21/2016 11:48   Mr Jeri Cos WC Contrast  Result Date: 04/21/2016 CLINICAL DATA:  Initial evaluation for ring aneurysm. Patient with history of an STEMI, to undergo cardiac catheterization. EXAM: MRI HEAD WITHOUT AND WITH CONTRAST TECHNIQUE: Multiplanar, multiecho pulse sequences of the brain and surrounding structures were obtained without and with intravenous  contrast. CONTRAST:  35m MULTIHANCE GADOBENATE DIMEGLUMINE 529 MG/ML IV SOLN COMPARISON:  Prior MRI from 01/22/2010. FINDINGS: Brain: Mild diffuse prominence of the CSF containing spaces is compatible with generalized cerebral atrophy, stable. Mild patchy T2/FLAIR hyperintensity within the periventricular, deep, and subcortical white matter both cerebral hemispheres is present, most consistent with chronic micro vessel ischemic disease, mild for age. Remote lacunar infarct present within the left basal ganglia/ corona radiata, stable. No abnormal foci of restricted diffusion to suggest acute or subacute ischemia. Gray-white matter differentiation maintained. No acute or chronic intracranial hemorrhage. No mass lesion, midline shift, or mass effect. No hydrocephalus. No extra-axial fluid collection. Major dural sinuses are patent. No abnormal enhancement. Pituitary gland within normal limits. Vascular: Major intracranial vascular flow voids are maintained. Previously identified left supraclinoid aneurysm ease partially visualized, grossly similar, however, not well evaluated on this exam. Dedicated imaging with MRA is recommended. Skull and upper cervical spine: Craniocervical junction within normal limits. Visualized upper cervical spine unremarkable without significant degenerative changes are stenosis. Bone marrow signal intensity within normal limits. No scalp soft tissue  abnormality. Sinuses/Orbits: Globes and orbital soft tissues are within normal limits. Patient is status post lens extraction bilaterally. Mild scattered mucosal thickening within the ethmoidal air cells and maxillary sinuses. No air-fluid level to suggest active sinus infection. No mastoid effusion. Inner ear structures grossly normal. Other: No other significant finding. IMPRESSION: 1. No acute intracranial process. 2. Remote lacunar infarct within the left basal ganglia/corona radiata, stable. 3. Mild chronic microvascular ischemic disease, stable. 4. 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 Further evaluation with dedicated MRA is recommended for direct comparative purposes. Electronically Signed   By: BJeannine BogaM.D.   On: 04/21/2016 15:50    Assessment:  1. Active Problems: 2.   Hypertension 3.   Dyslipidemia 4.   CAD in native artery 5.   ACS (acute coronary syndrome) ([place] [facility].   NSTEMI (non-ST elevated myocardial infarction) ([place] 7.   Plan:  1. Feels well today - transient hypotension overnight. Decrease losartan to 50 mg daily. Continue aspirin, imdur, toprol and atorvastatin. Can go with PRN lasix 20 mg. TOC follow-up in 5-7 days with midlevel and then ultimately with Dr. TRadford Pax Echo in 1 month as outpatient to look for LV recovery.  Time Spent Directly with Patient:  15 minutes  Length of Stay:  LOS: 2 days   KPixie Casino MD, FStone Oak Surgery CenterAttending Cardiologist CSan Jacinto10/06/2016, 8:49 AM

## 2016-04-23 NOTE — Discharge Summary (Signed)
Patient ID: [name],  MRN: [id number], DOB/AGE: 10/29/1940 75 y.o.  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 Venetia Maxon). She presented to [facility] 04/21/16 with chest pain and elevated troponin. She was recently awaiting MR of the brain to evaluate for brain aneurysm progression before committing to definitive cardiac cath and possible antiplatelet therapy if required. Initial troponin was 0.29 and her EKG showed new ST-T changes compared to prior. Ronie Spies spoke with Dr. Bevely Palmer, who was on call for [facility] after the pt had an MRI. He cleared the patient for cardiac catheterization and use of DAPT if necessary since her aneurysm is not acutely ruptured and there was an acute coronary syndrome occurring. Cath done 04/21/16 revealed a 75% DX1 lesion, moderate LVD 35-45% in pattern of Takotsubo CM, and a 55 mmHg LVOT gradient. She was kept overnight and medications adjusted. She was seen by Dr Rennis Golden on the morning of the 12th and he feels she is stable for discharge. She'll need a TOC OV in 7 days and a f/u echo in one month.   Discharge Vitals:  Blood pressure (!) 132/57, pulse 82, temperature 98 F (36.7 C), temperature source Oral, resp. rate (!) 25, height 5\' 2"  (1.575 m), weight 128 lb 15.5 oz (58.5 kg), SpO2 94 %.    Labs: Results for orders placed or performed during the hospital encounter of 04/21/16 (from the past 24 hour(s))  CBC      Status: Abnormal   Collection Time: 04/23/16  3:20 AM  Result Value Ref Range   WBC 7.9 4.0 - 10.5 K/uL   RBC 3.83 (L) 3.87 - 5.11 MIL/uL   Hemoglobin 11.1 (L) 12.0 - 15.0 g/dL   HCT 78.2 (L) 95.6 - 21.3 %   MCV 89.6 78.0 - 100.0 fL   MCH 29.0 26.0 - 34.0 pg   MCHC 32.4 30.0 - 36.0 g/dL   RDW 08.6 57.8 - 46.9 %   Platelets 142 (L) 150 - 400 K/uL    Disposition:  Follow-up Information    Dayna N Dunn, PA-C .   Specialties:  Cardiology, Radiology Why:  Office will contact you Contact information: [address] [place] [address] [phone]           Discharge Medications:    Medication List    STOP taking these medications   aspirin 325 MG tablet Replaced by:  aspirin 81 MG EC tablet   losartan-hydrochlorothiazide 100-25 MG tablet Commonly known as:  HYZAAR     TAKE these medications   acetaminophen 325 MG tablet Commonly known as:  TYLENOL Take 2 tablets (650 mg total) by mouth every 4 (four) hours as needed for headache or mild pain.   aspirin 81 MG EC tablet Take 1 tablet (81 mg total) by mouth daily. Replaces:  aspirin 325 MG tablet   atorvastatin 80 MG tablet Commonly known as:  LIPITOR Take 80 mg by mouth daily.   calcium carbonate 600 MG Tabs tablet Commonly known as:  OS-CAL Take 600 mg by mouth daily with breakfast.   FISH OIL PO Take 1 capsule by mouth daily.   furosemide 20 MG tablet Commonly known as:  LASIX Take 1 tablet (20 mg total) by mouth daily as needed for fluid or edema.   gabapentin 300 MG capsule Commonly known as:  NEURONTIN Take 300 mg by mouth 3 (three) times daily.   isosorbide mononitrate 30 MG 24 hr tablet Commonly known as:  IMDUR Take 0.5 tablets (15 mg total) by mouth daily.   losartan 50 MG tablet Commonly known as:  COZAAR Take 1 tablet (50 mg total) by mouth daily.   meloxicam 7.5 MG tablet Commonly known as:  MOBIC Take 7.5 mg by mouth as needed for pain (once or twice a day as  needed for shoulder/neck pain).   metoprolol succinate 25 MG 24 hr tablet Commonly known as:  TOPROL-XL Take 1 tablet (25 mg total) by mouth daily.   multivitamin tablet Take 1 tablet by mouth daily.   pantoprazole 40 MG tablet Commonly known as:  PROTONIX Take 1 tablet (40 mg total) by mouth daily. What changed:  when to take this  reasons to take this  additional instructions   QVAR 80 MCG/ACT inhaler Generic drug:  beclomethasone Inhale 1 puff into the lungs as needed. For shortness of breath.   SALINE NASAL SPRAY NA Place into the nose. 2 sprays per nostril every 3-4 hours for upper respatory maintenance   vitamin B-12 1000 MCG tablet Commonly known as:  CYANOCOBALAMIN Take 1,000 mcg by mouth daily.   vitamin C 100 MG tablet Take 100 mg by mouth daily.        Duration of Discharge Encounter: Greater than 30 minutes including physician time.  Jolene ProvostSigned,   PA-C 04/23/2076 12:14 PM

## 2016-04-23 NOTE — Discharge Instructions (Signed)
Angiogram  An angiogram, also called angiography, is a procedure used to look at the blood vessels. In this procedure, dye is injected through a long, thin tube (catheter) into an artery. X-rays are then taken. The X-rays will show if there is a blockage or problem in a blood vessel.   LET YOUR HEALTH CARE PROVIDER KNOW ABOUT:  · Any allergies you have, including allergies to shellfish or contrast dye.    · All medicines you are taking, including vitamins, herbs, eye drops, creams, and over-the-counter medicines.    · Previous problems you or members of your family have had with the use of anesthetics.    · Any blood disorders you have.    · Previous surgeries you have had.  · Any previous kidney problems or failure you have had.   · Medical conditions you have.    · Possibility of pregnancy, if this applies.  RISKS AND COMPLICATIONS  Generally, an angiogram is a safe procedure. However, as with any procedure, problems can occur. Possible problems include:  · Injury to the blood vessels, including rupture or bleeding.  · Infection or bruising at the catheter site.  · Allergic reaction to the dye or contrast used.  · Kidney damage from the dye or contrast used.  · Blood clots that can lead to a stroke or heart attack.  BEFORE THE PROCEDURE  · Do not eat or drink after midnight on the night before the procedure, or as directed by your health care provider.    · Ask your health care provider if you may drink enough water to take any needed medicines the morning of the procedure.    PROCEDURE  · You may be given a medicine to help you relax (sedative) before and during the procedure. This medicine is given through an IV access tube that is inserted into one of your veins.    · The area where the catheter will be inserted will be washed and shaved. This is usually done in the groin but may be done in the fold of your arm (near your elbow) or in the wrist.  · A medicine will be given to numb the area where the catheter  will be inserted (local anesthetic).  · The catheter will be inserted with a guide wire into an artery. The catheter is guided by using a type of X-ray (fluoroscopy) to the blood vessel being examined.    · Dye is then injected into the catheter, and X-rays are taken. The dye helps to show where any narrowing or blockages are located.    AFTER THE PROCEDURE   · If the procedure is done through the leg, you will be kept in bed lying flat for several hours. You will be instructed to not bend or cross your legs.  · The insertion site will be checked frequently.  · The pulse in your feet or wrist will be checked frequently.  · Additional blood tests, X-rays, and electrocardiography may be done.    · You may need to stay in the hospital overnight for observation.       This information is not intended to replace advice given to you by your health care provider. Make sure you discuss any questions you have with your health care provider.     Document Released: 04/08/2005 Document Revised: 07/20/2014 Document Reviewed: 11/30/2012  Elsevier Interactive Patient Education ©2016 [employer]

## 2016-04-27 ENCOUNTER — Ambulatory Visit (HOSPITAL_COMMUNITY): Payer: Medicare Other

## 2016-05-01 ENCOUNTER — Telehealth: Payer: Self-pay | Admitting: Cardiology

## 2016-05-01 NOTE — Telephone Encounter (Signed)
[name] is calling because she was told to call if her blood pressure is still high.  When she woke  up this morning it was 147/60 and when she checked a few minutes ago it was 143/60. On yesterday it was 135/60. It was up and down and it would not go any lower than the 135/60   Thanks

## 2016-05-01 NOTE — Telephone Encounter (Signed)
Pt states BP normally 108-124/60's.  Pt has had a slight HA the last two days.  Has occasional dizziness but not often.  Denies SOB, CP or other cardiac symptoms.  BP this morning was 147/60 and then shortly before calling it was 143/60.  Pt has not taken her medications this morning.  Advised pt to take medications and check BP 1-2 hrs after taking medications.  Advised pt to monitor BP x 1 week and call our office with recordings.  Also advised I would send message to Dr. Mayford Knifeurner to see if she had further recommendations.  Pt verbalized understanding and was in agreement with this plan.

## 2016-05-04 ENCOUNTER — Telehealth: Payer: Self-pay | Admitting: Cardiology

## 2016-05-04 NOTE — Telephone Encounter (Signed)
Patient reports higher blood pressure and headaches since discharge from the hospital.  She spoke with a nurse Friday (see 10/20 phone note), but she would like medication changes since she was told in the hospital that her BP should be less than 130.  This AM, 2 hours after taking her medications, her BP was 154/62. Yesterday, her blood pressure ranged from 140/60 to 154/62. She is asymptomatic other than headaches.  [facility]

## 2016-05-04 NOTE — Telephone Encounter (Signed)
Patient does not take Losartan alone.  Instructed her to start taking Losartan by itself tomorrow at lunch, check BP 2 hours after, and call with results on Wednesday. She understands medication may be increased at that time depending on readings.   Patient states she has no neurologist and was told in the hospital her "brain scan looked good."  To Ronie Spiesayna Dunn for clarification.

## 2016-05-04 NOTE — Telephone Encounter (Signed)
[name] states she spoke with Ronie Spiesayna Dunn nurse on Fri Oct 20 and was prescribe new medication for her blood pressure. She was told she should have reading under 130 because of her heart condition but since taking medication 140 and yesterday morning 155/51 yesterday evening 151/32 this morning 154/55.

## 2016-05-04 NOTE — Telephone Encounter (Signed)
Agree re: losartan. She does in fact have a neurosurgeon - she sees Dr. Venetia MaxonStern. She was supposed to have an outpatient brain MRI on 10/12 to f/u her brain aneurysm but she ended up getting admitted before then, so a scan was done as inpatient. However, it was inconclusive so she needs to follow up with Dr. Venetia MaxonStern to review what further workup needs to be done. Can you please call Dr. Fredrich BirksStern's office and speak with his nurse to find out when she should follow up next?  Thanks! 

## 2016-05-04 NOTE — Telephone Encounter (Signed)
Please make sure she is taking losartan by itself (50mg  daily - this was recently prescribed in the hospital in place of combo losartan/HCTZ due to low BP). If she confirms that she is, go ahead and increase to 1.5 tablets daily and follow BP.  If it is still elevated over 140 systolic on Wednesday 10/25, may increase to 2 tablets (100mg ) and continue to follow. If BP falls too low <100 systolic, call our office. Please also make sure she has follow up with her neurosurgeon to go over further eval of her brain aneurysm.   PA-C

## 2016-05-04 NOTE — Telephone Encounter (Signed)
Spoke with Lupita Leashonna at Dr. Fredrich BirksStern's office. She states she will have Dr. Venetia MaxonStern review films when he is in the office tomorrow and they will call the patient with a plan.  Informed patient of plan and she understands Dr. Fredrich BirksStern's office will call her sometime this week.

## 2016-05-05 ENCOUNTER — Ambulatory Visit: Payer: Medicare Other | Admitting: Physician Assistant

## 2016-05-06 ENCOUNTER — Telehealth: Payer: Self-pay | Admitting: Cardiology

## 2016-05-06 MED ORDER — LOSARTAN POTASSIUM 50 MG PO TABS: 75.0000 mg | ORAL_TABLET | Freq: Every day | ORAL | 3 refills | Status: DC

## 2016-05-06 NOTE — Telephone Encounter (Signed)
New Message  Pt c/o BP issue: STAT if pt c/o blurred vision, one-sided weakness or slurred speech  1. What are your last 5 BP readings?  Tues.-132/54 @ 630am Tues.-134/58 @ 1230pm Tues.-141/55 @ bedtime Wed. 158/64 @ AM Wed. 142/60 @ PM  2. Are you having any other symptoms (ex. Dizziness, headache, blurred vision, passed out)? No  3. What is your BP issue? Just calling to provided BP readings

## 2016-05-06 NOTE — Telephone Encounter (Signed)
Will forward this to Karsten FellsKaty Kemp since she helped with this patient the other day (see other phone note).  When she called in the other day, she hadn't been taking her losartan as instructed on her AVS. We had her start losartan 50mg  daily as instructed on her recent discharge paperwork.   If she confirms that she is taking this, go ahead and increase to 1.5 tablets daily and continue to follow BP. Call if regularly running >135 on top number.  If BP falls too low <100 systolic, call our office.  Can you see if we can move up her f/u appt to earlier in the week next week so we can personally follow up BP?  Dayna Dunn PA-C

## 2016-05-06 NOTE — Telephone Encounter (Signed)
Will forward this to Karsten FellsKaty Kemp since she helped with this patient the other day (see other phone note).  Patient is not always reliable about her medications. When she called in the other day, she hadn't been taking her losartan as instructed on her AVS. We had her start losartan 50mg  daily as instructed on her recent discharge paperwork.   If she confirms that she is taking this, go ahead and increase to 1.5 tablets daily and continue to follow BP. Call if regularly  running >135 on  If BP falls too low <100 systolic, call our office.  Dayna Dunn PA-C

## 2016-05-06 NOTE — Telephone Encounter (Signed)
Confirmed with patient she has been taking Losartan 50 mg daily. She takes this alone at lunch. Instructed patient to INCREASE LOSARTAN to 1.5 tablets (75 mg total) daily. She will continue to follow BP and keep a log. She will call if her BP remains over 135 or drops low. Rescheduled OV to 10/31 with [facility]ittainy [facility] She is unable to get a ride to see Dayna on Monday.  Also confirmed with patient that Dr. Fredrich BirksStern's office (Neuro) was called. They know to pull her films and she understands they will call her with recommendations. If she does not hear from them by the end of the week, she will call them.

## 2016-05-08 ENCOUNTER — Ambulatory Visit: Payer: Medicare Other | Admitting: Physician Assistant

## 2016-05-12 ENCOUNTER — Ambulatory Visit (INDEPENDENT_AMBULATORY_CARE_PROVIDER_SITE_OTHER): Payer: Medicare Other | Admitting: Cardiology

## 2016-05-12 ENCOUNTER — Ambulatory Visit (HOSPITAL_COMMUNITY): Payer: Medicare Other | Attending: Cardiology

## 2016-05-12 ENCOUNTER — Encounter: Payer: Self-pay | Admitting: Cardiology

## 2016-05-12 VITALS — BP 150/62 | HR 59 | Ht 62.0 in | Wt 134.2 lb

## 2016-05-12 DIAGNOSIS — R072 Precordial pain: Secondary | ICD-10-CM

## 2016-05-12 DIAGNOSIS — Z5181 Encounter for therapeutic drug level monitoring: Secondary | ICD-10-CM

## 2016-05-12 DIAGNOSIS — I5181 Takotsubo syndrome: Secondary | ICD-10-CM | POA: Diagnosis not present

## 2016-05-12 DIAGNOSIS — I5022 Chronic systolic (congestive) heart failure: Secondary | ICD-10-CM | POA: Diagnosis not present

## 2016-05-12 LAB — ECHOCARDIOGRAM COMPLETE
Height: 62 in
Weight: 2147.2 oz

## 2016-05-12 MED ORDER — LOSARTAN POTASSIUM 100 MG PO TABS: 100.0000 mg | ORAL_TABLET | Freq: Every day | ORAL | 1 refills | Status: DC

## 2016-05-12 NOTE — Progress Notes (Signed)
05/12/2016 Cindy NgShirley L Fields   [birth year]  [id number]  Primary Physician Astrid DivineGRIFFIN,[name] COLLINS, MD Primary Cardiologist: Dr. 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 Venetia MaxonStern). [facility] with chest pain and elevated troponin. She was recently awaiting MR of the brain to evaluate for brain aneurysm progression before committing to definitive cardiac cath and possible antiplatelet therapy if required. Initial troponin was 0.29 and her EKG showed new ST-T changes compared to prior. Ronie Spiesayna Dunn spoke with Dr. Bevely Palmeritty, who [facility] after the pt had an MRI. He cleared the patient for cardiac catheterization and use of DAPT if necessary since her aneurysm is not acutely ruptured and there was an acute coronary syndrome occurring. Cath done 04/21/16 revealed a 75% DX1 lesion, moderate LVD 35-45% in pattern of Takotsubo CM, and a 55 mmHg LVOT gradient. She was kept overnight and medications adjusted. She was discharged home in stable condition on an ARB and BB. Discharge date was 04/23/16. Plan is for repeat echo in 1 month.   Since discharge, she has been having issues with elevated BP. She called the office and she was instructed to increase her losartan to 75 mg daily.   She presents to clinic today for f/u. Her BP is 150/62. HR is 59. She brings with her readings of home BP. She has ranged in the 140s-150s. She has avoided salt and no excess caffeine. She has been fully complaint with meds. She denies any dyspnea. No LEE, orthopnea nor PND. No recurrent CP. Her right radial cath site is stable.    Current Meds  Medication Sig  . acetaminophen (TYLENOL) 325 MG tablet Take 2 tablets (650 mg total) by mouth every 4 (four) hours as  needed for headache or mild pain.  . Ascorbic Acid (VITAMIN C) 100 MG tablet Take 100 mg by mouth daily.  [name]. aspirin EC 81 MG EC tablet Take 1 tablet (81 mg total) by mouth daily.  [name]. atorvastatin (LIPITOR) 80 MG tablet Take 80 mg by mouth daily.  . calcium carbonate (OS-CAL) 600 MG TABS tablet Take 600 mg by mouth daily with breakfast.  . furosemide (LASIX) 20 MG tablet Take 1 tablet (20 mg total) by mouth daily as needed for fluid or edema.  . gabapentin (NEURONTIN) 300 MG capsule Take 300 mg by mouth 3 (three) times daily.  . isosorbide mononitrate (IMDUR) 30 MG 24 hr tablet Take 0.5 tablets (15 mg total) by mouth daily.  [name]. losartan (COZAAR) 100 MG tablet Take 1 tablet (100 mg total) by mouth daily.  . meloxicam (MOBIC) 7.5 MG tablet Take 7.5 mg by mouth as needed for pain (once or twice a day as needed for shoulder/neck pain).  . metoprolol succinate (TOPROL-XL) 25 MG 24 hr tablet Take 1 tablet (25 mg total) by mouth daily.  . Multiple Vitamin (MULTIVITAMIN) tablet Take 1 tablet by mouth daily.  . Omega-3 Fatty Acids (FISH OIL PO) Take 1 capsule by mouth daily.   . pantoprazole (PROTONIX) 40 MG tablet Take 1 tablet (40 mg total) by mouth daily.  [name]. QVAR 80 MCG/ACT inhaler Inhale 1 puff into the lungs as needed. For shortness of breath.  [name]. SALINE NASAL SPRAY NA Place into the nose. 2 sprays per nostril every 3-4  hours for upper respatory maintenance  . vitamin B-12 (CYANOCOBALAMIN) 1000 MCG tablet Take 1,000 mcg by mouth daily.  . [DISCONTINUED] losartan (COZAAR) 50 MG tablet Take 1.5 tablets (75 mg total) by mouth daily.   Allergies  Allergen Reactions  . Codeine     Itching and rash  . Penicillins     Itching and rash   Past Medical History:  Diagnosis Date  . ACS (acute coronary syndrome) (HCC)   . Atypical chest pain    negative cardiolyte 09/2007 w Dr Mayford Knife  . CAD in native artery    a. Cor CT 02/2016 - poor quality due to motion/poor filling of coronary arteries with tortuosity,  2 isolated foci in LM (nonobstructive), calcium score 23rd percentile, normal left dominant coronary arteries with noted motion artifact and poor distal vessel visualization.  . Dyslipidemia   . GERD (gastroesophageal reflux disease)   . Hypercholesteremia   . Hypertension   . NSTEMI (non-ST elevated myocardial infarction) (HCC) 04/2016  . PAC (premature atrial contraction)    Event monitors 10/2013 and 08/2014 showed NSR, ST, PACs, PVCs.   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 Posterior communicating artery aneurysm    followed by Dr Venetia Maxon  . PVC's (premature ventricular contractions)    Event monitors 10/2013 and 08/2014 showed NSR, ST, PACs, PVCs.    Family History  Problem Relation Age of Onset  . Heart attack Mother   . Heart disease Mother   . Heart attack Brother   . Heart disease Brother   . Heart attack Sister   . Heart disease Sister    Past Surgical History:  Procedure Laterality Date  . ABDOMINAL HYSTERECTOMY    . 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 CARDIAC CATHETERIZATION N/A 04/21/2016   Procedure: Left Heart Cath and Coronary Angiography;  Surgeon: Corky Crafts, MD;  Location: [facility] INVASIVE CV LAB;  Service: Cardiovascular;  Laterality: N/A;  . IR GENERIC HISTORICAL  02/13/2016   IR RADIOLOGY PERIPHERAL GUIDED IV START 02/13/2016 Berdine Dance, MD [facility]INTER[facility]  . IR GENERIC HISTORICAL  02/13/2016   IR US GUIDE VASC ACCESS LEFT 02/13/2016 Berdine Dance, MD [facility]   Social History   Social History  . Marital status: Single    Spouse name: N/A  . Number of children: N/A  . Years of education: N/A   Occupational History  . Not on file.   Social History Main Topics  . Smoking status: Former Smoker    Quit date: 07/13/2004  . Smokeless tobacco: Never Used  . Alcohol use Yes     Comment: ocassionally   . Drug use: No  . Sexual activity: Not on file   Other Topics  Concern  . Not on file   Social History Narrative  . No narrative on file     Review of Systems: General: negative for chills, fever, night sweats or weight changes.  Cardiovascular: negative for chest pain, dyspnea on exertion, edema, orthopnea, palpitations, paroxysmal nocturnal dyspnea or shortness of breath Dermatological: negative for rash Respiratory: negative for cough or wheezing Urologic: negative for hematuria Abdominal: negative for nausea, vomiting, diarrhea, bright red blood per rectum, melena, or hematemesis Neurologic: negative for visual changes, syncope, or dizziness All other systems reviewed and are otherwise negative except as noted above.   Physical Exam:  Blood pressure (!) 150/62, pulse (!) 59, height  5\' 2"  (1.575 m), weight 134 lb 3.2 oz (60.9 kg).  General appearance: alert, cooperative and no distress Neck: no carotid bruit and no JVD Lungs: clear to auscultation bilaterally Heart: regular rate and rhythm, S1, S2 normal, no murmur, click, rub or gallop Extremities: no LEE on the left. 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 No rashes or lesions Neurologic: Grossly normal  EKG not performed   ASSESSMENT AND PLAN:   1. Systolic HF/ ?: moderate LVD 35-45% in pattern of Takotsubo CM, and a 55 mmHg LVOT gradient. She is on an ARB,  BB and diuretic. Asymptomatic. No s/s of acute CHF. No dyspnea. She is euvolemic on physical exam. We will check a f/u BMP given increasing doses of her ARB and change in diuretic (HCTZ>>Lasix). Continue low sodium diet. We discussed weight monitoring. Repeat 2D echo in another 3 weeks.   2. CAD: recent LHC showed mild-moderate, nonosbstructive CAD with a 75% stenosed 1st diagonal branch and 25% mid LCx, treated medically. She denies any recurrent CP. Continue ASA, statin, BB, ARB and Imdur.   3. HTN: BP remains moderately elevated. She is having mild HAs with Imdur,  thus will not increase dose at this time. Her HR prohibits further increase of her BB. We will further increase her Losartan to 100 mg daily. Check f/u BMP today.   PLAN  F/u in 4 weeks after f/u echo.   Brittainy Simmons PA-C 05/12/2016 3:20 PM

## 2016-05-12 NOTE — Patient Instructions (Signed)
Medication Instructions:   START TAKING LOSARTAN 100 MG ONCE A DAY   If you need a refill on your cardiac medications before your next appointment, please call your pharmacy.  Labwork: BMET TODAY    Testing/Procedures: Your physician has requested that you have an echocardiogram. Echocardiography is a painless test that uses sound waves to create images of your heart. It provides your doctor with information about the size and shape of your heart and how well your heart's chambers and valves are working. This procedure takes approximately one hour. There are no restrictions for this procedure.    Follow-Up: IN 4 WEEKS  WITH DUNN ,SIMMONS, OR TURNER    Any Other Special Instructions Will Be Listed Below (If Applicable).

## 2016-05-13 LAB — BASIC METABOLIC PANEL
BUN: 15 mg/dL (ref 7–25)
CHLORIDE: 106 mmol/L (ref 98–110)
CO2: 24 mmol/L (ref 20–31)
CREATININE: 0.9 mg/dL (ref 0.60–0.93)
Calcium: 9.8 mg/dL (ref 8.6–10.4)
Glucose, Bld: 106 mg/dL — ABNORMAL HIGH (ref 65–99)
POTASSIUM: 4 mmol/L (ref 3.5–5.3)
Sodium: 141 mmol/L (ref 135–146)

## 2016-05-15 ENCOUNTER — Ambulatory Visit: Payer: Medicare Other | Admitting: Physician Assistant

## 2016-05-15 ENCOUNTER — Telehealth: Payer: Self-pay | Admitting: Cardiology

## 2016-05-15 NOTE — Telephone Encounter (Signed)
Pt aware of her lab results and she verbalized understanding. 

## 2016-05-15 NOTE — Telephone Encounter (Signed)
[name] is returning a call . Please call... Thanks

## 2016-05-19 ENCOUNTER — Ambulatory Visit: Payer: Medicare Other | Attending: Family Medicine | Admitting: Physical Therapy

## 2016-05-19 ENCOUNTER — Encounter: Payer: Self-pay | Admitting: Physical Therapy

## 2016-05-19 DIAGNOSIS — M6281 Muscle weakness (generalized): Secondary | ICD-10-CM

## 2016-05-19 DIAGNOSIS — R2681 Unsteadiness on feet: Secondary | ICD-10-CM | POA: Diagnosis not present

## 2016-05-19 DIAGNOSIS — R2689 Other abnormalities of gait and mobility: Secondary | ICD-10-CM | POA: Insufficient documentation

## 2016-05-19 DIAGNOSIS — M25651 Stiffness of right hip, not elsewhere classified: Secondary | ICD-10-CM | POA: Insufficient documentation

## 2016-05-20 NOTE — Therapy (Signed)
[facility] [address] [place], [place], [address] Phone: [phone]   Fax:  [phone]  Physical Therapy Evaluation  Patient Details  Name: [name] MRN: [id number] Date of Birth: 19-Mar-1941 Referring Provider: Maurice Small, MD  Encounter Date: 05/19/2016      PT End of Session - 05/19/16 1445    Visit Number 1   Number of Visits 18   Date for PT Re-Evaluation 07/17/16   Authorization Type Medicare G-Code & progress note   PT Start Time 1340   PT Stop Time 1440   PT Time Calculation (min) 60 min   Equipment Utilized During Treatment Gait belt   Activity Tolerance Patient tolerated treatment well   Behavior During Therapy [facility] for tasks assessed/performed      Past Medical History:  Diagnosis Date  . ACS (acute coronary syndrome) (HCC)   . Atypical chest pain    negative cardiolyte 09/2007 w Dr Mayford Knife  . CAD in native artery    a. Cor CT 02/2016 - poor quality due to motion/poor filling of coronary arteries with tortuosity, 2 isolated foci in LM (nonobstructive), calcium score 23rd percentile, normal left dominant coronary arteries with noted motion artifact and poor distal vessel visualization.  . Dyslipidemia   . GERD (gastroesophageal reflux disease)   . Hypercholesteremia   . Hypertension   . NSTEMI (non-ST elevated myocardial infarction) (HCC) 04/2016  . PAC (premature atrial contraction)    Event monitors 10/2013 and 08/2014 showed NSR, ST, PACs, PVCs.   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 Posterior communicating artery aneurysm    followed by Dr Venetia Maxon  . PVC's (premature ventricular contractions)    Event monitors 10/2013 and 08/2014 showed NSR, ST, PACs, PVCs.     Past Surgical History:  Procedure Laterality Date  . ABDOMINAL HYSTERECTOMY    . 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 CARDIAC  CATHETERIZATION N/A 04/21/2016   Procedure: Left Heart Cath and Coronary Angiography;  Surgeon: Corky Crafts, MD;  [facility]: [facility] INVASIVE CV LAB;  Service: Cardiovascular;  Laterality: N/A;  . IR GENERIC HISTORICAL  02/13/2016   IR RADIOLOGY PERIPHERAL GUIDED IV START 02/13/2016 Berdine Dance, MD [facility]INTER[facility] RAD  . IR GENERIC HISTORICAL  02/13/2016   IR US GUIDE VASC ACCESS LEFT 02/13/2016 Berdine Dance, MD [facility]    There were no vitals filed for this visit.       Subjective Assessment - 05/19/16 1350    Subjective This 75yo female underwent a right Transfemoral Amputation in 2008 due to vascular issues. She was functioning with a prosthesis in community after undergoing therapy in 2008/9. She recently declined in mobility with ill-fitting prosthesis which led to deconditioning and fear of falling further decreasing her mobility. She recieved a new prosthesis on 04/28/2016 with new knee & foot design. Patient not wearing new prosthesis until she started PT. Patient presents today for evaluation.     Pertinent History PAD, HTN, systolic HF, CAD, MI, Brain aneurysm   Limitations Standing;Walking   Patient Stated Goals Walk in house with home with prosthesis with nothing or cane and community with walker   Currently in Pain? No/denies            [facility] PT Assessment - 05/19/16 1345      Assessment   Medical Diagnosis Right Transfemoral Amputation   Referring Provider  Maurice SmallElaine Griffin, MD   Onset Date/Surgical Date 04/28/16  New prosthesis delivery   Hand Dominance Right     Precautions   Precautions Fall     Restrictions   Weight Bearing Restrictions No     Balance Screen   Has the patient fallen in the past 6 months No   Has the patient had a decrease in activity level because of a fear of falling?  Yes   Is the patient reluctant to leave their home because of a fear of falling?  Yes     Home Environment   Living Environment Private residence   Living Arrangements Alone    Type of Home House   Home Access Ramped entrance   Home Layout One level   Home Equipment Walker - 2 wheels;Walker - 4 wheels;Cane - single point;Shower seat;Grab bars - tub/shower;Wheelchair - manual     Prior Function   Level of Independence Independent with community mobility with device;Independent with household mobility with device;Independent with gait   Vocation Retired   Furniture conservator/restorerLeisure volunteer at day care, exercising at [employer]     Observation/Other Time Warnerssessments   Focus on Therapeutic Outcomes (FOTO)  50.98 Functional Status   Activities of Balance Confidence Scale (ABC Scale)  35.0%   Fear Avoidance Belief Questionnaire (FABQ)  36 (11)     Posture/Postural Control   Posture/Postural Control Postural limitations   Postural Limitations Rounded Shoulders;Forward head;Flexed trunk;Weight shift left;Anterior pelvic tilt;Increased lumbar lordosis     ROM / Strength   AROM / PROM / Strength AROM;Strength     AROM   AROM Assessment Site Hip;Knee;Ankle   Right/Left Hip Right;Left   Right Hip Extension -27   Left Hip Extension -24   Right/Left Knee Left   Left Knee Extension -16   Right/Left Ankle Left   Left Ankle Plantar Flexion -8     Strength   Overall Strength Deficits   Overall Strength Comments BUEs grossly 4/5   Strength Assessment Site Hip;Knee;Ankle   Right/Left Hip Right;Left   Right Hip Flexion 4-/5   Right Hip Extension 3-/5   Right Hip ABduction 3-/5   Left Hip Flexion 4/5   Left Hip Extension 4-/5  tested in standing   Left Hip ABduction 4-/5  tested in standing   Right/Left Knee Left   Left Knee Flexion 3+/5   Left Knee Extension 4-/5   Right/Left Ankle Left   Left Ankle Dorsiflexion 4/5     Transfers   Transfers Sit to Stand;Stand to Sit   Sit to Stand 5: Supervision;With upper extremity assist;With armrests;From chair/3-in-1  requires touch to stabilize   Stand to Sit 5: Supervision;With upper extremity assist;With armrests;To chair/3-in-1  requires  touch on RW for stabilization     Ambulation/Gait   Ambulation/Gait Yes   Ambulation/Gait Assistance 5: Supervision;3: Mod assist  ModA cane, SBA RW   Ambulation Distance (Feet) 150 Feet  150' with RW & 7735' with cane   Assistive device Prosthesis;Rolling walker;Straight cane  cane with quad tip   Gait Pattern Step-to pattern;Decreased step length - left;Decreased stance time - right;Decreased stride length;Decreased hip/knee flexion - right;Decreased weight shift to right;Right hip hike;Right circumduction;Antalgic;Lateral hip instability;Trunk flexed;Abducted- right;Poor foot clearance - right   Ambulation Surface Indoor;Level   Gait velocity 0.95 ft/sec   Stairs Yes   Stairs Assistance 5: Supervision   Stair Management Technique Two rails;Step to pattern;Forwards   Number of Stairs 4   Ramp 4: Min assist  RW & prosthesis  Curb 4: Min assist  RW & prosthesis     Standardized Balance Assessment   Standardized Balance Assessment Berg Balance Test;Timed Up and Go Test     Berg Balance Test   Sit to Stand Needs minimal aid to stand or to stabilize  with RW stabilizes   Standing Unsupported Needs several tries to stand 30 seconds unsupported  with RW stands 2 min safely   Sitting with Back Unsupported but Feet Supported on Floor or Stool Able to sit safely and securely 2 minutes   Stand to Sit Sits independently, has uncontrolled descent  with RW supervision   Transfers Able to transfer safely, definite need of hands   Standing Unsupported with Eyes Closed Needs help to keep from falling  with RW stands 10 sec with supervision   Standing Ubsupported with Feet Together Needs help to attain position and unable to hold for 15 seconds   From Standing, Reach Forward with Outstretched Arm Loses balance while trying/requires external support  with RW reaches 2"   From Standing Position, Pick up Object from Floor Unable to try/needs assist to keep balance  with RW reaches 5cm from floor    From Standing Position, Turn to Look Behind Over each Shoulder Needs assist to keep from losing balance and falling  with RW turns to sideways only   Turn 360 Degrees Needs assistance while turning  with RW minA   Standing Unsupported, Alternately Place Feet on Step/Stool Needs assistance to keep from falling or unable to try  with RW minA   Standing Unsupported, One Foot in [employer] balance while stepping or standing  with RW able to take small step and hold 15 sec   Standing on One Leg Unable to try or needs assist to prevent fall  with RW stands on LLE 3 sec   Total Score 10     Timed Up and Go Test   Normal TUG (seconds) 37.85  37.85sec with RW         05/19/16 1345  Prosthetics  Prosthetic Care Dependent with Residual limb care;Skin check;Prosthetic cleaning;Ply sock cleaning;Correct ply sock adjustment;Proper wear schedule/adjustment;Proper weight-bearing schedule/adjustment  Donning prosthesis  4  Doffing prosthesis  5  Current prosthetic wear tolerance (days/week)  new prosthesis not worn since delivery 24 days ago  Current prosthetic wear tolerance (#hours/day)  tolerated 1 hr wear today  Current prosthetic weight-bearing tolerance (hours/day)  tolerates standing with UE support with no pain or discomfort in limb. Circulation pain in LLE up to 7/10  Edema none  Residual limb condition  no open areas. Ranae Pila Adult PT Treatment/Exercise - 05/19/16 1345      Prosthetics   Education Provided Skin check;Residual limb care;Prosthetic cleaning;Correct ply sock adjustment;Proper Donning;Proper wear schedule/adjustment;Proper weight-bearing schedule/adjustment   Person(s) Educated Patient   Education Method Explanation;Verbal cues;Demonstration   Education Method Verbalized understanding;Returned demonstration;Tactile cues required;Verbal cues required;Needs further instruction                  PT Short Term Goals - 05/19/16  1800      PT SHORT TERM GOAL #1   Title Patient tolerates wear of new prosthesis daily >8 hrs total without pain or limb issues. (Target Date: 06/18/2016)   Time 1   Period Months   Status New     PT SHORT TERM GOAL #2   Title Patient verbalizes proper cleaning & demonstrates  proper donning of new prosthesis. (Target Date: 06/18/2016)   Time 1   Period Months   Status New     PT SHORT TERM GOAL #3   Title Patient ambulates 300' with RW & prosthesis with cues on prosthesis control. (Target Date: 06/18/2016)   Time 1   Period Months   Status New     PT SHORT TERM GOAL #4   Title Patient ambulates 53' with cane & prosthesis with minA. (Target Date: 06/18/2016)   Time 1   Period Months   Status New           PT Long Term Goals - 05/20/16 1618      PT LONG TERM GOAL #1   Title Patient verbalizes & demonstrates understanding of prosthetic care with new prosthesis to enable safe use. (Target Date: 07/17/2016)   Time 2   Period Months   Status New     PT LONG TERM GOAL #2   Title Patient tolerates wear of new prosthesis >90% of awake hours without skin issues to enable safe use throughout her day. (Target Date: 07/17/2016)   Time 2   Period Months   Status New     PT LONG TERM GOAL #3   Title Patient ambulates 400' with LRAD & new prosthesis modified independent for community mobility. (Target Date: 07/17/2016)   Time 2   Period Months   Status New     PT LONG TERM GOAL #4   Title Patient negotiates ramps, curbs & stairs with LRAD & new prosthesis modified independent for community access. (Target Date: 07/17/2016)   Time 2   Period Months   Status New     PT LONG TERM GOAL #5   Title Patient ambulates around furniture 100' with LRAD (single UE support) and new prosthesis carrying cup of water modified independent. (Target Date: 07/17/2016)   Time 2   Period Months   Status New     Additional Long Term Goals   Additional Long Term Goals Yes     PT LONG TERM GOAL #6    Title Berg Balance >24/56 to indicate lower fall risk. (Target Date: 07/17/2016)   Time 2   Period Months   Status New     PT LONG TERM GOAL #7   Title Timed Up & Go with LRAD (single UE support) & prosthesis <30sec to indicate lower fall risk. (Target Date: 07/17/2016)   Time 2   Period Months   Status New               Plan - 05/19/16 1633    Clinical Impression Statement This 75yo was functioning with a prosthesis at community level until her prosthesis became ill-fitting causing deconditioning. She is dependent in care & safe use of her new prosthesis limiting safe use. She is not wearing new prosthesis due to lack of understanding of changes which limits her function. She has high fall risk & dependency in ADLs noted by [facility] 10/56 & Timed Up-Go 37.85sec. Her gait is dependent with gait deviations limiting safety. Patient's condition is evolving and plan of care is moderate.    Rehab Potential Good   PT Frequency 2x / week   PT Duration Other (comment)  9 weeks (60 days)   PT Treatment/Interventions ADLs/Self Care Home Management;DME Instruction;Gait training;Stair training;Functional mobility training;Therapeutic activities;Therapeutic exercise;Balance training;Neuromuscular re-education;Patient/family education;Prosthetic Training   PT Next Visit Plan HEP for midline & balance, prosthetic gait with RW   Consulted and Agree with  Plan of Care Patient      Patient will benefit from skilled therapeutic intervention in order to improve the following deficits and impairments:  Abnormal gait, Decreased activity tolerance, Decreased balance, Decreased endurance, Decreased knowledge of use of DME, Decreased range of motion, Decreased strength, Postural dysfunction, Prosthetic Dependency  Visit Diagnosis: Unsteadiness on feet  Other abnormalities of gait and mobility  Muscle weakness (generalized)  Stiffness of right hip, not elsewhere classified      G-Codes - 05/19/16  1631    Functional Assessment Tool Used [name] MottsBerg Balance 10/56 & Timed Up & Go with RW 37.85sec   Functional Limitation Mobility: Walking and moving around   Mobility: Walking and Moving Around Current Status ([id number]) At least 80 percent but less than 100 percent impaired, limited or restricted   Mobility: Walking and Moving Around Goal Status [phone]([id number]) At least 40 percent but less than 60 percent impaired, limited or restricted       Problem List Patient Active Problem List   Diagnosis Date Noted  . Chronic systolic HF (heart failure) (HCC) 05/12/2016  . ACS (acute coronary syndrome) (HCC) 04/21/2016  . NSTEMI (non-ST elevated myocardial infarction) (HCC) 04/21/2016  . Brain aneurysm 04/13/2016  . Hypertension   . Dyslipidemia   . CAD in native artery   . Chest pain 01/29/2016  . PAC (premature atrial contraction) 08/14/2014  . PVC's (premature ventricular contractions) 08/14/2014  . Palpitations 11/02/2013  . Hypercholesteremia   . Essential hypertension, benign 11/01/2013  . Peripheral vascular disease, unspecified 11/01/2013    , PT, DPT 05/20/2016, 4:38 PM   [facility] [address] [place], [place], [address] Phone: [phone]   Fax:  [phone]  Name: [name] MRN: [id number] Date of Birth: 1941-05-05

## 2016-05-21 ENCOUNTER — Ambulatory Visit: Payer: Medicare Other | Admitting: Physical Therapy

## 2016-05-21 ENCOUNTER — Encounter: Payer: Self-pay | Admitting: Physical Therapy

## 2016-05-21 DIAGNOSIS — R2689 Other abnormalities of gait and mobility: Secondary | ICD-10-CM

## 2016-05-21 DIAGNOSIS — R2681 Unsteadiness on feet: Secondary | ICD-10-CM

## 2016-05-21 DIAGNOSIS — M6281 Muscle weakness (generalized): Secondary | ICD-10-CM

## 2016-05-21 DIAGNOSIS — M25651 Stiffness of right hip, not elsewhere classified: Secondary | ICD-10-CM

## 2016-05-21 NOTE — Therapy (Signed)
[facility] [address] [place], [place], [address] Phone: [phone]   Fax:  [phone]  Physical Therapy Treatment  Patient Details  Name: [name] MRN: [id number] Date of Birth: 03-23-1941 Referring Provider: Maurice SmallElaine Griffin, MD  Encounter Date: 05/21/2016      PT End of Session - 05/21/16 1601    Visit Number 2   Number of Visits 18   Date for PT Re-Evaluation 07/17/16   Authorization Type Medicare G-Code & progress note   PT Start Time 1403   PT Stop Time 1443   PT Time Calculation (min) 40 min   Equipment Utilized During Treatment Gait belt   Activity Tolerance Patient tolerated treatment well   Behavior [facility] for tasks assessed/performed      Past Medical History:  Diagnosis Date  . ACS (acute coronary syndrome) (HCC)   . Atypical chest pain    negative cardiolyte 09/2007 w Dr Mayford Knifeurner  . CAD in native artery    a. Cor CT 02/2016 - poor quality due to motion/poor filling of coronary arteries with tortuosity, 2 isolated foci in LM (nonobstructive), calcium score 23rd percentile, normal left dominant coronary arteries with noted motion artifact and poor distal vessel visualization.  . Dyslipidemia   . GERD (gastroesophageal reflux disease)   . Hypercholesteremia   . Hypertension   . NSTEMI (non-ST elevated myocardial infarction) (HCC) 04/2016  . PAC (premature atrial contraction)    Event monitors 10/2013 and 08/2014 showed NSR, ST, PACs, PVCs.   [name]. 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 Posterior communicating artery aneurysm    followed by Dr Venetia MaxonStern  . PVC's (premature ventricular contractions)    Event monitors 10/2013 and 08/2014 showed NSR, ST, PACs, PVCs.     Past Surgical History:  Procedure Laterality Date  . ABDOMINAL HYSTERECTOMY    . 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 CARDIAC  CATHETERIZATION N/A 04/21/2016   Procedure: Left Heart Cath and Coronary Angiography;  Surgeon: Corky CraftsJayadeep S Varanasi, MD;  [facility];  Service: Cardiovascular;  Laterality: N/A;  . IR GENERIC HISTORICAL  02/13/2016   IR RADIOLOGY PERIPHERAL GUIDED IV START 02/13/2016 Berdine DanceMichael Shick, MD [facility]-INTER[facility]  . IR GENERIC HISTORICAL  02/13/2016   IR US GUIDE VASC ACCESS LEFT 02/13/2016 Berdine DanceMichael Shick, MD [facility]    There were no vitals filed for this visit.      Subjective Assessment - 05/21/16 1406    Subjective Pt started walking in her home a little bit since last Tuesday; concerned that she will fall.   Pertinent History PAD, HTN, systolic HF, CAD, MI, Brain aneurysm   Limitations Standing;Walking   Patient Stated Goals Walk in house with home with prosthesis with nothing or cane and community with walker   Currently in Pain? Yes   Pain Score 4    Pain Location Foot   Pain Orientation Right   Pain Type Phantom pain   Pain Onset More than a month ago   Pain Frequency Intermittent                 Pre-gait training: Working on standing weight shifts and stepping to practise locking and unlocking LE; using walker  Min guard.        [facility]Ex[facility] - 05/21/16 0001      Ambulation/Gait  Ambulation/Gait Yes   Ambulation/Gait Assistance 4: Min guard to supervision   Ambulation Distance (Feet) 115 Feet + 11ft with functional turns   Assistive device Prosthesis;Rolling walker   Gait Pattern Step-to pattern;Decreased step length - left;Decreased stance time - right;Decreased stride length;Decreased hip/knee flexion - right;Decreased weight shift to right;Right hip hike;Right circumduction;Antalgic;Lateral hip instability;Trunk flexed;Abducted- right;Poor foot clearance - right   Ambulation Surface Level;Indoor     Prosthetics   Prosthetic Care Comments  Recommend pt increasing wear time to 1 hr 2x/day with 1-2 hrs between   Current prosthetic  wear tolerance (days/week)  only wore prosthesis 15 min due to discomfort of liner.   Education Provided Proper wear schedule/adjustment   Person(s) Educated Patient                PT Education - 05/21/16 1558    Education provided Yes   Education Details Updated wear schedule, how prosthesis locks and unlocks in standing and gait, safety at home and need for pt to keep cell phone on her when up.   Person(s) Educated Patient   Methods Explanation;Handout;Verbal cues;Demonstration;Tactile cues   Comprehension Verbalized understanding;Returned demonstration;Verbal cues required;Need further instruction          PT Short Term Goals - 05/19/16 1800      PT SHORT TERM GOAL #1   Title Patient tolerates wear of new prosthesis daily >8 hrs total without pain or limb issues. (Target Date: 06/18/2016)   Time 1   Period Months   Status New     PT SHORT TERM GOAL #2   Title Patient verbalizes proper cleaning & demonstrates proper donning of new prosthesis. (Target Date: 06/18/2016)   Time 1   Period Months   Status New     PT SHORT TERM GOAL #3   Title Patient ambulates 300' with RW & prosthesis with cues on prosthesis control. (Target Date: 06/18/2016)   Time 1   Period Months   Status New     PT SHORT TERM GOAL #4   Title Patient ambulates 36' with cane & prosthesis with minA. (Target Date: 06/18/2016)   Time 1   Period Months   Status New           PT Long Term Goals - 05/20/16 1618      PT LONG TERM GOAL #1   Title Patient verbalizes & demonstrates understanding of prosthetic care with new prosthesis to enable safe use. (Target Date: 07/17/2016)   Time 2   Period Months   Status New     PT LONG TERM GOAL #2   Title Patient tolerates wear of new prosthesis >90% of awake hours without skin issues to enable safe use throughout her day. (Target Date: 07/17/2016)   Time 2   Period Months   Status New     PT LONG TERM GOAL #3   Title Patient ambulates 400' with LRAD &  new prosthesis modified independent for community mobility. (Target Date: 07/17/2016)   Time 2   Period Months   Status New     PT LONG TERM GOAL #4   Title Patient negotiates ramps, curbs & stairs with LRAD & new prosthesis modified independent for community access. (Target Date: 07/17/2016)   Time 2   Period Months   Status New     PT LONG TERM GOAL #5   Title Patient ambulates around furniture 100' with LRAD (single UE support) and new prosthesis carrying cup of water modified independent. (Target Date: 07/17/2016)  Time 2   Period Months   Status New     Additional Long Term Goals   Additional Long Term Goals Yes     PT LONG TERM GOAL #6   Title Berg Balance >24/56 to indicate lower fall risk. (Target Date: 07/17/2016)   Time 2   Period Months   Status New     PT LONG TERM GOAL #7   Title Timed Up & Go with LRAD (single UE support) & prosthesis <30sec to indicate lower fall risk. (Target Date: 07/17/2016)   Time 2   Period Months   Status New               Plan - 05/21/16 1602    Clinical Impression Statement Pt had trouble understanding how to lock and unlock prosthesis in standing and during gait but pt seemed to progress understanding during session ambulating with greater speed and confidence by end of session.  Encouraged pt to progress wear time of prosthesis to increase tolerance to new liner.                                                                                                Rehab Potential Good   PT Frequency 2x / week   PT Duration Other (comment)  9 weeks (60 days)   PT Treatment/Interventions ADLs/Self Care Home Management;DME Instruction;Gait training;Stair training;Functional mobility training;Therapeutic activities;Therapeutic exercise;Balance training;Neuromuscular re-education;Patient/family education;Prosthetic Training   PT Next Visit Plan HEP for midline & balance, prosthetic gait with RW   Consulted and Agree with Plan of Care Patient       Patient will benefit from skilled therapeutic intervention in order to improve the following deficits and impairments:  Abnormal gait, Decreased activity tolerance, Decreased balance, Decreased endurance, Decreased knowledge of use of DME, Decreased range of motion, Decreased strength, Postural dysfunction, Prosthetic Dependency  Visit Diagnosis: Unsteadiness on feet  Other abnormalities of gait and mobility  Muscle weakness (generalized)  Stiffness of right hip, not elsewhere classified     Problem List Patient Active Problem List   Diagnosis Date Noted  . Chronic systolic HF (heart failure) (HCC) 05/12/2016  . ACS (acute coronary syndrome) (HCC) 04/21/2016  . NSTEMI (non-ST elevated myocardial infarction) (HCC) 04/21/2016  . Brain aneurysm 04/13/2016  . Hypertension   . Dyslipidemia   . CAD in native artery   . Chest pain 01/29/2016  . PAC (premature atrial contraction) 08/14/2014  . PVC's (premature ventricular contractions) 08/14/2014  . Palpitations 11/02/2013  . Hypercholesteremia   . Essential hypertension, benign 11/01/2013  . Peripheral vascular disease, unspecified 11/01/2013    Hortencia ConradiKarissa , PTA  05/21/16, 4:10 PM [facility] [address] [place], [place], [address] Phone: [phone]   Fax:  [phone]  Name: [name] MRN: [id number] Date of Birth: Jan 06, 1941

## 2016-05-21 NOTE — Patient Instructions (Signed)
Wear prosthesis 1hr 2x/day  Prosthesis in LOCKED- when your weight is ON it. Prosthesis in UNLOCKED - when your weight in OFF it

## 2016-05-22 NOTE — Telephone Encounter (Signed)
error 

## 2016-05-22 NOTE — Telephone Encounter (Signed)
This encounter is waiting to be closed by you.

## 2016-05-27 ENCOUNTER — Ambulatory Visit: Payer: Medicare Other | Admitting: Physical Therapy

## 2016-05-27 ENCOUNTER — Encounter: Payer: Self-pay | Admitting: Physical Therapy

## 2016-05-27 DIAGNOSIS — R2681 Unsteadiness on feet: Secondary | ICD-10-CM

## 2016-05-27 DIAGNOSIS — M6281 Muscle weakness (generalized): Secondary | ICD-10-CM

## 2016-05-27 DIAGNOSIS — R2689 Other abnormalities of gait and mobility: Secondary | ICD-10-CM

## 2016-05-27 DIAGNOSIS — M25651 Stiffness of right hip, not elsewhere classified: Secondary | ICD-10-CM

## 2016-05-27 NOTE — Therapy (Signed)
[facility] [address] [place], [place], [address] Phone: [phone]   Fax:  [phone]  Physical Therapy Treatment  Patient Details  Name: [name] MRN: [id number] Date of Birth: Nov 02, 1940 Referring Provider: Maurice Small, MD  Encounter Date: 05/27/2016      PT End of Session - 05/27/16 1558    Visit Number 3   Number of Visits 18   Date for PT Re-Evaluation 07/17/16   Authorization Type Medicare G-Code & progress note   PT Start Time 1405   PT Stop Time 1447   PT Time Calculation (min) 42 min   Equipment Utilized During Treatment Gait belt   Activity Tolerance Patient tolerated treatment well   Behavior During Therapy [facility] for tasks assessed/performed      Past Medical History:  Diagnosis Date  . ACS (acute coronary syndrome) (HCC)   . Atypical chest pain    negative cardiolyte 09/2007 w Dr Mayford Knife  . CAD in native artery    a. Cor CT 02/2016 - poor quality due to motion/poor filling of coronary arteries with tortuosity, 2 isolated foci in LM (nonobstructive), calcium score 23rd percentile, normal left dominant coronary arteries with noted motion artifact and poor distal vessel visualization.  . Dyslipidemia   . GERD (gastroesophageal reflux disease)   . Hypercholesteremia   . Hypertension   . NSTEMI (non-ST elevated myocardial infarction) (HCC) 04/2016  . PAC (premature atrial contraction)    Event monitors 10/2013 and 08/2014 showed NSR, ST, PACs, PVCs.   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 Posterior communicating artery aneurysm    followed by Dr Venetia Maxon  . PVC's (premature ventricular contractions)    Event monitors 10/2013 and 08/2014 showed NSR, ST, PACs, PVCs.     Past Surgical History:  Procedure Laterality Date  . ABDOMINAL HYSTERECTOMY    . 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 CARDIAC  CATHETERIZATION N/A 04/21/2016   Procedure: Left Heart Cath and Coronary Angiography;  Surgeon: Corky Crafts, MD;  Location: [facility] INVASIVE CV LAB;  Service: Cardiovascular;  Laterality: N/A;  . IR GENERIC HISTORICAL  02/13/2016   IR RADIOLOGY PERIPHERAL GUIDED IV START 02/13/2016 Berdine Dance, MD [facility]  . IR GENERIC HISTORICAL  02/13/2016   IR US GUIDE VASC ACCESS LEFT 02/13/2016 Berdine Dance, MD [facility]    There were no vitals filed for this visit.      Subjective Assessment - 05/27/16 1408    Subjective Pt reports wearing prothesis all day Sunday, but only 1 hr yesterday; pt isn't sure why.     Pertinent History PAD, HTN, systolic HF, CAD, MI, Brain aneurysm   Limitations Standing;Walking   Patient Stated Goals Walk in house with home with prosthesis with nothing or cane and community with walker   Currently in Pain? No/denies   Pain Onset More than a month ago                         [facility] Adult PT Treatment/Exercise - 05/27/16 0001      Ambulation/Gait   Ambulation/Gait Yes   Ambulation/Gait Assistance 5: Supervision   Ambulation/Gait Assistance Details Cues for step length, midline hip position vs rotated to the left, and for greater left kne flexion during terminal stance and initial swing phase.   Ambulation Distance (Feet) 115 Feet  +  200 ft   Assistive device Prosthesis;Rolling walker   Gait Pattern Decreased step length - left;Decreased stance time - right;Decreased stride length;Decreased hip/knee flexion - right;Decreased weight shift to right;Right hip hike;Right circumduction;Antalgic;Lateral hip instability;Trunk flexed;Abducted- right;Poor foot clearance - right;Step-through pattern   Ambulation Surface Level;Indoor     Prosthetics   Prosthetic Care Comments  Pt is wearing prosthesis more but for inconsistent lengths of time.   Current prosthetic wear tolerance (days/week)  all day Sunday but only 1 hr yesterday   Residual limb  condition  no open areas, per pt.   Education Provided Proper wear schedule/adjustment   Person(s) Educated Patient   Education Method Explanation   Education Method Verbalized understanding                PT Education - 05/27/16 1436    Education provided Yes   Education Details HEP at the sink   Person(s) Educated Patient   Methods Explanation;Demonstration;Verbal cues;Handout   Comprehension Verbalized understanding;Returned demonstration;Verbal cues required;Need further instruction          PT Short Term Goals - 05/19/16 1800      PT SHORT TERM GOAL #1   Title Patient tolerates wear of new prosthesis daily >8 hrs total without pain or limb issues. (Target Date: 06/18/2016)   Time 1   Period Months   Status New     PT SHORT TERM GOAL #2   Title Patient verbalizes proper cleaning & demonstrates proper donning of new prosthesis. (Target Date: 06/18/2016)   Time 1   Period Months   Status New     PT SHORT TERM GOAL #3   Title Patient ambulates 300' with RW & prosthesis with cues on prosthesis control. (Target Date: 06/18/2016)   Time 1   Period Months   Status New     PT SHORT TERM GOAL #4   Title Patient ambulates 7270' with cane & prosthesis with minA. (Target Date: 06/18/2016)   Time 1   Period Months   Status New           PT Long Term Goals - 05/20/16 1618      PT LONG TERM GOAL #1   Title Patient verbalizes & demonstrates understanding of prosthetic care with new prosthesis to enable safe use. (Target Date: 07/17/2016)   Time 2   Period Months   Status New     PT LONG TERM GOAL #2   Title Patient tolerates wear of new prosthesis >90% of awake hours without skin issues to enable safe use throughout her day. (Target Date: 07/17/2016)   Time 2   Period Months   Status New     PT LONG TERM GOAL #3   Title Patient ambulates 400' with LRAD & new prosthesis modified independent for community mobility. (Target Date: 07/17/2016)   Time 2   Period Months    Status New     PT LONG TERM GOAL #4   Title Patient negotiates ramps, curbs & stairs with LRAD & new prosthesis modified independent for community access. (Target Date: 07/17/2016)   Time 2   Period Months   Status New     PT LONG TERM GOAL #5   Title Patient ambulates around furniture 100' with LRAD (single UE support) and new prosthesis carrying cup of water modified independent. (Target Date: 07/17/2016)   Time 2   Period Months   Status New     Additional Long Term Goals   Additional Long Term Goals Yes  PT LONG TERM GOAL #6   Title Berg Balance >24/56 to indicate lower fall risk. (Target Date: 07/17/2016)   Time 2   Period Months   Status New     PT LONG TERM GOAL #7   Title Timed Up & Go with LRAD (single UE support) & prosthesis <30sec to indicate lower fall risk. (Target Date: 07/17/2016)   Time 2   Period Months   Status New               Plan - 05/27/16 1556    Clinical Impression Statement Pt has progress wear time of prosthesis, although inconsistenly.  Gave standing at the sink exercises for balance and midline awareness; pt performed with intermittent UE support.   Rehab Potential Good   PT Frequency 2x / week   PT Duration Other (comment)  9 weeks (60 days)   PT Treatment/Interventions ADLs/Self Care Home Management;DME Instruction;Gait training;Stair training;Functional mobility training;Therapeutic activities;Therapeutic exercise;Balance training;Neuromuscular re-education;Patient/family education;Prosthetic Training   PT Next Visit Plan HEP for midline & balance, prosthetic gait with RW   Consulted and Agree with Plan of Care Patient      Patient will benefit from skilled therapeutic intervention in order to improve the following deficits and impairments:  Abnormal gait, Decreased activity tolerance, Decreased balance, Decreased endurance, Decreased knowledge of use of DME, Decreased range of motion, Decreased strength, Postural dysfunction, Prosthetic  Dependency  Visit Diagnosis: Unsteadiness on feet  Other abnormalities of gait and mobility  Muscle weakness (generalized)  Stiffness of right hip, not elsewhere classified     Problem List Patient Active Problem List   Diagnosis Date Noted  . Chronic systolic HF (heart failure) (HCC) 05/12/2016  . ACS (acute coronary syndrome) (HCC) 04/21/2016  . NSTEMI (non-ST elevated myocardial infarction) (HCC) 04/21/2016  . Brain aneurysm 04/13/2016  . Hypertension   . Dyslipidemia   . CAD in native artery   . Chest pain 01/29/2016  . PAC (premature atrial contraction) 08/14/2014  . PVC's (premature ventricular contractions) 08/14/2014  . Palpitations 11/02/2013  . Hypercholesteremia   . Essential hypertension, benign 11/01/2013  . Peripheral vascular disease, unspecified 11/01/2013    Hortencia ConradiKarissa , PTA  05/27/16, 4:00 PM [facility] [address] [place], [place], [address] Phone: [phone]   Fax:  [phone]  Name: [name] MRN: [id number] Date of Birth: 1940/08/17

## 2016-05-27 NOTE — Patient Instructions (Signed)
Do each exercise 1-2 times per day Do each exercise5 repetitions Hold each exercise for 3 seconds to feel your location  AT SINK FIND YOUR MIDLINE POSITION AND PLACE FEET EQUAL DISTANCE FROM THE MIDLINE.  USE TAPE ON FLOOR TO MARK THE MIDLINE POSITION. You also should try to feel with your limb pressure in socket.  You are trying to feel with limb what you used to feel with the bottom of your foot.  1. Side to Side Shift: Moving your hips only (not shoulders): move weight onto your left leg, HOLD/FEEL.  Move back to equal weight on each leg, HOLD/FEEL. Move weight onto your right leg, HOLD/FEEL. Move back to equal weight on each leg, HOLD/FEEL. Repeat. 2. Front to Back Shift: Moving your hips only (not shoulders): move your weight forward onto your toes, HOLD/FEEL. Move your weight back to equal Flat Foot on both legs, HOLD/FEEL. Move your weight back onto your heels, HOLD/FEEL. Move your weight back to equal on both legs, HOLD/FEEL. Repeat. 3. Moving Cones / Cups: With equal weight on each leg: Hold on with one hand the first time, then progress to no hand supports. Move cups from one side of sink to the other. Place cups ~2" out of your reach, progress to 10" beyond reach. 4. Overhead/Upward Reaching: alternated reaching up to top cabinets or ceiling if no cabinets present. Keep equal weight on each leg. Start with one hand support on counter while other hand reaches and progress to no hand support with reaching. 5.   Looking Over Shoulders: With equal weight on each leg: alternate turning to look          over your shoulders with one hand support on counter as needed. Shift weight to             side looking, pull hip then shoulder then head/eyes around to look behind you. Start       with one hand support & progress to no hand support. 

## 2016-05-28 ENCOUNTER — Ambulatory Visit: Payer: Medicare Other | Admitting: Physical Therapy

## 2016-05-28 DIAGNOSIS — R2681 Unsteadiness on feet: Secondary | ICD-10-CM

## 2016-05-28 DIAGNOSIS — R2689 Other abnormalities of gait and mobility: Secondary | ICD-10-CM

## 2016-05-28 DIAGNOSIS — M6281 Muscle weakness (generalized): Secondary | ICD-10-CM

## 2016-05-28 DIAGNOSIS — M25651 Stiffness of right hip, not elsewhere classified: Secondary | ICD-10-CM

## 2016-05-28 NOTE — Therapy (Signed)
[facility] [address] [place], [place], [address] Phone: [phone]   Fax:  [phone]  Physical Therapy Treatment  Patient Details  Name: [name] MRN: [id number] Date of Birth: Oct 11, 1940 Referring Provider: Maurice Small, MD  Encounter Date: 05/28/2016      PT End of Session - 05/28/16 1406    Visit Number 4   Number of Visits 18   Date for PT Re-Evaluation 07/17/16   Authorization Type Medicare G-Code & progress note   PT Start Time 1315   PT Stop Time 1353   PT Time Calculation (min) 38 min   Equipment Utilized During Treatment Gait belt   Activity Tolerance Patient tolerated treatment well   Behavior During Therapy WFL for tasks assessed/performed      Past Medical History:  Diagnosis Date  . ACS (acute coronary syndrome) (HCC)   . Atypical chest pain    negative cardiolyte 09/2007 w Dr Mayford Knife  . CAD in native artery    a. Cor CT 02/2016 - poor quality due to motion/poor filling of coronary arteries with tortuosity, 2 isolated foci in LM (nonobstructive), calcium score 23rd percentile, normal left dominant coronary arteries with noted motion artifact and poor distal vessel visualization.  . Dyslipidemia   . GERD (gastroesophageal reflux disease)   . Hypercholesteremia   . Hypertension   . NSTEMI (non-ST elevated myocardial infarction) (HCC) 04/2016  . PAC (premature atrial contraction)    Event monitors 10/2013 and 08/2014 showed NSR, ST, PACs, PVCs.   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 Posterior communicating artery aneurysm    followed by Dr Venetia Maxon  . PVC's (premature ventricular contractions)    Event monitors 10/2013 and 08/2014 showed NSR, ST, PACs, PVCs.     Past Surgical History:  Procedure Laterality Date  . ABDOMINAL HYSTERECTOMY    . 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 CARDIAC  CATHETERIZATION N/A 04/21/2016   Procedure: Left Heart Cath and Coronary Angiography;  Surgeon: Corky Crafts, MD;  [facility]: [facility] INVASIVE CV LAB;  Service: Cardiovascular;  Laterality: N/A;  . IR GENERIC HISTORICAL  02/13/2016   IR RADIOLOGY PERIPHERAL GUIDED IV START 02/13/2016 Berdine Dance, MD [facility]INTER[facility]  . IR GENERIC HISTORICAL  02/13/2016   IR US GUIDE VASC ACCESS LEFT 02/13/2016 Berdine Dance, MD [facility]    There were no vitals filed for this visit.      Subjective Assessment - 05/28/16 1320    Subjective Pt worked at standing at the sink this morning.   Pertinent History PAD, HTN, systolic HF, CAD, MI, Brain aneurysm   Limitations Standing;Walking   Patient Stated Goals Walk in house with home with prosthesis with nothing or cane and community with walker   Currently in Pain? No/denies   Pain Onset More than a month ago                         [facility] Adult PT Treatment/Exercise - 05/28/16 0001      Ambulation/Gait   Ambulation/Gait Yes   Ambulation/Gait Assistance 4: Min assist   Ambulation/Gait Assistance Details training with SPC- cues for sequence, left steplength and foot position   Ambulation Distance (Feet) 115 Feet  + 100x2 with RW    Assistive device Prosthesis;Straight cane   Gait Pattern Decreased step length - left;Decreased stance  time - right;Decreased stride length;Decreased hip/knee flexion - right;Decreased weight shift to right;Right hip hike;Right circumduction;Antalgic;Lateral hip instability;Trunk flexed;Abducted- right;Poor foot clearance - right;Step-through pattern   Ambulation Surface Level;Indoor   Ramp 4: Min assist  cues for safe technique   Pre-Gait Activities worked on increasing L step length, correct pelvic position, and greater right knee flexion during terminal stance in parallel bars, progressing with decreased UE support.     Prosthetics   Prosthetic Care Comments  Increased wear time 2hrs 2x/day   Current  prosthetic wear tolerance (days/week)  every day   Current prosthetic wear tolerance (#hours/day)  2-4hrs/day   Current prosthetic weight-bearing tolerance (hours/day)  10min   Person(s) Educated Patient   Education Method Explanation   Education Method Verbalized understanding                PT Education - 05/27/16 1436    Education provided Yes   Education Details HEP at the sink   Person(s) Educated Patient   Methods Explanation;Demonstration;Verbal cues;Handout   Comprehension Verbalized understanding;Returned demonstration;Verbal cues required;Need further instruction          PT Short Term Goals - 05/19/16 1800      PT SHORT TERM GOAL #1   Title Patient tolerates wear of new prosthesis daily >8 hrs total without pain or limb issues. (Target Date: 06/18/2016)   Time 1   Period Months   Status New     PT SHORT TERM GOAL #2   Title Patient verbalizes proper cleaning & demonstrates proper donning of new prosthesis. (Target Date: 06/18/2016)   Time 1   Period Months   Status New     PT SHORT TERM GOAL #3   Title Patient ambulates 300' with RW & prosthesis with cues on prosthesis control. (Target Date: 06/18/2016)   Time 1   Period Months   Status New     PT SHORT TERM GOAL #4   Title Patient ambulates 3170' with cane & prosthesis with minA. (Target Date: 06/18/2016)   Time 1   Period Months   Status New           PT Long Term Goals - 05/20/16 1618      PT LONG TERM GOAL #1   Title Patient verbalizes & demonstrates understanding of prosthetic care with new prosthesis to enable safe use. (Target Date: 07/17/2016)   Time 2   Period Months   Status New     PT LONG TERM GOAL #2   Title Patient tolerates wear of new prosthesis >90% of awake hours without skin issues to enable safe use throughout her day. (Target Date: 07/17/2016)   Time 2   Period Months   Status New     PT LONG TERM GOAL #3   Title Patient ambulates 400' with LRAD & new prosthesis modified  independent for community mobility. (Target Date: 07/17/2016)   Time 2   Period Months   Status New     PT LONG TERM GOAL #4   Title Patient negotiates ramps, curbs & stairs with LRAD & new prosthesis modified independent for community access. (Target Date: 07/17/2016)   Time 2   Period Months   Status New     PT LONG TERM GOAL #5   Title Patient ambulates around furniture 100' with LRAD (single UE support) and new prosthesis carrying cup of water modified independent. (Target Date: 07/17/2016)   Time 2   Period Months   Status New     Additional Long  Term Goals   Additional Long Term Goals Yes     PT LONG TERM GOAL #6   Title Berg Balance >24/56 to indicate lower fall risk. (Target Date: 07/17/2016)   Time 2   Period Months   Status New     PT LONG TERM GOAL #7   Title Timed Up & Go with LRAD (single UE support) & prosthesis <30sec to indicate lower fall risk. (Target Date: 07/17/2016)   Time 2   Period Months   Status New               Plan - 05/28/16 1407    Clinical Impression Statement Worked on training pt's awareness on appropriate step length, weight shifting and pelvic position, pt demostrates some carry over with manual/verbal cues.  Pt requires min A with gait using SPC and supervision with RW.   Rehab Potential Good   PT Frequency 2x / week   PT Duration Other (comment)  9 weeks (60 days)   PT Treatment/Interventions ADLs/Self Care Home Management;DME Instruction;Gait training;Stair training;Functional mobility training;Therapeutic activities;Therapeutic exercise;Balance training;Neuromuscular re-education;Patient/family education;Prosthetic Training   PT Next Visit Plan HEP for midline & balance, prosthetic gait with RW   Consulted and Agree with Plan of Care Patient      Patient will benefit from skilled therapeutic intervention in order to improve the following deficits and impairments:  Abnormal gait, Decreased activity tolerance, Decreased balance,  Decreased endurance, Decreased knowledge of use of DME, Decreased range of motion, Decreased strength, Postural dysfunction, Prosthetic Dependency  Visit Diagnosis: Unsteadiness on feet  Other abnormalities of gait and mobility  Stiffness of right hip, not elsewhere classified  Muscle weakness (generalized)     Problem List Patient Active Problem List   Diagnosis Date Noted  . Chronic systolic HF (heart failure) (HCC) 05/12/2016  . ACS (acute coronary syndrome) (HCC) 04/21/2016  . NSTEMI (non-ST elevated myocardial infarction) (HCC) 04/21/2016  . Brain aneurysm 04/13/2016  . Hypertension   . Dyslipidemia   . CAD in native artery   . Chest pain 01/29/2016  . PAC (premature atrial contraction) 08/14/2014  . PVC's (premature ventricular contractions) 08/14/2014  . Palpitations 11/02/2013  . Hypercholesteremia   . Essential hypertension, benign 11/01/2013  . Peripheral vascular disease, unspecified 11/01/2013   Hortencia ConradiKarissa , PTA  05/28/16, 2:16 PM [facility] [address] [place], [place], [address] Phone: [phone]   Fax:  [phone]  Name: Anne NgShirley L Notz MRN: [id number] Date of Birth: 10-12-1940

## 2016-06-01 ENCOUNTER — Ambulatory Visit: Payer: Medicare Other | Admitting: Physical Therapy

## 2016-06-01 ENCOUNTER — Encounter: Payer: Self-pay | Admitting: Physical Therapy

## 2016-06-01 DIAGNOSIS — M6281 Muscle weakness (generalized): Secondary | ICD-10-CM

## 2016-06-01 DIAGNOSIS — R2681 Unsteadiness on feet: Secondary | ICD-10-CM | POA: Diagnosis not present

## 2016-06-01 DIAGNOSIS — R2689 Other abnormalities of gait and mobility: Secondary | ICD-10-CM

## 2016-06-01 DIAGNOSIS — M25651 Stiffness of right hip, not elsewhere classified: Secondary | ICD-10-CM

## 2016-06-01 NOTE — Therapy (Signed)
[facility] [address] [place], [place], [address] Phone: [phone]   Fax:  [phone]  Physical Therapy Treatment  Patient Details  Name: [name] MRN: [id number] Date of Birth: Jan 10, 1941 Referring Provider: Maurice SmallElaine Griffin, MD  Encounter Date: 06/01/2016      PT End of Session - 06/01/16 1539    Visit Number 5   Number of Visits 18   Date for PT Re-Evaluation 07/17/16   Authorization Type Medicare G-Code & progress note   PT Start Time 1320   PT Stop Time 1400   PT Time Calculation (min) 40 min   Equipment Utilized During Treatment Gait belt   Activity Tolerance Patient tolerated treatment well   Behavior During Therapy [facility] for tasks assessed/performed      Past Medical History:  Diagnosis Date  . ACS (acute coronary syndrome) (HCC)   . Atypical chest pain    negative cardiolyte 09/2007 w Dr Mayford Knifeurner  . CAD in native artery    a. Cor CT 02/2016 - poor quality due to motion/poor filling of coronary arteries with tortuosity, 2 isolated foci in LM (nonobstructive), calcium score 23rd percentile, normal left dominant coronary arteries with noted motion artifact and poor distal vessel visualization.  . Dyslipidemia   . GERD (gastroesophageal reflux disease)   . Hypercholesteremia   . Hypertension   . NSTEMI (non-ST elevated myocardial infarction) (HCC) 04/2016  . PAC (premature atrial contraction)    Event monitors 10/2013 and 08/2014 showed NSR, ST, PACs, PVCs.   [name]. 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 Posterior communicating artery aneurysm    followed by Dr Venetia MaxonStern  . PVC's (premature ventricular contractions)    Event monitors 10/2013 and 08/2014 showed NSR, ST, PACs, PVCs.     Past Surgical History:  Procedure Laterality Date  . ABDOMINAL HYSTERECTOMY    . 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 CARDIAC  CATHETERIZATION N/A 04/21/2016   Procedure: Left Heart Cath and Coronary Angiography;  Surgeon: Corky CraftsJayadeep S Varanasi, MD;  [facility];  Service: Cardiovascular;  Laterality: N/A;  . IR GENERIC HISTORICAL  02/13/2016   IR RADIOLOGY PERIPHERAL GUIDED IV START 02/13/2016 Berdine DanceMichael Shick, MD [facility]  . IR GENERIC HISTORICAL  02/13/2016   IR US GUIDE VASC ACCESS LEFT 02/13/2016 Berdine DanceMichael Shick, MD [facility]    There were no vitals filed for this visit.      Subjective Assessment - 06/01/16 1322    Subjective Pt worked on standing balance at the sink over the weekend.  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 Yes   Pain Score 5    Pain Location Leg   Pain Orientation Right   Pain Descriptors / Indicators Sore   Pain Type Phantom pain   Pain Onset More than a month ago   Pain Frequency Intermittent   Aggravating Factors  Pain after RLE weight bearing activity.  [facility] Adult PT Treatment/Exercise - 06/01/16 0001      Ambulation/Gait   Ambulation/Gait Yes   Ambulation/Gait Assistance 4: Min guard;4: Min assist  Min A with SPC   Ambulation/Gait Assistance Details Practised appropriate step lengths, pelvic position,, cues for upright gaze   Ambulation Distance (Feet) 115 Feet  +70   Assistive device Prosthesis;Rolling walker;Straight cane   Gait Pattern Decreased step length - left;Decreased stance time - right;Decreased stride length;Decreased hip/knee flexion - right;Decreased weight shift to right;Right hip hike;Right circumduction;Antalgic;Lateral hip instability;Trunk flexed;Abducted- right;Poor foot clearance - right;Step-through  pattern   Ambulation Surface Level;Indoor   Pre-Gait Activities In parallel bars working on weight shifiting onto RLE with 1 UE support and maintaining neutral pelvis (vs roatated back to the right) through gait cycle, with manual and verbal cues.     Prosthetics   Prosthetic Care Comments  Pt wakes up and puts on prosthesis when she is getting ready for the day.  Increase wear to 3hrs 2x/day   Current prosthetic wear tolerance (days/week)  every day   Current prosthetic wear tolerance (#hours/day)  2hrs 2x/day   Current prosthetic weight-bearing tolerance (hours/day)  15-62min   Edema none per pt.   Residual limb condition  no open areas, per pt.   Person(s) Educated Patient   Education Method Explanation   Education Method Verbalized understanding                  PT Short Term Goals - 05/19/16 1800      PT SHORT TERM GOAL #1   Title Patient tolerates wear of new prosthesis daily >8 hrs total without pain or limb issues. (Target Date: 06/18/2016)   Time 1   Period Months   Status New     PT SHORT TERM GOAL #2   Title Patient verbalizes proper cleaning & demonstrates proper donning of new prosthesis. (Target Date: 06/18/2016)   Time 1   Period Months   Status New     PT SHORT TERM GOAL #3   Title Patient ambulates 300' with RW & prosthesis with cues on prosthesis control. (Target Date: 06/18/2016)   Time 1   Period Months   Status New     PT SHORT TERM GOAL #4   Title Patient ambulates 42' with cane & prosthesis with minA. (Target Date: 06/18/2016)   Time 1   Period Months   Status New           PT Long Term Goals - 05/20/16 1618      PT LONG TERM GOAL #1   Title Patient verbalizes & demonstrates understanding of prosthetic care with new prosthesis to enable safe use. (Target Date: 07/17/2016)   Time 2   Period Months   Status New     PT LONG TERM GOAL #2   Title Patient tolerates wear of new prosthesis >90% of awake hours without skin issues to enable  safe use throughout her day. (Target Date: 07/17/2016)   Time 2   Period Months   Status New     PT LONG TERM GOAL #3   Title Patient ambulates 400' with LRAD & new prosthesis modified independent for community mobility. (Target Date: 07/17/2016)   Time 2   Period Months   Status New     PT LONG TERM GOAL #4   Title Patient negotiates ramps, curbs & stairs with LRAD & new prosthesis modified independent for community access. (Target Date: 07/17/2016)   Time 2   Period Months  Status New     PT LONG TERM GOAL #5   Title Patient ambulates around furniture 100' with LRAD (single UE support) and new prosthesis carrying cup of water modified independent. (Target Date: 07/17/2016)   Time 2   Period Months   Status New     Additional Long Term Goals   Additional Long Term Goals Yes     PT LONG TERM GOAL #6   Title Berg Balance >24/56 to indicate lower fall risk. (Target Date: 07/17/2016)   Time 2   Period Months   Status New     PT LONG TERM GOAL #7   Title Timed Up & Go with LRAD (single UE support) & prosthesis <30sec to indicate lower fall risk. (Target Date: 07/17/2016)   Time 2   Period Months   Status New               Plan - 06/01/16 1540    Clinical Impression Statement Pt is making progress with steadily increased wear time of prosthesis, standing and gait in her home with walker.  Pt contniues to require min A (HHA) with gait training with SPC with cues for gait mechanics and sequencing.   Rehab Potential Good   PT Frequency 2x / week   PT Duration Other (comment)  9 weeks (60 days)   PT Treatment/Interventions ADLs/Self Care Home Management;DME Instruction;Gait training;Stair training;Functional mobility training;Therapeutic activities;Therapeutic exercise;Balance training;Neuromuscular re-education;Patient/family education;Prosthetic Training   PT Next Visit Plan NMR for midline & balance, prosthetic gait with RW   Consulted and Agree with Plan of Care Patient       Patient will benefit from skilled therapeutic intervention in order to improve the following deficits and impairments:  Abnormal gait, Decreased activity tolerance, Decreased balance, Decreased endurance, Decreased knowledge of use of DME, Decreased range of motion, Decreased strength, Postural dysfunction, Prosthetic Dependency  Visit Diagnosis: Unsteadiness on feet  Other abnormalities of gait and mobility  Stiffness of right hip, not elsewhere classified  Muscle weakness (generalized)     Problem List Patient Active Problem List   Diagnosis Date Noted  . Chronic systolic HF (heart failure) (HCC) 05/12/2016  . ACS (acute coronary syndrome) (HCC) 04/21/2016  . NSTEMI (non-ST elevated myocardial infarction) (HCC) 04/21/2016  . Brain aneurysm 04/13/2016  . Hypertension   . Dyslipidemia   . CAD in native artery   . Chest pain 01/29/2016  . PAC (premature atrial contraction) 08/14/2014  . PVC's (premature ventricular contractions) 08/14/2014  . Palpitations 11/02/2013  . Hypercholesteremia   . Essential hypertension, benign 11/01/2013  . Peripheral vascular disease, unspecified 11/01/2013    Hortencia ConradiKarissa , PTA  06/01/16, 4:00 PM [facility] [address] [place], [place], [address] Phone: [phone]   Fax:  [phone]  Name: [name] MRN: [id number] Date of Birth: 14-Jan-1941

## 2016-06-08 ENCOUNTER — Encounter: Payer: Self-pay | Admitting: Physical Therapy

## 2016-06-08 ENCOUNTER — Ambulatory Visit: Payer: Medicare Other | Admitting: Physical Therapy

## 2016-06-08 DIAGNOSIS — R2689 Other abnormalities of gait and mobility: Secondary | ICD-10-CM

## 2016-06-08 DIAGNOSIS — R2681 Unsteadiness on feet: Secondary | ICD-10-CM

## 2016-06-08 DIAGNOSIS — M6281 Muscle weakness (generalized): Secondary | ICD-10-CM

## 2016-06-08 NOTE — Therapy (Signed)
[facility] [address] [place], [place], [address] Phone: [phone]   Fax:  [phone]  Physical Therapy Treatment  Patient Details  Name: Cindy NgShirley L Fields MRN: [id number] Date of Birth: [birth year] Referring Provider: Maurice SmallElaine Griffin, MD  Encounter Date: 06/08/2016      PT End of Session - 06/08/16 1534    Visit Number 6   Number of Visits 18   Date for PT Re-Evaluation 07/17/16   Authorization Type Medicare G-Code & progress note   PT Start Time 1440   PT Stop Time 1528   PT Time Calculation (min) 48 min   Equipment Utilized During Treatment Gait belt   Activity Tolerance Patient tolerated treatment well   Behavior [facility] Therapy [facility] for tasks assessed/performed      Past Medical History:  Diagnosis Date  . ACS (acute coronary syndrome) (HCC)   . Atypical chest pain    negative cardiolyte 09/2007 w Dr Mayford Knifeurner  . CAD in native artery    a. Cor CT 02/2016 - poor quality due to motion/poor filling of coronary arteries with tortuosity, 2 isolated foci in LM (nonobstructive), calcium score 23rd percentile, normal left dominant coronary arteries with noted motion artifact and poor distal vessel visualization.  . Dyslipidemia   . GERD (gastroesophageal reflux disease)   . Hypercholesteremia   . Hypertension   . NSTEMI (non-ST elevated myocardial infarction) (HCC) 04/2016  . PAC (premature atrial contraction)    Event monitors 10/2013 and 08/2014 showed NSR, ST, PACs, PVCs.   [name]. 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 Posterior communicating artery aneurysm    followed by Dr Venetia MaxonStern  . PVC's (premature ventricular contractions)    Event monitors 10/2013 and 08/2014 showed NSR, ST, PACs, PVCs.     Past Surgical History:  Procedure Laterality Date  . ABDOMINAL HYSTERECTOMY    . 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 CARDIAC  CATHETERIZATION N/A 04/21/2016   Procedure: Left Heart Cath and Coronary Angiography;  Surgeon: Corky CraftsJayadeep S Varanasi, MD;  [facility];  Service: Cardiovascular;  Laterality: N/A;  . IR GENERIC HISTORICAL  02/13/2016   IR RADIOLOGY PERIPHERAL GUIDED IV START 02/13/2016 Berdine DanceMichael Shick, MD [facility]INTER[facility]  . IR GENERIC HISTORICAL  02/13/2016   IR US GUIDE VASC ACCESS LEFT 02/13/2016 Berdine DanceMichael Shick, MD [facility]    There were no vitals filed for this visit.      Subjective Assessment - 06/08/16 1444    Subjective No falls since last visit. Pt plans to go to [facility] to[facility]orrow for adjustment to prothesis due to discomfort in R groin area.   Pertinent History PAD, HTN, systolic HF, CAD, MI, Brain aneurysm   Limitations Standing;Walking   Patient Stated Goals Walk in house with home with prosthesis with nothing or cane and community with walker   Currently in Pain? Yes   Pain Score 6    Pain Location Leg   Pain Orientation Right   Pain Descriptors / Indicators Aching;Throbbing   Pain Type Phantom pain   Pain Onset More than a month ago   Pain Frequency Intermittent                         [facility] Adult PT Treatment/Exercise - 06/08/16 0001      Ambulation/Gait   Ambulation/Gait Yes   Ambulation/Gait Assistance  5: Supervision;4: Min assist   Ambulation/Gait Assistance Details Practised appropriate step lengths, pelvic position, and sequencing with SPC   Ambulation Distance (Feet) 115 Feet  + 5480ft with SPC   Assistive device Prosthesis;Rolling walker;Straight cane   Gait Pattern Decreased step length - left;Decreased stance time - right;Decreased stride length;Decreased hip/knee flexion - right;Decreased weight shift to right;Right hip hike;Right circumduction;Antalgic;Lateral hip instability;Trunk flexed;Abducted- right;Poor foot clearance - right;Step-through pattern   Ambulation Surface Level;Outdoor   Stairs Yes   Stairs Assistance 4: Min guard   Stair  Management Technique Step to pattern;Two rails   Number of Stairs 4  x2   Ramp 4: Min assist   Ramp Details (indicate cue type and reason) cues for safe technique with RW   Curb 4: Min assist   Curb Details (indicate cue type and reason) cues for safe technique with RW.   Pre-Gait Activities stepping strategies and sequencing with SPC, min A     Prosthetics   Prosthetic Care Comments  Sometimes pt will put sleeve on and if it feels tight will wear it awhile before putting on prosthsis   Current prosthetic wear tolerance (days/week)  every day   Current prosthetic wear tolerance (#hours/day)  4-7hrs/day    Residual limb condition  no open areas, per pt.   Person(s) Educated Patient   Education Method Explanation   Education Method Verbalized understanding                  PT Short Term Goals - 06/08/16 1518      PT SHORT TERM GOAL #1   Title `           PT Long Term Goals - 05/20/16 1618      PT LONG TERM GOAL #1   Title Patient verbalizes & demonstrates understanding of prosthetic care with new prosthesis to enable safe use. (Target Date: 07/17/2016)   Time 2   Period Months   Status New     PT LONG TERM GOAL #2   Title Patient tolerates wear of new prosthesis >90% of awake hours without skin issues to enable safe use throughout her day. (Target Date: 07/17/2016)   Time 2   Period Months   Status New     PT LONG TERM GOAL #3   Title Patient ambulates 400' with LRAD & new prosthesis modified independent for community mobility. (Target Date: 07/17/2016)   Time 2   Period Months   Status New     PT LONG TERM GOAL #4   Title Patient negotiates ramps, curbs & stairs with LRAD & new prosthesis modified independent for community access. (Target Date: 07/17/2016)   Time 2   Period Months   Status New     PT LONG TERM GOAL #5   Title Patient ambulates around furniture 100' with LRAD (single UE support) and new prosthesis carrying cup of water modified independent.  (Target Date: 07/17/2016)   Time 2   Period Months   Status New     Additional Long Term Goals   Additional Long Term Goals Yes     PT LONG TERM GOAL #6   Title Berg Balance >24/56 to indicate lower fall risk. (Target Date: 07/17/2016)   Time 2   Period Months   Status New     PT LONG TERM GOAL #7   Title Timed Up & Go with LRAD (single UE support) & prosthesis <30sec to indicate lower fall risk. (Target Date: 07/17/2016)   Time 2  Period Months   Status New               Plan - 06/08/16 1539    Clinical Impression Statement Pt made progress today with gait using SPC requiring min A vs mod A with better understanding of sequencing.  Pt fatigues quickly with gait using a SPC requiring seated rest.  Pt continues to have difficulty maintaining appropriate pelvic/hip position during gait with RW tending to rotate to the right posteriorly.      Rehab Potential Good   PT Frequency 2x / week   PT Duration Other (comment)  9 weeks (60 days)   PT Treatment/Interventions ADLs/Self Care Home Management;DME Instruction;Gait training;Stair training;Functional mobility training;Therapeutic activities;Therapeutic exercise;Balance training;Neuromuscular re-education;Patient/family education;Prosthetic Training   PT Next Visit Plan NMR for midline & balance, prosthetic gait with RW   Consulted and Agree with Plan of Care Patient      Patient will benefit from skilled therapeutic intervention in order to improve the following deficits and impairments:  Abnormal gait, Decreased activity tolerance, Decreased balance, Decreased endurance, Decreased knowledge of use of DME, Decreased range of motion, Decreased strength, Postural dysfunction, Prosthetic Dependency  Visit Diagnosis: Unsteadiness on feet  Other abnormalities of gait and mobility  Muscle weakness (generalized)     Problem List Patient Active Problem List   Diagnosis Date Noted  . Chronic systolic HF (heart failure) (HCC)  05/12/2016  . ACS (acute coronary syndrome) (HCC) 04/21/2016  . NSTEMI (non-ST elevated myocardial infarction) (HCC) 04/21/2016  . Brain aneurysm 04/13/2016  . Hypertension   . Dyslipidemia   . CAD in native artery   . Chest pain 01/29/2016  . PAC (premature atrial contraction) 08/14/2014  . PVC's (premature ventricular contractions) 08/14/2014  . Palpitations 11/02/2013  . Hypercholesteremia   . Essential hypertension, benign 11/01/2013  . Peripheral vascular disease, unspecified 11/01/2013    Hortencia Conradi, PTA  06/08/16, 3:44 PM [facility] [address] [place], [place], [address] Phone: [phone]   Fax:  [phone]  Name: JEIDY HOERNER MRN: [id number] Date of Birth: May 22, 1941

## 2016-06-10 ENCOUNTER — Ambulatory Visit (HOSPITAL_COMMUNITY): Payer: Medicare Other | Attending: Cardiology

## 2016-06-10 ENCOUNTER — Other Ambulatory Visit: Payer: Self-pay

## 2016-06-10 DIAGNOSIS — I5181 Takotsubo syndrome: Secondary | ICD-10-CM | POA: Diagnosis present

## 2016-06-10 DIAGNOSIS — I517 Cardiomegaly: Secondary | ICD-10-CM | POA: Insufficient documentation

## 2016-06-10 LAB — ECHOCARDIOGRAM COMPLETE
AOASC: 28 cm
AOVTI: 47.6 cm
AV Area mean vel: 1 cm2
AV Peak grad: 20 mmHg
AV pk vel: 222 cm/s
AVAREAMEANVIN: 0.61 cm2/m2
AVAREAVTI: 0.89 cm2
AVAREAVTIIND: 0.59 cm2/m2
AVCELMEANRAT: 0.75
AVG: 10 mmHg
AVLVOTPG: 9 mmHg
AVPHT: 481 ms
Ao pk vel: 0.67 m/s
CHL CUP AV PEAK INDEX: 0.54
CHL CUP AV VALUE AREA INDEX: 0.59
CHL CUP AV VEL: 0.97
CHL CUP DOP CALC LVOT VTI: 34.6 cm
CHL CUP MV DEC (S): 349
CHL CUP STROKE VOLUME: 35 mL
CHL CUP TV REG PEAK VELOCITY: 264 cm/s
DOP CAL AO MEAN VELOCITY: 143 cm/s
EERAT: 20.38
EWDT: 349 ms
FS: 40 % (ref 28–44)
IV/PV OW: 1.7
LA ID, A-P, ES: 40 mm
LA diam end sys: 40 mm
LA diam index: 2.43 cm/m2
LA vol A4C: 61 ml
LV E/e' medial: 20.38
LV E/e'average: 20.38
LV e' LATERAL: 3.9 cm/s
LV sys vol: 18 mL (ref 14–42)
LVDIAVOL: 53 mL (ref 46–106)
LVDIAVOLIN: 32 mL/m2
LVOT area: 1.33 cm2
LVOT peak VTI: 0.73 cm
LVOT peak vel: 149 cm/s
LVOTD: 13 mm
LVOTSV: 46 mL
LVSYSVOLIN: 11 mL/m2
MV pk A vel: 117 m/s
MV pk E vel: 79.5 m/s
MVPG: 3 mmHg
PW: 9.84 mm — AB (ref 0.6–1.1)
RV LATERAL S' VELOCITY: 21.8 cm/s
RV sys press: 31 mmHg
Simpson's disk: 66
TDI e' lateral: 3.9
TDI e' medial: 4.39
TR max vel: 264 cm/s
Valve area: 0.97 cm2

## 2016-06-12 ENCOUNTER — Ambulatory Visit: Payer: Medicare Other | Attending: Family Medicine | Admitting: Physical Therapy

## 2016-06-12 ENCOUNTER — Encounter: Payer: Self-pay | Admitting: Physical Therapy

## 2016-06-12 DIAGNOSIS — R2689 Other abnormalities of gait and mobility: Secondary | ICD-10-CM | POA: Diagnosis present

## 2016-06-12 DIAGNOSIS — M6281 Muscle weakness (generalized): Secondary | ICD-10-CM | POA: Diagnosis present

## 2016-06-12 DIAGNOSIS — R2681 Unsteadiness on feet: Secondary | ICD-10-CM | POA: Diagnosis not present

## 2016-06-12 DIAGNOSIS — M25651 Stiffness of right hip, not elsewhere classified: Secondary | ICD-10-CM | POA: Insufficient documentation

## 2016-06-12 NOTE — Therapy (Signed)
[facility] [address] [place], [place], [address] Phone: [phone]   Fax:  [phone]  Physical Therapy Treatment  Patient Details  Name: [name] MRN: [id number] Date of Birth: 08/18/1940 Referring Provider: Maurice Small, MD  Encounter Date: 06/12/2016      PT End of Session - 06/12/16 1610    Visit Number 7   Number of Visits 18   Date for PT Re-Evaluation 07/17/16   Authorization Type Medicare G-Code & progress note   PT Start Time 1400   PT Stop Time 1448   PT Time Calculation (min) 48 min   Equipment Utilized During Treatment Gait belt   Activity Tolerance Patient tolerated treatment well   Behavior During Therapy [facility] for tasks assessed/performed      Past Medical History:  Diagnosis Date  . ACS (acute coronary syndrome) (HCC)   . Atypical chest pain    negative cardiolyte 09/2007 w Dr Mayford Knife  . CAD in native artery    a. Cor CT 02/2016 - poor quality due to motion/poor filling of coronary arteries with tortuosity, 2 isolated foci in LM (nonobstructive), calcium score 23rd percentile, normal left dominant coronary arteries with noted motion artifact and poor distal vessel visualization.  . Dyslipidemia   . GERD (gastroesophageal reflux disease)   . Hypercholesteremia   . Hypertension   . NSTEMI (non-ST elevated myocardial infarction) (HCC) 04/2016  . PAC (premature atrial contraction)    Event monitors 10/2013 and 08/2014 showed NSR, ST, PACs, PVCs.   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 Posterior communicating artery aneurysm    followed by Dr Venetia Maxon  . PVC's (premature ventricular contractions)    Event monitors 10/2013 and 08/2014 showed NSR, ST, PACs, PVCs.     Past Surgical History:  Procedure Laterality Date  . ABDOMINAL HYSTERECTOMY    . 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 CARDIAC  CATHETERIZATION N/A 04/21/2016   Procedure: Left Heart Cath and Coronary Angiography;  Surgeon: Corky Crafts, MD;  Location: [facility] INVASIVE CV LAB;  Service: Cardiovascular;  Laterality: N/A;  . IR GENERIC HISTORICAL  02/13/2016   IR RADIOLOGY PERIPHERAL GUIDED IV START 02/13/2016 Berdine Dance, MD [facility]  . IR GENERIC HISTORICAL  02/13/2016   IR US GUIDE VASC ACCESS LEFT 02/13/2016 Berdine Dance, MD [facility]    There were no vitals filed for this visit.      Subjective Assessment - 06/12/16 1403    Subjective Patient reports she has not been doing her HEP as much as she should. She attempted walking with the cane in the hours but is did not go well. She needed to stop frequently and hold onto the wall.   Pertinent History PAD, HTN, systolic HF, CAD, MI, Brain aneurysm   Limitations Standing;Walking   Patient Stated Goals Walk in house with home with prosthesis with nothing or cane and community with walker   Currently in Pain? Yes   Pain Score 4    Pain Location Leg   Pain Orientation Right   Pain Descriptors / Indicators Aching   Pain Type Phantom pain   Pain Onset More than a month ago   Pain Frequency Intermittent           [facility] Adult PT Treatment/Exercise - 06/12/16 1406      Ambulation/Gait   Ambulation/Gait Yes  Ambulation/Gait Assistance 5: Supervision;4: Min assist  supervision w/ RW; min assist w/ rubber tipped cane   Ambulation/Gait Assistance Details Verbal and manual facilitation to correct R hip hike, posterior rotation, posture, and positioning inside walker. Provided pt with food wedge on L heel to correct slight limb length discrepancy and correct hip hiking. Minor improvements noted. Noted during session that patient's walker was too tall and cannot be adjusted any shorter. Patient used walker from clinic adjusted to the correct height and demonstrated improve posture and positioning with in the walker. She reports liking the shorter walker. Hand held  assist for gait with rubber tipped cane. Patient advised to continue using the RW at home, as she is not yet safe with the cane.   Ambulation Distance (Feet) 115 Feet  x3 w/ RW, x1 w/ cane   Assistive device Prosthesis;Rolling walker;Straight cane   Gait Pattern Decreased step length - left;Decreased stance time - right;Decreased stride length;Decreased hip/knee flexion - right;Decreased weight shift to right;Right hip hike;Right circumduction;Antalgic;Lateral hip instability;Trunk flexed;Abducted- right;Poor foot clearance - right;Step-through pattern  Posterior Rotation at R hip   Ambulation Surface Level;Unlevel;Indoor   Ramp 5: Supervision   Ramp Details (indicate cue type and reason) w/ RW VC for hand placement on RW when descending ramp   Curb 5: Supervision   Curb Details (indicate cue type and reason) VC for sequencing     Prosthetics   Current prosthetic wear tolerance (days/week)  every day   Current prosthetic wear tolerance (#hours/day)  4-7hrs/day    Residual limb condition  No problems to report, per pt.           PT Education - 06/12/16 1609    Education provided Yes   Education Details Educated on proper walker height.    Person(s) Educated Patient   Methods Explanation;Demonstration   Comprehension Verbalized understanding;Need further instruction          PT Short Term Goals - 06/08/16 1518      PT SHORT TERM GOAL #1   Title `           PT Long Term Goals - 05/20/16 1618      PT LONG TERM GOAL #1   Title Patient verbalizes & demonstrates understanding of prosthetic care with new prosthesis to enable safe use. (Target Date: 07/17/2016)   Time 2   Period Months   Status New     PT LONG TERM GOAL #2   Title Patient tolerates wear of new prosthesis >90% of awake hours without skin issues to enable safe use throughout her day. (Target Date: 07/17/2016)   Time 2   Period Months   Status New     PT LONG TERM GOAL #3   Title Patient ambulates 400' with  LRAD & new prosthesis modified independent for community mobility. (Target Date: 07/17/2016)   Time 2   Period Months   Status New     PT LONG TERM GOAL #4   Title Patient negotiates ramps, curbs & stairs with LRAD & new prosthesis modified independent for community access. (Target Date: 07/17/2016)   Time 2   Period Months   Status New     PT LONG TERM GOAL #5   Title Patient ambulates around furniture 100' with LRAD (single UE support) and new prosthesis carrying cup of water modified independent. (Target Date: 07/17/2016)   Time 2   Period Months   Status New     Additional Long Term Goals   Additional Long Term  Goals Yes     PT LONG TERM GOAL #6   Title Berg Balance >24/56 to indicate lower fall risk. (Target Date: 07/17/2016)   Time 2   Period Months   Status New     PT LONG TERM GOAL #7   Title Timed Up & Go with LRAD (single UE support) & prosthesis <30sec to indicate lower fall risk. (Target Date: 07/17/2016)   Time 2   Period Months   Status New           Plan - 06/12/16 1611    Clinical Impression Statement Patient activity tolerance appears improved today compared to previous treatment sessions. She requires manual facilitation to maintain correct hip positioning, as she tends to rotate posteriorly and hip hike during ambulation.   Rehab Potential Good   PT Frequency 2x / week   PT Duration Other (comment)  9 weeks (60 days)   PT Treatment/Interventions ADLs/Self Care Home Management;DME Instruction;Gait training;Stair training;Functional mobility training;Therapeutic activities;Therapeutic exercise;Balance training;Neuromuscular re-education;Patient/family education;Prosthetic Training   PT Next Visit Plan NMR for midline & balance, prosthetic gait with RW and cane   Consulted and Agree with Plan of Care Patient      Patient will benefit from skilled therapeutic intervention in order to improve the following deficits and impairments:  Abnormal gait, Decreased  activity tolerance, Decreased balance, Decreased endurance, Decreased knowledge of use of DME, Decreased range of motion, Decreased strength, Postural dysfunction, Prosthetic Dependency  Visit Diagnosis: Unsteadiness on feet  Other abnormalities of gait and mobility  Muscle weakness (generalized)  Stiffness of right hip, not elsewhere classified     Problem List Patient Active Problem List   Diagnosis Date Noted  . Chronic systolic HF (heart failure) (HCC) 05/12/2016  . ACS (acute coronary syndrome) (HCC) 04/21/2016  . NSTEMI (non-ST elevated myocardial infarction) (HCC) 04/21/2016  . Brain aneurysm 04/13/2016  . Hypertension   . Dyslipidemia   . CAD in native artery   . Chest pain 01/29/2016  . PAC (premature atrial contraction) 08/14/2014  . PVC's (premature ventricular contractions) 08/14/2014  . Palpitations 11/02/2013  . Hypercholesteremia   . Essential hypertension, benign 11/01/2013  . Peripheral vascular disease, unspecified 11/01/2013    Kallie LocksHannah Shepard Keltz, SPTA 06/12/2016, 4:14 PM  [facility] [address] [place], [place], [address] Phone: [phone]   Fax:  [phone]  Name: Anne NgShirley L Paster MRN: [id number] Date of Birth: 10/01/1940

## 2016-06-15 ENCOUNTER — Ambulatory Visit: Payer: Medicare Other | Admitting: Physical Therapy

## 2016-06-15 ENCOUNTER — Encounter: Payer: Self-pay | Admitting: Physical Therapy

## 2016-06-15 DIAGNOSIS — R2689 Other abnormalities of gait and mobility: Secondary | ICD-10-CM

## 2016-06-15 DIAGNOSIS — R2681 Unsteadiness on feet: Secondary | ICD-10-CM | POA: Diagnosis not present

## 2016-06-15 DIAGNOSIS — M25651 Stiffness of right hip, not elsewhere classified: Secondary | ICD-10-CM

## 2016-06-15 DIAGNOSIS — M6281 Muscle weakness (generalized): Secondary | ICD-10-CM

## 2016-06-15 NOTE — Therapy (Signed)
[facility] [address] [place], [place], [address] Phone: [phone]   Fax:  [phone]  Physical Therapy Treatment  Patient Details  Name: Cindy NgShirley L Fields MRN: [id number] Date of Birth: 1941-06-04 Referring Provider: Maurice SmallElaine Griffin, MD  Encounter Date: 06/15/2016      PT End of Session - 06/15/16 1513    Visit Number 8   Number of Visits 18   Date for PT Re-Evaluation 07/17/16   Authorization Type Medicare G-Code & progress note   PT Start Time 1405   PT Stop Time 1447   PT Time Calculation (min) 42 min   Equipment Utilized During Treatment Gait belt   Activity Tolerance Patient tolerated treatment well   Behavior During Therapy [facility] for tasks assessed/performed      Past Medical History:  Diagnosis Date  . ACS (acute coronary syndrome) (HCC)   . Atypical chest pain    negative cardiolyte 09/2007 w Dr Mayford Knifeurner  . CAD in native artery    a. Cor CT 02/2016 - poor quality due to motion/poor filling of coronary arteries with tortuosity, 2 isolated foci in LM (nonobstructive), calcium score 23rd percentile, normal left dominant coronary arteries with noted motion artifact and poor distal vessel visualization.  . Dyslipidemia   . GERD (gastroesophageal reflux disease)   . Hypercholesteremia   . Hypertension   . NSTEMI (non-ST elevated myocardial infarction) (HCC) 04/2016  . PAC (premature atrial contraction)    Event monitors 10/2013 and 08/2014 showed NSR, ST, PACs, PVCs.   [name]. 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 Posterior communicating artery aneurysm    followed by Dr Venetia MaxonStern  . PVC's (premature ventricular contractions)    Event monitors 10/2013 and 08/2014 showed NSR, ST, PACs, PVCs.     Past Surgical History:  Procedure Laterality Date  . ABDOMINAL HYSTERECTOMY    . 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 CARDIAC  CATHETERIZATION N/A 04/21/2016   Procedure: Left Heart Cath and Coronary Angiography;  Surgeon: Corky CraftsJayadeep S Varanasi, MD;  [facility];  Service: Cardiovascular;  Laterality: N/A;  . IR GENERIC HISTORICAL  02/13/2016   IR RADIOLOGY PERIPHERAL GUIDED IV START 02/13/2016 Berdine DanceMichael Shick, MD [facility]  . IR GENERIC HISTORICAL  02/13/2016   IR US GUIDE VASC ACCESS LEFT 02/13/2016 Berdine DanceMichael Shick, MD [facility]    There were no vitals filed for this visit.      Subjective Assessment - 06/15/16 1407    Subjective Went out of town for the weekend. Wore the prosthesis during car ride but not much over the weekend. Did not walk much over the weekend.   Pertinent History PAD, HTN, systolic HF, CAD, MI, Brain aneurysm   Limitations Standing;Walking   Patient Stated Goals Walk in house with home with prosthesis with nothing or cane and community with walker   Currently in Pain? Yes   Pain Score 4    Pain Location Leg   Pain Orientation Right   Pain Descriptors / Indicators Aching   Pain Type Phantom pain   Pain Onset More than a month ago   Pain Frequency Intermittent                         [facility] Adult PT Treatment/Exercise - 06/15/16 0001      Ambulation/Gait   Ambulation/Gait Yes  Ambulation/Gait Assistance 5: Supervision   Ambulation/Gait Assistance Details Practised appropriate step lengths, pelvic position with verbal and manual cues.    Ambulation Distance (Feet) 115 Feet  x2   Assistive device Prosthesis;Rolling walker   Gait Pattern Decreased step length - left;Decreased stance time - right;Decreased stride length;Decreased hip/knee flexion - right;Decreased weight shift to right;Right hip hike;Right circumduction;Antalgic;Lateral hip instability;Trunk flexed;Abducted- right;Poor foot clearance - right;Step-through pattern   Ambulation Surface Level;Indoor   Pre-Gait Activities weight shifting with left foot in front right in back, progressing to  stepping forward with right and then walking forward in parallel bars working on correct pelvic postion, weight shifting and left step length.                PT Education - 06/15/16 1510    Education provided Yes   Education Details Progress towards 4hrs 2x/day with prosthetic wear.  Importance of continuing to practice correct gait mechanics for balance and safe/consistent locking of prosthetic knee.   Person(s) Educated Patient   Methods Explanation;Demonstration;Tactile cues;Verbal cues   Comprehension Verbalized understanding;Returned demonstration;Verbal cues required;Need further instruction;Tactile cues required          PT Short Term Goals - 06/08/16 1518      PT SHORT TERM GOAL #1   Title `           PT Long Term Goals - 05/20/16 1618      PT LONG TERM GOAL #1   Title Patient verbalizes & demonstrates understanding of prosthetic care with new prosthesis to enable safe use. (Target Date: 07/17/2016)   Time 2   Period Months   Status New     PT LONG TERM GOAL #2   Title Patient tolerates wear of new prosthesis >90% of awake hours without skin issues to enable safe use throughout her day. (Target Date: 07/17/2016)   Time 2   Period Months   Status New     PT LONG TERM GOAL #3   Title Patient ambulates 400' with LRAD & new prosthesis modified independent for community mobility. (Target Date: 07/17/2016)   Time 2   Period Months   Status New     PT LONG TERM GOAL #4   Title Patient negotiates ramps, curbs & stairs with LRAD & new prosthesis modified independent for community access. (Target Date: 07/17/2016)   Time 2   Period Months   Status New     PT LONG TERM GOAL #5   Title Patient ambulates around furniture 100' with LRAD (single UE support) and new prosthesis carrying cup of water modified independent. (Target Date: 07/17/2016)   Time 2   Period Months   Status New     Additional Long Term Goals   Additional Long Term Goals Yes     PT LONG TERM GOAL  #6   Title Berg Balance >24/56 to indicate lower fall risk. (Target Date: 07/17/2016)   Time 2   Period Months   Status New     PT LONG TERM GOAL #7   Title Timed Up & Go with LRAD (single UE support) & prosthesis <30sec to indicate lower fall risk. (Target Date: 07/17/2016)   Time 2   Period Months   Status New               Plan - 06/15/16 1514    Clinical Impression Statement Pt requires verbal and manual cues for gait mechanics but was able to follow through with training demonstrating understanding including greater consistency with  correct method to lock knee during stance phase.   Rehab Potential Good   PT Frequency 2x / week   PT Duration Other (comment)  9 weeks (60 days)   PT Treatment/Interventions ADLs/Self Care Home Management;DME Instruction;Gait training;Stair training;Functional mobility training;Therapeutic activities;Therapeutic exercise;Balance training;Neuromuscular re-education;Patient/family education;Prosthetic Training   PT Next Visit Plan NMR for midline & balance, prosthetic gait with RW and cane   Consulted and Agree with Plan of Care Patient      Patient will benefit from skilled therapeutic intervention in order to improve the following deficits and impairments:  Abnormal gait, Decreased activity tolerance, Decreased balance, Decreased endurance, Decreased knowledge of use of DME, Decreased range of motion, Decreased strength, Postural dysfunction, Prosthetic Dependency  Visit Diagnosis: Unsteadiness on feet  Other abnormalities of gait and mobility  Muscle weakness (generalized)  Stiffness of right hip, not elsewhere classified     Problem List Patient Active Problem List   Diagnosis Date Noted  . Chronic systolic HF (heart failure) (HCC) 05/12/2016  . ACS (acute coronary syndrome) (HCC) 04/21/2016  . NSTEMI (non-ST elevated myocardial infarction) (HCC) 04/21/2016  . Brain aneurysm 04/13/2016  . Hypertension   . Dyslipidemia   . CAD in  native artery   . Chest pain 01/29/2016  . PAC (premature atrial contraction) 08/14/2014  . PVC's (premature ventricular contractions) 08/14/2014  . Palpitations 11/02/2013  . Hypercholesteremia   . Essential hypertension, benign 11/01/2013  . Peripheral vascular disease, unspecified 11/01/2013    Hortencia ConradiKarissa , PTA  06/15/16, 3:19 PM [facility] [address] [place], [place], [address] Phone: [phone]   Fax:  [phone]  Name: Cindy NgShirley L Fields MRN: [id number] Date of Birth: August 31, 1940

## 2016-06-18 ENCOUNTER — Ambulatory Visit: Payer: Medicare Other | Admitting: Physician Assistant

## 2016-06-18 ENCOUNTER — Ambulatory Visit: Payer: Medicare Other | Admitting: Physical Therapy

## 2016-06-18 ENCOUNTER — Encounter: Payer: Self-pay | Admitting: Physical Therapy

## 2016-06-18 DIAGNOSIS — R2681 Unsteadiness on feet: Secondary | ICD-10-CM

## 2016-06-18 DIAGNOSIS — M25651 Stiffness of right hip, not elsewhere classified: Secondary | ICD-10-CM

## 2016-06-18 DIAGNOSIS — R2689 Other abnormalities of gait and mobility: Secondary | ICD-10-CM

## 2016-06-18 DIAGNOSIS — M6281 Muscle weakness (generalized): Secondary | ICD-10-CM

## 2016-06-19 NOTE — Therapy (Signed)
[place] [address] [place], [place], [address] Phone: [phone]   Fax:  [phone]  Physical Therapy Treatment  Patient Details  Name: [name] MRN: [id number] Date of Birth: May 17, 1941 Referring Provider: Kelton Pillar, MD  Encounter Date: 06/18/2016      PT End of Session - 06/18/16 1445    Visit Number 9   Number of Visits 18   Date for PT Re-Evaluation 07/17/16   Authorization Type Medicare G-Code & progress note   PT Start Time 1400   PT Stop Time 1445   PT Time Calculation (min) 45 min   Equipment Utilized During Treatment Gait belt   Activity Tolerance Patient tolerated treatment well   Behavior During Therapy [facility] for tasks assessed/performed      Past Medical History:  Diagnosis Date  . ACS (acute coronary syndrome) ([place])   . Atypical chest pain    negative cardiolyte 09/2007 w Dr Radford Pax  . CAD in native artery    a. Cor CT 02/2016 - poor quality due to motion/poor filling of coronary arteries with tortuosity, 2 isolated foci in LM (nonobstructive), calcium score 23rd percentile, normal left dominant coronary arteries with noted motion artifact and poor distal vessel visualization.  . Dyslipidemia   . GERD (gastroesophageal reflux disease)   . Hypercholesteremia   . Hypertension   . NSTEMI (non-ST elevated myocardial infarction) ([place]) 04/2016  . PAC (premature atrial contraction)    Event monitors 10/2013 and 08/2014 showed NSR, ST, PACs, PVCs.   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 Posterior communicating artery aneurysm    followed by Dr Vertell Limber  . PVC's (premature ventricular contractions)    Event monitors 10/2013 and 08/2014 showed NSR, ST, PACs, PVCs.     Past Surgical History:  Procedure Laterality Date  . ABDOMINAL HYSTERECTOMY    . 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 CARDIAC  CATHETERIZATION N/A 04/21/2016   Procedure: Left Heart Cath and Coronary Angiography;  Surgeon: Jettie Booze, MD;  Location: [facility] CV LAB;  Service: Cardiovascular;  Laterality: N/A;  . IR GENERIC HISTORICAL  02/13/2016   IR RADIOLOGY PERIPHERAL GUIDED IV START 02/13/2016 Greggory Keen, MD [facility]  . IR GENERIC HISTORICAL  02/13/2016   IR US GUIDE VASC ACCESS LEFT 02/13/2016 Greggory Keen, MD [facility]    There were no vitals filed for this visit.      Subjective Assessment - 06/18/16 1406    Subjective She donnes prosthesis ~11am after takes bath ( arises ~8-9) and doffes ~5-6 with going to bed 8-9pm.    Pertinent History PAD, HTN, systolic HF, CAD, MI, Brain aneurysm   Limitations Standing;Walking   Patient Stated Goals Walk in house with home with prosthesis with nothing or cane and community with walker   Currently in Pain? No/denies       Self Care: See pt education. Fall risk & stress on LLE with activities without prosthesis. PT recommended donning upon arising & doffing closer to bed time to limit out of bed without prosthesis.  Prosthetic Training: PT instructed in cleaning & switching liners with showering to facilitate increase wear as noted above. PT demo & instructed in weight shift over prosthesis in stance prior to stepping with LLE. Patient ambulated 300' with RW with cues to carryover weight shift.  See STGs  PT Education - 06/18/16 1400    Education provided Yes   Education Details Increasing activity level with high frequency of short walks room to room, 4-6 medium walks (in/out house & basic community) and 1-2 long (her max tolerable distance) each day.    Person(s) Educated Patient   Methods Explanation;Verbal cues   Comprehension Verbalized understanding;Verbal cues required;Need further instruction          PT Short Term Goals - 06/18/16 1445      PT SHORT TERM GOAL #1   Title Patient  tolerates wear of new prosthesis daily >8 hrs total without pain or limb issues. (Target Date: 06/18/2016)   Baseline MET 06/18/2016   Time 1   Period Months   Status Achieved     PT SHORT TERM GOAL #2   Title Patient verbalizes proper cleaning & demonstrates proper donning of new prosthesis. (Target Date: 06/18/2016)   Baseline MET 06/18/2016   Time 1   Period Months   Status Achieved     PT SHORT TERM GOAL #3   Title Patient ambulates 300' with RW & prosthesis with cues on prosthesis control. (Target Date: 06/18/2016)   Baseline MET 06/18/2016   Time 1   Period Months   Status Achieved     PT SHORT TERM GOAL #4   Title Patient ambulates 18' with cane & prosthesis with minA. (Target Date: 06/18/2016)   Baseline MET 06/12/2016   Time 1   Period Months   Status Achieved           PT Long Term Goals - 05/20/16 1618      PT LONG TERM GOAL #1   Title Patient verbalizes & demonstrates understanding of prosthetic care with new prosthesis to enable safe use. (Target Date: 07/17/2016)   Time 2   Period Months   Status New     PT LONG TERM GOAL #2   Title Patient tolerates wear of new prosthesis >90% of awake hours without skin issues to enable safe use throughout her day. (Target Date: 07/17/2016)   Time 2   Period Months   Status New     PT LONG TERM GOAL #3   Title Patient ambulates 400' with LRAD & new prosthesis modified independent for community mobility. (Target Date: 07/17/2016)   Time 2   Period Months   Status New     PT LONG TERM GOAL #4   Title Patient negotiates ramps, curbs & stairs with LRAD & new prosthesis modified independent for community access. (Target Date: 07/17/2016)   Time 2   Period Months   Status New     PT LONG TERM GOAL #5   Title Patient ambulates around furniture 100' with LRAD (single UE support) and new prosthesis carrying cup of water modified independent. (Target Date: 07/17/2016)   Time 2   Period Months   Status New     Additional Long Term  Goals   Additional Long Term Goals Yes     PT LONG TERM GOAL #6   Title Berg Balance >24/56 to indicate lower fall risk. (Target Date: 07/17/2016)   Time 2   Period Months   Status New     PT LONG TERM GOAL #7   Title Timed Up & Go with LRAD (single UE support) & prosthesis <30sec to indicate lower fall risk. (Target Date: 07/17/2016)   Time 2   Period Months   Status New  Plan - 06/18/16 1445    Clinical Impression Statement Patient met all STGs set for first 30 days. She appears to understand pt education to increase activity level. She is on target to meet LTGs in next 4 weeks.    Rehab Potential Good   PT Frequency 2x / week   PT Duration Other (comment)  9 weeks (60 days)   PT Treatment/Interventions ADLs/Self Care Home Management;DME Instruction;Gait training;Stair training;Functional mobility training;Therapeutic activities;Therapeutic exercise;Balance training;Neuromuscular re-education;Patient/family education;Prosthetic Training   PT Next Visit Plan G-code with Berg & TUG, NMR for midline & balance, prosthetic gait towards LTGs   Consulted and Agree with Plan of Care Patient      Patient will benefit from skilled therapeutic intervention in order to improve the following deficits and impairments:  Abnormal gait, Decreased activity tolerance, Decreased balance, Decreased endurance, Decreased knowledge of use of DME, Decreased range of motion, Decreased strength, Postural dysfunction, Prosthetic Dependency  Visit Diagnosis: Unsteadiness on feet  Other abnormalities of gait and mobility  Muscle weakness (generalized)  Stiffness of right hip, not elsewhere classified     Problem List Patient Active Problem List   Diagnosis Date Noted  . Chronic systolic HF (heart failure) ([place]) 05/12/2016  . ACS (acute coronary syndrome) ([place]) 04/21/2016  . NSTEMI (non-ST elevated myocardial infarction) ([place]) 04/21/2016  . Brain aneurysm 04/13/2016  .  Hypertension   . Dyslipidemia   . CAD in native artery   . Chest pain 01/29/2016  . PAC (premature atrial contraction) 08/14/2014  . PVC's (premature ventricular contractions) 08/14/2014  . Palpitations 11/02/2013  . Hypercholesteremia   . Essential hypertension, benign 11/01/2013  . Peripheral vascular disease, unspecified 11/01/2013    , PT, DPT 06/19/2016, 7:46 AM  [place] [address] [place], [place], [address] Phone: [phone]   Fax:  [phone]  Name: [name] MRN: [id number] Date of Birth: Feb 21, 1941

## 2016-06-22 ENCOUNTER — Ambulatory Visit: Payer: Medicare Other | Admitting: Physician Assistant

## 2016-06-23 ENCOUNTER — Ambulatory Visit: Payer: Medicare Other | Admitting: Physical Therapy

## 2016-06-23 ENCOUNTER — Encounter: Payer: Self-pay | Admitting: Physical Therapy

## 2016-06-23 DIAGNOSIS — R2689 Other abnormalities of gait and mobility: Secondary | ICD-10-CM

## 2016-06-23 DIAGNOSIS — M25651 Stiffness of right hip, not elsewhere classified: Secondary | ICD-10-CM

## 2016-06-23 DIAGNOSIS — R2681 Unsteadiness on feet: Secondary | ICD-10-CM | POA: Diagnosis not present

## 2016-06-23 DIAGNOSIS — M6281 Muscle weakness (generalized): Secondary | ICD-10-CM

## 2016-06-23 NOTE — Therapy (Addendum)
[facility] [address] [place], [place], [address] Phone: [phone]   Fax:  [phone]  Physical Therapy Treatment  Patient Details  Name: [name] MRN: [id number] Date of Birth: [birth year] Referring Provider: Kelton Pillar, MD  Encounter Date: 06/23/2016      PT End of Session - 06/23/16 1406    Visit Number 10   Number of Visits 18   Date for PT Re-Evaluation 07/17/16   Authorization Type Medicare G-Code & progress note   PT Start Time 1402   PT Stop Time 1444   PT Time Calculation (min) 42 min   Equipment Utilized During Treatment Gait belt   Activity Tolerance Patient tolerated treatment well   Behavior During Therapy [facility] for tasks assessed/performed      Past Medical History:  Diagnosis Date  . ACS (acute coronary syndrome) ([place])   . Atypical chest pain    negative cardiolyte 09/2007 w Dr Radford Pax  . CAD in native artery    a. Cor CT 02/2016 - poor quality due to motion/poor filling of coronary arteries with tortuosity, 2 isolated foci in LM (nonobstructive), calcium score 23rd percentile, normal left dominant coronary arteries with noted motion artifact and poor distal vessel visualization.  . Dyslipidemia   . GERD (gastroesophageal reflux disease)   . Hypercholesteremia   . Hypertension   . NSTEMI (non-ST elevated myocardial infarction) ([place]) 04/2016  . PAC (premature atrial contraction)    Event monitors 10/2013 and 08/2014 showed NSR, ST, PACs, PVCs.   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 Posterior communicating artery aneurysm    followed by Dr Vertell Limber  . PVC's (premature ventricular contractions)    Event monitors 10/2013 and 08/2014 showed NSR, ST, PACs, PVCs.     Past Surgical History:  Procedure Laterality Date  . ABDOMINAL HYSTERECTOMY    . 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 CARDIAC  CATHETERIZATION N/A 04/21/2016   Procedure: Left Heart Cath and Coronary Angiography;  Surgeon: Jettie Booze, MD;  Location: [facility];  Service: Cardiovascular;  Laterality: N/A;  . IR GENERIC HISTORICAL  02/13/2016   IR RADIOLOGY PERIPHERAL GUIDED IV START 02/13/2016 Greggory Keen, MD [facility]  . IR GENERIC HISTORICAL  02/13/2016   IR US GUIDE VASC ACCESS LEFT 02/13/2016 Greggory Keen, MD [facility]    There were no vitals filed for this visit.      Subjective Assessment - 06/23/16 1405    Subjective No new complaints. No falls to report or pain to report.    Pertinent History PAD, HTN, systolic HF, CAD, MI, Brain aneurysm   Limitations Standing;Walking   Patient Stated Goals Walk in house with home with prosthesis with nothing or cane and community with walker   Currently in Pain? No/denies   Pain Score 0-No pain            [facility] PT Assessment - 06/23/16 1407      Berg Balance Test   Sit to Stand Able to stand  independently using hands   Standing Unsupported Able to stand 2 minutes with supervision   Sitting with Back Unsupported but Feet Supported on Floor or Stool Able to sit safely and securely 2 minutes   Stand to Sit Controls descent by using hands   Transfers Able to transfer safely, definite need of hands   Standing  Unsupported with Eyes Closed Able to stand 10 seconds with supervision   Standing Ubsupported with Feet Together Able to place feet together independently and stand for 1 minute with supervision   From Standing, Reach Forward with Outstretched Arm Can reach forward >12 cm safely (5")  6 inches   From Standing Position, Pick up Object from [place] to pick up shoe, needs supervision  needed cues on prosthetic placement, then was safe/balanced   From Standing Position, Turn to Look Behind Over each Shoulder Turn sideways only but maintains balance   Turn 360 Degrees Able to turn 360 degrees safely but slowly  > 10 sec's both ways    Standing Unsupported, Alternately Place Feet on Step/Stool Able to complete >2 steps/needs minimal assist   Standing Unsupported, One Foot in [place] to take small step independently and hold 30 seconds   Standing on One Leg Tries to lift leg/unable to hold 3 seconds but remains standing independently   Total Score 36   Berg comment: </= 36 high fall risk for falls     Timed Up and Go Test   Normal TUG (seconds) 29.03  with RW/prosthesis             [facility] Adult PT Treatment/Exercise - 06/23/16 1437      Transfers   Transfers Sit to Stand;Stand to Sit   Sit to Stand 5: Supervision;With upper extremity assist;With armrests;From chair/3-in-1   Stand to Sit 5: Supervision;With upper extremity assist;With armrests;To chair/3-in-1     Ambulation/Gait   Ambulation/Gait Yes   Ambulation/Gait Assistance 5: Supervision   Ambulation/Gait Assistance Details cues for weight shifting and for prosthetic knee flexion with swing phase (vs circumdution)   Ambulation Distance (Feet) 100 Feet   Assistive device Prosthesis;Rolling walker   Gait Pattern Decreased step length - left;Decreased stance time - right;Decreased stride length;Decreased hip/knee flexion - right;Decreased weight shift to right;Right hip hike;Right circumduction;Antalgic;Lateral hip instability;Trunk flexed;Abducted- right;Poor foot clearance - right;Step-through pattern   Ambulation Surface Level;Indoor             PT Short Term Goals - 06/18/16 1445      PT SHORT TERM GOAL #1   Title Patient tolerates wear of new prosthesis daily >8 hrs total without pain or limb issues. (Target Date: 06/18/2016)   Baseline MET 06/18/2016   Time 1   Period Months   Status Achieved     PT SHORT TERM GOAL #2   Title Patient verbalizes proper cleaning & demonstrates proper donning of new prosthesis. (Target Date: 06/18/2016)   Baseline MET 06/18/2016   Time 1   Period Months   Status Achieved     PT SHORT TERM GOAL #3   Title  Patient ambulates 300' with RW & prosthesis with cues on prosthesis control. (Target Date: 06/18/2016)   Baseline MET 06/18/2016   Time 1   Period Months   Status Achieved     PT SHORT TERM GOAL #4   Title Patient ambulates 67' with cane & prosthesis with minA. (Target Date: 06/18/2016)   Baseline MET 06/12/2016   Time 1   Period Months   Status Achieved           PT Long Term Goals - 06/23/16 1546      PT LONG TERM GOAL #1   Title Patient verbalizes & demonstrates understanding of prosthetic care with new prosthesis to enable safe use. (Target Date: 07/17/2016)   Time 2   Period Months   Status New  PT LONG TERM GOAL #2   Title Patient tolerates wear of new prosthesis >90% of awake hours without skin issues to enable safe use throughout her day. (Target Date: 07/17/2016)   Time 2   Period Months   Status New     PT LONG TERM GOAL #3   Title Patient ambulates 400' with LRAD & new prosthesis modified independent for community mobility. (Target Date: 07/17/2016)   Time 2   Period Months   Status On-going     PT LONG TERM GOAL #4   Title Patient negotiates ramps, curbs & stairs with LRAD & new prosthesis modified independent for community access. (Target Date: 07/17/2016)   Time 2   Period Months   Status On-going     PT LONG TERM GOAL #5   Title Patient ambulates around furniture 100' with LRAD (single UE support) and new prosthesis carrying cup of water modified independent. (Target Date: 07/17/2016)   Time 2   Period Months   Status On-going     PT LONG TERM GOAL #6   Title Berg Balance >24/56 to indicate lower fall risk. (Target Date: 07/17/2016)   Baseline 08-Jul-2016: scored 36/56   Status Achieved     PT LONG TERM GOAL #7   Title Timed Up & Go with LRAD (single UE support) & prosthesis <30sec to indicate lower fall risk. (Target Date: 07/17/2016)   Baseline July 08, 2016: 29.03 sec's with RW   Status Partially Met            Plan - 2016/07/08 1406    Clinical Impression  Statement Pt scored higher on the Berg Balance Test and timed up and go using RW today vs on evaluation day, primary PT to upgrade Berg balance test LTG. Pt is making steady progress toward other LTGs as well.    Rehab Potential Good   PT Frequency 2x / week   PT Duration Other (comment)  9 weeks (60 days)   PT Treatment/Interventions ADLs/Self Care Home Management;DME Instruction;Gait training;Stair training;Functional mobility training;Therapeutic activities;Therapeutic exercise;Balance training;Neuromuscular re-education;Patient/family education;Prosthetic Training   PT Next Visit Plan NMR for midline & balance, prosthetic gait towards LTGs, work on prosthetic knee bending in parallel bars for caryover into swing phase of gait.   Consulted and Agree with Plan of Care Patient      Patient will benefit from skilled therapeutic intervention in order to improve the following deficits and impairments:  Abnormal gait, Decreased activity tolerance, Decreased balance, Decreased endurance, Decreased knowledge of use of DME, Decreased range of motion, Decreased strength, Postural dysfunction, Prosthetic Dependency  Visit Diagnosis: Unsteadiness on feet  Other abnormalities of gait and mobility  Muscle weakness (generalized)  Stiffness of right hip, not elsewhere classified       G-Codes - Jul 08, 2016 1552    Functional Assessment Tool Used Merrilee Jansky Balance 36/56 & Timed Up & Go with RW 29.03 sec      Problem List Patient Active Problem List   Diagnosis Date Noted  . Chronic systolic HF (heart failure) ([place]) 05/12/2016  . ACS (acute coronary syndrome) ([place]) 04/21/2016  . NSTEMI (non-ST elevated myocardial infarction) ([place]) 04/21/2016  . Brain aneurysm 04/13/2016  . Hypertension   . Dyslipidemia   . CAD in native artery   . Chest pain 01/29/2016  . PAC (premature atrial contraction) 08/14/2014  . PVC's (premature ventricular contractions) 08/14/2014  . Palpitations 11/02/2013  .  Hypercholesteremia   . Essential hypertension, benign 11/01/2013  . Peripheral vascular disease, unspecified 11/01/2013  Willow Ora, PTA, [facility] [address] [place], [place] [address] [phone] 13-Jul-2016, 3:52 PM   Name: [name] MRN: [id number] Date of Birth: September 10, 1940      PT Long Term Goals - 07-13-2016 1546      PT LONG TERM GOAL #1   Title Patient verbalizes & demonstrates understanding of prosthetic care with new prosthesis to enable safe use. (Target Date: 07/17/2016)   Time 2   Period Months   Status On-going     PT LONG TERM GOAL #2   Title Patient tolerates wear of new prosthesis >90% of awake hours without skin issues to enable safe use throughout her day. (Target Date: 07/17/2016)   Time 2   Period Months   Status On-going     PT LONG TERM GOAL #3   Title Patient ambulates 400' with LRAD & new prosthesis modified independent for community mobility. (Target Date: 07/17/2016)   Time 2   Period Months   Status On-going     PT LONG TERM GOAL #4   Title Patient negotiates ramps, curbs & stairs with LRAD & new prosthesis modified independent for community access. (Target Date: 07/17/2016)   Time 2   Period Months   Status On-going     PT LONG TERM GOAL #5   Title Patient ambulates around furniture 100' with LRAD (single UE support) and new prosthesis carrying cup of water modified independent. (Target Date: 07/17/2016)   Time 2   Period Months   Status On-going     PT LONG TERM GOAL #6   Title Berg Balance >/= 40/56 to indicate lower fall risk. (Target Date: 07/17/2016)   Baseline 2016-07-13: scored 36/56   Status Revised     PT LONG TERM GOAL #7   Title Timed Up & Go with LRAD (single UE support) & prosthesis <25sec to indicate lower fall risk. (Target Date: 07/17/2016)   Baseline 07-13-16: 29.03 sec's with RW   Status Revised       2016-07-13 1552  PT G-Codes  Functional Assessment Tool Used Merrilee Jansky Balance 36/56 & Timed  Up & Go with RW 29.03 sec  Functional Limitation Mobility: Walking and moving around  Mobility: Walking and Moving Around Current Status [phone]) CK  Mobility: Walking and Moving Around Goal Status [phone]) CJ    Jamey Reas, PT, DPT PT Specializing in Prosthetics & Orthotics 06/25/16 9:26 AM Phone:  [phone]  Fax:  [phone] [place] [address] [place], [place] [address]

## 2016-06-24 NOTE — Progress Notes (Signed)
Cardiology Office Note    Date:  06/29/2016   ID:  Cindy NgShirley L Habermann, DOB [birth year], MRN [id number]  PCP:  Astrid DivineGRIFFIN,[name] COLLINS, MD  Cardiologist:  Dr. Mayford Knifeurner  Chief Complaint: 4 weeks  follow up  History of Present Illness:   [name] is a 75 y.o. 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 Venetia MaxonStern) who presented for follow up.   [facility] pain and elevated troponin. She was recently awaiting MR of the brain to evaluate forbrain aneurysm progression before committing to definitive cardiac cath and possible antiplatelet therapy if required. Initial troponin was 0.29 and herEKG showednew ST-T changes compared to prior. Ronie Spiesayna Dunn spoke with Dr. Bevely Palmeritty, who [facility] after the pt had an MRI. He cleared the patient for cardiac catheterization and use of DAPT if necessary since heraneurysm is not acutely ruptured and there was an acute coronary syndrome occurring. Cath done 04/21/16 revealed a 75% DX1 lesion, moderate LVD 35-45% in pattern of Takotsubo CM, and a 55 mmHg LVOT gradient.  She was discharged home in stable condition on an ARB and BB. Discharge date was 04/23/16.   Last seen in clinic 05/12/16 by Robbie LisBrittainy Simmons 05/12/16. Her BP was elevated. Increased Losartan to 100mg  qd. Unable to titrate BB due to baseline low pulse.   Echo 06/10/16 showed improved LV function to 60-65%, no wm abnormality, grade 1 DD, mild MR, trivial aortic regurgitation, mild dilated LA, PA pressure of 31mm Hg. There was moderate focal hypertrophy of the basilar septum with a narrowed LV outflow tract and mild SAM. Thre was minimal LVOT gradient of 10mmHG. 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  Here today for follow up. Her weight has been in rage of 135-139lb. She feels short of breath when her when in 139lbs.  Usually her weight back to normal next week with out need of extra lasix. She takes lasix 20mg  qd. She has chronic orthopnea and uses 3 pillows chronically. One time she had ankle swelling when she missed lasix dose. No LE edema, orthopnea, PND, syncope, palpitations, dizziness or chest pain. Intermittent pain behind R ear. Occurs once every 2-3 weeks and resolved by if self in 2-3 hours without invermination. No associated dizziness, headache, ear pain, syncope or lock jaw. Not associated with chewing.   Past Medical History:  Diagnosis Date  . ACS (acute coronary syndrome) (HCC)   . Atypical chest pain    negative cardiolyte 09/2007 w Dr Mayford Knifeurner  . CAD in native artery    a. Cor CT 02/2016 - poor quality due to motion/poor filling of coronary arteries with tortuosity, 2 isolated foci in LM (nonobstructive), calcium score 23rd percentile, normal left dominant coronary arteries with noted motion artifact and poor distal vessel visualization.  . Dyslipidemia   . GERD (gastroesophageal reflux disease)   . Hypercholesteremia   . Hypertension   . NSTEMI (non-ST elevated myocardial infarction) (HCC) 04/2016  . PAC (premature atrial contraction)    Event monitors 10/2013 and 08/2014 showed NSR, ST, PACs, PVCs.   [name]. 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 Posterior communicating artery aneurysm    followed by Dr Venetia MaxonStern  . PVC's (premature ventricular contractions)    Event monitors 10/2013 and 08/2014 showed NSR, ST, PACs, PVCs.  Past Surgical History:  Procedure Laterality Date  . ABDOMINAL HYSTERECTOMY    . 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 CARDIAC CATHETERIZATION N/A 04/21/2016   Procedure: Left Heart Cath and Coronary Angiography;  Surgeon: Corky Crafts, MD;  Location: [facility] INVASIVE CV LAB;  Service: Cardiovascular;  Laterality: N/A;  . IR GENERIC HISTORICAL  02/13/2016   IR RADIOLOGY PERIPHERAL GUIDED  IV START 02/13/2016 Berdine Dance, MD [facility]  . IR GENERIC HISTORICAL  02/13/2016   IR US GUIDE VASC ACCESS LEFT 02/13/2016 Berdine Dance, MD [facility]    Current Medications:  Prior to Admission medications   Medication Sig Start Date End Date Taking? Authorizing Provider  acetaminophen (TYLENOL) 325 MG tablet Take 2 tablets (650 mg total) by mouth every 4 (four) hours as needed for headache or mild pain. 04/23/16  Yes Luke K Kilroy, PA-C  Ascorbic Acid (VITAMIN C) 100 MG tablet Take 100 mg by mouth daily.   Yes Historical Provider, MD  aspirin EC 81 MG EC tablet Take 1 tablet (81 mg total) by mouth daily. 04/23/16  Yes Luke K Kilroy, PA-C  atorvastatin (LIPITOR) 80 MG tablet Take 80 mg by mouth daily.   Yes Historical Provider, MD  calcium carbonate (OS-CAL) 600 MG TABS tablet Take 600 mg by mouth daily with breakfast.   Yes Historical Provider, MD  furosemide (LASIX) 20 MG tablet Take 1 tablet (20 mg total) by mouth daily as needed for fluid or edema. Patient taking differently: Take 20 mg by mouth daily.  04/23/16  Yes Luke K Kilroy, PA-C  gabapentin (NEURONTIN) 300 MG capsule Take 300 mg by mouth 3 (three) times daily.   Yes Historical Provider, MD  isosorbide mononitrate (IMDUR) 30 MG 24 hr tablet Take 0.5 tablets (15 mg total) by mouth daily. 04/23/16  Yes Luke K Kilroy, PA-C  losartan (COZAAR) 100 MG tablet Take 1 tablet (100 mg total) by mouth daily. 05/12/16  Yes Brittainy Sherlynn Carbon, PA-C  meloxicam (MOBIC) 7.5 MG tablet Take 7.5 mg by mouth as needed for pain (once or twice a day as needed for shoulder/neck pain).   Yes Historical Provider, MD  metoprolol succinate (TOPROL-XL) 25 MG 24 hr tablet Take 1 tablet (25 mg total) by mouth daily. 04/23/16  Yes Abelino Derrick, PA-C  Multiple Vitamin (MULTIVITAMIN) tablet Take 1 tablet by mouth daily.   Yes Historical Provider, MD  Omega-3 Fatty Acids (FISH OIL PO) Take 1 capsule by mouth daily.    Yes Historical Provider, MD    pantoprazole (PROTONIX) 40 MG tablet Take 1 tablet (40 mg total) by mouth daily. 04/23/16  Yes Luke K Kilroy, PA-C  QVAR 80 MCG/ACT inhaler Inhale 1 puff into the lungs as needed. For shortness of breath. 05/12/14  Yes Historical Provider, MD  SALINE NASAL SPRAY NA Place into the nose. 2 sprays per nostril every 3-4 hours for upper respatory maintenance   Yes Historical Provider, MD  vitamin B-12 (CYANOCOBALAMIN) 1000 MCG tablet Take 1,000 mcg by mouth daily.   Yes Historical Provider, MD   Allergies:   Codeine and Penicillins   Social History   Social History  . Marital status: Single    Spouse name: N/A  . Number of children: N/A  . Years of education: N/A   Social History Main Topics  . Smoking status: Former Smoker    Quit date: 07/13/2004  . Smokeless tobacco: Never Used  . Alcohol use Yes  Comment: ocassionally   . Drug use: No  . Sexual activity: Not on file   Other Topics Concern  . Not on file   Social History Narrative  . No narrative on file     Family History:  The patient's family history includes Heart attack in her brother, mother, and sister; Heart disease in her brother, mother, and sister.   ROS:   Please see the history of present illness.    ROS All other systems reviewed and are negative.   PHYSICAL EXAM:   VS:  BP 120/80   Pulse 68   Resp 18   Wt 138 lb (62.6 kg)   SpO2 98%   BMI 25.24 kg/m    GEN: Well nourished, well developed, in no acute distress  HEENT: normal  Neck: no JVD, carotid bruits, or masses Cardiac: RRR; no murmurs, rubs, or gallops,no edema  Respiratory:  clear to auscultation bilaterally, normal work of breathing GI: soft, nontender, nondistended, + BS MS: no deformity or atrophy  Skin: warm and dry, no rash Neuro:  Alert and Oriented x 3, Strength and sensation are intact Psych: euthymic mood, full affect  Wt Readings from Last 3 Encounters:  06/29/16 138 lb (62.6 kg)  05/12/16 134 lb 3.2 oz (60.9 kg)  04/23/16 128  lb 15.5 oz (58.5 kg)      Studies/Labs Reviewed:   EKG:  EKG is not  ordered today.    Recent Labs: 04/21/2016: ALT 15; Magnesium 2.0 04/23/2016: Hemoglobin 11.1; Platelets 142 05/12/2016: BUN 15; Creat 0.90; Potassium 4.0; Sodium 141   Lipid Panel    Component Value Date/Time   CHOL 176 04/22/2016 0017   TRIG 260 (H) 04/22/2016 0017   HDL 45 04/22/2016 0017   CHOLHDL 3.9 04/22/2016 0017   VLDL 52 (H) 04/22/2016 0017   LDLCALC 79 04/22/2016 0017    Additional studies/ records that were reviewed today include:   Echocardiogram: 06/10/16 LV EF: 60% -   65%  ------------------------------------------------------------------- Indications:      (I42.9).  ------------------------------------------------------------------- History:   PMH:  Cardiomyopathy. SAM. MR. PAC. PVC. NSTEMI. Former smoker. Acquired from the patient and from the patient&'s chart.  ------------------------------------------------------------------- Study Conclusions  - Left ventricle: The cavity size was normal. There was moderate   focal basal hypertrophy of the septum. Systolic function was   normal. The estimated ejection fraction was in the range of 60%   to 65%. Wall motion was normal; there were no regional wall   motion abnormalities. Doppler parameters are consistent with   abnormal left ventricular relaxation (grade 1 diastolic   dysfunction). - Aortic valve: There was trivial regurgitation. - Mitral valve: There was mild systolic anterior motion. There was   mild regurgitation. - Left atrium: The atrium was mildly dilated. - Pulmonary arteries: PA peak pressure: 31 mm Hg (S). - Impressions: There was moderate focal hypertrophy of the basilar   septum with a narrowed LV outflow tract and mild SAM. Thre was   minimal LVOT gradient of 10mmHG. 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  Impressions:  - There was moderate focal hypertrophy of the basilar septum with a   narrowed LV outflow tract and mild  SAM. Thre was minimal LVOT   gradient of 10mmHG. 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  Cardiac Catheterization: 04/21/16 Left Heart Cath and Coronary Angiography  Conclusion     Mid Cx lesion, 25 %stenosed.  Ost 1st Diag lesion, 75 %stenosed.  The left ventricular ejection fraction is 35-45% by visual estimate.  There is mild  to moderate left ventricular systolic dysfunction in a pattern of Takotsubo cardiomyopathy.  LV end diastolic pressure is mildly elevated.  50-55 mm Hg resting gradient across the LVOT, likely due to subvalvular stenosis. No difficulty crossing the aortic valve.  Severe tortuosity of the right subclavian making torquing catheters difficult. Consider left radial if cath needed in the future.   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  ACE-I in AM if renal function stable.  No clear source of stress that may have caused this episode.      ASSESSMENT & PLAN:    1. Cardiomyopathy:  - Cath 10/17 showed moderate LVD 35-45% in pattern of Takotsubo CM, and a 55 mmHg LVOT gradient. She is placed on  ARB,  BB and diuretic. Echo 06/10/16 showed improved LV function to 60-65%, no wm abnormality, grade 1 DD, mild MR, trivial aortic regurgitation, mild dilated LA, PA pressure of 31mm Hg. There was moderate focal hypertrophy of the basilar septum with a narrowed LV outflow tract and mild SAM. Thre was minimal LVOT gradient of . 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 - She is euvolemic on physical exam.  Continue low sodium diet. We discussed weight monitoring. Advised to take extra 1/2 pill of lasix (10mg ) if significant dyspnea or weight above 140lb.  - Continue metoprolol and Losartan.   2. CAD:  -  LHC showed mild-moderate, nonosbstructive CAD with a 75% stenosed 1st diagonal branch and 25% mid LCx, treated medically. She denies any recurrent CP. Continue ASA, statin, BB, ARB and Imdur.   3. HTN:  - Stable and well controlled on current regimen.   4. HLD - 04/22/2016:  Cholesterol 176; HDL 45; LDL Cholesterol 79; Triglycerides 260; VLDL 52  - Continue statin   5. Pain behind R ear - Intermittent. Stable. Without associated symptoms. No carotid bruit. Not associated with head movement. No syncope Try Tylenol. If worsen, f/u with PCP.    Medication Adjustments/Labs and Tests Ordered: Current medicines are reviewed at length with the patient today.  Concerns regarding medicines are outlined above.  Medication changes, Labs and Tests ordered today are listed in the Patient Instructions below. Patient Instructions  Medication Instructions:  Your physician recommends that you continue on your current medications as directed. Please refer to the Current Medication list given to you today.   Labwork: None Ordered   Testing/Procedures: None Ordered   Follow-Up: Your physician recommends that you schedule a follow-up appointment in: 3 months with Dr. Mayford Knife   If you need a refill on your cardiac medications before your next appointment, please call your pharmacy.   Thank you for choosing [facility]! Eligha Bridegroom, RN [phone]       Signed, Manson Passey, PA  06/29/2016 3:23 PM    [facility] [address] [place], [place], [place]  [address] Phone: [phone]; Fax: [phone]

## 2016-06-25 ENCOUNTER — Encounter: Payer: Self-pay | Admitting: Physical Therapy

## 2016-06-25 ENCOUNTER — Ambulatory Visit: Payer: Medicare Other | Admitting: Physical Therapy

## 2016-06-25 DIAGNOSIS — R2681 Unsteadiness on feet: Secondary | ICD-10-CM

## 2016-06-25 DIAGNOSIS — M25651 Stiffness of right hip, not elsewhere classified: Secondary | ICD-10-CM

## 2016-06-25 DIAGNOSIS — R2689 Other abnormalities of gait and mobility: Secondary | ICD-10-CM

## 2016-06-25 DIAGNOSIS — M6281 Muscle weakness (generalized): Secondary | ICD-10-CM

## 2016-06-26 NOTE — Therapy (Signed)
[facility] [address] [place], [place], [address] Phone: [phone]   Fax:  [phone]  Physical Therapy Treatment  Patient Details  Name: [name] MRN: [id number] Date of Birth: March 06, 1941 Referring Provider: Kelton Pillar, MD  Encounter Date: 06/25/2016      PT End of Session - 06/25/16 1450    Visit Number 11   Number of Visits 18   Date for PT Re-Evaluation 07/17/16   Authorization Type Medicare G-Code & progress note   PT Start Time 1447   PT Stop Time 1530   PT Time Calculation (min) 43 min   Equipment Utilized During Treatment Gait belt   Activity Tolerance Patient tolerated treatment well   Behavior During Therapy [facility] for tasks assessed/performed      Past Medical History:  Diagnosis Date  . ACS (acute coronary syndrome) ([place])   . Atypical chest pain    negative cardiolyte 09/2007 w Dr Radford Pax  . CAD in native artery    a. Cor CT 02/2016 - poor quality due to motion/poor filling of coronary arteries with tortuosity, 2 isolated foci in LM (nonobstructive), calcium score 23rd percentile, normal left dominant coronary arteries with noted motion artifact and poor distal vessel visualization.  . Dyslipidemia   . GERD (gastroesophageal reflux disease)   . Hypercholesteremia   . Hypertension   . NSTEMI (non-ST elevated myocardial infarction) ([name]) 04/2016  . PAC (premature atrial contraction)    Event monitors 10/2013 and 08/2014 showed NSR, ST, PACs, PVCs.   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 Posterior communicating artery aneurysm    followed by Dr Vertell Limber  . PVC's (premature ventricular contractions)    Event monitors 10/2013 and 08/2014 showed NSR, ST, PACs, PVCs.     Past Surgical History:  Procedure Laterality Date  . ABDOMINAL HYSTERECTOMY    . 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 CARDIAC  CATHETERIZATION N/A 04/21/2016   Procedure: Left Heart Cath and Coronary Angiography;  Surgeon: Jettie Booze, MD;  Location: [place] CV LAB;  Service: Cardiovascular;  Laterality: N/A;  . IR GENERIC HISTORICAL  02/13/2016   IR RADIOLOGY PERIPHERAL GUIDED IV START 02/13/2016 Greggory Keen, MD [facility]  . IR GENERIC HISTORICAL  02/13/2016   IR US GUIDE VASC ACCESS LEFT 02/13/2016 Greggory Keen, MD [facility]    There were no vitals filed for this visit.      Subjective Assessment - 06/25/16 1450    Subjective No new complaints. No falls to report or pain to report.    Pertinent History PAD, HTN, systolic HF, CAD, MI, Brain aneurysm   Limitations Standing;Walking   Patient Stated Goals Walk in house with home with prosthesis with nothing or cane and community with walker   Currently in Pain? No/denies   Pain Score 0-No pain            [facility] Adult PT Treatment/Exercise - 06/25/16 1451      Transfers   Transfers Sit to Stand;Stand to Sit   Sit to Stand 5: Supervision;With upper extremity assist;With armrests;From chair/3-in-1   Stand to Sit 5: Supervision;With upper extremity assist;With armrests;To chair/3-in-1     Ambulation/Gait   Ambulation/Gait Yes   Ambulation/Gait Assistance 5: Supervision;4: Min guard;4: Min assist  min assist with straight cane   Ambulation/Gait Assistance Details cues and manual facilitation for weight shifting  and correct pelvic alingment as pt tend to retract right hip, causing IR of left foot due to forward position of that side of her pelvis. Pt able to progress to self flexion of prosthetic knee with gait after instruction. Continues to need assist/cues for pelvic position with gait                           Ambulation Distance (Feet) 230 Feet  x1, 120 x1 with RW; 115  x1 cane   Assistive device Prosthesis;Rolling walker;Straight cane  with rubber tip   Gait Pattern Decreased step length - left;Decreased stance time - right;Decreased  stride length;Decreased hip/knee flexion - right;Decreased weight shift to right;Right hip hike;Right circumduction;Antalgic;Lateral hip instability;Trunk flexed;Abducted- right;Poor foot clearance - right;Step-through pattern   Ambulation Surface Level;Indoor   Ramp 5: Supervision  with RW/prosthesis   Ramp Details (indicate cue type and reason) cues on posture, step length and weight shifting   Curb 5: Supervision  with RW/prosthesis   Curb Details (indicate cue type and reason) cues for stance position with walker advancement and with ascending/descending for increased stability with weight shifting     Prosthetics   Current prosthetic wear tolerance (days/week)  every day   Current prosthetic wear tolerance (#hours/day)  wearing ~80% of her awake hours   Residual limb condition  No problems to report, per pt.   Education Provided Proper wear schedule/adjustment;Proper weight-bearing schedule/adjustment;Residual limb care   Person(s) Educated Patient   Education Method Explanation;Verbal cues;Demonstration   Education Method Verbalized understanding;Tactile cues required;Verbal cues required;Needs further instruction   Donning Prosthesis Supervision   Doffing Prosthesis Supervision             PT Short Term Goals - 06/18/16 1445      PT SHORT TERM GOAL #1   Title Patient tolerates wear of new prosthesis daily >8 hrs total without pain or limb issues. (Target Date: 06/18/2016)   Baseline MET 06/18/2016   Time 1   Period Months   Status Achieved     PT SHORT TERM GOAL #2   Title Patient verbalizes proper cleaning & demonstrates proper donning of new prosthesis. (Target Date: 06/18/2016)   Baseline MET 06/18/2016   Time 1   Period Months   Status Achieved     PT SHORT TERM GOAL #3   Title Patient ambulates 300' with RW & prosthesis with cues on prosthesis control. (Target Date: 06/18/2016)   Baseline MET 06/18/2016   Time 1   Period Months   Status Achieved     PT SHORT  TERM GOAL #4   Title Patient ambulates 52' with cane & prosthesis with minA. (Target Date: 06/18/2016)   Baseline MET 06/12/2016   Time 1   Period Months   Status Achieved           PT Long Term Goals - 06/23/16 1546      PT LONG TERM GOAL #1   Title Patient verbalizes & demonstrates understanding of prosthetic care with new prosthesis to enable safe use. (Target Date: 07/17/2016)   Time 2   Period Months   Status On-going     PT LONG TERM GOAL #2   Title Patient tolerates wear of new prosthesis >90% of awake hours without skin issues to enable safe use throughout her day. (Target Date: 07/17/2016)   Time 2   Period Months   Status On-going     PT LONG TERM GOAL #3   Title  Patient ambulates 400' with LRAD & new prosthesis modified independent for community mobility. (Target Date: 07/17/2016)   Time 2   Period Months   Status On-going     PT LONG TERM GOAL #4   Title Patient negotiates ramps, curbs & stairs with LRAD & new prosthesis modified independent for community access. (Target Date: 07/17/2016)   Time 2   Period Months   Status On-going     PT LONG TERM GOAL #5   Title Patient ambulates around furniture 100' with LRAD (single UE support) and new prosthesis carrying cup of water modified independent. (Target Date: 07/17/2016)   Time 2   Period Months   Status On-going     PT LONG TERM GOAL #6   Title Berg Balance >/= 40/56 to indicate lower fall risk. (Target Date: 07/17/2016)   Baseline 06/23/16: scored 36/56   Status Revised     PT LONG TERM GOAL #7   Title Timed Up & Go with LRAD (single UE support) & prosthesis <25sec to indicate lower fall risk. (Target Date: 07/17/2016)   Baseline 06/23/16: 29.03 sec's with RW   Status Revised           Plan - 06/25/16 1450    Clinical Impression Statement Today's skilled session focused on gait quality with prosthesis and RW/cane with emphasis on prosthetic knee flexion during swing phase of gait and on correct pelvic  positioning to prevent IR of left LE. Pt with good carryover of prosthetic knee flexion with swing phase after session, however needs continued work on pelvic positioning with gait. Pt is making steady progress toward goals and should benefit from continued PT to progress toward unmet goals.                                Rehab Potential Good   PT Frequency 2x / week   PT Duration Other (comment)  9 weeks (60 days)   PT Treatment/Interventions ADLs/Self Care Home Management;DME Instruction;Gait training;Stair training;Functional mobility training;Therapeutic activities;Therapeutic exercise;Balance training;Neuromuscular re-education;Patient/family education;Prosthetic Training   PT Next Visit Plan NMR for midline & balance, prosthetic gait towards LTGs, continue to work on pelvic positioning with gait for proper LE placement   Consulted and Agree with Plan of Care Patient      Patient will benefit from skilled therapeutic intervention in order to improve the following deficits and impairments:  Abnormal gait, Decreased activity tolerance, Decreased balance, Decreased endurance, Decreased knowledge of use of DME, Decreased range of motion, Decreased strength, Postural dysfunction, Prosthetic Dependency  Visit Diagnosis: Unsteadiness on feet  Other abnormalities of gait and mobility  Muscle weakness (generalized)  Stiffness of right hip, not elsewhere classified     Problem List Patient Active Problem List   Diagnosis Date Noted  . Chronic systolic HF (heart failure) (HCC) 05/12/2016  . ACS (acute coronary syndrome) (HCC) 04/21/2016  . NSTEMI (non-ST elevated myocardial infarction) (HCC) 04/21/2016  . Brain aneurysm 04/13/2016  . Hypertension   . Dyslipidemia   . CAD in native artery   . Chest pain 01/29/2016  . PAC (premature atrial contraction) 08/14/2014  . PVC's (premature ventricular contractions) 08/14/2014  . Palpitations 11/02/2013  . Hypercholesteremia   . Essential  hypertension, benign 11/01/2013  . Peripheral vascular disease, unspecified 11/01/2013    Sallyanne Kuster, PTA, [facility] [address] [place], [place] [address] [phone] 06/26/16, 4:52 PM   Name: LADEANA LAPLANT  MRN: [id number] Date of Birth: July 31, 1940

## 2016-06-29 ENCOUNTER — Ambulatory Visit (INDEPENDENT_AMBULATORY_CARE_PROVIDER_SITE_OTHER): Payer: Medicare Other | Admitting: Physician Assistant

## 2016-06-29 VITALS — BP 120/80 | HR 68 | Resp 18 | Wt 138.0 lb

## 2016-06-29 DIAGNOSIS — I10 Essential (primary) hypertension: Secondary | ICD-10-CM | POA: Diagnosis not present

## 2016-06-29 DIAGNOSIS — I2510 Atherosclerotic heart disease of native coronary artery without angina pectoris: Secondary | ICD-10-CM

## 2016-06-29 DIAGNOSIS — E785 Hyperlipidemia, unspecified: Secondary | ICD-10-CM | POA: Diagnosis not present

## 2016-06-29 DIAGNOSIS — I5181 Takotsubo syndrome: Secondary | ICD-10-CM

## 2016-06-29 DIAGNOSIS — I671 Cerebral aneurysm, nonruptured: Secondary | ICD-10-CM | POA: Diagnosis not present

## 2016-06-29 NOTE — Patient Instructions (Signed)
Medication Instructions:  Your physician recommends that you continue on your current medications as directed. Please refer to the Current Medication list given to you today.   Labwork: None Ordered   Testing/Procedures: None Ordered   Follow-Up: Your physician recommends that you schedule a follow-up appointment in: 3 months with Dr. Turner   If you need a refill on your cardiac medications before your next appointment, please call your pharmacy.   Thank you for choosing [facility]!  , RN [phone]    

## 2016-06-30 ENCOUNTER — Ambulatory Visit: Payer: Medicare Other | Admitting: Physical Therapy

## 2016-06-30 ENCOUNTER — Encounter: Payer: Self-pay | Admitting: Physical Therapy

## 2016-06-30 DIAGNOSIS — M25651 Stiffness of right hip, not elsewhere classified: Secondary | ICD-10-CM

## 2016-06-30 DIAGNOSIS — R2681 Unsteadiness on feet: Secondary | ICD-10-CM

## 2016-06-30 DIAGNOSIS — M6281 Muscle weakness (generalized): Secondary | ICD-10-CM

## 2016-06-30 DIAGNOSIS — R2689 Other abnormalities of gait and mobility: Secondary | ICD-10-CM

## 2016-06-30 NOTE — Therapy (Signed)
[facility] [address] [place], [place], [address] Phone: [phone]   Fax:  [phone]  Physical Therapy Treatment  Patient Details  Name: [name] MRN: [id number] Date of Birth: 02-03-1941 Referring Provider: Kelton Pillar, MD  Encounter Date: 06/30/2016      PT End of Session - 06/30/16 1405    Visit Number 12   Number of Visits 18   Date for PT Re-Evaluation 07/17/16   Authorization Type Medicare G-Code & progress note   PT Start Time 1402   PT Stop Time 1443   PT Time Calculation (min) 41 min   Equipment Utilized During Treatment Gait belt   Activity Tolerance Patient tolerated treatment well   Behavior During Therapy [facility] for tasks assessed/performed      Past Medical History:  Diagnosis Date  . ACS (acute coronary syndrome) ([place])   . Atypical chest pain    negative cardiolyte 09/2007 w Dr Radford Pax  . CAD in native artery    a. Cor CT 02/2016 - poor quality due to motion/poor filling of coronary arteries with tortuosity, 2 isolated foci in LM (nonobstructive), calcium score 23rd percentile, normal left dominant coronary arteries with noted motion artifact and poor distal vessel visualization.  . Dyslipidemia   . GERD (gastroesophageal reflux disease)   . Hypercholesteremia   . Hypertension   . NSTEMI (non-ST elevated myocardial infarction) ([place]) 04/2016  . PAC (premature atrial contraction)    Event monitors 10/2013 and 08/2014 showed NSR, ST, PACs, PVCs.   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 Posterior communicating artery aneurysm    followed by Dr Vertell Limber  . PVC's (premature ventricular contractions)    Event monitors 10/2013 and 08/2014 showed NSR, ST, PACs, PVCs.     Past Surgical History:  Procedure Laterality Date  . ABDOMINAL HYSTERECTOMY    . 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 CARDIAC  CATHETERIZATION N/A 04/21/2016   Procedure: Left Heart Cath and Coronary Angiography;  Surgeon: Jettie Booze, MD;  Location: [facility];  Service: Cardiovascular;  Laterality: N/A;  . IR GENERIC HISTORICAL  02/13/2016   IR RADIOLOGY PERIPHERAL GUIDED IV START 02/13/2016 Greggory Keen, MD [facility]  . IR GENERIC HISTORICAL  02/13/2016   IR US GUIDE VASC ACCESS LEFT 02/13/2016 Greggory Keen, MD [facility]    There were no vitals filed for this visit.      Subjective Assessment - 06/30/16 1405    Subjective No new complaints. No falls to report or pain to report.    Limitations Standing;Walking   Patient Stated Goals Walk in house with home with prosthesis with nothing or cane and community with walker   Currently in Pain? No/denies   Pain Score 0-No pain           [facility] Adult PT Treatment/Exercise - 06/30/16 1405      Transfers   Transfers Sit to Stand;Stand to Sit   Sit to Stand 5: Supervision;With upper extremity assist;From chair/3-in-1;With armrests   Stand to Sit 5: Supervision;With upper extremity assist;To chair/3-in-1;With armrests     Ambulation/Gait   Ambulation/Gait Yes   Ambulation/Gait Assistance 4: Min guard;4: Min assist   Ambulation/Gait Assistance Details cues on posture, increased left step length, increased right (prosthetic) knee bending with swing phase and for increased stance time/weight shifting onto right side in stance  Ambulation Distance (Feet) 220 Feet  x2, 115 x1 (with ramp/curb included)   Assistive device Straight cane;Prosthesis  cane with rubber quad tip   Gait Pattern Decreased step length - left;Decreased stance time - right;Decreased stride length;Decreased hip/knee flexion - right;Decreased weight shift to right;Right hip hike;Right circumduction;Antalgic;Lateral hip instability;Trunk flexed;Abducted- right;Poor foot clearance - right;Step-through pattern   Ambulation Surface Level;Indoor   Ramp 4: Min assist  with  cane/prosthesis   Ramp Details (indicate cue type and reason) x 2 reps with cues for sequencing and technique   Curb 4: Min assist  with cane/prosthesis   Curb Details (indicate cue type and reason) x 2 reps with cues on sequencing and technique     Prosthetics   Prosthetic Care Comments  Sometimes pt will put sleeve on and if it feels tight will wear it awhile before putting on prosthsis   Current prosthetic wear tolerance (days/week)  daily   Current prosthetic wear tolerance (#hours/day)  wearing ~80% of her awake hours   Residual limb condition  No problems to report, per pt.   Donning Prosthesis Supervision   Doffing Prosthesis Supervision             PT Short Term Goals - 06/18/16 1445      PT SHORT TERM GOAL #1   Title Patient tolerates wear of new prosthesis daily >8 hrs total without pain or limb issues. (Target Date: 06/18/2016)   Baseline MET 06/18/2016   Time 1   Period Months   Status Achieved     PT SHORT TERM GOAL #2   Title Patient verbalizes proper cleaning & demonstrates proper donning of new prosthesis. (Target Date: 06/18/2016)   Baseline MET 06/18/2016   Time 1   Period Months   Status Achieved     PT SHORT TERM GOAL #3   Title Patient ambulates 300' with RW & prosthesis with cues on prosthesis control. (Target Date: 06/18/2016)   Baseline MET 06/18/2016   Time 1   Period Months   Status Achieved     PT SHORT TERM GOAL #4   Title Patient ambulates 68' with cane & prosthesis with minA. (Target Date: 06/18/2016)   Baseline MET 06/12/2016   Time 1   Period Months   Status Achieved           PT Long Term Goals - 06/23/16 1546      PT LONG TERM GOAL #1   Title Patient verbalizes & demonstrates understanding of prosthetic care with new prosthesis to enable safe use. (Target Date: 07/17/2016)   Time 2   Period Months   Status On-going     PT LONG TERM GOAL #2   Title Patient tolerates wear of new prosthesis >90% of awake hours without skin issues  to enable safe use throughout her day. (Target Date: 07/17/2016)   Time 2   Period Months   Status On-going     PT LONG TERM GOAL #3   Title Patient ambulates 400' with LRAD & new prosthesis modified independent for community mobility. (Target Date: 07/17/2016)   Time 2   Period Months   Status On-going     PT LONG TERM GOAL #4   Title Patient negotiates ramps, curbs & stairs with LRAD & new prosthesis modified independent for community access. (Target Date: 07/17/2016)   Time 2   Period Months   Status On-going     PT LONG TERM GOAL #5   Title Patient ambulates around furniture 100' with LRAD (single  UE support) and new prosthesis carrying cup of water modified independent. (Target Date: 07/17/2016)   Time 2   Period Months   Status On-going     PT LONG TERM GOAL #6   Title Berg Balance >/= 40/56 to indicate lower fall risk. (Target Date: 07/17/2016)   Baseline 06/23/16: scored 36/56   Status Revised     PT LONG TERM GOAL #7   Title Timed Up & Go with LRAD (single UE support) & prosthesis <25sec to indicate lower fall risk. (Target Date: 07/17/2016)   Baseline 06/23/16: 29.03 sec's with RW   Status Revised            Plan - 06/30/16 1405    Clinical Impression Statement Today's session continued to focus on gait/barriers with cane. Pt is making steady progress. Pt does continue to demo rotation at the hip/pelvis needing cues to correct this. Pt should benefit from continued PT to progress toward goals not met.                    Rehab Potential Good   PT Frequency 2x / week   PT Duration Other (comment)  9 weeks (60 days)   PT Treatment/Interventions ADLs/Self Care Home Management;DME Instruction;Gait training;Stair training;Functional mobility training;Therapeutic activities;Therapeutic exercise;Balance training;Neuromuscular re-education;Patient/family education;Prosthetic Training   PT Next Visit Plan NMR for midline & balance, prosthetic gait towards LTGs, continue to work on  pelvic positioning with gait for proper LE placement   Consulted and Agree with Plan of Care Patient      Patient will benefit from skilled therapeutic intervention in order to improve the following deficits and impairments:  Abnormal gait, Decreased activity tolerance, Decreased balance, Decreased endurance, Decreased knowledge of use of DME, Decreased range of motion, Decreased strength, Postural dysfunction, Prosthetic Dependency  Visit Diagnosis: Unsteadiness on feet  Other abnormalities of gait and mobility  Muscle weakness (generalized)  Stiffness of right hip, not elsewhere classified     Problem List Patient Active Problem List   Diagnosis Date Noted  . Chronic systolic HF (heart failure) ([place]) 05/12/2016  . ACS (acute coronary syndrome) ([place]) 04/21/2016  . NSTEMI (non-ST elevated myocardial infarction) ([place]) 04/21/2016  . Brain aneurysm 04/13/2016  . Hypertension   . Dyslipidemia   . CAD in native artery   . Chest pain 01/29/2016  . PAC (premature atrial contraction) 08/14/2014  . PVC's (premature ventricular contractions) 08/14/2014  . Palpitations 11/02/2013  . Hypercholesteremia   . Essential hypertension, benign 11/01/2013  . Peripheral vascular disease, unspecified 11/01/2013    Willow Ora, PTA, [facility] [address], [place], [place] [address] [phone] 06/30/16, 11:49 PM   Name: [name] MRN: [id number] Date of Birth: [birth year]

## 2016-07-01 ENCOUNTER — Encounter

## 2016-07-01 ENCOUNTER — Encounter: Payer: Self-pay | Admitting: Physical Therapy

## 2016-07-01 ENCOUNTER — Ambulatory Visit: Payer: Medicare Other | Admitting: Physical Therapy

## 2016-07-01 DIAGNOSIS — M6281 Muscle weakness (generalized): Secondary | ICD-10-CM

## 2016-07-01 DIAGNOSIS — R2681 Unsteadiness on feet: Secondary | ICD-10-CM

## 2016-07-01 DIAGNOSIS — R2689 Other abnormalities of gait and mobility: Secondary | ICD-10-CM

## 2016-07-01 DIAGNOSIS — M25651 Stiffness of right hip, not elsewhere classified: Secondary | ICD-10-CM

## 2016-07-01 MED ORDER — TELMISARTAN-HYDROCHLOROTHIAZIDE 80 MG-25 MG TAB
80-25 mg | ORAL_TABLET | ORAL | 5 refills | Status: DC
Start: 2016-07-01 — End: 2017-05-17

## 2016-07-01 NOTE — Therapy (Signed)
[facility] [address] [place], [place], [address] Phone: [phone]   Fax:  [phone]  Physical Therapy Treatment  Patient Details  Name: [name] MRN: [id number] Date of Birth: February 26, 1941 Referring Provider: Kelton Pillar, MD  Encounter Date: 07/01/2016      PT End of Session - 07/01/16 1448    Visit Number 13   Number of Visits 18   Date for PT Re-Evaluation 07/17/16   Authorization Type Medicare G-Code & progress note   PT Start Time 1401   PT Stop Time 1448   PT Time Calculation (min) 47 min   Equipment Utilized During Treatment Gait belt   Activity Tolerance Patient tolerated treatment well   Behavior During Therapy [facility] for tasks assessed/performed      Past Medical History:  Diagnosis Date  . ACS (acute coronary syndrome) ([place])   . Atypical chest pain    negative cardiolyte 09/2007 w Dr Radford Pax  . CAD in native artery    a. Cor CT 02/2016 - poor quality due to motion/poor filling of coronary arteries with tortuosity, 2 isolated foci in LM (nonobstructive), calcium score 23rd percentile, normal left dominant coronary arteries with noted motion artifact and poor distal vessel visualization.  . Dyslipidemia   . GERD (gastroesophageal reflux disease)   . Hypercholesteremia   . Hypertension   . NSTEMI (non-ST elevated myocardial infarction) ([place]) 04/2016  . PAC (premature atrial contraction)    Event monitors 10/2013 and 08/2014 showed NSR, ST, PACs, PVCs.   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 Posterior communicating artery aneurysm    followed by Dr Vertell Limber  . PVC's (premature ventricular contractions)    Event monitors 10/2013 and 08/2014 showed NSR, ST, PACs, PVCs.     Past Surgical History:  Procedure Laterality Date  . ABDOMINAL HYSTERECTOMY    . 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 CARDIAC  CATHETERIZATION N/A 04/21/2016   Procedure: Left Heart Cath and Coronary Angiography;  Surgeon: Jettie Booze, MD;  Location: [facility];  Service: Cardiovascular;  Laterality: N/A;  . IR GENERIC HISTORICAL  02/13/2016   IR RADIOLOGY PERIPHERAL GUIDED IV START 02/13/2016 Greggory Keen, MD [facility]  . IR GENERIC HISTORICAL  02/13/2016   IR US GUIDE VASC ACCESS LEFT 02/13/2016 Greggory Keen, MD [facility]    There were no vitals filed for this visit.      Subjective Assessment - 07/01/16 1425    Subjective Reports standing and using the walker for most household tasks.   Limitations Standing;Walking   Patient Stated Goals Walk in house with home with prosthesis with nothing or cane and community with walker   Currently in Pain? Yes   Pain Score 6    Pain Location Leg   Pain Orientation Right   Pain Descriptors / Indicators Aching   Pain Type Phantom pain   Pain Onset More than a month ago   Pain Frequency Intermittent                         [facility] Adult PT Treatment/Exercise - 07/01/16 0001      Transfers   Transfers Sit to Stand;Stand to Sit   Sit to Stand 5: Supervision;With upper extremity assist;From chair/3-in-1;With armrests   Sit to Stand Details (indicate cue type and reason) cues to  completely turn before sitting vs. reaching out with hand to sitting surface before turning   Stand to Sit 5: Supervision;With upper extremity assist;To chair/3-in-1;With armrests     Ambulation/Gait   Ambulation/Gait Yes   Ambulation/Gait Assistance 4: Min guard;4: Min assist  LOB x1 due to right knee not locking   Ambulation/Gait Assistance Details cues on posture, increased left step length, increased right (prosthetic) knee bending with swing phase and for increased stance time/weight shifting onto right side in stance   Ambulation Distance (Feet) 115 Feet   Assistive device Straight cane;Prosthesis  with quad tip   Gait Pattern Decreased step length -  left;Decreased stance time - right;Decreased stride length;Decreased hip/knee flexion - right;Decreased weight shift to right;Right hip hike;Right circumduction;Antalgic;Lateral hip instability;Trunk flexed;Abducted- right;Poor foot clearance - right;Step-through pattern   Ambulation Surface Level;Indoor   Stairs Yes   Ramp 4: Min assist   Ramp Details (indicate cue type and reason) x 2 reps with cues for sequencing and technique   Curb 4: Min assist   Curb Details (indicate cue type and reason) x 2 reps with cues on sequencing and technique   Pre-Gait Activities Worked on stepping strategies with SPC for stepping forward with Left foot emphasizing steplenght, weight shift and foot placement, required min As     Prosthetics   Prosthetic Care Comments  Sometimes pt will put sleeve on and if it feels tight will wear it awhile before putting on prosthsis   Current prosthetic wear tolerance (days/week)  daily   Current prosthetic wear tolerance (#hours/day)  wearing ~80% of her awake hours   Residual limb condition  No problems to report, per pt.                  PT Short Term Goals - 06/18/16 1445      PT SHORT TERM GOAL #1   Title Patient tolerates wear of new prosthesis daily >8 hrs total without pain or limb issues. (Target Date: 06/18/2016)   Baseline MET 06/18/2016   Time 1   Period Months   Status Achieved     PT SHORT TERM GOAL #2   Title Patient verbalizes proper cleaning & demonstrates proper donning of new prosthesis. (Target Date: 06/18/2016)   Baseline MET 06/18/2016   Time 1   Period Months   Status Achieved     PT SHORT TERM GOAL #3   Title Patient ambulates 300' with RW & prosthesis with cues on prosthesis control. (Target Date: 06/18/2016)   Baseline MET 06/18/2016   Time 1   Period Months   Status Achieved     PT SHORT TERM GOAL #4   Title Patient ambulates 44' with cane & prosthesis with minA. (Target Date: 06/18/2016)   Baseline MET 06/12/2016   Time 1    Period Months   Status Achieved           PT Long Term Goals - 06/23/16 1546      PT LONG TERM GOAL #1   Title Patient verbalizes & demonstrates understanding of prosthetic care with new prosthesis to enable safe use. (Target Date: 07/17/2016)   Time 2   Period Months   Status On-going     PT LONG TERM GOAL #2   Title Patient tolerates wear of new prosthesis >90% of awake hours without skin issues to enable safe use throughout her day. (Target Date: 07/17/2016)   Time 2   Period Months   Status On-going     PT LONG TERM  GOAL #3   Title Patient ambulates 400' with LRAD & new prosthesis modified independent for community mobility. (Target Date: 07/17/2016)   Time 2   Period Months   Status On-going     PT LONG TERM GOAL #4   Title Patient negotiates ramps, curbs & stairs with LRAD & new prosthesis modified independent for community access. (Target Date: 07/17/2016)   Time 2   Period Months   Status On-going     PT LONG TERM GOAL #5   Title Patient ambulates around furniture 100' with LRAD (single UE support) and new prosthesis carrying cup of water modified independent. (Target Date: 07/17/2016)   Time 2   Period Months   Status On-going     PT LONG TERM GOAL #6   Title Berg Balance >/= 40/56 to indicate lower fall risk. (Target Date: 07/17/2016)   Baseline 06/23/16: scored 36/56   Status Revised     PT LONG TERM GOAL #7   Title Timed Up & Go with LRAD (single UE support) & prosthesis <25sec to indicate lower fall risk. (Target Date: 07/17/2016)   Baseline 06/23/16: 29.03 sec's with RW   Status Revised               Plan - 07/01/16 1602    Clinical Impression Statement Pt required Min A with gait, stepping strategies and community barriers using SPC continuing to have difficulty appropriately shifting and maintaining weight on prosthesis.     Rehab Potential Good   PT Frequency 2x / week   PT Duration Other (comment)  9 weeks (60 days)   PT Treatment/Interventions  ADLs/Self Care Home Management;DME Instruction;Gait training;Stair training;Functional mobility training;Therapeutic activities;Therapeutic exercise;Balance training;Neuromuscular re-education;Patient/family education;Prosthetic Training   PT Next Visit Plan NMR for midline & balance, prosthetic gait towards LTGs, continue to work on pelvic positioning with gait for proper LE placement   Consulted and Agree with Plan of Care Patient      Patient will benefit from skilled therapeutic intervention in order to improve the following deficits and impairments:  Abnormal gait, Decreased activity tolerance, Decreased balance, Decreased endurance, Decreased knowledge of use of DME, Decreased range of motion, Decreased strength, Postural dysfunction, Prosthetic Dependency  Visit Diagnosis: Unsteadiness on feet  Other abnormalities of gait and mobility  Muscle weakness (generalized)  Stiffness of right hip, not elsewhere classified     Problem List Patient Active Problem List   Diagnosis Date Noted  . Chronic systolic HF (heart failure) ([place]) 05/12/2016  . ACS (acute coronary syndrome) ([place]) 04/21/2016  . NSTEMI (non-ST elevated myocardial infarction) ([place]) 04/21/2016  . Brain aneurysm 04/13/2016  . Hypertension   . Dyslipidemia   . CAD in native artery   . Chest pain 01/29/2016  . PAC (premature atrial contraction) 08/14/2014  . PVC's (premature ventricular contractions) 08/14/2014  . Palpitations 11/02/2013  . Hypercholesteremia   . Essential hypertension, benign 11/01/2013  . Peripheral vascular disease, unspecified 11/01/2013    Bjorn Loser, PTA  07/01/16, 4:06 PM [facility] [address] [place], [place], [address] Phone: [phone]   Fax:  [phone]  Name: [name] MRN: [id number] Date of Birth: 23-May-1941

## 2016-07-03 ENCOUNTER — Telehealth: Payer: Self-pay | Admitting: *Deleted

## 2016-07-03 DIAGNOSIS — R931 Abnormal findings on diagnostic imaging of heart and coronary circulation: Secondary | ICD-10-CM

## 2016-07-03 NOTE — Progress Notes (Signed)
please arrange this and ask this questions to the patient?

## 2016-07-03 NOTE — Telephone Encounter (Signed)
-----   Message from SunizonaBhavinkumar Bhagat, [place] sent at 07/03/2016  8:20 AM EST -----   ----- Message ----- From: Quintella Reichertraci R Turner, MD Sent: 07/02/2016   9:19 PM To: Manson PasseyBhavinkumar Bhagat, PA  Please order a cardiac MRI with gadolineum to assess further.  Please also find out if any of her relatives have a history of SCD or is she has evere had syncope  Traci ----- Message ----- From: Manson PasseyBhavinkumar Bhagat, PA Sent: 06/29/2016   3:26 PM To: Quintella Reichertraci R Turner, MD  Seem one of your patient today. 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 Does she required any additional study?

## 2016-07-03 NOTE — Telephone Encounter (Signed)
Called pt.  She has been made aware of her echo results and that Dr. Mayford Knifeurner wants a cardiac mri.    Order has been put in and pt aware that someone will call her to get it scheduled.  Pt verbalized understanding.

## 2016-07-08 ENCOUNTER — Encounter: Payer: Self-pay | Admitting: Physical Therapy

## 2016-07-08 ENCOUNTER — Ambulatory Visit: Payer: Medicare Other | Admitting: Physical Therapy

## 2016-07-08 DIAGNOSIS — M6281 Muscle weakness (generalized): Secondary | ICD-10-CM

## 2016-07-08 DIAGNOSIS — R2689 Other abnormalities of gait and mobility: Secondary | ICD-10-CM

## 2016-07-08 DIAGNOSIS — R2681 Unsteadiness on feet: Secondary | ICD-10-CM

## 2016-07-08 DIAGNOSIS — M25651 Stiffness of right hip, not elsewhere classified: Secondary | ICD-10-CM

## 2016-07-08 NOTE — Therapy (Signed)
[facility] [address] [place], [place], [address] Phone: [phone]   Fax:  [phone]  Physical Therapy Treatment  Patient Details  Name: [name] MRN: [id number] Date of Birth: Aug 31, 1940 Referring Provider: Kelton Pillar, MD  Encounter Date: 07/08/2016      PT End of Session - 07/08/16 1450    Visit Number 14   Number of Visits 18   Date for PT Re-Evaluation 07/17/16   Authorization Type Medicare G-Code & progress note   PT Start Time 1403   PT Stop Time 1448   PT Time Calculation (min) 45 min   Equipment Utilized During Treatment Gait belt   Activity Tolerance Patient tolerated treatment well   Behavior During Therapy [facility] for tasks assessed/performed      Past Medical History:  Diagnosis Date  . ACS (acute coronary syndrome) ([place])   . Atypical chest pain    negative cardiolyte 09/2007 w Dr Radford Pax  . CAD in native artery    a. Cor CT 02/2016 - poor quality due to motion/poor filling of coronary arteries with tortuosity, 2 isolated foci in LM (nonobstructive), calcium score 23rd percentile, normal left dominant coronary arteries with noted motion artifact and poor distal vessel visualization.  . Dyslipidemia   . GERD (gastroesophageal reflux disease)   . Hypercholesteremia   . Hypertension   . NSTEMI (non-ST elevated myocardial infarction) ([place]) 04/2016  . PAC (premature atrial contraction)    Event monitors 10/2013 and 08/2014 showed NSR, ST, PACs, PVCs.   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 Posterior communicating artery aneurysm    followed by Dr Vertell Limber  . PVC's (premature ventricular contractions)    Event monitors 10/2013 and 08/2014 showed NSR, ST, PACs, PVCs.     Past Surgical History:  Procedure Laterality Date  . ABDOMINAL HYSTERECTOMY    . 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 CARDIAC  CATHETERIZATION N/A 04/21/2016   Procedure: Left Heart Cath and Coronary Angiography;  Surgeon: Jettie Booze, MD;  Location: [facility];  Service: Cardiovascular;  Laterality: N/A;  . IR GENERIC HISTORICAL  02/13/2016   IR RADIOLOGY PERIPHERAL GUIDED IV START 02/13/2016 Greggory Keen, MD [facility]  . IR GENERIC HISTORICAL  02/13/2016   IR US GUIDE VASC ACCESS LEFT 02/13/2016 Greggory Keen, MD [facility]    There were no vitals filed for this visit.      Subjective Assessment - 07/08/16 1422    Subjective Reports that she is walking with the new prosthesis more than she thought she would and more than with the old prosthesis.   Limitations Standing;Walking   Patient Stated Goals Walk in house with home with prosthesis with nothing or cane and community with walker   Currently in Pain? Yes   Pain Score 7    Pain Location Leg   Pain Orientation Right   Pain Descriptors / Indicators Throbbing   Pain Type Chronic pain  with weight bearing   Pain Onset More than a month ago   Pain Frequency Intermittent                         [facility] Adult PT Treatment/Exercise - 07/08/16 0001      Transfers   Transfers Sit to Stand;Stand to Sit   Sit to Stand 6: Modified independent (Device/Increase  time)  with RW     Ambulation/Gait   Ambulation/Gait Yes   Ambulation/Gait Assistance 5: Supervision   Ambulation/Gait Assistance Details cues for weight shifting to the right, advancing right hip forward during stance phase, upright gaze  included negotiating barrier with RW cues for safe technique.   Ambulation Distance (Feet) 400 Feet  total but required seated rest x1 reporting residual limb was throbbing and fatigued.   Assistive device Rolling walker   Gait Pattern Decreased step length - left;Decreased stance time - right;Decreased stride length;Decreased hip/knee flexion - right;Decreased weight shift to right;Right hip hike;Right circumduction;Antalgic;Lateral  hip instability;Trunk flexed;Abducted- right;Poor foot clearance - right;Step-through pattern   Ambulation Surface Level;Indoor   Stairs Yes   Stairs Assistance 6: Modified independent (Device/Increase time)   Stair Management Technique Two rails;Step to pattern;Forwards   Number of Stairs 4   Ramp 5: Supervision   Ramp Details (indicate cue type and reason) cues to keep hips pointed straight ahead vs rotated to the right.   Curb 5: Supervision   Curb Details (indicate cue type and reason) demonstrated safe technique     Prosthetics   Prosthetic Care Comments  Sometimes pt will put sleeve on and if it feels tight will wear it awhile before putting on prosthsis   Current prosthetic wear tolerance (days/week)  daily   Current prosthetic wear tolerance (#hours/day)  wearing ~80% of her awake hours; 11am-6pm   Residual limb condition  No problems to report, per pt.   Donning Prosthesis Modified independent (device/increased time)   Doffing Prosthesis Modified independent (device/increased time)             Balance Exercises - 07/08/16 1555      Balance Exercises: Standing   Standing Eyes Opened Wide (BOA)  Used mirror and manual feedback working on weight shifting to the right and forward hip rotation.   Sit to Stand Time in parallel bars on stool: worked on shifting weight to the right progressing to sit<>stand with 1 UE support to attempting no UE support with min A.             PT Short Term Goals - 06/18/16 1445      PT SHORT TERM GOAL #1   Title Patient tolerates wear of new prosthesis daily >8 hrs total without pain or limb issues. (Target Date: 06/18/2016)   Baseline MET 06/18/2016   Time 1   Period Months   Status Achieved     PT SHORT TERM GOAL #2   Title Patient verbalizes proper cleaning & demonstrates proper donning of new prosthesis. (Target Date: 06/18/2016)   Baseline MET 06/18/2016   Time 1   Period Months   Status Achieved     PT SHORT TERM GOAL #3    Title Patient ambulates 300' with RW & prosthesis with cues on prosthesis control. (Target Date: 06/18/2016)   Baseline MET 06/18/2016   Time 1   Period Months   Status Achieved     PT SHORT TERM GOAL #4   Title Patient ambulates 1' with cane & prosthesis with minA. (Target Date: 06/18/2016)   Baseline MET 06/12/2016   Time 1   Period Months   Status Achieved           PT Long Term Goals - 06/23/16 1546      PT LONG TERM GOAL #1   Title Patient verbalizes & demonstrates understanding of prosthetic care with new prosthesis to enable safe use. (Target Date: 07/17/2016)   Time  2   Period Months   Status On-going     PT LONG TERM GOAL #2   Title Patient tolerates wear of new prosthesis >90% of awake hours without skin issues to enable safe use throughout her day. (Target Date: 07/17/2016)   Time 2   Period Months   Status On-going     PT LONG TERM GOAL #3   Title Patient ambulates 400' with LRAD & new prosthesis modified independent for community mobility. (Target Date: 07/17/2016)   Time 2   Period Months   Status On-going     PT LONG TERM GOAL #4   Title Patient negotiates ramps, curbs & stairs with LRAD & new prosthesis modified independent for community access. (Target Date: 07/17/2016)   Time 2   Period Months   Status On-going     PT LONG TERM GOAL #5   Title Patient ambulates around furniture 100' with LRAD (single UE support) and new prosthesis carrying cup of water modified independent. (Target Date: 07/17/2016)   Time 2   Period Months   Status On-going     PT LONG TERM GOAL #6   Title Berg Balance >/= 40/56 to indicate lower fall risk. (Target Date: 07/17/2016)   Baseline 06/23/16: scored 36/56   Status Revised     PT LONG TERM GOAL #7   Title Timed Up & Go with LRAD (single UE support) & prosthesis <25sec to indicate lower fall risk. (Target Date: 07/17/2016)   Baseline 06/23/16: 29.03 sec's with RW   Status Revised               Plan - 07/08/16 1558     Clinical Impression Statement Pt continues to need a seated rest for community gait distances around 250 ft.  Pt is able to demonstrate safe community obstacle negotiation at supervision level with RW.  Pt is able to follow verbal/manual cues for correct weight shifting to the right and correct hip position during gait but has not demonstrated carry over from session to session causing concern about consistenly locking knee for safety when using a SPC.                                                         Rehab Potential Good   PT Frequency 2x / week   PT Duration Other (comment)  9 weeks (60 days)   PT Treatment/Interventions ADLs/Self Care Home Management;DME Instruction;Gait training;Stair training;Functional mobility training;Therapeutic activities;Therapeutic exercise;Balance training;Neuromuscular re-education;Patient/family education;Prosthetic Training   PT Next Visit Plan Discuss scheduling and D/C; NMR for midline & balance, prosthetic gait towards LTGs, continue to work on pelvic positioning with gait for proper LE placement   Consulted and Agree with Plan of Care Patient      Patient will benefit from skilled therapeutic intervention in order to improve the following deficits and impairments:  Abnormal gait, Decreased activity tolerance, Decreased balance, Decreased endurance, Decreased knowledge of use of DME, Decreased range of motion, Decreased strength, Postural dysfunction, Prosthetic Dependency  Visit Diagnosis: Unsteadiness on feet  Other abnormalities of gait and mobility  Muscle weakness (generalized)  Stiffness of right hip, not elsewhere classified     Problem List Patient Active Problem List   Diagnosis Date Noted  . Chronic systolic HF (heart failure) ([place]) 05/12/2016  . ACS (acute coronary syndrome) ([place])  04/21/2016  . NSTEMI (non-ST elevated myocardial infarction) ([place]) 04/21/2016  . Brain aneurysm 04/13/2016  . Hypertension   . Dyslipidemia   . CAD in  native artery   . Chest pain 01/29/2016  . PAC (premature atrial contraction) 08/14/2014  . PVC's (premature ventricular contractions) 08/14/2014  . Palpitations 11/02/2013  . Hypercholesteremia   . Essential hypertension, benign 11/01/2013  . Peripheral vascular disease, unspecified 11/01/2013    Bjorn Loser, PTA  07/08/16, 4:12 PM [facility] [address] [place], [place], [address] Phone: [phone]   Fax:  [phone]  Name: [name] MRN: [id number] Date of Birth: 1940/11/21

## 2016-07-09 ENCOUNTER — Ambulatory Visit: Payer: Medicare Other | Admitting: Physical Therapy

## 2016-07-09 ENCOUNTER — Encounter: Payer: Self-pay | Admitting: Physical Therapy

## 2016-07-09 ENCOUNTER — Telehealth: Payer: Self-pay | Admitting: Cardiology

## 2016-07-09 DIAGNOSIS — R2681 Unsteadiness on feet: Secondary | ICD-10-CM | POA: Diagnosis not present

## 2016-07-09 DIAGNOSIS — M25651 Stiffness of right hip, not elsewhere classified: Secondary | ICD-10-CM

## 2016-07-09 DIAGNOSIS — M6281 Muscle weakness (generalized): Secondary | ICD-10-CM

## 2016-07-09 DIAGNOSIS — R2689 Other abnormalities of gait and mobility: Secondary | ICD-10-CM

## 2016-07-09 NOTE — Therapy (Signed)
[facility] [address] [place], [place], [address] Phone: [phone]   Fax:  [phone]  Physical Therapy Treatment  Patient Details  Name: [name] MRN: [id number] Date of Birth: 03-May-1941 Referring Provider: Kelton Pillar, MD  Encounter Date: 07/09/2016      PT End of Session - 07/09/16 1551    Visit Number 15   Number of Visits 18   Date for PT Re-Evaluation 07/17/16   Authorization Type Medicare G-Code & progress note   PT Start Time 1445   PT Stop Time 1540   PT Time Calculation (min) 55 min   Equipment Utilized During Treatment Gait belt   Activity Tolerance Patient tolerated treatment well   Behavior During Therapy [facility] for tasks assessed/performed      Past Medical History:  Diagnosis Date  . ACS (acute coronary syndrome) ([place])   . Atypical chest pain    negative cardiolyte 09/2007 w Dr Radford Pax  . CAD in native artery    a. Cor CT 02/2016 - poor quality due to motion/poor filling of coronary arteries with tortuosity, 2 isolated foci in LM (nonobstructive), calcium score 23rd percentile, normal left dominant coronary arteries with noted motion artifact and poor distal vessel visualization.  . Dyslipidemia   . GERD (gastroesophageal reflux disease)   . Hypercholesteremia   . Hypertension   . NSTEMI (non-ST elevated myocardial infarction) ([place]) 04/2016  . PAC (premature atrial contraction)    Event monitors 10/2013 and 08/2014 showed NSR, ST, PACs, PVCs.   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 Posterior communicating artery aneurysm    followed by Dr Vertell Limber  . PVC's (premature ventricular contractions)    Event monitors 10/2013 and 08/2014 showed NSR, ST, PACs, PVCs.     Past Surgical History:  Procedure Laterality Date  . ABDOMINAL HYSTERECTOMY    . 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 CARDIAC  CATHETERIZATION N/A 04/21/2016   Procedure: Left Heart Cath and Coronary Angiography;  Surgeon: Jettie Booze, MD;  Location: [facility];  Service: Cardiovascular;  Laterality: N/A;  . IR GENERIC HISTORICAL  02/13/2016   IR RADIOLOGY PERIPHERAL GUIDED IV START 02/13/2016 Greggory Keen, MD [facility]  . IR GENERIC HISTORICAL  02/13/2016   IR US GUIDE VASC ACCESS LEFT 02/13/2016 Greggory Keen, MD [facility]    There were no vitals filed for this visit.      Subjective Assessment - 07/09/16 1448    Subjective She is wearing prosthesis most of out of bed hours. She gets up for ~30 min ~7 to get paper & coffee wearing prosthesis. then puts it back on limb after shower ~11 and wears until gets back in bed ~7pm. She is only using new prosthesis now.    Pertinent History PAD, HTN, systolic HF, CAD, MI, Brain aneurysm   Limitations Standing;Walking   How long can you sit comfortably?     Patient Stated Goals Walk in house with home with prosthesis with nothing or cane and community with walker   Currently in Pain? Yes   Pain Score 7    Pain Location Leg   Pain Orientation Right   Pain Descriptors / Indicators Throbbing   Pain Type Chronic pain  phantom pain   Pain Onset More than a month ago   Pain Frequency Intermittent   Aggravating Factors  unknown  Pain Relieving Factors unknown     Prosthetic Training with Transfemoral prosthesis: Patient ambulated in/ out & around clinic with RW 100' X 3 with cues on pelvic orientation, initial contact with heel as soon as lower portion swings forward and weight shift over prosthesis.  Patient ambulated 100' X 3 with straight cane with quad tip including carrying plate of objects around furniture with min guard with cues on prosthetic knee control.  PT demo proper technique to pick up object from floor with prosthesis staying extended for safety. Pt return demo 3 reps with UE support on chair back with verbal cues.   Neuromuscular  Re-education: HEP near counter for balance. See pt education & pt instructions.                          Balance Exercises - 07/08/16 1555      Balance Exercises: Standing   Standing Eyes Opened Wide (BOA)  Used mirror and manual feedback working on weight shifting t   Sit to Stand Time in parallel bars on stool: worked on shifting weight to the right progressing to sit<>stand with 1 UE support to attempting no UE support with min A.           PT Education - 07/09/16 1549    Education provided Yes   Education Details HEP for balance near counter: sidestep with reach, quarter turns for 360*, sit to/from stand without touching counter, forward flexion to upright posture and walking near counter turning 180*   Person(s) Educated Patient   Methods Explanation;Demonstration;Tactile cues;Verbal cues;Handout   Comprehension Verbalized understanding;Returned demonstration;Verbal cues required;Tactile cues required;Need further instruction          PT Short Term Goals - 06/18/16 1445      PT SHORT TERM GOAL #1   Title Patient tolerates wear of new prosthesis daily >8 hrs total without pain or limb issues. (Target Date: 06/18/2016)   Baseline MET 06/18/2016   Time 1   Period Months   Status Achieved     PT SHORT TERM GOAL #2   Title Patient verbalizes proper cleaning & demonstrates proper donning of new prosthesis. (Target Date: 06/18/2016)   Baseline MET 06/18/2016   Time 1   Period Months   Status Achieved     PT SHORT TERM GOAL #3   Title Patient ambulates 300' with RW & prosthesis with cues on prosthesis control. (Target Date: 06/18/2016)   Baseline MET 06/18/2016   Time 1   Period Months   Status Achieved     PT SHORT TERM GOAL #4   Title Patient ambulates 63' with cane & prosthesis with minA. (Target Date: 06/18/2016)   Baseline MET 06/12/2016   Time 1   Period Months   Status Achieved           PT Long Term Goals - 06/23/16 1546      PT LONG  TERM GOAL #1   Title Patient verbalizes & demonstrates understanding of prosthetic care with new prosthesis to enable safe use. (Target Date: 07/17/2016)   Time 2   Period Months   Status On-going     PT LONG TERM GOAL #2   Title Patient tolerates wear of new prosthesis >90% of awake hours without skin issues to enable safe use throughout her day. (Target Date: 07/17/2016)   Time 2   Period Months   Status On-going     PT LONG TERM GOAL #3   Title Patient  ambulates 400' with LRAD & new prosthesis modified independent for community mobility. (Target Date: 07/17/2016)   Time 2   Period Months   Status On-going     PT LONG TERM GOAL #4   Title Patient negotiates ramps, curbs & stairs with LRAD & new prosthesis modified independent for community access. (Target Date: 07/17/2016)   Time 2   Period Months   Status On-going     PT LONG TERM GOAL #5   Title Patient ambulates around furniture 100' with LRAD (single UE support) and new prosthesis carrying cup of water modified independent. (Target Date: 07/17/2016)   Time 2   Period Months   Status On-going     PT LONG TERM GOAL #6   Title Berg Balance >/= 40/56 to indicate lower fall risk. (Target Date: 07/17/2016)   Baseline 06/23/16: scored 36/56   Status Revised     PT LONG TERM GOAL #7   Title Timed Up & Go with LRAD (single UE support) & prosthesis <25sec to indicate lower fall risk. (Target Date: 07/17/2016)   Baseline 06/23/16: 29.03 sec's with RW   Status Revised               Plan - 07/09/16 1551    Clinical Impression Statement Patient improved prosthetic knee control with instruction in timing initial contact & weight shift. Patient appears to understand balance exercises near counter for safety.    Rehab Potential Good   PT Frequency 2x / week   PT Duration Other (comment)  9 weeks (60 days)   PT Treatment/Interventions ADLs/Self Care Home Management;DME Instruction;Gait training;Stair training;Functional mobility  training;Therapeutic activities;Therapeutic exercise;Balance training;Neuromuscular re-education;Patient/family education;Prosthetic Training   PT Next Visit Plan assess LTGs   Consulted and Agree with Plan of Care Patient      Patient will benefit from skilled therapeutic intervention in order to improve the following deficits and impairments:  Abnormal gait, Decreased activity tolerance, Decreased balance, Decreased endurance, Decreased knowledge of use of DME, Decreased range of motion, Decreased strength, Postural dysfunction, Prosthetic Dependency  Visit Diagnosis: Unsteadiness on feet  Other abnormalities of gait and mobility  Muscle weakness (generalized)  Stiffness of right hip, not elsewhere classified     Problem List Patient Active Problem List   Diagnosis Date Noted  . Chronic systolic HF (heart failure) ([place]) 05/12/2016  . ACS (acute coronary syndrome) ([place]) 04/21/2016  . NSTEMI (non-ST elevated myocardial infarction) ([place]) 04/21/2016  . Brain aneurysm 04/13/2016  . Hypertension   . Dyslipidemia   . CAD in native artery   . Chest pain 01/29/2016  . PAC (premature atrial contraction) 08/14/2014  . PVC's (premature ventricular contractions) 08/14/2014  . Palpitations 11/02/2013  . Hypercholesteremia   . Essential hypertension, benign 11/01/2013  . Peripheral vascular disease, unspecified 11/01/2013    Jamey Reas PT, DPT 07/09/2016, 3:54 PM  [facility] [address] [place], [place], [address] Phone: [phone]   Fax:  [phone]  Name: [name] MRN: [id number] Date of Birth: [birth year]

## 2016-07-09 NOTE — Telephone Encounter (Signed)
Called patient to let her know of her cardiac MRI appointment.  The patient has another appointment and wishes to change the MRI appointment to another day.  [facility].

## 2016-07-09 NOTE — Patient Instructions (Addendum)
Do all exercises near counter for safety.  FUNCTIONAL MOBILITY: Side Step    Step sideways with right foot then reach with right arm towards upward counter. Step back with feet together. Step sideways with left foot then reach with left arm towards upward counter. Step back with feet together. Step sideways with right foot then reach with right arm along counter. Step back with feet together. Step sideways with left foot then reach with left arm along counter. Step back with feet together. Step sideways with right foot then reach with right arm towards lower counter. Step back with feet together. Step sideways with left foot then reach with left arm towards lower counter. Step back with feet together.   http://plyo.exer.us/51   Copyright  VHI. All rights reserved.  Turning    Turn clockwise full circle until facing counter again. Repeat turning counterclockwise. Go 3 times each way.   Copyright  VHI. All rights reserved.  Sit to Stand / Stand to Sit / Transfers    Sit on edge of a solid chair with arms, feet flat on floor. Lean forward over feet and stand up with hands on chair arms. Sit down slowly with hands on chair arms. Try to not touch counter for balance unless necessary.  Repeat _10___ times  Copyright  VHI. All rights reserved.   Bending forward: Lean forward touching bottom of chair then stand up straight. Repeat 10 times.  Walking along counter:  Sit in chair at one end of counter, stand up walk to other end of counter, turn around towards counter, walk back to chair, turn towards counter then sit down. Repeat 5 times

## 2016-07-14 ENCOUNTER — Encounter: Payer: Self-pay | Admitting: Physical Therapy

## 2016-07-14 ENCOUNTER — Ambulatory Visit: Payer: Medicare Other | Attending: Family Medicine | Admitting: Physical Therapy

## 2016-07-14 DIAGNOSIS — R2681 Unsteadiness on feet: Secondary | ICD-10-CM | POA: Diagnosis present

## 2016-07-14 DIAGNOSIS — R2689 Other abnormalities of gait and mobility: Secondary | ICD-10-CM | POA: Insufficient documentation

## 2016-07-14 DIAGNOSIS — M25651 Stiffness of right hip, not elsewhere classified: Secondary | ICD-10-CM | POA: Diagnosis present

## 2016-07-14 DIAGNOSIS — M6281 Muscle weakness (generalized): Secondary | ICD-10-CM

## 2016-07-15 ENCOUNTER — Encounter: Payer: Self-pay | Admitting: Cardiology

## 2016-07-15 ENCOUNTER — Telehealth: Payer: Self-pay | Admitting: Cardiology

## 2016-07-15 NOTE — Telephone Encounter (Signed)
°  New Prob   Pt is calling regarding her upcoming MRI. States she recently had one on the last several months. Please call.

## 2016-07-15 NOTE — Telephone Encounter (Signed)
Patient was returning call to MRI scheduler. Informed her that MRI was scheduled 1/15 and it is documented a letter was sent. [facility]

## 2016-07-15 NOTE — Therapy (Signed)
[facility] [address] [place], [place], [address] Phone: [phone]   Fax:  [phone]  Physical Therapy Treatment  Patient Details  Name: [name] MRN: [id number] Date of Birth: [birth year] Referring Provider: Kelton Pillar, MD  Encounter Date: 07/14/2016      PT End of Session - 07/14/16 1526    Visit Number 16   Number of Visits 18   Date for PT Re-Evaluation 07/17/16   Authorization Type Medicare G-Code & progress note   PT Start Time 1400   PT Stop Time 1446   PT Time Calculation (min) 46 min   Equipment Utilized During Treatment Gait belt   Activity Tolerance Patient tolerated treatment well   Behavior During Therapy [facility] for tasks assessed/performed      Past Medical History:  Diagnosis Date  . ACS (acute coronary syndrome) ([place])   . Atypical chest pain    negative cardiolyte 09/2007 w Dr Radford Pax  . CAD in native artery    a. Cor CT 02/2016 - poor quality due to motion/poor filling of coronary arteries with tortuosity, 2 isolated foci in LM (nonobstructive), calcium score 23rd percentile, normal left dominant coronary arteries with noted motion artifact and poor distal vessel visualization.  . Dyslipidemia   . GERD (gastroesophageal reflux disease)   . Hypercholesteremia   . Hypertension   . NSTEMI (non-ST elevated myocardial infarction) ([place]) 04/2016  . PAC (premature atrial contraction)    Event monitors 10/2013 and 08/2014 showed NSR, ST, PACs, PVCs.   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 Posterior communicating artery aneurysm    followed by Dr Vertell Limber  . PVC's (premature ventricular contractions)    Event monitors 10/2013 and 08/2014 showed NSR, ST, PACs, PVCs.     Past Surgical History:  Procedure Laterality Date  . ABDOMINAL HYSTERECTOMY    . 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 CARDIAC  CATHETERIZATION N/A 04/21/2016   Procedure: Left Heart Cath and Coronary Angiography;  Surgeon: Jettie Booze, MD;  Location: [place] [facility];  Service: Cardiovascular;  Laterality: N/A;  . IR GENERIC HISTORICAL  02/13/2016   IR RADIOLOGY PERIPHERAL GUIDED IV START 02/13/2016 Greggory Keen, MD [facility]  . IR GENERIC HISTORICAL  02/13/2016   IR US GUIDE VASC ACCESS LEFT 02/13/2016 Greggory Keen, MD [facility]    There were no vitals filed for this visit.      Subjective Assessment - 07/14/16 1407    Subjective She has been doing the exercises. The turning around exercise was most difficult.    Pertinent History PAD, HTN, systolic HF, CAD, MI, Brain aneurysm   Limitations Standing;Walking   Patient Stated Goals Walk in house with home with prosthesis with nothing or cane and community with walker   Currently in Pain? No/denies            [facility] PT Assessment - 07/14/16 1409      Ambulation/Gait   Gait velocity 1.45 ft/sec RW comfortable & 1.59 ft/sec RW fast;  0.3f/sec with cane comfortable     Standardized Balance Assessment   Standardized Balance Assessment Four Square Step Test     Berg Balance Test   Sit to Stand Able to stand  independently using hands   Standing Unsupported Able to stand safely 2 minutes   Sitting with Back Unsupported but Feet Supported on Floor  or Stool Able to sit safely and securely 2 minutes   Stand to Sit Sits safely with minimal use of hands   Transfers Able to transfer safely, minor use of hands   Standing Unsupported with Eyes Closed Able to stand 10 seconds safely   Standing Ubsupported with Feet Together Able to place feet together independently and stand 1 minute safely   From Standing, Reach Forward with Outstretched Arm Can reach forward >12 cm safely (5")   From Standing Position, Pick up Object from Floor Able to pick up shoe, needs supervision   From Standing Position, Turn to Look Behind Over each Shoulder Looks behind one  side only/other side shows less weight shift   Turn 360 Degrees Able to turn 360 degrees safely but slowly   Standing Unsupported, Alternately Place Feet on Step/Stool Able to complete >2 steps/needs minimal assist   Standing Unsupported, One Foot in Front Able to take small step independently and hold 30 seconds   Standing on One Leg Able to lift leg independently and hold equal to or more than 3 seconds   Total Score 43   Berg comment: Initial was 10/56, On 12/12 was 36/56.     Timed Up and Go Test   Normal TUG (seconds) 43.8  single point cane with quad tip     Four Square Step Test    Trial One  50.87  cane with 4 balance losses.   Trial Two 51.21  cane with 2 balance losses                     [facility] Adult PT Treatment/Exercise - 07/14/16 1409      Ambulation/Gait   Ambulation/Gait Yes   Ambulation/Gait Assistance 4: Min guard;6: Modified independent (Device/Increase time)  min guard with cane, verbal cues for RW no safety issues   Ambulation/Gait Assistance Details cues for upright posture, step thru pattern, pelvic orientation to line of progression   Ambulation Distance (Feet) 75 Feet  75' X 4 with cane, 400' with RW   Assistive device Prosthesis;Straight cane;Rolling walker  quad tip on cane   Ambulation Surface Indoor;Level     Prosthetics   Current prosthetic wear tolerance (days/week)  daily   Current prosthetic wear tolerance (#hours/day)  wearing ~80% of her awake hours; 11am-6pm   Residual limb condition  no issues per pt report                  PT Short Term Goals - 06/18/16 1445      PT SHORT TERM GOAL #1   Title Patient tolerates wear of new prosthesis daily >8 hrs total without pain or limb issues. (Target Date: 06/18/2016)   Baseline MET 06/18/2016   Time 1   Period Months   Status Achieved     PT SHORT TERM GOAL #2   Title Patient verbalizes proper cleaning & demonstrates proper donning of new prosthesis. (Target Date:  06/18/2016)   Baseline MET 06/18/2016   Time 1   Period Months   Status Achieved     PT SHORT TERM GOAL #3   Title Patient ambulates 300' with RW & prosthesis with cues on prosthesis control. (Target Date: 06/18/2016)   Baseline MET 06/18/2016   Time 1   Period Months   Status Achieved     PT SHORT TERM GOAL #4   Title Patient ambulates 60' with cane & prosthesis with minA. (Target Date: 06/18/2016)   Baseline MET 06/12/2016   Time  1   Period Months   Status Achieved           PT Long Term Goals - 07/14/16 1538      PT LONG TERM GOAL #1   Title Patient verbalizes & demonstrates understanding of prosthetic care with new prosthesis to enable safe use. (Target Date: 07/17/2016)   Baseline MET 07/14/2016   Time 2   Period Months   Status Achieved     PT LONG TERM GOAL #2   Title Patient tolerates wear of new prosthesis >90% of awake hours without skin issues to enable safe use throughout her day. (Target Date: 07/17/2016)   Baseline MET 07/14/2016   Time 2   Period Months   Status Achieved     PT LONG TERM GOAL #3   Title Patient ambulates 400' with LRAD & new prosthesis modified independent for community mobility. (Target Date: 07/17/2016)   Time 2   Period Months   Status On-going     PT LONG TERM GOAL #4   Title Patient negotiates ramps, curbs & stairs with LRAD & new prosthesis modified independent for community access. (Target Date: 07/17/2016)   Time 2   Period Months   Status On-going     PT LONG TERM GOAL #5   Title Patient ambulates around furniture 100' with LRAD (single UE support) and new prosthesis carrying cup of water modified independent. (Target Date: 07/17/2016)   Time 2   Period Months   Status On-going     PT LONG TERM GOAL #6   Title Berg Balance >/= 45/56 to indicate lower fall risk. (Target Date: 07/17/2016) NEW Target Date 09/11/2016   Baseline MET 07/14/2016 Berg Balance 43/56 with LTG >40/56; PT updated   Status On-going     PT LONG TERM GOAL #7   Title  Timed Up & Go with LRAD (single UE support) & prosthesis <30sec to indicate lower fall risk. (Target Date: 07/17/2016)  NEW Target Date 09/11/2016   Baseline NOT MET 07/14/2016  TUG with cane 43.8sec   Status On-going               Plan - 07/14/16 1543    Clinical Impression Statement Patient improved balance as noted by Merrilee Jansky Balance of 43/56. Patient appears would benefit from additional PT to improve balance & gait further. Patient agrees with continuing PT at 1/xwk for 8 weeks.    Rehab Potential Good   PT Frequency 2x / week   PT Duration Other (comment)  9 weeks (60 days)   PT Treatment/Interventions ADLs/Self Care Home Management;DME Instruction;Gait training;Stair training;Functional mobility training;Therapeutic activities;Therapeutic exercise;Balance training;Neuromuscular re-education;Patient/family education;Prosthetic Training   PT Next Visit Plan finish checking LTGs and recertify with 1x/wk frequency.    Consulted and Agree with Plan of Care Patient      Patient will benefit from skilled therapeutic intervention in order to improve the following deficits and impairments:  Abnormal gait, Decreased activity tolerance, Decreased balance, Decreased endurance, Decreased knowledge of use of DME, Decreased range of motion, Decreased strength, Postural dysfunction, Prosthetic Dependency  Visit Diagnosis: Unsteadiness on feet  Other abnormalities of gait and mobility  Muscle weakness (generalized)  Stiffness of right hip, not elsewhere classified     Problem List Patient Active Problem List   Diagnosis Date Noted  . Chronic systolic HF (heart failure) ([place]) 05/12/2016  . ACS (acute coronary syndrome) ([place]) 04/21/2016  . NSTEMI (non-ST elevated myocardial infarction) ([place]) 04/21/2016  . Brain aneurysm 04/13/2016  . Hypertension   .  Dyslipidemia   . CAD in native artery   . Chest pain 01/29/2016  . PAC (premature atrial contraction) 08/14/2014  . PVC's (premature  ventricular contractions) 08/14/2014  . Palpitations 11/02/2013  . Hypercholesteremia   . Essential hypertension, benign 11/01/2013  . Peripheral vascular disease, unspecified 11/01/2013    , PT, DPT 07/15/2016, 6:55 AM  [facility] [address] [place], [place], [address] Phone: [phone]   Fax:  [phone]  Name: [name] MRN: [id number] Date of Birth: [birth year]

## 2016-07-15 NOTE — Telephone Encounter (Signed)
Called patient but could not leave a message.  Letter mailed regarding her cardiac MRI scheduled for 07-27-16.

## 2016-07-16 ENCOUNTER — Ambulatory Visit: Payer: Medicare Other | Admitting: Physical Therapy

## 2016-07-16 ENCOUNTER — Ambulatory Visit (HOSPITAL_COMMUNITY): Payer: Medicare Other

## 2016-07-21 ENCOUNTER — Encounter: Payer: Medicare Other | Admitting: Physical Therapy

## 2016-07-23 ENCOUNTER — Ambulatory Visit: Payer: Medicare Other | Admitting: Physical Therapy

## 2016-07-23 ENCOUNTER — Encounter: Payer: Self-pay | Admitting: Physical Therapy

## 2016-07-23 DIAGNOSIS — R2689 Other abnormalities of gait and mobility: Secondary | ICD-10-CM

## 2016-07-23 DIAGNOSIS — R2681 Unsteadiness on feet: Secondary | ICD-10-CM | POA: Diagnosis not present

## 2016-07-23 DIAGNOSIS — M6281 Muscle weakness (generalized): Secondary | ICD-10-CM

## 2016-07-23 DIAGNOSIS — M25651 Stiffness of right hip, not elsewhere classified: Secondary | ICD-10-CM

## 2016-07-23 NOTE — Therapy (Signed)
[facility] [address] [place], [place], [address] Phone: [phone]   Fax:  [phone]  Physical Therapy Treatment  Patient Details  Name: [name] MRN: [id number] Date of Birth: 08/08/1940 Referring Provider: Kelton Pillar, MD  Encounter Date: 07/23/2016      PT End of Session - 07/23/16 1410    Visit Number 17   Number of Visits 18   Date for PT Re-Evaluation 07/17/16   Authorization Type Medicare G-Code & progress note   PT Start Time 0258   PT Stop Time 1443   PT Time Calculation (min) 40 min   Equipment Utilized During Treatment Gait belt   Activity Tolerance Patient tolerated treatment well   Behavior During Therapy [facility] for tasks assessed/performed      Past Medical History:  Diagnosis Date  . ACS (acute coronary syndrome) ([place])   . Atypical chest pain    negative cardiolyte 09/2007 w Dr Radford Pax  . CAD in native artery    a. Cor CT 02/2016 - poor quality due to motion/poor filling of coronary arteries with tortuosity, 2 isolated foci in LM (nonobstructive), calcium score 23rd percentile, normal left dominant coronary arteries with noted motion artifact and poor distal vessel visualization.  . Dyslipidemia   . GERD (gastroesophageal reflux disease)   . Hypercholesteremia   . Hypertension   . NSTEMI (non-ST elevated myocardial infarction) ([place]) 04/2016  . PAC (premature atrial contraction)    Event monitors 10/2013 and 08/2014 showed NSR, ST, PACs, PVCs.   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 Posterior communicating artery aneurysm    followed by Dr Vertell Limber  . PVC's (premature ventricular contractions)    Event monitors 10/2013 and 08/2014 showed NSR, ST, PACs, PVCs.     Past Surgical History:  Procedure Laterality Date  . ABDOMINAL HYSTERECTOMY    . 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 CARDIAC  CATHETERIZATION N/A 04/21/2016   Procedure: Left Heart Cath and Coronary Angiography;  Surgeon: Jettie Booze, MD;  Location: [place] [facility];  Service: Cardiovascular;  Laterality: N/A;  . IR GENERIC HISTORICAL  02/13/2016   IR RADIOLOGY PERIPHERAL GUIDED IV START 02/13/2016 Greggory Keen, MD [facility]  . IR GENERIC HISTORICAL  02/13/2016   IR US GUIDE VASC ACCESS LEFT 02/13/2016 Greggory Keen, MD [facility]    There were no vitals filed for this visit.      Subjective Assessment - 07/23/16 1408    Subjective No new falls. Having phatom pains today "those toes that are not there, i just want to cut them off again".    Pertinent History PAD, HTN, systolic HF, CAD, MI, Brain aneurysm   Limitations Standing;Walking   Patient Stated Goals Walk in house with home with prosthesis with nothing or cane and community with walker   Currently in Pain? Yes   Pain Score 7    Pain Location Leg   Pain Orientation Right   Pain Descriptors / Indicators Throbbing   Pain Type Phantom pain;Chronic pain   Pain Onset More than a month ago   Pain Frequency Intermittent   Aggravating Factors  unknown   Pain Relieving Factors sometimes medication helps            [facility] Adult PT Treatment/Exercise - 07/23/16 1412      Transfers   Transfers Sit to Stand;Stand to  Sit   Sit to Stand 6: Modified independent (Device/Increase time)   Stand to Sit 5: Supervision;With upper extremity assist;To chair/3-in-1;With armrests   Stand to Sit Details cues to turn all the way to surface before sitting down and to keep RW close      Ambulation/Gait   Ambulation/Gait Yes   Ambulation/Gait Assistance 6: Modified independent (Device/Increase time);4: Min guard;4: Min assist   Ambulation/Gait Assistance Details Mod I with RW. min guard to min assist with cane with cues on posture, step length and weight shifting   Ambulation Distance (Feet) 440 Feet  x1 with RW mod I; 100 x1 with cane   Assistive device  Rolling walker;Prosthesis;Straight cane  straight cane with rubber quad tip   Gait Pattern Decreased step length - left;Decreased stance time - right;Decreased stride length;Decreased hip/knee flexion - right;Decreased weight shift to right;Right hip hike;Right circumduction;Antalgic;Lateral hip instability;Trunk flexed;Abducted- right;Poor foot clearance - right;Step-through pattern   Ambulation Surface Level;Indoor   Stairs Yes   Stairs Assistance 6: Modified independent (Device/Increase time)   Stair Management Technique Two rails;Step to pattern;Forwards   Number of Stairs 4   Ramp 5: Supervision  with RW/prosthesis   Ramp Details (indicate cue type and reason) cues on sequencing and posture   Curb 5: Supervision  with RW/prosthesis   Curb Details (indicate cue type and reason) cues on stance position to assist with balance             PT Short Term Goals - 06/18/16 1445      PT SHORT TERM GOAL #1   Title Patient tolerates wear of new prosthesis daily >8 hrs total without pain or limb issues. (Target Date: 06/18/2016)   Baseline MET 06/18/2016   Time 1   Period Months   Status Achieved     PT SHORT TERM GOAL #2   Title Patient verbalizes proper cleaning & demonstrates proper donning of new prosthesis. (Target Date: 06/18/2016)   Baseline MET 06/18/2016   Time 1   Period Months   Status Achieved     PT SHORT TERM GOAL #3   Title Patient ambulates 300' with RW & prosthesis with cues on prosthesis control. (Target Date: 06/18/2016)   Baseline MET 06/18/2016   Time 1   Period Months   Status Achieved     PT SHORT TERM GOAL #4   Title Patient ambulates 60' with cane & prosthesis with minA. (Target Date: 06/18/2016)   Baseline MET 06/12/2016   Time 1   Period Months   Status Achieved           PT Long Term Goals - 07/23/16 1411      PT LONG TERM GOAL #1   Title Patient verbalizes & demonstrates understanding of prosthetic care with new prosthesis to enable safe use.  (Target Date: 07/17/2016)   Baseline MET 07/14/2016   Status Achieved     PT LONG TERM GOAL #2   Title Patient tolerates wear of new prosthesis >90% of awake hours without skin issues to enable safe use throughout her day. (Target Date: 07/17/2016)   Period Months   Status Achieved     PT LONG TERM GOAL #3   Title Patient ambulates 400' with LRAD & new prosthesis modified independent for community mobility. (Target Date: 07/17/2016)   Baseline 07/23/16: met with RW   Time --   Period --   Status Achieved     PT LONG TERM GOAL #4   Title Patient negotiates ramps, curbs &  stairs with LRAD & new prosthesis modified independent for community access. (Target Date: 07/17/2016)   Baseline 07/23/16: met with RW/rails   Time --   Period --   Status Achieved     PT LONG TERM GOAL #5   Title Patient ambulates around furniture 100' with LRAD (single UE support) and new prosthesis carrying cup of water modified independent. (Target Date: 07/17/2016)   Baseline 07/23/16: pt able to ambulate 100 ft with cane around obstacles with min guard/min assist   Time --   Period --   Status Partially Met     PT LONG TERM GOAL #6   Title Berg Balance >/= 45/56 to indicate lower fall risk. (Target Date: 07/17/2016) NEW Target Date 09/11/2016   Baseline MET 07/14/2016 Berg Balance 43/56 with LTG >40/56; PT updated   Status On-going     PT LONG TERM GOAL #7   Title Timed Up & Go with LRAD (single UE support) & prosthesis <30sec to indicate lower fall risk. (Target Date: 07/17/2016)  NEW Target Date 09/11/2016   Baseline NOT MET 07/14/2016  TUG with cane 43.8sec   Status On-going           Plan - 07/23/16 1410    Clinical Impression Statement Pt met 2/3 remaining LTGs. Primary PT to renew. Pt should benefit from continued PT to progress toward goals of renewal.   Rehab Potential Good   PT Frequency 2x / week   PT Duration Other (comment)  9 weeks (60 days)   PT Treatment/Interventions ADLs/Self Care Home Management;DME  Instruction;Gait training;Stair training;Functional mobility training;Therapeutic activities;Therapeutic exercise;Balance training;Neuromuscular re-education;Patient/family education;Prosthetic Training   PT Next Visit Plan continue working with cane, balance activities   Consulted and Agree with Plan of Care Patient      Patient will benefit from skilled therapeutic intervention in order to improve the following deficits and impairments:  Abnormal gait, Decreased activity tolerance, Decreased balance, Decreased endurance, Decreased knowledge of use of DME, Decreased range of motion, Decreased strength, Postural dysfunction, Prosthetic Dependency  Visit Diagnosis: Unsteadiness on feet  Other abnormalities of gait and mobility  Muscle weakness (generalized)  Stiffness of right hip, not elsewhere classified     Problem List Patient Active Problem List   Diagnosis Date Noted  . Chronic systolic HF (heart failure) ([place]) 05/12/2016  . ACS (acute coronary syndrome) ([place]) 04/21/2016  . NSTEMI (non-ST elevated myocardial infarction) ([place]) 04/21/2016  . Brain aneurysm 04/13/2016  . Hypertension   . Dyslipidemia   . CAD in native artery   . Chest pain 01/29/2016  . PAC (premature atrial contraction) 08/14/2014  . PVC's (premature ventricular contractions) 08/14/2014  . Palpitations 11/02/2013  . Hypercholesteremia   . Essential hypertension, benign 11/01/2013  . Peripheral vascular disease, unspecified 11/01/2013    Willow Ora, PTA, [facility] [address], [place], [place] [address] [phone] 07/23/16, 3:05 PM   Name: SONORA CATLIN MRN: [id number] Date of Birth: 1940/10/09

## 2016-07-27 ENCOUNTER — Ambulatory Visit (HOSPITAL_COMMUNITY)
Admission: RE | Admit: 2016-07-27 | Discharge: 2016-07-27 | Disposition: A | Discharge status: Home / Self Care | Payer: Medicare Other | Source: Ambulatory Visit | Attending: Cardiology | Admitting: Cardiology

## 2016-07-27 DIAGNOSIS — I517 Cardiomegaly: Secondary | ICD-10-CM | POA: Diagnosis not present

## 2016-07-27 DIAGNOSIS — R931 Abnormal findings on diagnostic imaging of heart and coronary circulation: Secondary | ICD-10-CM | POA: Insufficient documentation

## 2016-07-27 DIAGNOSIS — I422 Other hypertrophic cardiomyopathy: Secondary | ICD-10-CM | POA: Diagnosis not present

## 2016-07-27 LAB — CREATININE, SERUM
Creatinine, Ser: 0.83 mg/dL (ref 0.44–1.00)
GFR calc Af Amer: >60 mL/min (ref >60)
GFR calc non Af Amer: >60 mL/min (ref >60)

## 2016-07-27 MED ORDER — GADOBENATE DIMEGLUMINE 529 MG/ML IV SOLN
21.0000 mL | Freq: Once | INTRAVENOUS | Status: AC | PRN
  Administered 2016-07-27: 20 mL via INTRAVENOUS

## 2016-07-28 ENCOUNTER — Encounter: Payer: Self-pay | Admitting: Physical Therapy

## 2016-07-28 ENCOUNTER — Ambulatory Visit: Payer: Medicare Other | Admitting: Physical Therapy

## 2016-07-28 DIAGNOSIS — M25651 Stiffness of right hip, not elsewhere classified: Secondary | ICD-10-CM

## 2016-07-28 DIAGNOSIS — R2681 Unsteadiness on feet: Secondary | ICD-10-CM | POA: Diagnosis not present

## 2016-07-28 DIAGNOSIS — R2689 Other abnormalities of gait and mobility: Secondary | ICD-10-CM

## 2016-07-28 DIAGNOSIS — M6281 Muscle weakness (generalized): Secondary | ICD-10-CM

## 2016-07-30 NOTE — Therapy (Signed)
[facility] [address] [place], [place], [address] Phone: [phone]   Fax:  [phone]  Physical Therapy Treatment  Patient Details  Name: [name] MRN: [id number] Date of Birth: [birth year] Referring Provider: Kelton Pillar, MD  Encounter Date: 07/28/2016   07/28/16 1453  PT Visits / Re-Eval  Visit Number 18  Number of Visits 18  Date for PT Re-Evaluation 07/17/16  Authorization  Authorization Type Medicare G-Code & progress note  PT Time Calculation  PT Start Time 1448  PT Stop Time 1530  PT Time Calculation (min) 42 min  PT - End of Session  Equipment Utilized During Treatment Gait belt  Activity Tolerance Patient tolerated treatment well  Behavior During Therapy [facility] for tasks assessed/performed     Past Medical History:  Diagnosis Date  . ACS (acute coronary syndrome) ([place])   . Atypical chest pain    negative cardiolyte 09/2007 w Dr Radford Pax  . CAD in native artery    a. Cor CT 02/2016 - poor quality due to motion/poor filling of coronary arteries with tortuosity, 2 isolated foci in LM (nonobstructive), calcium score 23rd percentile, normal left dominant coronary arteries with noted motion artifact and poor distal vessel visualization.  . Dyslipidemia   . GERD (gastroesophageal reflux disease)   . Hypercholesteremia   . Hypertension   . NSTEMI (non-ST elevated myocardial infarction) ([place]) 04/2016  . PAC (premature atrial contraction)    Event monitors 10/2013 and 08/2014 showed NSR, ST, PACs, PVCs.   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 Posterior communicating artery aneurysm    followed by Dr Vertell Limber  . PVC's (premature ventricular contractions)    Event monitors 10/2013 and 08/2014 showed NSR, ST, PACs, PVCs.     Past Surgical History:  Procedure Laterality Date  . ABDOMINAL HYSTERECTOMY    . 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 CARDIAC CATHETERIZATION N/A 04/21/2016   Procedure: Left Heart Cath and Coronary Angiography;  Surgeon: Jettie Booze, MD;  Location: [place] [facility];  Service: Cardiovascular;  Laterality: N/A;  . IR GENERIC HISTORICAL  02/13/2016   IR RADIOLOGY PERIPHERAL GUIDED IV START 02/13/2016 Greggory Keen, MD [facility]  . IR GENERIC HISTORICAL  02/13/2016   IR US GUIDE VASC ACCESS LEFT 02/13/2016 Greggory Keen, MD [facility]    There were no vitals filed for this visit.     07/28/16 1452  Symptoms/Limitations  Subjective No new complaints. No falls to report today. does report a cold that is trying to "get her down".  Pertinent History PAD, HTN, systolic HF, CAD, MI, Brain aneurysm  Limitations Standing;Walking  Patient Stated Goals Walk in house with home with prosthesis with nothing or cane and community with walker  Pain Assessment  Currently in Pain? No/denies  Pain Score 0      07/28/16 1454  Transfers  Transfers Sit to Stand;Stand to Sit  Sit to Stand 5: Supervision;With upper extremity assist;From chair/3-in-1  Sit to Stand Details (indicate cue type and reason) cues for safe technique when using cane  Stand to Sit 5: Supervision;With upper extremity assist;To chair/3-in-1;With armrests  Stand to Sit Details cues to turn all the way to surface before initiating sitting for increased safety  Ambulation/Gait  Ambulation/Gait Yes  Ambulation/Gait Assistance 4: Min guard;4: Min assist  Ambulation/Gait Assistance Details cues on posture, step length, weight  shifting, cane placement and to ensure prosthetic knee locked before stepping left leg forward. buckling x 3 total with all gait.  Ambulation Distance (Feet) 120 Feet (x 3 reps)  Assistive device Straight cane;Prosthesis (cane with rubber quad tip)  Gait Pattern Decreased step length - left;Decreased stance time - right;Decreased stride length;Decreased hip/knee flexion - right;Decreased weight  shift to right;Right hip hike;Right circumduction;Antalgic;Lateral hip instability;Trunk flexed;Abducted- right;Poor foot clearance - right;Step-through pattern  Ambulation Surface Level;Indoor  Ramp 4: Min assist (with cane/prosthesis)  Ramp Details (indicate cue type and reason) cues on technique, sequencing and for posture.  Curb 4: Min assist (with cane/prosthesis)  Curb Details (indicate cue type and reason) cues on sequencing and technique  Prosthetics  Current prosthetic wear tolerance (days/week)  daily  Current prosthetic wear tolerance (#hours/day)  wearing ~80% of her awake hours; 11am-6pm  Residual limb condition  no issues per pt report  Education Provided Proper wear schedule/adjustment;Proper weight-bearing schedule/adjustment;Residual limb care  Person(s) Educated Patient  Education Method Explanation;Demonstration;Verbal cues  Education Method Verbalized understanding;Needs further instruction  Donning Prosthesis 6  Doffing Prosthesis 6          PT Short Term Goals - 06/18/16 1445      PT SHORT TERM GOAL #1   Title Patient tolerates wear of new prosthesis daily >8 hrs total without pain or limb issues. (Target Date: 06/18/2016)   Baseline MET 06/18/2016   Time 1   Period Months   Status Achieved     PT SHORT TERM GOAL #2   Title Patient verbalizes proper cleaning & demonstrates proper donning of new prosthesis. (Target Date: 06/18/2016)   Baseline MET 06/18/2016   Time 1   Period Months   Status Achieved     PT SHORT TERM GOAL #3   Title Patient ambulates 300' with RW & prosthesis with cues on prosthesis control. (Target Date: 06/18/2016)   Baseline MET 06/18/2016   Time 1   Period Months   Status Achieved     PT SHORT TERM GOAL #4   Title Patient ambulates 49' with cane & prosthesis with minA. (Target Date: 06/18/2016)   Baseline MET 06/12/2016   Time 1   Period Months   Status Achieved           PT Long Term Goals - 07/23/16 1411      PT LONG  TERM GOAL #1   Title Patient verbalizes & demonstrates understanding of prosthetic care with new prosthesis to enable safe use. (Target Date: 07/17/2016)   Baseline MET 07/14/2016   Status Achieved     PT LONG TERM GOAL #2   Title Patient tolerates wear of new prosthesis >90% of awake hours without skin issues to enable safe use throughout her day. (Target Date: 07/17/2016)   Period Months   Status Achieved     PT LONG TERM GOAL #3   Title Patient ambulates 400' with LRAD & new prosthesis modified independent for community mobility. (Target Date: 07/17/2016)   Baseline 07/23/16: met with RW   Time --   Period --   Status Achieved     PT LONG TERM GOAL #4   Title Patient negotiates ramps, curbs & stairs with LRAD & new prosthesis modified independent for community access. (Target Date: 07/17/2016)   Baseline 07/23/16: met with RW/rails   Time --   Period --   Status Achieved     PT LONG TERM GOAL #5   Title Patient ambulates around furniture 100' with LRAD (single UE  support) and new prosthesis carrying cup of water modified independent. (Target Date: 07/17/2016)   Baseline 07/23/16: pt able to ambulate 100 ft with cane around obstacles with min guard/min assist   Time --   Period --   Status Partially Met     PT LONG TERM GOAL #6   Title Berg Balance >/= 45/56 to indicate lower fall risk. (Target Date: 07/17/2016) NEW Target Date 09/11/2016   Baseline MET 07/14/2016 Berg Balance 43/56 with LTG >40/56; PT updated   Status On-going     PT LONG TERM GOAL #7   Title Timed Up & Go with LRAD (single UE support) & prosthesis <30sec to indicate lower fall risk. (Target Date: 07/17/2016)  NEW Target Date 09/11/2016   Baseline NOT MET 07/14/2016  TUG with cane 43.8sec   Status On-going        07/28/16 1453  Plan  Clinical Impression Statement Today's skilled session continued to focus on gait with cane/prosthesis. Initiated ramp and curb instruction today with cane with assistance and cues needed. Pt is  making steady progress toward goals and should benefit from continued PT to progress toward goals not met.                        Pt will benefit from skilled therapeutic intervention in order to improve on the following deficits Abnormal gait;Decreased activity tolerance;Decreased balance;Decreased endurance;Decreased knowledge of use of DME;Decreased range of motion;Decreased strength;Postural dysfunction;Prosthetic Dependency  Rehab Potential Good  PT Frequency 2x / week  PT Duration Other (comment) (9 weeks (60 days))  PT Treatment/Interventions ADLs/Self Care Home Management;DME Instruction;Gait training;Stair training;Functional mobility training;Therapeutic activities;Therapeutic exercise;Balance training;Neuromuscular re-education;Patient/family education;Prosthetic Training  PT Next Visit Plan continue working with cane, balance activities  Consulted and Agree with Plan of Care Patient       Patient will benefit from skilled therapeutic intervention in order to improve the following deficits and impairments:  Abnormal gait, Decreased activity tolerance, Decreased balance, Decreased endurance, Decreased knowledge of use of DME, Decreased range of motion, Decreased strength, Postural dysfunction, Prosthetic Dependency  Visit Diagnosis: Unsteadiness on feet  Other abnormalities of gait and mobility  Muscle weakness (generalized)  Stiffness of right hip, not elsewhere classified     Problem List Patient Active Problem List   Diagnosis Date Noted  . Chronic systolic HF (heart failure) ([place]) 05/12/2016  . ACS (acute coronary syndrome) ([place]) 04/21/2016  . NSTEMI (non-ST elevated myocardial infarction) ([place]) 04/21/2016  . Brain aneurysm 04/13/2016  . Hypertension   . Dyslipidemia   . CAD in native artery   . Chest pain 01/29/2016  . PAC (premature atrial contraction) 08/14/2014  . PVC's (premature ventricular contractions) 08/14/2014  . Palpitations 11/02/2013  .  Hypercholesteremia   . Essential hypertension, benign 11/01/2013  . Peripheral vascular disease, unspecified 11/01/2013    Willow Ora, PTA, [facility] [address], [place], [place] [address] [phone] 07/30/16, 9:49 PM   Name: [name] MRN: [id number] Date of Birth: 05-Oct-1940

## 2016-08-04 ENCOUNTER — Encounter: Payer: Medicare Other | Admitting: Physical Therapy

## 2016-08-06 ENCOUNTER — Encounter: Payer: Self-pay | Admitting: Physical Therapy

## 2016-08-06 DIAGNOSIS — M25651 Stiffness of right hip, not elsewhere classified: Secondary | ICD-10-CM

## 2016-08-06 DIAGNOSIS — M6281 Muscle weakness (generalized): Secondary | ICD-10-CM

## 2016-08-06 DIAGNOSIS — R2681 Unsteadiness on feet: Secondary | ICD-10-CM

## 2016-08-06 DIAGNOSIS — R2689 Other abnormalities of gait and mobility: Secondary | ICD-10-CM

## 2016-08-11 ENCOUNTER — Ambulatory Visit: Payer: Medicare Other | Admitting: Physical Therapy

## 2016-08-11 ENCOUNTER — Encounter: Payer: Self-pay | Admitting: Physical Therapy

## 2016-08-11 DIAGNOSIS — M6281 Muscle weakness (generalized): Secondary | ICD-10-CM

## 2016-08-11 DIAGNOSIS — R2681 Unsteadiness on feet: Secondary | ICD-10-CM

## 2016-08-11 DIAGNOSIS — R2689 Other abnormalities of gait and mobility: Secondary | ICD-10-CM

## 2016-08-11 NOTE — Therapy (Signed)
[facility] [address] [place], [place], [address] Phone: [phone]   Fax:  [phone]  Physical Therapy Treatment  Patient Details  Name: [name] MRN: [id number] Date of Birth: 1940-12-30 Referring Provider: Kelton Pillar, MD  Encounter Date: 08/11/2016      PT End of Session - 08/11/16 2159    Visit Number 18   Number of Visits 26   Date for PT Re-Evaluation 09/18/16   Authorization Type Medicare G-Code & progress note   PT Start Time 1446   PT Stop Time 1528   PT Time Calculation (min) 42 min   Equipment Utilized During Treatment Gait belt   Activity Tolerance Patient tolerated treatment well   Behavior During Therapy [facility] for tasks assessed/performed      Past Medical History:  Diagnosis Date  . ACS (acute coronary syndrome) ([place])   . Atypical chest pain    negative cardiolyte 09/2007 w Dr Radford Pax  . CAD in native artery    a. Cor CT 02/2016 - poor quality due to motion/poor filling of coronary arteries with tortuosity, 2 isolated foci in LM (nonobstructive), calcium score 23rd percentile, normal left dominant coronary arteries with noted motion artifact and poor distal vessel visualization.  . Dyslipidemia   . GERD (gastroesophageal reflux disease)   . Hypercholesteremia   . Hypertension   . NSTEMI (non-ST elevated myocardial infarction) ([place]) 04/2016  . PAC (premature atrial contraction)    Event monitors 10/2013 and 08/2014 showed NSR, ST, PACs, PVCs.   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 Posterior communicating artery aneurysm    followed by Dr Vertell Limber  . PVC's (premature ventricular contractions)    Event monitors 10/2013 and 08/2014 showed NSR, ST, PACs, PVCs.     Past Surgical History:  Procedure Laterality Date  . ABDOMINAL HYSTERECTOMY    . 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 CARDIAC  CATHETERIZATION N/A 04/21/2016   Procedure: Left Heart Cath and Coronary Angiography;  Surgeon: Jettie Booze, MD;  Location: [facility];  Service: Cardiovascular;  Laterality: N/A;  . IR GENERIC HISTORICAL  02/13/2016   IR RADIOLOGY PERIPHERAL GUIDED IV START 02/13/2016 Greggory Keen, MD [facility]  . IR GENERIC HISTORICAL  02/13/2016   IR US GUIDE VASC ACCESS LEFT 02/13/2016 Greggory Keen, MD [facility]    There were no vitals filed for this visit.      Subjective Assessment - 08/11/16 1448    Subjective She has been in bed for 3-4 days with cough, sore throat & aching. She did not wear the prosthesis as in bed. She feels better today.    Pertinent History PAD, HTN, systolic HF, CAD, MI, Brain aneurysm    Limitations Standing;Walking   Patient Stated Goals Walk in house with home with prosthesis with nothing or cane and community with walker   Currently in Pain? No/denies     Neuromuscular Re-education: balance & posture with Transfemoral Amputation prosthesis Patient ambulated 30' X 2 and 108' X 1 with cane with quad tip with minimal guard with verbal, visual & tactile cues on step thru pattern.  Counter for support: alternate reaching forward to back wall 5-7", reaching laterally 4" & across midline, side step with overhead lateral reach right & left, turning clockwise & counterclockwise quarter turns leading with pelvis, sidestepping and stepping over yardstick at toes &  holding position without UE support 5 sec.  Standing in corner with chair back anterior for safety: standing with feet shoulder width apart head turns right /left, up/down, up-right/down-left and up-left/ down-right: on floor with eyes open and with eyes closed; standing on pillows/compliant surface with eyes open.                               PT Short Term Goals - 08/11/16 2200      PT SHORT TERM GOAL #1   Title Patient tolerates wear of new prosthesis daily >8 hrs total without  pain or limb issues. (Target Date: 06/18/2016)   Baseline MET 06/18/2016   Time 1   Period Months   Status Achieved     PT SHORT TERM GOAL #2   Title Patient verbalizes proper cleaning & demonstrates proper donning of new prosthesis. (Target Date: 06/18/2016)   Baseline MET 06/18/2016   Time 1   Period Months   Status Achieved     PT SHORT TERM GOAL #3   Title Patient ambulates 300' with RW & prosthesis with cues on prosthesis control. (Target Date: 06/18/2016)   Baseline MET 06/18/2016   Time 1   Period Months   Status Achieved     PT SHORT TERM GOAL #4   Title Patient ambulates 86' with cane & prosthesis with minA. (Target Date: 06/18/2016)   Baseline MET 06/12/2016   Time 1   Period Months   Status Achieved     PT SHORT TERM GOAL #5   Title Patient ambulates 100' with cane & prosthesis around furniture with supervision. (Target Date: 08/20/2016)   Time 1   Period Months   Status On-going     PT SHORT TERM GOAL #6   Title Patient reaches 10" anteriorly and to floor with cane support with supervision. (Target Date: 08/20/2016)   Time 1   Period Months   Status On-going     PT SHORT TERM GOAL #7   Title Patient performs Timed Up Go with cane safely < 38 seconds. (Target Date: 08/20/2016)   Time 1   Period Months   Status On-going           PT Long Term Goals - 08/06/16 1230      PT LONG TERM GOAL #1   Title Patient verbalizes & demonstrates understanding of prosthetic care with new prosthesis to enable safe use. (Target Date: 07/17/2016)   Baseline MET 07/14/2016   Status Achieved     PT LONG TERM GOAL #2   Title Patient tolerates wear of new prosthesis >90% of awake hours without skin issues to enable safe use throughout her day. (Target Date: 07/17/2016)     Period Months   Status Achieved     PT LONG TERM GOAL #3   Title Patient ambulates 400' with LRAD & new prosthesis modified independent for community mobility. (Target Date: 07/17/2016)   Baseline 07/23/16: met with RW    Status Achieved     PT LONG TERM GOAL #4   Title Patient negotiates ramps, curbs & stairs with LRAD & new prosthesis modified independent for community access. (Target Date: 07/17/2016)   Baseline 07/23/16: met with RW/rails   Status Achieved     PT LONG TERM GOAL #5   Title Patient ambulates around furniture 100' with LRAD (single UE support) and new prosthesis carrying cup of water modified independent. (Target Date: 07/17/2016)  UPDATED Target Date: 09/17/2016  Baseline 07/23/16: pt able to ambulate 100 ft with cane around obstacles with min guard/min assist   Status On-going     PT LONG TERM GOAL #6   Title Berg Balance > 45/56 to indicate lower fall risk. (Target Date: 07/17/2016) UPDATED Target Date: 09/17/2016   Baseline MET 07/14/2016 Berg Balance 43/56 with LTG >40/56; PT updated   Status On-going     PT LONG TERM GOAL #7   Title Timed Up & Go with LRAD (single UE support) & prosthesis <30sec to indicate lower fall risk. (Target Date: 07/17/2016)  UPDATED Target Date: 09/17/2016   Baseline NOT MET 07/14/2016  TUG with cane 43.8sec   Status On-going               Plan - 08/11/16 2200    Clinical Impression Statement Skilled PT session focused on balance recovery & stabilization activities with improvement with repetition & instruction.    Rehab Potential Good   PT Frequency 1x / week   PT Duration Other (comment)  9 weeks (60 days)   PT Treatment/Interventions ADLs/Self Care Home Management;DME Instruction;Gait training;Stair training;Functional mobility training;Therapeutic activities;Therapeutic exercise;Balance training;Neuromuscular re-education;Patient/family education;Prosthetic Training   PT Next Visit Plan Assess STGs, continue working with cane, balance activities   Consulted and Agree with Plan of Care Patient      Patient will benefit from skilled therapeutic intervention in order to improve the following deficits and impairments:  Abnormal gait, Decreased activity  tolerance, Decreased balance, Decreased endurance, Decreased knowledge of use of DME, Decreased range of motion, Decreased strength, Postural dysfunction, Prosthetic Dependency, Decreased mobility  Visit Diagnosis: Unsteadiness on feet  Other abnormalities of gait and mobility  Muscle weakness (generalized)     Problem List Patient Active Problem List   Diagnosis Date Noted  . Chronic systolic HF (heart failure) ([place]) 05/12/2016  . ACS (acute coronary syndrome) ([place]) 04/21/2016  . NSTEMI (non-ST elevated myocardial infarction) ([place]) 04/21/2016  . Brain aneurysm 04/13/2016  . Hypertension   . Dyslipidemia   . CAD in native artery   . Chest pain 01/29/2016  . PAC (premature atrial contraction) 08/14/2014  . PVC's (premature ventricular contractions) 08/14/2014  . Palpitations 11/02/2013  . Hypercholesteremia   . Essential hypertension, benign 11/01/2013  . Peripheral vascular disease, unspecified 11/01/2013    Jamey Reas PT, DPT 08/11/2016, 10:02 PM  [facility] [address] [place], [place], [address] Phone: [phone]   Fax:  [phone]  Name: AKIRE RENNERT MRN: [id number] Date of Birth: 1940/09/15

## 2016-08-18 ENCOUNTER — Ambulatory Visit: Payer: Medicare Other | Attending: Family Medicine | Admitting: Physical Therapy

## 2016-08-18 DIAGNOSIS — R2681 Unsteadiness on feet: Secondary | ICD-10-CM | POA: Insufficient documentation

## 2016-08-18 DIAGNOSIS — M6281 Muscle weakness (generalized): Secondary | ICD-10-CM | POA: Insufficient documentation

## 2016-08-18 DIAGNOSIS — M25651 Stiffness of right hip, not elsewhere classified: Secondary | ICD-10-CM | POA: Insufficient documentation

## 2016-08-18 DIAGNOSIS — R2689 Other abnormalities of gait and mobility: Secondary | ICD-10-CM | POA: Insufficient documentation

## 2016-08-18 NOTE — Therapy (Signed)
[facility] [address] [place], [place], [address] Phone: [phone]   Fax:  [phone]  Patient Details  Name: Anne NgShirley L Golding MRN: [id number] Date of Birth: 05-May-1941 Referring Provider:  Maurice SmallGriffin, [name], MD  Encounter Date: 08/18/2016  Patient arrived wearing old prosthesis as new one is at prosthetic company getting a cosmetic cover. She is scheduled to pick it up tomorrow. PT rescheduled appt to later time tomorrow.  Vladimir FasterWALDRON, PT, DPT 08/18/2016, 3:22 PM  [facility] [address] [place], [place], [address] Phone: [phone]   Fax:  [phone]

## 2016-08-19 ENCOUNTER — Ambulatory Visit: Payer: Medicare Other | Admitting: Physical Therapy

## 2016-08-19 ENCOUNTER — Encounter: Payer: Self-pay | Admitting: Physical Therapy

## 2016-08-19 DIAGNOSIS — M6281 Muscle weakness (generalized): Secondary | ICD-10-CM | POA: Diagnosis present

## 2016-08-19 DIAGNOSIS — R2689 Other abnormalities of gait and mobility: Secondary | ICD-10-CM | POA: Diagnosis not present

## 2016-08-19 DIAGNOSIS — R2681 Unsteadiness on feet: Secondary | ICD-10-CM | POA: Diagnosis present

## 2016-08-19 DIAGNOSIS — M25651 Stiffness of right hip, not elsewhere classified: Secondary | ICD-10-CM | POA: Diagnosis present

## 2016-08-19 NOTE — Therapy (Signed)
[facility] [address] [place], [place], [address] Phone: [phone]   Fax:  [phone]  Physical Therapy Treatment  Patient Details  Name: [name] MRN: [id number] Date of Birth: 1940-12-04 Referring Provider: Kelton Pillar, MD  Encounter Date: 08/19/2016      PT End of Session - 08/19/16 2204    Visit Number 19   Number of Visits 26   Date for PT Re-Evaluation 09/18/16   Authorization Type Medicare G-Code & progress note   PT Start Time 1400   PT Stop Time 1444   PT Time Calculation (min) 44 min   Equipment Utilized During Treatment Gait belt   Activity Tolerance Patient tolerated treatment well   Behavior During Therapy [facility] for tasks assessed/performed      Past Medical History:  Diagnosis Date  . ACS (acute coronary syndrome) ([place])   . Atypical chest pain    negative cardiolyte 09/2007 w Dr Radford Pax  . CAD in native artery    a. Cor CT 02/2016 - poor quality due to motion/poor filling of coronary arteries with tortuosity, 2 isolated foci in LM (nonobstructive), calcium score 23rd percentile, normal left dominant coronary arteries with noted motion artifact and poor distal vessel visualization.  . Dyslipidemia   . GERD (gastroesophageal reflux disease)   . Hypercholesteremia   . Hypertension   . NSTEMI (non-ST elevated myocardial infarction) ([place]) 04/2016  . PAC (premature atrial contraction)    Event monitors 10/2013 and 08/2014 showed NSR, ST, PACs, PVCs.   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 Posterior communicating artery aneurysm    followed by Dr Vertell Limber  . PVC's (premature ventricular contractions)    Event monitors 10/2013 and 08/2014 showed NSR, ST, PACs, PVCs.     Past Surgical History:  Procedure Laterality Date  . ABDOMINAL HYSTERECTOMY    . 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 CARDIAC  CATHETERIZATION N/A 04/21/2016   Procedure: Left Heart Cath and Coronary Angiography;  Surgeon: Jettie Booze, MD;  Location: [facility];  Service: Cardiovascular;  Laterality: N/A;  . IR GENERIC HISTORICAL  02/13/2016   IR RADIOLOGY PERIPHERAL GUIDED IV START 02/13/2016 Greggory Keen, MD [facility]  . IR GENERIC HISTORICAL  02/13/2016   IR US GUIDE VASC ACCESS LEFT 02/13/2016 Greggory Keen, MD [facility]    There were no vitals filed for this visit.      Subjective Assessment - 08/19/16 1408    Subjective She got the prosthesis covered so prosthetic knee is stiff.    Pertinent History PAD, HTN, systolic HF, CAD, MI, Brain aneurysm    Limitations Standing;Walking   Patient Stated Goals Walk in house with home with prosthesis with nothing or cane and community with walker   Currently in Pain? No/denies     Prosthetic Training with Transfemoral Prosthesis: Pt had prosthesis cosmetically covered and picked up prosthesis just prior to today's PT session. PT instructed to store at night for the first week in full flexion to stretch the foam cover and decrease the stiffness of prosthetic knee. Pt verbalized understanding. Pt ambulated 100' around furniture with cane with quad tip with supervision; pt negotiated around chairs and narrow space (12" area for feet) with cues to narrow stance and turn feet from pelvis.  Sit to stand 5X engaging prosthetic knee upon arising without touching to stabilize.  Progressed to stepping with LLE to target with full step length upon arising with cane support.  Timed Up & Go with cane 42.87sec and 46.88sec. Pt able to reach 10" anteriorly with cane support with supervision. PT demo reaching to floor with abducted step of prosthesis and LLE knee flexion 10 reps with supervision.  Pt ambulated 100' with cane with quad tip with supervision.  Tiimed Up & Go with cane with quad tip 42.87sec and 46.36sec with supervision. Patient reaches 10" anteriorly  with supervision. PT demo technique to pick up objects from floor with abducted step of prosthesis and LLE knee flexion. Pt performed 5 times with supervision. Patient ambulated 63' & 100' with cane with quad tip with supervision.                        PT Short Term Goals - 08/19/16 2204      PT SHORT TERM GOAL #1   Title Patient tolerates wear of new prosthesis daily >8 hrs total without pain or limb issues. (Target Date: 06/18/2016)   Baseline MET 06/18/2016   Time 1   Period Months   Status Achieved     PT SHORT TERM GOAL #2   Title Patient verbalizes proper cleaning & demonstrates proper donning of new prosthesis. (Target Date: 06/18/2016)   Baseline MET 06/18/2016   Time 1   Period Months   Status Achieved     PT SHORT TERM GOAL #3   Title Patient ambulates 300' with RW & prosthesis with cues on prosthesis control. (Target Date: 06/18/2016)   Baseline MET 06/18/2016   Time 1   Period Months   Status Achieved     PT SHORT TERM GOAL #4   Title Patient ambulates 74' with cane & prosthesis with minA. (Target Date: 06/18/2016)   Baseline MET 06/12/2016   Time 1   Period Months   Status Achieved     PT SHORT TERM GOAL #5   Title Patient ambulates 100' with cane & prosthesis around furniture with supervision. (Target Date: 08/20/2016)   Baseline MET 08/19/2016   Time 1   Period Months   Status Achieved     PT SHORT TERM GOAL #6   Title Patient reaches 10" anteriorly and to floor with cane support with supervision. (Target Date: 08/20/2016)   Baseline MET 08/19/2016   Time 1   Period Months   Status Achieved     PT SHORT TERM GOAL #7   Title Patient performs Timed Up Go with cane safely < 38 seconds. (Target Date: 08/20/2016)   Baseline NOT MET 08/19/2016 TUG with cane 42.87sec. She had prosthesis covered and picked it up from prosthetist just prior to PT session so knee is stiff.    Time 1   Period Months   Status Not Met           PT Long Term Goals - 08/06/16  1230      PT LONG TERM GOAL #1   Title Patient verbalizes & demonstrates understanding of prosthetic care with new prosthesis to enable safe use. (Target Date: 07/17/2016)   Baseline MET 07/14/2016   Status Achieved     PT LONG TERM GOAL #2   Title Patient tolerates wear of new prosthesis >90% of awake hours without skin issues to enable safe use throughout her day. (Target Date: 07/17/2016)     Period Months   Status Achieved     PT LONG TERM GOAL #3  Title Patient ambulates 400' with LRAD & new prosthesis modified independent for community mobility. (Target Date: 07/17/2016)   Baseline 07/23/16: met with RW   Status Achieved     PT LONG TERM GOAL #4   Title Patient negotiates ramps, curbs & stairs with LRAD & new prosthesis modified independent for community access. (Target Date: 07/17/2016)   Baseline 07/23/16: met with RW/rails   Status Achieved     PT LONG TERM GOAL #5   Title Patient ambulates around furniture 100' with LRAD (single UE support) and new prosthesis carrying cup of water modified independent. (Target Date: 07/17/2016)  UPDATED Target Date: 09/17/2016   Baseline 07/23/16: pt able to ambulate 100 ft with cane around obstacles with min guard/min assist   Status On-going     PT LONG TERM GOAL #6   Title Berg Balance > 45/56 to indicate lower fall risk. (Target Date: 07/17/2016) UPDATED Target Date: 09/17/2016   Baseline MET 07/14/2016 Berg Balance 43/56 with LTG >40/56; PT updated   Status On-going     PT LONG TERM GOAL #7   Title Timed Up & Go with LRAD (single UE support) & prosthesis <30sec to indicate lower fall risk. (Target Date: 07/17/2016)  UPDATED Target Date: 09/17/2016   Baseline NOT MET 07/14/2016  TUG with cane 43.8sec   Status On-going               Plan - 08/19/16 2209    Clinical Impression Statement Patient met 2 of 3 STGs set for this 30 day period. She had prosthesis covered cosmetically which stiffens prosthetic knee limiting flexion in swing. Patient was able  to recover balance with miss step resulting in flexion moment.    Rehab Potential Good   PT Frequency 1x / week   PT Duration Other (comment)  9 weeks (60 days)   PT Treatment/Interventions ADLs/Self Care Home Management;DME Instruction;Gait training;Stair training;Functional mobility training;Therapeutic activities;Therapeutic exercise;Balance training;Neuromuscular re-education;Patient/family education;Prosthetic Training   PT Next Visit Plan Do G-code with Berg & TUG, continue working with cane, balance activities   Consulted and Agree with Plan of Care Patient      Patient will benefit from skilled therapeutic intervention in order to improve the following deficits and impairments:  Abnormal gait, Decreased activity tolerance, Decreased balance, Decreased endurance, Decreased knowledge of use of DME, Decreased range of motion, Decreased strength, Postural dysfunction, Prosthetic Dependency, Decreased mobility  Visit Diagnosis: Other abnormalities of gait and mobility  Unsteadiness on feet  Muscle weakness (generalized)  Stiffness of right hip, not elsewhere classified     Problem List Patient Active Problem List   Diagnosis Date Noted  . Chronic systolic HF (heart failure) ([place]) 05/12/2016  . ACS (acute coronary syndrome) ([place]) 04/21/2016  . NSTEMI (non-ST elevated myocardial infarction) ([place]) 04/21/2016  . Brain aneurysm 04/13/2016  . Hypertension   . Dyslipidemia   . CAD in native artery   . Chest pain 01/29/2016  . PAC (premature atrial contraction) 08/14/2014  . PVC's (premature ventricular contractions) 08/14/2014  . Palpitations 11/02/2013  . Hypercholesteremia   . Essential hypertension, benign 11/01/2013  . Peripheral vascular disease, unspecified 11/01/2013    Jamey Reas PT, DPT 08/19/2016, 10:16 PM  [facility] [address] [place], [place], [address] Phone: [phone]   Fax:   [phone]  Name: [name] MRN: [id number] Date of Birth: August 25, 1940

## 2016-08-25 ENCOUNTER — Ambulatory Visit: Payer: Medicare Other | Admitting: Physical Therapy

## 2016-08-25 ENCOUNTER — Encounter: Payer: Self-pay | Admitting: Physical Therapy

## 2016-08-25 DIAGNOSIS — M25651 Stiffness of right hip, not elsewhere classified: Secondary | ICD-10-CM

## 2016-08-25 DIAGNOSIS — R2689 Other abnormalities of gait and mobility: Secondary | ICD-10-CM | POA: Diagnosis not present

## 2016-08-25 DIAGNOSIS — R2681 Unsteadiness on feet: Secondary | ICD-10-CM

## 2016-08-25 DIAGNOSIS — M6281 Muscle weakness (generalized): Secondary | ICD-10-CM

## 2016-08-26 NOTE — Therapy (Signed)
[facility] [address] [place], [place], [address] Phone: [phone]   Fax:  [phone]  Physical Therapy Treatment  Patient Details  Name: [name] MRN: [id number] Date of Birth: 11/21/1940 Referring Provider: [name] Griffin, MD  Encounter Date: 08/25/2016      PT End of Session - 08/25/16 1847    Visit Number 20   Number of Visits 26   Date for PT Re-Evaluation 09/18/16   Authorization Type Medicare G-Code & progress note   PT Start Time 1446   PT Stop Time 1530   PT Time Calculation (min) 44 min   Equipment Utilized During Treatment Gait belt   Activity Tolerance Patient tolerated treatment well   Behavior During Therapy WFL for tasks assessed/performed      Past Medical History:  Diagnosis Date  . ACS (acute coronary syndrome) ([facility])   . Atypical chest pain    negative cardiolyte 09/2007 w Dr Turner  . CAD in native artery    a. Cor CT 02/2016 - poor quality due to motion/poor filling of coronary arteries with tortuosity, 2 isolated foci in LM (nonobstructive), calcium score 23rd percentile, normal left dominant coronary arteries with noted motion artifact and poor distal vessel visualization.  . Dyslipidemia   . GERD (gastroesophageal reflux disease)   . Hypercholesteremia   . Hypertension   . NSTEMI (non-ST elevated myocardial infarction) ([facility]) 04/2016  . PAC (premature atrial contraction)    Event monitors 10/2013 and 08/2014 showed NSR, ST, PACs, PVCs.   . 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 Posterior communicating artery aneurysm    followed by Dr Stern  . PVC's (premature ventricular contractions)    Event monitors 10/2013 and 08/2014 showed NSR, ST, PACs, PVCs.     Past Surgical History:  Procedure Laterality Date  . ABDOMINAL HYSTERECTOMY    . 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 CARDIAC  CATHETERIZATION N/A 04/21/2016   Procedure: Left Heart Cath and Coronary Angiography;  Surgeon: Jayadeep S Varanasi, MD;  [facility];  Service: Cardiovascular;  Laterality: N/A;  . IR GENERIC HISTORICAL  02/13/2016   IR RADIOLOGY PERIPHERAL GUIDED IV START 02/13/2016 Michael Shick, MD [facility]  . IR GENERIC HISTORICAL  02/13/2016   IR US GUIDE VASC ACCESS LEFT 02/13/2016 Michael Shick, MD [facility]    There were no vitals filed for this visit.      Subjective Assessment - 08/25/16 1456    Subjective She has been stretching the prosthetic cover at night as PT advised and it seems to help. No falls.    Pertinent History PAD, HTN, systolic HF, CAD, MI, Brain aneurysm    Limitations Standing;Walking   Patient Stated Goals Walk in house with home with prosthesis with nothing or cane and community with walker   Currently in Pain? No/denies            [facility] PT Assessment - 08/25/16 1445      Berg Balance Test   Sit to Stand Able to stand  independently using hands   Standing Unsupported Able to stand safely 2 minutes   Sitting with Back Unsupported but Feet Supported on Floor or Stool Able to sit safely and securely 2 minutes   Stand to Sit Sits safely with minimal use of hands   Transfers Able to transfer safely, minor use of   hands   Standing Unsupported with Eyes Closed Able to stand 10 seconds safely   Standing Ubsupported with Feet Together Able to place feet together independently and stand 1 minute safely   From Standing, Reach Forward with Outstretched Arm Can reach confidently >25 cm (10")   From Standing Position, Pick up Object from [place] to pick up shoe safely and easily   From Standing Position, Turn to Look Behind Over each Shoulder Looks behind one side only/other side shows less weight shift   Turn 360 Degrees Able to turn 360 degrees safely but slowly   Standing Unsupported, Alternately Place Feet on Step/Stool Able to complete >2 steps/needs  minimal assist   Standing Unsupported, One Foot in Front Able to take small step independently and hold 30 seconds   Standing on One Leg Able to lift leg independently and hold equal to or more than 3 seconds   Total Score 45   Berg comment: Initial was 10/56, On 12/12 was 36/56. On 1/2 was 43/56     Timed Up and Go Test   Normal TUG (seconds) 42.9  42.9sec with cane; 35.1 sec with hand hold & cane     Prosthetic Training: RW support: PT instructed with tactile, verbal & demo terminal stance weight shift to load prosthetic toe to unlock prosthetic knee; progressed to unlocking then advancing prosthesis with full step with initial contact of heel past toe of sound foot. Progressed to gait with RW with cues for unlocking knee without excessive pelvic rotation and sound limb internal rotation.  Progressed to gait with cane: Pt required contact assist / tactile cues for prosthetic knee control & unlocking prosthesis.                           PT Short Term Goals - 08/19/16 2204      PT SHORT TERM GOAL #1   Title Patient tolerates wear of new prosthesis daily >8 hrs total without pain or limb issues. (Target Date: 06/18/2016)   Baseline MET 06/18/2016   Time 1   Period Months   Status Achieved     PT SHORT TERM GOAL #2   Title Patient verbalizes proper cleaning & demonstrates proper donning of new prosthesis. (Target Date: 06/18/2016)   Baseline MET 06/18/2016   Time 1   Period Months   Status Achieved     PT SHORT TERM GOAL #3   Title Patient ambulates 300' with RW & prosthesis with cues on prosthesis control. (Target Date: 06/18/2016)   Baseline MET 06/18/2016   Time 1   Period Months   Status Achieved     PT SHORT TERM GOAL #4   Title Patient ambulates 37' with cane & prosthesis with minA. (Target Date: 06/18/2016)   Baseline MET 06/12/2016   Time 1   Period Months   Status Achieved     PT SHORT TERM GOAL #5   Title Patient ambulates 100' with cane &  prosthesis around furniture with supervision. (Target Date: 08/20/2016)   Baseline MET 08/19/2016   Time 1   Period Months   Status Achieved     PT SHORT TERM GOAL #6   Title Patient reaches 10" anteriorly and to floor with cane support with supervision. (Target Date: 08/20/2016)   Baseline MET 08/19/2016   Time 1   Period Months   Status Achieved     PT SHORT TERM GOAL #7   Title Patient performs Timed Up Go  with cane safely < 38 seconds. (Target Date: 08/20/2016)   Baseline NOT MET 08/19/2016 TUG with cane 42.87sec. She had prosthesis covered and picked it up from prosthetist just prior to PT session so knee is stiff.    Time 1   Period Months   Status Not Met           PT Long Term Goals - 08/06/16 1230      PT LONG TERM GOAL #1   Title Patient verbalizes & demonstrates understanding of prosthetic care with new prosthesis to enable safe use. (Target Date: 07/17/2016)   Baseline MET 07/14/2016   Status Achieved     PT LONG TERM GOAL #2   Title Patient tolerates wear of new prosthesis >90% of awake hours without skin issues to enable safe use throughout her day. (Target Date: 07/17/2016)     Period Months   Status Achieved     PT LONG TERM GOAL #3   Title Patient ambulates 400' with LRAD & new prosthesis modified independent for community mobility. (Target Date: 07/17/2016)   Baseline 07/23/16: met with RW   Status Achieved     PT LONG TERM GOAL #4   Title Patient negotiates ramps, curbs & stairs with LRAD & new prosthesis modified independent for community access. (Target Date: 07/17/2016)   Baseline 07/23/16: met with RW/rails   Status Achieved     PT LONG TERM GOAL #5   Title Patient ambulates around furniture 100' with LRAD (single UE support) and new prosthesis carrying cup of water modified independent. (Target Date: 07/17/2016)  UPDATED Target Date: 09/17/2016   Baseline 07/23/16: pt able to ambulate 100 ft with cane around obstacles with min guard/min assist   Status On-going     PT  LONG TERM GOAL #6   Title Berg Balance > 45/56 to indicate lower fall risk. (Target Date: 07/17/2016) UPDATED Target Date: 09/17/2016   Baseline MET 07/14/2016 Berg Balance 43/56 with LTG >40/56; PT updated   Status On-going     PT LONG TERM GOAL #7   Title Timed Up & Go with LRAD (single UE support) & prosthesis <30sec to indicate lower fall risk. (Target Date: 07/17/2016)  UPDATED Target Date: 09/17/2016   Baseline NOT MET 07/14/2016  TUG with cane 43.8sec   Status On-going               Plan - 08/25/16 1849    Clinical Impression Statement Patient improved knee control with unlocking with RW support. She appears to understand how to work on knee flexion at home. Pt improved Berg Balance to 45/56 but no noted difference in TUG with cane. This appears to be related to step-to pattern.    Rehab Potential Good   PT Frequency 1x / week   PT Duration Other (comment)  9 weeks (60 days)   PT Treatment/Interventions ADLs/Self Care Home Management;DME Instruction;Gait training;Stair training;Functional mobility training;Therapeutic activities;Therapeutic exercise;Balance training;Neuromuscular re-education;Patient/family education;Prosthetic Training   PT Next Visit Plan continue working with cane, balance activities towards LTGs   Consulted and Agree with Plan of Care Patient      Patient will benefit from skilled therapeutic intervention in order to improve the following deficits and impairments:  Abnormal gait, Decreased activity tolerance, Decreased balance, Decreased endurance, Decreased knowledge of use of DME, Decreased range of motion, Decreased strength, Postural dysfunction, Prosthetic Dependency, Decreased mobility  Visit Diagnosis: Other abnormalities of gait and mobility  Unsteadiness on feet  Muscle weakness (generalized)  Stiffness of right hip, not   elsewhere classified       G-Codes - 09/09/16 1853    Functional Assessment Tool Used Berg Balance 45/56 & Timed Up-Go with RW  28.72sec, with cane 42.9sec and with cane & Hand Hold assist 35.1sec   Functional Limitation Mobility: Walking and moving around   Mobility: Walking and Moving Around Current Status [phone]) At least 40 percent but less than 60 percent impaired, limited or restricted   Mobility: Walking and Moving Around Goal Status [phone]) At least 20 percent but less than 40 percent impaired, limited or restricted      Problem List Patient Active Problem List   Diagnosis Date Noted  . Chronic systolic HF (heart failure) ([place]) 05/12/2016  . ACS (acute coronary syndrome) ([place]) 04/21/2016  . NSTEMI (non-ST elevated myocardial infarction) ([place]) 04/21/2016  . Brain aneurysm 04/13/2016  . Hypertension   . Dyslipidemia   . CAD in native artery   . Chest pain 01/29/2016  . PAC (premature atrial contraction) 08/14/2014  . PVC's (premature ventricular contractions) 08/14/2014  . Palpitations 11/02/2013  . Hypercholesteremia   . Essential hypertension, benign 11/01/2013  . Peripheral vascular disease, unspecified 11/01/2013    , PT, DPT 08/26/2016, 10:55 AM  [facility] [address] [place], [place], [address] Phone: [phone]   Fax:  [phone]  Name: [name] MRN: [id number] Date of Birth: Dec 19, 1940

## 2016-09-01 ENCOUNTER — Encounter: Payer: Self-pay | Admitting: Physical Therapy

## 2016-09-01 ENCOUNTER — Ambulatory Visit: Payer: Medicare Other | Admitting: Physical Therapy

## 2016-09-01 DIAGNOSIS — R2689 Other abnormalities of gait and mobility: Secondary | ICD-10-CM

## 2016-09-01 DIAGNOSIS — M6281 Muscle weakness (generalized): Secondary | ICD-10-CM

## 2016-09-01 DIAGNOSIS — R2681 Unsteadiness on feet: Secondary | ICD-10-CM

## 2016-09-01 DIAGNOSIS — M25651 Stiffness of right hip, not elsewhere classified: Secondary | ICD-10-CM

## 2016-09-01 NOTE — Patient Instructions (Addendum)
Hip AROM: Flexion / Extension    Roll to sound side. Bring residual limb to chest, then reach limb back as far as possible. Hold 5-10 seconds for stretch.  Repeat __5__ times. Do __1-2__ sessions per day.  Copyright  VHI. All rights reserved.  Slow Contraction: Gravity Resisted (Half-Kneeling)    Place a chair without armrests facing counter, table or bed. Turn sideways on chair so right butt cheek is off chair. 1.  Push your right leg back behind you. Keep trunk up tall. Hold 5-10 seconds. Do 5-10 times.  2.  Push your right knee towards the floor. Hold stretch 5-10 seconds. Do 5-10 times.   Copyright  VHI. All rights reserved.  Turning    Stand between counter & chairback. Turn clockwise & counterclockwise in least number of steps.  Repeat __2-3 __ times each way.   Copyright  VHI. All rights reserved.  Standing    Open lower cabinet.  Place left forefoot on lower shelf. Try to let go of counter for 10-30 seconds.  Then touch prosthesis toe to lower cabinet. Hold as long as possible. Alternate legs for 3-5 times per leg.   Copyright  VHI. All rights reserved.  Feet Partial Heel-Toe, Head Motion - Eyes Open    Stand with foot in front of cane or line on floor. Hold position up to 30 seconds.  Repeat with other foot in front.  Advanced is move head slowly: look up & down  And look right & left.    Copyright  VHI. All rights reserved.  AMBULATION: Upright Posture    Near counter with cane. Walk with upright posture taking full, long steps.  Advance by not touching the counter.   Copyright  VHI. All rights reserved.  AMBULATION: Walk Backward    Walk backward. Take large steps, do not drag feet.   Copyright  VHI. All rights reserved.

## 2016-09-01 NOTE — Therapy (Signed)
[facility] [address] [place], [place], [address] Phone: [phone]   Fax:  [phone]  Physical Therapy Treatment  Patient Details  Name: [name] MRN: [id number] Date of Birth: [birth year] Referring Provider: Kelton Pillar, MD  Encounter Date: 09/01/2016      PT End of Session - 09/01/16 2227    Visit Number 21   Number of Visits 26   Date for PT Re-Evaluation 09/18/16   Authorization Type Medicare G-Code & progress note   PT Start Time 1445   PT Stop Time 1531   PT Time Calculation (min) 46 min   Equipment Utilized During Treatment Gait belt   Activity Tolerance Patient tolerated treatment well   Behavior During Therapy [facility] for tasks assessed/performed      Past Medical History:  Diagnosis Date  . ACS (acute coronary syndrome) ([place])   . Atypical chest pain    negative cardiolyte 09/2007 w Dr Radford Pax  . CAD in native artery    a. Cor CT 02/2016 - poor quality due to motion/poor filling of coronary arteries with tortuosity, 2 isolated foci in LM (nonobstructive), calcium score 23rd percentile, normal left dominant coronary arteries with noted motion artifact and poor distal vessel visualization.  . Dyslipidemia   . GERD (gastroesophageal reflux disease)   . Hypercholesteremia   . Hypertension   . NSTEMI (non-ST elevated myocardial infarction) ([place]) 04/2016  . PAC (premature atrial contraction)    Event monitors 10/2013 and 08/2014 showed NSR, ST, PACs, PVCs.   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 Posterior communicating artery aneurysm    followed by Dr Vertell Limber  . PVC's (premature ventricular contractions)    Event monitors 10/2013 and 08/2014 showed NSR, ST, PACs, PVCs.     Past Surgical History:  Procedure Laterality Date  . ABDOMINAL HYSTERECTOMY    . 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 CARDIAC  CATHETERIZATION N/A 04/21/2016   Procedure: Left Heart Cath and Coronary Angiography;  Surgeon: Jettie Booze, MD;  Location: [place] [facility];  Service: Cardiovascular;  Laterality: N/A;  . IR GENERIC HISTORICAL  02/13/2016   IR RADIOLOGY PERIPHERAL GUIDED IV START 02/13/2016 Greggory Keen, MD [facility]  . IR GENERIC HISTORICAL  02/13/2016   IR US GUIDE VASC ACCESS LEFT 02/13/2016 Greggory Keen, MD [facility]    There were no vitals filed for this visit.      Subjective Assessment - 09/01/16 1454    Subjective She went to [facility] for a funeral. No issues or falls.    Pertinent History PAD, HTN, systolic HF, CAD, MI, Brain aneurysm    Limitations Standing;Walking   Patient Stated Goals Walk in house with home with prosthesis with nothing or cane and community with walker   Currently in Pain? No/denies      Hip AROM: Flexion / Extension    Roll to sound side. Bring residual limb to chest, then reach limb back as far as possible. Hold 5-10 seconds for stretch.  Repeat __5__ times. Do __1-2__ sessions per day.  Copyright  VHI. All rights reserved.  Slow Contraction: Gravity Resisted (Half-Kneeling)    Place a chair without armrests facing counter, table or bed. Turn sideways on chair so right butt cheek is off chair. 1.  Push your right leg back behind you. Keep trunk up tall. Hold 5-10 seconds. Do  5-10 times.  2.  Push your right knee towards the floor. Hold stretch 5-10 seconds. Do 5-10 times.   Copyright  VHI. All rights reserved.  Turning    Stand between counter & chairback. Turn clockwise & counterclockwise in least number of steps.  Repeat __2-3 __ times each way.   Copyright  VHI. All rights reserved.  Standing    Open lower cabinet.  Place left forefoot on lower shelf. Try to let go of counter for 10-30 seconds.  Then touch prosthesis toe to lower cabinet. Hold as long as possible. Alternate legs for 3-5 times per leg.   Copyright  VHI. All  rights reserved.  Feet Partial Heel-Toe, Head Motion - Eyes Open    Stand with foot in front of cane or line on floor. Hold position up to 30 seconds.  Repeat with other foot in front.  Advanced is move head slowly: look up & down  And look right & left.    Copyright  VHI. All rights reserved.  AMBULATION: Upright Posture    Near counter with cane. Walk with upright posture taking full, long steps.  Advance by not touching the counter.   Copyright  VHI. All rights reserved.  AMBULATION: Walk Backward    Walk backward. Take large steps, do not drag feet.   Copyright  VHI. All rights reserved.   Pt ambulated 3' & 100' with single point cane with close supervision, tactile & verbal cues for step length and upright posture.                            PT Education - 09/01/16 1445    Education provided Yes   Education Details phantom sensation vs phantom pain. Monitoring triggers. Attempt keeping limb warm when prosthesis off. Use of lotion at night & removing in morning prior to donning prosthesis. Updated HEP to include hip flexor stretch / active hip extension.    Person(s) Educated Patient   Methods Explanation;Demonstration;Tactile cues;Verbal cues;Handout   Comprehension Verbalized understanding;Returned demonstration;Verbal cues required;Tactile cues required;Need further instruction          PT Short Term Goals - 08/19/16 2204      PT SHORT TERM GOAL #1   Title Patient tolerates wear of new prosthesis daily >8 hrs total without pain or limb issues. (Target Date: 06/18/2016)   Baseline MET 06/18/2016   Time 1   Period Months   Status Achieved     PT SHORT TERM GOAL #2   Title Patient verbalizes proper cleaning & demonstrates proper donning of new prosthesis. (Target Date: 06/18/2016)   Baseline MET 06/18/2016   Time 1   Period Months   Status Achieved     PT SHORT TERM GOAL #3   Title Patient ambulates 300' with RW & prosthesis with  cues on prosthesis control. (Target Date: 06/18/2016)   Baseline MET 06/18/2016   Time 1   Period Months   Status Achieved     PT SHORT TERM GOAL #4   Title Patient ambulates 37' with cane & prosthesis with minA. (Target Date: 06/18/2016)   Baseline MET 06/12/2016   Time 1   Period Months   Status Achieved     PT SHORT TERM GOAL #5   Title Patient ambulates 100' with cane & prosthesis around furniture with supervision. (Target Date: 08/20/2016)   Baseline MET 08/19/2016   Time 1   Period Months   Status Achieved  PT SHORT TERM GOAL #6   Title Patient reaches 10" anteriorly and to floor with cane support with supervision. (Target Date: 08/20/2016)   Baseline MET 08/19/2016   Time 1   Period Months   Status Achieved     PT SHORT TERM GOAL #7   Title Patient performs Timed Up Go with cane safely < 38 seconds. (Target Date: 08/20/2016)   Baseline NOT MET 08/19/2016 TUG with cane 42.87sec. She had prosthesis covered and picked it up from prosthetist just prior to PT session so knee is stiff.    Time 1   Period Months   Status Not Met           PT Long Term Goals - 08/06/16 1230      PT LONG TERM GOAL #1   Title Patient verbalizes & demonstrates understanding of prosthetic care with new prosthesis to enable safe use. (Target Date: 07/17/2016)   Baseline MET 07/14/2016   Status Achieved     PT LONG TERM GOAL #2   Title Patient tolerates wear of new prosthesis >90% of awake hours without skin issues to enable safe use throughout her day. (Target Date: 07/17/2016)     Period Months   Status Achieved     PT LONG TERM GOAL #3   Title Patient ambulates 400' with LRAD & new prosthesis modified independent for community mobility. (Target Date: 07/17/2016)   Baseline 07/23/16: met with RW   Status Achieved     PT LONG TERM GOAL #4   Title Patient negotiates ramps, curbs & stairs with LRAD & new prosthesis modified independent for community access. (Target Date: 07/17/2016)   Baseline 07/23/16: met  with RW/rails   Status Achieved     PT LONG TERM GOAL #5   Title Patient ambulates around furniture 100' with LRAD (single UE support) and new prosthesis carrying cup of water modified independent. (Target Date: 07/17/2016)  UPDATED Target Date: 09/17/2016   Baseline 07/23/16: pt able to ambulate 100 ft with cane around obstacles with min guard/min assist   Status On-going     PT LONG TERM GOAL #6   Title Berg Balance > 45/56 to indicate lower fall risk. (Target Date: 07/17/2016) UPDATED Target Date: 09/17/2016   Baseline MET 07/14/2016 Berg Balance 43/56 with LTG >40/56; PT updated   Status On-going     PT LONG TERM GOAL #7   Title Timed Up & Go with LRAD (single UE support) & prosthesis <30sec to indicate lower fall risk. (Target Date: 07/17/2016)  UPDATED Target Date: 09/17/2016   Baseline NOT MET 07/14/2016  TUG with cane 43.8sec   Status On-going               Plan - 09/01/16 2227    Clinical Impression Statement Patient seems to understand updated HEP. Patient improved gait with cane with step through pattern initially near counter & progressed to contact assist from PT in open area.    Rehab Potential Good   PT Frequency 1x / week   PT Duration Other (comment)  9 weeks (60 days)   PT Treatment/Interventions ADLs/Self Care Home Management;DME Instruction;Gait training;Stair training;Functional mobility training;Therapeutic activities;Therapeutic exercise;Balance training;Neuromuscular re-education;Patient/family education;Prosthetic Training   PT Next Visit Plan continue working with cane, balance activities towards LTGs   Consulted and Agree with Plan of Care Patient      Patient will benefit from skilled therapeutic intervention in order to improve the following deficits and impairments:  Abnormal gait, Decreased activity tolerance, Decreased balance, Decreased  endurance, Decreased knowledge of use of DME, Decreased range of motion, Decreased strength, Postural dysfunction, Prosthetic  Dependency, Decreased mobility  Visit Diagnosis: Other abnormalities of gait and mobility  Unsteadiness on feet  Muscle weakness (generalized)  Stiffness of right hip, not elsewhere classified     Problem List Patient Active Problem List   Diagnosis Date Noted  . Chronic systolic HF (heart failure) ([place]) 05/12/2016  . ACS (acute coronary syndrome) ([place]) 04/21/2016  . NSTEMI (non-ST elevated myocardial infarction) ([place]) 04/21/2016  . Brain aneurysm 04/13/2016  . Hypertension   . Dyslipidemia   . CAD in native artery   . Chest pain 01/29/2016  . PAC (premature atrial contraction) 08/14/2014  . PVC's (premature ventricular contractions) 08/14/2014  . Palpitations 11/02/2013  . Hypercholesteremia   . Essential hypertension, benign 11/01/2013  . Peripheral vascular disease, unspecified 11/01/2013    Jamey Reas PT, DPT 09/01/2016, 10:35 PM  [facility] [address] [place], [place], [address] Phone: [phone]   Fax:  [phone]  Name: [name] MRN: [id number] Date of Birth: 06-06-1941

## 2016-09-02 ENCOUNTER — Other Ambulatory Visit: Payer: Self-pay | Admitting: Cardiology

## 2016-09-08 ENCOUNTER — Ambulatory Visit: Payer: Medicare Other | Admitting: Physical Therapy

## 2016-09-10 ENCOUNTER — Ambulatory Visit: Payer: Medicare Other | Admitting: Physical Therapy

## 2016-09-15 ENCOUNTER — Other Ambulatory Visit: Payer: Self-pay | Admitting: Family Medicine

## 2016-09-15 DIAGNOSIS — Z1231 Encounter for screening mammogram for malignant neoplasm of breast: Secondary | ICD-10-CM

## 2016-09-17 ENCOUNTER — Ambulatory Visit: Payer: Medicare Other | Attending: Family Medicine | Admitting: Physical Therapy

## 2016-09-17 ENCOUNTER — Encounter: Payer: Self-pay | Admitting: Physical Therapy

## 2016-09-17 DIAGNOSIS — M6281 Muscle weakness (generalized): Secondary | ICD-10-CM | POA: Diagnosis present

## 2016-09-17 DIAGNOSIS — R2689 Other abnormalities of gait and mobility: Secondary | ICD-10-CM

## 2016-09-17 DIAGNOSIS — R2681 Unsteadiness on feet: Secondary | ICD-10-CM | POA: Diagnosis present

## 2016-09-17 NOTE — Therapy (Signed)
[facility] [address] [place], [place], [address] Phone: [phone]   Fax:  [phone]  Physical Therapy Treatment  Patient Details  Name: [name] MRN: [id number] Date of Birth: [birth year] Referring Provider: Kelton Pillar, MD  Encounter Date: 09/17/2016      PT End of Session - 09/17/16 2134    Visit Number 22   Number of Visits 26   Date for PT Re-Evaluation 09/18/16   Authorization Type Medicare G-Code & progress note   PT Start Time 1315   PT Stop Time 1401   PT Time Calculation (min) 46 min   Activity Tolerance Patient tolerated treatment well   Behavior During Therapy [facility] for tasks assessed/performed      Past Medical History:  Diagnosis Date  . ACS (acute coronary syndrome) ([place])   . Atypical chest pain    negative cardiolyte 09/2007 w Dr Radford Pax  . CAD in native artery    a. Cor CT 02/2016 - poor quality due to motion/poor filling of coronary arteries with tortuosity, 2 isolated foci in LM (nonobstructive), calcium score 23rd percentile, normal left dominant coronary arteries with noted motion artifact and poor distal vessel visualization.  . Dyslipidemia   . GERD (gastroesophageal reflux disease)   . Hypercholesteremia   . Hypertension   . NSTEMI (non-ST elevated myocardial infarction) ([place]) 04/2016  . PAC (premature atrial contraction)    Event monitors 10/2013 and 08/2014 showed NSR, ST, PACs, PVCs.   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 Posterior communicating artery aneurysm    followed by Dr Vertell Limber  . PVC's (premature ventricular contractions)    Event monitors 10/2013 and 08/2014 showed NSR, ST, PACs, PVCs.     Past Surgical History:  Procedure Laterality Date  . ABDOMINAL HYSTERECTOMY    . 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 CARDIAC CATHETERIZATION N/A 04/21/2016   Procedure: Left Heart  Cath and Coronary Angiography;  Surgeon: Jettie Booze, MD;  Location: [place] [facility];  Service: Cardiovascular;  Laterality: N/A;  . IR GENERIC HISTORICAL  02/13/2016   IR RADIOLOGY PERIPHERAL GUIDED IV START 02/13/2016 Greggory Keen, MD [facility]  . IR GENERIC HISTORICAL  02/13/2016   IR US GUIDE VASC ACCESS LEFT 02/13/2016 Greggory Keen, MD [facility]    There were no vitals filed for this visit.      Subjective Assessment - 09/17/16 1314    Subjective No falls. She has been storing overnight covered prosthesis with knee bent which is making it easier to bend.    Pertinent History PAD, HTN, systolic HF, CAD, MI, Brain aneurysm    Limitations Standing;Walking   Patient Stated Goals Walk in house with home with prosthesis with nothing or cane and community with walker   Currently in Pain? No/denies            [facility] PT Assessment - 09/17/16 1315      Observation/Other Assessments   Focus on Therapeutic Outcomes (FOTO)  45.72   Activities of Balance Confidence Scale (ABC Scale)  41.2%   Initial ABC was 35.0%   Fear Avoidance Belief Questionnaire (FABQ)  66 (16)     Ambulation/Gait   Ambulation/Gait Yes   Ambulation/Gait Assistance 6: Modified independent (Device/Increase time)   Ambulation/Gait Assistance Details able to turn head to scan ambulating with cane without balance loss. able to carry  cup of water around furniture with cane safely.    Ambulation Distance (Feet) 120 Feet   Assistive device Straight cane;Prosthesis   Gait Pattern Step-through pattern;Decreased arm swing - right;Decreased step length - left;Decreased stance time - right;Decreased stride length;Decreased hip/knee flexion - right;Abducted - left  pt flexing prosthetic knee to advance with RW & cane   Ambulation Surface Indoor;Level   Gait velocity 1.45 ft/sec RW comfortable & 1.82 ft/sec RW fast;  1.18f/sec with cane comfortable   Stairs Yes   Stairs Assistance 6: Modified independent  (Device/Increase time)   Stair Management Technique Two rails;Step to pattern;Forwards   Ramp 6: Modified independent (Device)  RW & prosthesis   Curb 6: Modified independent (Device/increase time)  RW & prosthesis     Berg Balance Test   Sit to Stand Able to stand  independently using hands   Standing Unsupported Able to stand safely 2 minutes   Sitting with Back Unsupported but Feet Supported on Floor or Stool Able to sit safely and securely 2 minutes   Stand to Sit Sits safely with minimal use of hands   Transfers Able to transfer safely, minor use of hands   Standing Unsupported with Eyes Closed Able to stand 10 seconds safely   Standing Ubsupported with Feet Together Able to place feet together independently and stand 1 minute safely   From Standing, Reach Forward with Outstretched Arm Can reach confidently >25 cm (10")   From Standing Position, Pick up Object from Floor Able to pick up shoe safely and easily   From Standing Position, Turn to Look Behind Over each Shoulder Looks behind one side only/other side shows less weight shift   Turn 360 Degrees Able to turn 360 degrees safely but slowly   Standing Unsupported, Alternately Place Feet on Step/Stool Able to complete 4 steps without aid or supervision   Standing Unsupported, One Foot in Front Able to plae foot ahead of the other independently and hold 30 seconds   Standing on One Leg Able to lift leg independently and hold equal to or more than 3 seconds   Total Score 47     Timed Up and Go Test   Normal TUG (seconds) 32.99  cane                               PT Short Term Goals - 08/19/16 2204      PT SHORT TERM GOAL #1   Title Patient tolerates wear of new prosthesis daily >8 hrs total without pain or limb issues. (Target Date: 06/18/2016)   Baseline MET 06/18/2016   Time 1   Period Months   Status Achieved     PT SHORT TERM GOAL #2   Title Patient verbalizes proper cleaning & demonstrates  proper donning of new prosthesis. (Target Date: 06/18/2016)   Baseline MET 06/18/2016   Time 1   Period Months   Status Achieved     PT SHORT TERM GOAL #3   Title Patient ambulates 300' with RW & prosthesis with cues on prosthesis control. (Target Date: 06/18/2016)   Baseline MET 06/18/2016   Time 1   Period Months   Status Achieved     PT SHORT TERM GOAL #4   Title Patient ambulates 717 with cane & prosthesis with minA. (Target Date: 06/18/2016)   Baseline MET 06/12/2016   Time 1   Period Months   Status Achieved     PT  SHORT TERM GOAL #5   Title Patient ambulates 100' with cane & prosthesis around furniture with supervision. (Target Date: 08/20/2016)   Baseline MET 08/19/2016   Time 1   Period Months   Status Achieved     PT SHORT TERM GOAL #6   Title Patient reaches 10" anteriorly and to floor with cane support with supervision. (Target Date: 08/20/2016)   Baseline MET 08/19/2016   Time 1   Period Months   Status Achieved     PT SHORT TERM GOAL #7   Title Patient performs Timed Up Go with cane safely < 38 seconds. (Target Date: 08/20/2016)   Baseline NOT MET 08/19/2016 TUG with cane 42.87sec. She had prosthesis covered and picked it up from prosthetist just prior to PT session so knee is stiff.    Time 1   Period Months   Status Not Met           PT Long Term Goals - 09/17/16 2138      PT LONG TERM GOAL #1   Title Patient verbalizes & demonstrates understanding of prosthetic care with new prosthesis to enable safe use. (Target Date: 07/17/2016)   Baseline MET 07/14/2016   Status Achieved     PT LONG TERM GOAL #2   Title Patient tolerates wear of new prosthesis >90% of awake hours without skin issues to enable safe use throughout her day. (Target Date: 07/17/2016)     Period Months   Status Achieved     PT LONG TERM GOAL #3   Title Patient ambulates 400' with LRAD & new prosthesis modified independent for community mobility. (Target Date: 07/17/2016)   Baseline 07/23/16: met with  RW   Status Achieved     PT LONG TERM GOAL #4   Title Patient negotiates ramps, curbs & stairs with LRAD & new prosthesis modified independent for community access. (Target Date: 07/17/2016)   Baseline 07/23/16: met with RW/rails   Status Achieved     PT LONG TERM GOAL #5   Title Patient ambulates around furniture 100' with LRAD (single UE support) and new prosthesis carrying cup of water modified independent. (Target Date: 07/17/2016)  UPDATED Target Date: 09/17/2016   Baseline MET 09/16/2016 with cane   Status Achieved     PT LONG TERM GOAL #6   Title Berg Balance > 45/56 to indicate lower fall risk. (Target Date: 07/17/2016) UPDATED Target Date: 09/17/2016   Baseline MET 09/16/2016 Berg Balance 47/56   Status Achieved     PT LONG TERM GOAL #7   Title Timed Up & Go with LRAD (single UE support) & prosthesis <30sec to indicate lower fall risk. (Target Date: 07/17/2016)  UPDATED Target Date: 09/17/2016   Baseline Partially MET 09/16/2016 TUG improved to 32.99sec   Status Partially Met               Plan - 09/17/16 2140    Clinical Impression Statement Patient met or partially met LTGs. She appears safe to function at household level with cane and at community level with RW with her new prosthesis.    Rehab Potential Good   PT Frequency 1x / week   PT Duration Other (comment)  9 weeks (60 days)   PT Treatment/Interventions ADLs/Self Care Home Management;DME Instruction;Gait training;Stair training;Functional mobility training;Therapeutic activities;Therapeutic exercise;Balance training;Neuromuscular re-education;Patient/family education;Prosthetic Training   PT Next Visit Plan Discharge PT    Consulted and Agree with Plan of Care Patient      Patient will benefit from skilled therapeutic  intervention in order to improve the following deficits and impairments:  Abnormal gait, Decreased activity tolerance, Decreased balance, Decreased endurance, Decreased knowledge of use of DME, Decreased range  of motion, Decreased strength, Postural dysfunction, Prosthetic Dependency, Decreased mobility  Visit Diagnosis: Other abnormalities of gait and mobility  Unsteadiness on feet  Muscle weakness (generalized)       G-Codes - 07-Oct-2016 Oct 07, 2140    Functional Assessment Tool Used (Outpatient Only) Berg Balance 47/56 and Timed Up-Go with cane 32.99sec. Gait velocity with RW 1.35f/sec comfortable, 1.82fsec fast with RW and 1.0632fec with cane.    Functional Limitation Mobility: Walking and moving around   Mobility: Walking and Moving Around Goal Status ([id number] least 20 percent but less than 40 percent impaired, limited or restricted   Mobility: Walking and Moving Around Discharge Status ([id number] least 20 percent but less than 40 percent impaired, limited or restricted      Problem List Patient Active Problem List   Diagnosis Date Noted  . Chronic systolic HF (heart failure) ([place]/2017  . ACS (acute coronary syndrome) ([facility]/04/2016  . NSTEMI (non-ST elevated myocardial infarction) ([place] [place]/04/2016  . Brain aneurysm 04/13/2016  . Hypertension   . Dyslipidemia   . CAD in native artery   . Chest pain 01/29/2016  . PAC (premature atrial contraction) 08/14/2014  . PVC's (premature ventricular contractions) 08/14/2014  . Palpitations 11/02/2013  . Hypercholesteremia   . Essential hypertension, benign 11/01/2013  . Peripheral vascular disease, unspecified 11/01/2013    PHYSICAL THERAPY DISCHARGE SUMMARY  Visits from Start of Care: 22  Current functional level related to goals / functional outcomes: See above   Remaining deficits: See above   Education / Equipment: HEP  Plan: Patient agrees to discharge.  Patient goals were partially met. Patient is being discharged due to meeting the stated rehab goals.  ?????         , PT, DPT 3/82018/03/28:44 PM  [facility]one: [phone]:  [phone]: ShiBRIONNE MERTZN: [id number] of Birth: [birth year]

## 2016-09-28 ENCOUNTER — Ambulatory Visit (INDEPENDENT_AMBULATORY_CARE_PROVIDER_SITE_OTHER): Payer: Medicare Other | Admitting: Cardiology

## 2016-09-28 ENCOUNTER — Encounter: Payer: Self-pay | Admitting: Cardiology

## 2016-09-28 VITALS — BP 140/78 | HR 74 | Ht 62.0 in | Wt 138.1 lb

## 2016-09-28 DIAGNOSIS — I10 Essential (primary) hypertension: Secondary | ICD-10-CM | POA: Diagnosis not present

## 2016-09-28 DIAGNOSIS — I2510 Atherosclerotic heart disease of native coronary artery without angina pectoris: Secondary | ICD-10-CM

## 2016-09-28 DIAGNOSIS — I5181 Takotsubo syndrome: Secondary | ICD-10-CM | POA: Diagnosis not present

## 2016-09-28 DIAGNOSIS — E785 Hyperlipidemia, unspecified: Secondary | ICD-10-CM | POA: Diagnosis not present

## 2016-09-28 DIAGNOSIS — I493 Ventricular premature depolarization: Secondary | ICD-10-CM | POA: Diagnosis not present

## 2016-09-28 DIAGNOSIS — I119 Hypertensive heart disease without heart failure: Secondary | ICD-10-CM | POA: Diagnosis not present

## 2016-09-28 MED ORDER — METOPROLOL SUCCINATE ER 50 MG PO TB24: 50.0000 mg | ORAL_TABLET | Freq: Every day | ORAL | 3 refills | Status: DC

## 2016-09-28 NOTE — Progress Notes (Signed)
Cardiology Office Note    Date:  09/28/2016   ID:  COCO SHARPNACK, DOB 05/11/1941, MRN [id number]  PCP:  [name], MD  Cardiologist:  Armanda Magic, MD   Chief Complaint  Patient presents with  . Coronary Artery Disease  . Hypertension  . Hyperlipidemia  . Cardiomyopathy    History of Present Illness:  [name] is a 76 y.o. female with a history of recent NSTEMI with cath showing  75% DX1 lesion and moderate LVD 35-45% out of proportion to CAD and in a pattern of Takotsubo CM.  She was also noted to have a 55 mmHg LVOT gradient.  She was discharged home in stable condition on an ARB and BB. Repeat echo showed normalization of her LVF with EF 60-65% with normal wall motion, G1DD moderate BSH with narrowed LVOT and mild SAM with LVOT gradient of felt to represent a HOCM variant.  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 Venetia Maxon) who presents for follow up.  She is doing well today.  She has not had any further CP, PND or orthopnea. She denies any LE edema, dizziness, palpitations or syncope. She does have some SOB when she lays down having feeling that she cant get a deep breath in but if she calms down it goes away. She also gets SOB when she is exerting herself.      Past Medical History:  Diagnosis Date  . CAD in native artery     NSTEMI with cath showing  75% DX1 lesion and moderate LVD 35-45% out of proportion to CAD and in a pattern of Takotsubo CM.  [name] Dyslipidemia   . GERD (gastroesophageal reflux disease)   . Hypercholesteremia   . Hypertension   . Hypertensive heart disease    initially she was thought to have a HOCM variant by cath with increased LVOT gradient but echo showed only a gradient across the LVOT and SAM and cardiac MRI did not show any late gad enhancement and only a LVOT gradient with 15mm  BSH.  There was some mild SAM and mild MR.  EF normal at 76%.    . NSTEMI (non-ST elevated myocardial infarction) (HCC) 04/2016   secondary to stress CM - Takotsubo CM  . PAC (premature atrial contraction)    Event monitors 10/2013 and 08/2014 showed NSR, ST, PACs, PVCs.   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 Posterior communicating artery aneurysm    followed by Dr Venetia Maxon  . PVC's (premature ventricular contractions)    Event monitors 10/2013 and 08/2014 showed NSR, ST, PACs, PVCs.   . Takotsubo cardiomyopathy    EF normalized on repeat echo    Past Surgical History:  Procedure Laterality Date  . ABDOMINAL HYSTERECTOMY    . 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 CARDIAC CATHETERIZATION N/A 04/21/2016   Procedure: Left Heart Cath and Coronary Angiography;  Surgeon: Corky Crafts, MD;  Location: [facility] INVASIVE CV LAB;  Service: Cardiovascular;  Laterality: N/A;  . IR GENERIC HISTORICAL  02/13/2016   IR RADIOLOGY PERIPHERAL GUIDED IV START 02/13/2016 Berdine Dance, MD [facility]INTER[facility] RAD  . IR GENERIC HISTORICAL  02/13/2016   IR US GUIDE VASC ACCESS LEFT 02/13/2016 Berdine Dance, MD [facility]  Current Medications: Current Meds  Medication Sig  . acetaminophen (TYLENOL) 325 MG tablet Take 2 tablets (650 mg total) by mouth every 4 (four) hours as needed for headache or mild pain.  . Ascorbic Acid (VITAMIN C) 100 MG tablet Take 100 mg by mouth daily.  [name] aspirin EC 81 MG EC tablet Take 1 tablet (81 mg total) by mouth daily.  [name] atorvastatin (LIPITOR) 80 MG tablet Take 80 mg by mouth daily.  . calcium carbonate (OS-CAL) 600 MG TABS tablet Take 600 mg by mouth daily with breakfast.  . furosemide (LASIX) 20 MG tablet TAKE 1 TABLET (20 MG TOTAL) BY MOUTH DAILY AS NEEDED FOR FLUID OR EDEMA.  [name] gabapentin (NEURONTIN) 300 MG capsule Take 300 mg by mouth 3 (three) times daily.  . hydrochlorothiazide (HYDRODIURIL)  12.5 MG tablet Take 12.5 mg by mouth every morning.  . isosorbide mononitrate (IMDUR) 30 MG 24 hr tablet Take 0.5 tablets (15 mg total) by mouth daily.  [name] losartan (COZAAR) 100 MG tablet Take 1 tablet (100 mg total) by mouth daily.  . meloxicam (MOBIC) 7.5 MG tablet Take 7.5 mg by mouth as needed for pain (once or twice a day as needed for shoulder/neck pain).  . metoprolol succinate (TOPROL-XL) 25 MG 24 hr tablet Take 1 tablet (25 mg total) by mouth daily.  . Multiple Vitamin (MULTIVITAMIN) tablet Take 1 tablet by mouth daily.  . Omega-3 Fatty Acids (FISH OIL PO) Take 1 capsule by mouth daily.   . pantoprazole (PROTONIX) 40 MG tablet Take 1 tablet (40 mg total) by mouth daily.  [name] QVAR 80 MCG/ACT inhaler Inhale 1 puff into the lungs as needed. For shortness of breath.  [name] SALINE NASAL SPRAY NA Place into the nose. 2 sprays per nostril every 3-4 hours for upper respatory maintenance  . vitamin B-12 (CYANOCOBALAMIN) 1000 MCG tablet Take 1,000 mcg by mouth daily.    Allergies:   Codeine and Penicillins   Social History   Social History  . Marital status: Single    Spouse name: N/A  . Number of children: N/A  . Years of education: N/A   Social History Main Topics  . Smoking status: Former Smoker    Quit date: 07/13/2004  . Smokeless tobacco: Never Used  . Alcohol use Yes     Comment: ocassionally   . Drug use: No  . Sexual activity: Not Asked   Other Topics Concern  . None   Social History Narrative  . None     Family History:  The patient's family history includes Heart attack in her brother, mother, and sister; Heart disease in her brother, mother, and sister.   ROS:   Please see the history of present illness.    ROS All other systems reviewed and are negative.  No flowsheet data found.     PHYSICAL EXAM:   VS:  BP 140/78   Pulse 74   Ht 5\' 2"  (1.575 m)   Wt 138 lb 1.9 oz (62.7 kg)   SpO2 93%   BMI 25.26 kg/m    GEN: Well nourished, well developed, in no acute  distress  HEENT: normal  Neck: no JVD, carotid bruits, or masses Cardiac: RRR; no murmurs, rubs, or gallops,no edema.  Intact distal pulses bilaterally.  Respiratory:  clear to auscultation bilaterally, normal work of breathing GI: soft, nontender, nondistended, + BS MS: no deformity or atrophy  Skin: warm and dry, no rash Neuro:  Alert and Oriented x 3, Strength and sensation  are intact Psych: euthymic mood, full affect  Wt Readings from Last 3 Encounters:  09/28/16 138 lb 1.9 oz (62.7 kg)  06/29/16 138 lb (62.6 kg)  05/12/16 134 lb 3.2 oz (60.9 kg)      Studies/Labs Reviewed:   EKG:  EKG is not ordered today.    Recent Labs: 04/21/2016: ALT 15; Magnesium 2.0 04/23/2016: Hemoglobin 11.1; Platelets 142 05/12/2016: BUN 15; Potassium 4.0; Sodium 141 07/27/2016: Creatinine, Ser 0.83   Lipid Panel    Component Value Date/Time   CHOL 176 04/22/2016 0017   TRIG 260 (H) 04/22/2016 0017   HDL 45 04/22/2016 0017   CHOLHDL 3.9 04/22/2016 0017   VLDL 52 (H) 04/22/2016 0017   LDLCALC 79 04/22/2016 0017    Additional studies/ records that were reviewed today include:  Cath report and echo    ASSESSMENT:    1. CAD in native artery   2. Takotsubo cardiomyopathy   3. Essential hypertension, benign   4. PVC's (premature ventricular contractions)   5. Dyslipidemia   6. Hypertensive heart disease without heart failure      PLAN:  In order of problems listed above:  1. ASCAD - s/p  NSTEMI with cath showing  75% DX1 lesion and moderate LVD 35-45% out of proportion to CAD and in a pattern of Takotsubo CM.  She is on medical management for her diagonal disease.  She will continue on ASA/high dose statin and BB.  She continues to have some DOE which I suspect is more due to HTN but could be due to small vessel disease.  I will increase her metoprolol to 50mg  BID and have her followup with the PA in 2 weeks.     2. Takotsubo DCM - EF normalized on repeat echo11/2017.  She will  continue on ARB and BB.    3.   HTN - BP borderline controlled on current meds.  She will continue on ARB/BB and diuretic.   Her last creatinine was 0.83 in January 2018.  4.   PVCs - controlled on BB.  5.   Dyslipidemia with LDL goal < 70.  She will continue on high dose statin. I will repeat FLP and ALT.   6.   BSH - initially she was thought to have a HOCM variant by cath with increased LVOT gradient but echo showed only a 10mmHg gradient across the LVOT and SAM and cardiac MRI did not show any late gad enhancement and only a 5mmHg LVOT gradient with 15mm BSH.  There was some mild SAM and mild MR.  EF normal at 76%.      Medication Adjustments/Labs and Tests Ordered: Current medicines are reviewed at length with the patient today.  Concerns regarding medicines are outlined above.  Medication changes, Labs and Tests ordered today are listed in the Patient Instructions below.  There are no Patient Instructions on file for this visit.   Signed, Armanda Magicraci Turner, MD  09/28/2016 3:55 PM    [facility] [address], [place], [place]  [address] Phone: [phone]; Fax: [phone]

## 2016-09-28 NOTE — Patient Instructions (Signed)
Medication Instructions:  1) INCREASE TOPROL to 50 mg daily  Labwork: Your physician recommends that you return for FASTING lab work.  Testing/Procedures: None  Follow-Up: Your physician recommends that you schedule a follow-up appointment in 3 WEEKS with Dr. Norris Crossurner's assistant.  Your physician wants you to follow-up in: 6 months with Dr. Mayford Knifeurner. You will receive a reminder letter in the mail two months in advance. If you don't receive a letter, please call our office to schedule the follow-up appointment.   Any Other Special Instructions Will Be Listed Below (If Applicable).     If you need a refill on your cardiac medications before your next appointment, please call your pharmacy.

## 2016-10-12 ENCOUNTER — Encounter: Payer: Self-pay | Admitting: Cardiology

## 2016-10-16 ENCOUNTER — Ambulatory Visit
Admission: RE | Admit: 2016-10-16 | Discharge: 2016-10-16 | Disposition: A | Discharge status: Home / Self Care | Payer: Medicare Other | Source: Ambulatory Visit | Attending: Family Medicine | Admitting: Family Medicine

## 2016-10-16 DIAGNOSIS — Z1231 Encounter for screening mammogram for malignant neoplasm of breast: Secondary | ICD-10-CM

## 2016-10-27 ENCOUNTER — Ambulatory Visit: Payer: Medicare Other | Admitting: Cardiology

## 2016-10-28 ENCOUNTER — Encounter: Payer: Self-pay | Admitting: Cardiology

## 2016-11-23 ENCOUNTER — Other Ambulatory Visit: Payer: Self-pay | Admitting: *Deleted

## 2016-11-23 MED ORDER — FUROSEMIDE 20 MG PO TABS: 20.0000 mg | ORAL_TABLET | Freq: Every day | ORAL | 2 refills | Status: DC | PRN

## 2016-12-08 ENCOUNTER — Telehealth: Payer: Self-pay | Admitting: Cardiology

## 2016-12-08 NOTE — Telephone Encounter (Signed)
pt confused about imdur dosage, i explained she is to take her Metoprolol 50 mg 1 T PO QD & Imdur is 30 mg 1/2 T PO QD.    She expressed understanding & will have to contact the pharmacy for a insurance override as she was taking it incorrect.

## 2016-12-08 NOTE — Telephone Encounter (Signed)
New message        *STAT* If patient is at the pharmacy, call can be transferred to refill team.   1. Which medications need to be refilled? (please list name of each medication and dose if known)  Isosorbide 30mg ----1 tab daily 2. Which pharmacy/location (including street and city if local pharmacy) is medication to be sent to? [facility] [address] [facility]. Do they need a 30 day or 90 day supply?  30 mg------PT IS OUT OF MEDICATION  For nurse: Pt is requesting a nurse call her and let her know what will happen if she does not have medication for a day or two.  Although it may get called in today, she is not sure she can get the medication.  Pt does not drive.

## 2017-03-08 ENCOUNTER — Other Ambulatory Visit: Payer: Self-pay | Admitting: Cardiology

## 2017-04-12 ENCOUNTER — Other Ambulatory Visit: Payer: Self-pay | Admitting: Cardiology

## 2017-04-12 NOTE — Telephone Encounter (Signed)
REFILL 

## 2017-04-20 ENCOUNTER — Other Ambulatory Visit: Payer: Self-pay

## 2017-05-17 ENCOUNTER — Inpatient Hospital Stay: Admit: 2017-08-02 | Payer: MEDICARE | Primary: Internal Medicine

## 2017-05-17 ENCOUNTER — Ambulatory Visit: Admit: 2017-05-17 | Discharge: 2017-05-17 | Payer: MEDICARE | Attending: Internal Medicine | Primary: Internal Medicine

## 2017-05-17 DIAGNOSIS — Z0000 Encounter for general adult medical examination without abnormal findings: Secondary | ICD-10-CM

## 2017-05-17 DIAGNOSIS — R0989 Other specified symptoms and signs involving the circulatory and respiratory systems: Secondary | ICD-10-CM | POA: Insufficient documentation

## 2017-05-17 DIAGNOSIS — K219 Gastro-esophageal reflux disease without esophagitis: Secondary | ICD-10-CM | POA: Insufficient documentation

## 2017-05-17 DIAGNOSIS — R490 Dysphonia: Secondary | ICD-10-CM | POA: Insufficient documentation

## 2017-05-17 DIAGNOSIS — E782 Mixed hyperlipidemia: Secondary | ICD-10-CM

## 2017-05-17 MED ORDER — TELMISARTAN-HYDROCHLOROTHIAZIDE 80 MG-25 MG TAB
80-25 mg | ORAL_TABLET | ORAL | 2 refills | Status: DC
Start: 2017-05-17 — End: 2018-05-20

## 2017-05-17 MED ORDER — SHINGRIX (PF) 50 MCG/0.5 ML INTRAMUSCULAR SUSPENSION, KIT
50 mcg/0.5 mL | Freq: Once | INTRAMUSCULAR | 1 refills | Status: AC
Start: 2017-05-17 — End: 2017-05-17

## 2017-05-17 NOTE — Patient Instructions (Addendum)
Body Mass Index: Care Instructions  Your Care Instructions    Body mass index (BMI) can help you see if your weight is raising your risk for health problems. It uses a formula to compare how much you weigh with how tall you are.  ?? A BMI lower than 18.5 is considered underweight.  ?? A BMI between 18.5 and 24.9 is considered healthy.  ?? A BMI between 25 and 29.9 is considered overweight. A BMI of 30 or higher is considered obese.  If your BMI is in the normal range, it means that you have a lower risk for weight-related health problems. If your BMI is in the overweight or obese range, you may be at increased risk for weight-related health problems, such as high blood pressure, heart disease, stroke, arthritis or joint pain, and diabetes. If your BMI is in the underweight range, you may be at increased risk for health problems such as fatigue, lower protection (immunity) against illness, muscle loss, bone loss, hair loss, and hormone problems.  BMI is just one measure of your risk for weight-related health problems. You may be at higher risk for health problems if you are not active, you eat an unhealthy diet, or you drink too much alcohol or use tobacco products.  Follow-up care is a key part of your treatment and safety. Be sure to make and go to all appointments, and call your doctor if you are having problems. It's also a good idea to know your test results and keep a list of the medicines you take.  How can you care for yourself at home?  ?? Practice healthy eating habits. This includes eating plenty of fruits, vegetables, whole grains, lean protein, and low-fat dairy.  ?? If your doctor recommends it, get more exercise. Walking is a good choice. Bit by bit, increase the amount you walk every day. Try for at least 30 minutes on most days of the week.  ?? Do not smoke. Smoking can increase your risk for health problems. If you need help quitting, talk to your doctor about stop-smoking programs and  medicines. These can increase your chances of quitting for good.  ?? Limit alcohol to 2 drinks a day for men and 1 drink a day for women. Too much alcohol can cause health problems.  If you have a BMI higher than 25  ?? Your doctor may do other tests to check your risk for weight-related health problems. This may include measuring the distance around your waist. A waist measurement of more than 40 inches in men or 35 inches in women can increase the risk of weight-related health problems.  ?? Talk with your doctor about steps you can take to stay healthy or improve your health. You may need to make lifestyle changes to lose weight and stay healthy, such as changing your diet and getting regular exercise.  If you have a BMI lower than 18.5  ?? Your doctor may do other tests to check your risk for health problems.  ?? Talk with your doctor about steps you can take to stay healthy or improve your health. You may need to make lifestyle changes to gain or maintain weight and stay healthy, such as getting more healthy foods in your diet and doing exercises to build muscle.  Where can you learn more?  Go to http://www.healthwise.net/[employer].  Enter S176 in the search box to learn more about "Body Mass Index: Care Instructions."  Current as of: April 25, 2015  Content Version: 11.4  ??   2006-2017 Healthwise, Incorporated. Care instructions adapted under license by [employer] (which disclaims liability or warranty for this information). If you have questions about a medical condition or this instruction, always ask your healthcare professional. [employer] any warranty or liability for your use of this information.    Medicare Wellness Visit, Female     The best way to live healthy is to have a lifestyle where you eat a well-balanced diet, exercise regularly, limit alcohol use, and quit all forms of tobacco/nicotine, if applicable.    Regular preventive services are another way to keep healthy. Preventive services (vaccines, screening tests, monitoring & exams) can help personalize your care plan, which helps you manage your own care. Screening tests can find health problems at the earliest stages, when they are easiest to treat.   [facility] follows the current, evidence-based guidelines published by the [place] [employer] (USPSTF) when recommending preventive services for our patients. Because we follow these guidelines, sometimes recommendations change over time as research supports it. (For example, mammograms used to be recommended annually. Even though Medicare will still pay for an annual mammogram, the newer guidelines recommend a mammogram every two years for women of average risk.)  Of course, you and your doctor may decide to screen more often for some diseases, based on your risk and your health status.   Preventive services for you include:  - Medicare offers their members a free annual wellness visit, which is time for you and your primary care provider to discuss and plan for your preventive service needs. Take advantage of this benefit every year!  -All adults over the age of 70 should receive the recommended pneumonia vaccines. Current USPSTF guidelines recommend a series of two vaccines for the best pneumonia protection.   -All adults should have a flu vaccine yearly and a tetanus vaccine every 10 years. All adults age 74 and older should receive a shingles vaccine once in their lifetime.    -A bone mass density test is recommended when a woman turns 65 to screen for osteoporosis. This test is only recommended one time, as a screening. Some providers will use this same test as a disease monitoring tool if you already have osteoporosis.  -All adults age 68-70 who are overweight should have a diabetes screening test once every three years.    -Other screening tests and preventive services for persons with diabetes include: an eye exam to screen for diabetic retinopathy, a kidney function test, a foot exam, and stricter control over your cholesterol.   -Cardiovascular screening for adults with routine risk involves an electrocardiogram (ECG) at intervals determined by your doctor.   -Colorectal cancer screenings should be done for adults age 13-75 with no increased risk factors for colorectal cancer.  There are a number of acceptable methods of screening for this type of cancer. Each test has its own benefits and drawbacks. Discuss with your doctor what is most appropriate for you during your annual wellness visit. The different tests include: colonoscopy (considered the best screening method), a fecal occult blood test, a fecal DNA test, and sigmoidoscopy.  -Breast cancer screenings are recommended every other year for women of normal risk, age 49-74.  -Cervical cancer screenings for women over age 43 are only recommended with certain risk factors.   -All adults born between [place] should be screened once for Hepatitis C.     Here is a list of your current Health Maintenance items (your personalized  list of preventive services) with a due date:  Health Maintenance Due   Topic Date Due   ??? Shingles Vaccine (1 of 2) 12/27/1988   ??? Glaucoma Screening   07/13/2016   ??? Annual Well Visit  01/09/2017

## 2017-05-17 NOTE — Progress Notes (Signed)
This is the Subsequent Medicare Annual Wellness Exam, performed 12 months or more after the Initial AWV or the last Subsequent AWV    I have reviewed the patient's medical history in detail and updated the computerized patient record.      Reports a lot of stress caring for her brother w DM and foot wounds and edema. She is taking him to his appts.      Reports some leg cramps at night.  Tried a little pickle juice.      Lost weight.   Trying to cut back sugars  Wt Readings from Last 3 Encounters:   05/17/17 193 lb 6.4 oz (87.7 kg)   01/09/16 207 lb (93.9 kg)   06/21/15 198 lb 9.6 oz (90.1 kg)     Hypertension  Hypertension ROS: taking medications as instructed, no medication side effects noted, no TIA's, no chest pain on exertion, no dyspnea on exertion, no swelling of ankles     reports that  has never smoked. she has never used smokeless tobacco.    reports that she does not drink alcohol.   BP Readings from Last 2 Encounters:   05/17/17 134/78   01/09/16 138/74         History     Past Medical History:   Diagnosis Date   ??? Essential hypertension    ??? GERD (gastroesophageal reflux disease)    ??? Hematuria 01/2010    Dr. York Cerise, due to catheter, blood clot. [place] nl 2012   ??? 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  Dr. Stacie Acres   ??? Mixed hyperlipidemia    ??? Osteopenia 01/2016   ??? Spider varicose veins    ??? Vitamin D deficiency 02/2012      Past Surgical History:   Procedure Laterality Date   ??? COLONOSCOPY  01/2010    normal   ??? HX KNEE ARTHROSCOPY      x4   ??? HX KNEE REPLACEMENT      left knee replacement, Dr. Stacie Acres     Current Outpatient Medications   Medication Sig Dispense Refill   ??? telmisartan-hydroCHLOROthiazide (MICARDIS HCT) 80-25 mg per tablet TAKE 1 TABLET BY MOUTH DAILY 90 Tab 5   ??? naproxen sodium (ALEVE) 220 mg cap Take 1 Tab by mouth daily.     ??? Cholecalciferol, Vitamin D3, (VITAMIN D3) 2,000 unit cap capsule Take 2,000 Units by mouth daily. 30 Cap 5    ??? multivit-min-iron-FA-lutein (CENTRUM SILVER WOMEN) 8 mg iron-400 mcg-300 mcg tab Take 1 daily 30 Tab 11     Allergies   Allergen Reactions   ??? Codeine Nausea Only   ??? Lisinopril Other (comments)     Facial flushing   ??? Tylenol-Codeine #2 Nausea and Vomiting     Family History   Problem Relation Age of Onset   ??? Hypertension Mother    ??? Kidney Disease Mother    ??? Heart Disease Mother         afib   ??? Hypertension Brother    ??? Heart Disease Brother    ??? Lung Disease Father         emphysema   ??? Lung Disease Brother    ??? Diabetes Neg Hx    ??? Cancer Neg Hx      Social History     Tobacco Use   ??? Smoking status: Never Smoker   ??? Smokeless tobacco: Never Used   Substance Use Topics   ??? Alcohol use: No  Patient Active Problem List   Diagnosis Code   ??? Knee pain M25.569   ??? Mixed hyperlipidemia E78.2   ??? HTN (hypertension) I10   ??? Vitamin D deficiency E55.9   ??? Spider varicose veins I86.8   ??? Osteopenia M85.80       Depression Risk Factor Screening:     PHQ over the last two weeks 05/17/2017   PHQ Not Done -   Little interest or pleasure in doing things Not at all   Feeling down, depressed, irritable, or hopeless Not at all   Total Score PHQ 2 0     Alcohol Risk Factor Screening:   You do not drink alcohol or very rarely.    Functional Ability and Level of Safety:   Hearing Loss  Hearing is good.    Activities of Daily Living  The home contains: no safety equipment.  Patient does total self care    Fall Risk  Fall Risk Assessment, last 12 mths 05/17/2017   Able to walk? Yes   Fall in past 12 months? No       Abuse Screen  Patient is not abused    Cognitive Screening   Evaluation of Cognitive Function:  Has your family/caregiver stated any concerns about your memory: no  Normal    Patient Care Team   Patient Care Team:  [place], Balinda Quails, MD as PCP - General (Internal Medicine)  Eustaquio Boyden, MD (Urology)    Assessment/Plan   Education and counseling provided:  Are appropriate based on today's review and evaluation     Diagnoses and all orders for this visit:    1. Medicare annual wellness visit, subsequent    2. Screening for depression  -     DEPRESSION SCREEN ANNUAL    3. Essential hypertension  Controlled on current regimen.  Continue current medications as written in chart  -     CBC W/O DIFF  -     METABOLIC PANEL, COMPREHENSIVE  -     telmisartan-hydroCHLOROthiazide (MICARDIS HCT) 80-25 mg per tablet; TAKE 1 TABLET BY MOUTH DAILY    4. Leg cramping  Mild to moderate symptoms.  Can use pickle juice since her blood pressure is controlled.  Use sparingly.  -     METABOLIC PANEL, COMPREHENSIVE    5. Mixed hyperlipidemia  Mild, borderline controlled  The patient is asked to make an attempt to improve diet and exercise patterns to aid in medical management of this problem.  -     LIPID PANEL  -     METABOLIC PANEL, COMPREHENSIVE    6. Vitamin D deficiency  She is taking a vitamin D supplement.  -     VITAMIN D, 25 HYDROXY    Other orders  -     SHINGRIX, PF, 50 mcg/0.5 mL susr injection; 0.5 mL by IntraMUSCular route once for 1 dose. Receive 2nd dose in 2-6 months.  For Shingles (Zoster) prevention    Discussed the patient's BMI with her.  The BMI follow up plan is as follows:     dietary management education, guidance, and counseling  encourage exercise  monitor weight  prescribed dietary intake    An After Visit Summary was printed and given to the patient.    Health Maintenance Due   Topic Date Due   ??? Shingrix Vaccine Age 81> (1 of 2) 12/27/1988   ??? GLAUCOMA SCREENING Q2Y  07/13/2016   ??? MEDICARE YEARLY EXAM  01/09/2017

## 2017-05-17 NOTE — ACP (Advance Care Planning) (Signed)
Advance Care Planning (ACP) Provider Note - Comprehensive     Date of ACP Conversation: 05/17/17  Persons included in Conversation:  patient  Length of ACP Conversation in minutes:  <16 minutes (Non-Billable)    Authorized Media planner (if patient is incapable of making informed decisions):   This person is:  Radiation protection practitioner ACP for ALL Patients with Decision Making Capacity:   Importance of advance care planning, including choosing a healthcare agent to communicate patient's healthcare decisions if patient lost the ability to make decisions, such as after a sudden illness or accident  Understanding of the healthcare agent role was assessed and information provided  Exploration of values, goals, and preferences if recovery is not expected, even with continued medical treatment in the event of: Imminent death  Severe, permanent brain injury    Review of Existing Advance Directive:  not availble     For Serious or Chronic Illness:  Understanding of medical condition    Understanding of CPR, goals and expected outcomes, benefits and burdens discussed.  Information on CPR success rates provided (e.g. for CPR in hospital, survival to d/c at two weeks is 22%, for chronically ill or elderly/frail survival is less than 3%); Individual asked to communicate understanding of information in his/her own words.  Explored fears and concerns regarding CPR or possible outcomes    Interventions Provided:  Recommended completion of Advance Directive form after review of ACP materials and conversation with prospective healthcare agent   Recommended communicating the plan and making copies for the healthcare agent, personal physician, and others as appropriate (e.g., health system)

## 2017-05-18 LAB — CBC W/O DIFF
HCT: 42.4 % (ref 34.0–46.6)
HGB: 13.9 g/dL (ref 11.1–15.9)
MCH: 28.7 pg (ref 26.6–33.0)
MCHC: 32.8 g/dL (ref 31.5–35.7)
MCV: 88 fL (ref 79–97)
PLATELET: 238 10*3/uL (ref 150–379)
RBC: 4.84 x10E6/uL (ref 3.77–5.28)
RDW: 14 % (ref 12.3–15.4)
WBC: 6.7 10*3/uL (ref 3.4–10.8)

## 2017-05-18 LAB — METABOLIC PANEL, COMPREHENSIVE
A-G Ratio: 1.6 (ref 1.2–2.2)
ALT (SGPT): 13 IU/L (ref 0–32)
AST (SGOT): 14 IU/L (ref 0–40)
Albumin: 4.1 g/dL (ref 3.5–4.8)
Alk. phosphatase: 90 IU/L (ref 39–117)
BUN/Creatinine ratio: 18 (ref 12–28)
BUN: 17 mg/dL (ref 8–27)
Bilirubin, total: 0.3 mg/dL (ref 0.0–1.2)
CO2: 27 mmol/L (ref 20–29)
Calcium: 9.3 mg/dL (ref 8.7–10.3)
Chloride: 100 mmol/L (ref 96–106)
Creatinine: 0.96 mg/dL (ref 0.57–1.00)
GFR est AA: 66 mL/min/{1.73_m2} (ref >59)
GFR est non-AA: 57 mL/min/{1.73_m2} — ABNORMAL LOW (ref >59)
GLOBULIN, TOTAL: 2.5 g/dL (ref 1.5–4.5)
Glucose: 102 mg/dL — ABNORMAL HIGH (ref 65–99)
Potassium: 4.3 mmol/L (ref 3.5–5.2)
Protein, total: 6.6 g/dL (ref 6.0–8.5)
Sodium: 141 mmol/L (ref 134–144)

## 2017-05-18 LAB — LIPID PANEL
Cholesterol, total: 241 mg/dL — ABNORMAL HIGH (ref 100–199)
HDL Cholesterol: 65 mg/dL (ref >39)
LDL, calculated: 150 mg/dL — ABNORMAL HIGH (ref 0–99)
Triglyceride: 129 mg/dL (ref 0–149)
VLDL, calculated: 26 mg/dL (ref 5–40)

## 2017-05-18 LAB — CVD REPORT

## 2017-05-18 LAB — CKD REPORT

## 2017-05-18 LAB — VITAMIN D, 25 HYDROXY: VITAMIN D, 25-HYDROXY: 18 ng/mL — ABNORMAL LOW (ref 30.0–100.0)

## 2017-08-26 ENCOUNTER — Other Ambulatory Visit: Payer: Self-pay | Admitting: Family Medicine

## 2017-08-26 DIAGNOSIS — I671 Cerebral aneurysm, nonruptured: Secondary | ICD-10-CM

## 2017-08-30 ENCOUNTER — Emergency Department (HOSPITAL_COMMUNITY): Payer: Medicare Other

## 2017-08-30 ENCOUNTER — Encounter (HOSPITAL_COMMUNITY): Payer: Self-pay | Admitting: Emergency Medicine

## 2017-08-30 ENCOUNTER — Emergency Department (HOSPITAL_COMMUNITY)
Admission: EM | Admit: 2017-08-30 | Discharge: 2017-08-30 | Disposition: A | Discharge status: Home / Self Care | Payer: Medicare Other | Attending: Emergency Medicine | Admitting: Emergency Medicine

## 2017-08-30 DIAGNOSIS — I119 Hypertensive heart disease without heart failure: Secondary | ICD-10-CM | POA: Diagnosis not present

## 2017-08-30 DIAGNOSIS — R079 Chest pain, unspecified: Secondary | ICD-10-CM

## 2017-08-30 DIAGNOSIS — I252 Old myocardial infarction: Secondary | ICD-10-CM | POA: Diagnosis not present

## 2017-08-30 DIAGNOSIS — K219 Gastro-esophageal reflux disease without esophagitis: Secondary | ICD-10-CM | POA: Insufficient documentation

## 2017-08-30 DIAGNOSIS — Z7982 Long term (current) use of aspirin: Secondary | ICD-10-CM | POA: Diagnosis not present

## 2017-08-30 DIAGNOSIS — I10 Essential (primary) hypertension: Secondary | ICD-10-CM | POA: Insufficient documentation

## 2017-08-30 DIAGNOSIS — Z79899 Other long term (current) drug therapy: Secondary | ICD-10-CM | POA: Insufficient documentation

## 2017-08-30 DIAGNOSIS — Z87891 Personal history of nicotine dependence: Secondary | ICD-10-CM | POA: Diagnosis not present

## 2017-08-30 DIAGNOSIS — I2510 Atherosclerotic heart disease of native coronary artery without angina pectoris: Secondary | ICD-10-CM | POA: Insufficient documentation

## 2017-08-30 DIAGNOSIS — R0789 Other chest pain: Secondary | ICD-10-CM | POA: Diagnosis present

## 2017-08-30 DIAGNOSIS — R110 Nausea: Secondary | ICD-10-CM | POA: Insufficient documentation

## 2017-08-30 LAB — I-STAT TROPONIN, ED
TROPONIN I, POC: 0.01 ng/mL (ref 0.00–0.08)
TROPONIN I, POC: 0 ng/mL (ref 0.00–0.08)

## 2017-08-30 LAB — BASIC METABOLIC PANEL
Anion gap: 11 (ref 5–15)
BUN: 14 mg/dL (ref 6–20)
CALCIUM: 10.1 mg/dL (ref 8.9–10.3)
CHLORIDE: 104 mmol/L (ref 101–111)
CO2: 24 mmol/L (ref 22–32)
Creatinine, Ser: 0.89 mg/dL (ref 0.44–1.00)
GFR calc non Af Amer: >60 mL/min (ref >60)
Glucose, Bld: 102 mg/dL — ABNORMAL HIGH (ref 65–99)
Potassium: 3.8 mmol/L (ref 3.5–5.1)
SODIUM: 139 mmol/L (ref 135–145)

## 2017-08-30 LAB — CBC
HCT: 41.5 % (ref 36.0–46.0)
HEMOGLOBIN: 13.4 g/dL (ref 12.0–15.0)
MCH: 29.7 pg (ref 26.0–34.0)
MCHC: 32.3 g/dL (ref 30.0–36.0)
MCV: 92 fL (ref 78.0–100.0)
Platelets: 152 10*3/uL (ref 150–400)
RBC: 4.51 MIL/uL (ref 3.87–5.11)
RDW: 14.5 % (ref 11.5–15.5)
WBC: 5.8 10*3/uL (ref 4.0–10.5)

## 2017-08-30 MED ORDER — SUCRALFATE 1 G PO TABS: 1.0000 g | ORAL_TABLET | Freq: Three times a day (TID) | ORAL | 0 refills | Status: DC

## 2017-08-30 NOTE — ED Provider Notes (Signed)
[facility] EMERGENCY DEPARTMENT Provider Note   CSN: [id number] Arrival date & time: 08/30/17  1314     History   Chief Complaint Chief Complaint  Patient presents with  . Chest Pain    HPI [name] is a 77 y.o. female.  HPI Pt started having chest pain yesterday evening.  It felt like a throbbing in her chest and then go to the stomach. She tried taking tums and maalox.  It would help temporarily but then come back.  She had some nausea.  No vomiting.  No cough.  Pt has history of heart diease but she does not remember how she felt with the heart attack.  Right now she is not having any sx. Past Medical History:  Diagnosis Date  . CAD in native artery     NSTEMI with cath showing  75% DX1 lesion and moderate LVD 35-45% out of proportion to CAD and in a pattern of Takotsubo CM.  [name] Dyslipidemia   . GERD (gastroesophageal reflux disease)   . Hypercholesteremia   . Hypertension   . Hypertensive heart disease    initially she was thought to have a HOCM variant by cath with increased LVOT gradient but echo showed only a gradient across the LVOT and SAM and cardiac MRI did not show any late gad enhancement and only a LVOT gradient with 15mm BSH.  There was some mild SAM and mild MR.  EF normal at 76%.    . NSTEMI (non-ST elevated myocardial infarction) (HCC) 04/2016   secondary to stress CM - Takotsubo CM  . PAC (premature atrial contraction)    Event monitors 10/2013 and 08/2014 showed NSR, ST, PACs, PVCs.   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 Posterior communicating artery aneurysm    followed by Dr Venetia Maxon  . PVC's (premature ventricular contractions)    Event monitors 10/2013 and 08/2014 showed NSR, ST, PACs, PVCs.   . Takotsubo cardiomyopathy    EF normalized on repeat echo    Patient Active Problem List   Diagnosis Date Noted  . Takotsubo cardiomyopathy   .  Hypertensive heart disease   . Brain aneurysm 04/13/2016  . Dyslipidemia   . CAD in native artery   . PAC (premature atrial contraction) 08/14/2014  . PVC's (premature ventricular contractions) 08/14/2014  . Palpitations 11/02/2013  . Essential hypertension, benign 11/01/2013  . Peripheral vascular disease, unspecified (HCC) 11/01/2013    Past Surgical History:  Procedure Laterality Date  . ABDOMINAL HYSTERECTOMY    . 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 CARDIAC CATHETERIZATION N/A 04/21/2016   Procedure: Left Heart Cath and Coronary Angiography;  Surgeon: Corky Crafts, MD;  Location[facility] INVASIVE CV LAB;  Service: Cardiovascular;  Laterality: N/A;  . IR GENERIC HISTORICAL  02/13/2016   IR RADIOLOGY PERIPHERAL GUIDED IV START 02/13/2016 Berdine Dance, MD [facility]  . IR GENERIC HISTORICAL  02/13/2016   IR US GUIDE VASC ACCESS LEFT 02/13/2016 Berdine Dance, MD [facility]    OB History    No data available       Home Medications    Prior to Admission medications   Medication Sig Start Date End Date Taking? Authorizing Provider  acetaminophen (TYLENOL) 325 MG tablet Take 2 tablets (650 mg total) by mouth every 4 (four) hours as needed for headache or mild pain. 04/23/16  Yes Abelino Derrick, PA-C  Ascorbic Acid (VITAMIN C) 100 MG tablet Take 100 mg by mouth daily at 2 PM.    Yes [provider]  aspirin EC 81 MG EC tablet Take 1 tablet (81 mg total) by mouth daily. Patient taking differently: Take 81 mg by mouth daily at 2 PM.  04/23/16  Yes Kilroy, Eda Paschal, PA-C  atorvastatin (LIPITOR) 40 MG tablet Take 40 mg by mouth at bedtime.   Yes [provider]  Calcium Carbonate-Vitamin D (CALCIUM-D PO) Take 1 tablet by mouth daily.   Yes [provider]  fluticasone (FLONASE) 50 MCG/ACT nasal spray Place 1 spray into both nostrils daily as needed (congestion).  05/10/17  Yes [provider]  fluticasone (FLOVENT HFA) 110 MCG/ACT  inhaler Inhale 1 puff into the lungs at bedtime. 05/10/17  Yes [provider]  furosemide (LASIX) 20 MG tablet TAKE 1 TABLET (20 MG TOTAL) BY MOUTH DAILY AS NEEDED FOR FLUID OR EDEMA. 04/12/17  Yes Kilroy, Luke K, PA-C  gabapentin (NEURONTIN) 300 MG capsule Take 300 mg by mouth 3 (three) times daily.   Yes [provider]  hydrochlorothiazide (HYDRODIURIL) 12.5 MG tablet Take 12.5 mg by mouth daily.  09/08/16  Yes [provider]  isosorbide mononitrate (IMDUR) 30 MG 24 hr tablet TAKE 0.5 TABLETS (15 MG TOTAL) BY MOUTH DAILY. 03/09/17  Yes Turner, Cornelious Bryant, MD  meloxicam (MOBIC) 7.5 MG tablet Take 7.5 mg by mouth daily as needed (back pain).    Yes [provider]  metoprolol succinate (TOPROL-XL) 50 MG 24 hr tablet Take 1 tablet (50 mg total) by mouth daily. 09/28/16  Yes Turner, Cornelious Bryant, MD  Multiple Vitamin (MULTIVITAMIN WITH MINERALS) TABS tablet Take 1 tablet by mouth daily.   Yes [provider]  Omega-3 Fatty Acids (FISH OIL PO) Take 1 capsule by mouth daily at 2 PM.    Yes [provider]  omeprazole (PRILOSEC) 40 MG capsule Take 40 mg by mouth every evening. 05/17/17  Yes [provider]  Polyethyl Glycol-Propyl Glycol (SYSTANE OP) Place 1 drop into both eyes at bedtime.   Yes [provider]  losartan (COZAAR) 100 MG tablet Take 1 tablet (100 mg total) by mouth daily. Patient not taking: Reported on 08/30/2017 05/12/16   Robbie Lis M, PA-C  pantoprazole (PROTONIX) 40 MG tablet Take 1 tablet (40 mg total) by mouth daily. Patient not taking: Reported on 08/30/2017 04/23/16   Abelino Derrick, PA-C  sucralfate (CARAFATE) 1 g tablet Take 1 tablet (1 g total) by mouth 4 (four) times daily -  with meals and at bedtime. 08/30/17   Linwood Dibbles, MD    Family History Family History  Problem Relation Age of Onset  . Heart attack Mother   . Heart disease Mother   . Heart attack Brother   . Heart disease Brother   . Heart  attack Sister   . Heart disease Sister     Social History Social History   Tobacco Use  . Smoking status: Former Smoker    Last attempt to quit: 07/13/2004    Years since quitting: 13.1  . Smokeless tobacco: Never Used  Substance Use Topics  . Alcohol use: Yes    Comment: ocassionally   . Drug use: No     Allergies   Codeine and Penicillins   Review of Systems Review of Systems   Physical Exam Updated Vital Signs BP (!) 193/59 (BP Location: Right Arm)  Pulse (!) 57   Temp 98.1 F (36.7 C) (Oral)   Resp 16   SpO2 99%   Physical Exam  Constitutional: She appears well-developed and well-nourished. No distress.  HENT:  Head: Normocephalic and atraumatic.  Right Ear: External ear normal.  Left Ear: External ear normal.  Eyes: Conjunctivae are normal. Right eye exhibits no discharge. Left eye exhibits no discharge. No scleral icterus.  Neck: Neck supple. No tracheal deviation present.  Cardiovascular: Normal rate, regular rhythm and intact distal pulses.  Pulmonary/Chest: Effort normal and breath sounds normal. No stridor. No respiratory distress. She has no wheezes. She has no rales.  Abdominal: Soft. Bowel sounds are normal. She exhibits no distension. There is no tenderness. There is no rebound and no guarding.  Musculoskeletal: She exhibits no edema or tenderness.  S/p amputation rle  Neurological: She is alert. She has normal strength. No cranial nerve deficit (no facial droop, extraocular movements intact, no slurred speech) or sensory deficit. She exhibits normal muscle tone. She displays no seizure activity. Coordination normal.  Skin: Skin is warm and dry. No rash noted.  Psychiatric: She has a normal mood and affect.  Nursing note and vitals reviewed.    ED Treatments / Results  Labs (all labs ordered are listed, but only abnormal results are displayed) Labs Reviewed  BASIC METABOLIC PANEL - Abnormal; Notable for the following components:      Result  Value   Glucose, Bld 102 (*)    All other components within normal limits  CBC  I-STAT TROPONIN, ED  I-STAT TROPONIN, ED    EKG  EKG Interpretation  Date/Time:  Monday August 30 2017 13:40:54 EST Ventricular Rate:  61 PR Interval:  170 QRS Duration: 68 QT Interval:  394 QTC Calculation: 396 R Axis:   22 Text Interpretation:  Normal sinus rhythm ST & T wave abnormality, consider lateral ischemia Abnormal ECG No significant change since last tracing Confirmed by Linwood Dibbles [phone]) on 08/30/2017 8:46:20 PM       Radiology Dg Chest 2 View  Result Date: 08/30/2017 CLINICAL DATA:  Chest pain EXAM: CHEST  2 VIEW COMPARISON:  04/21/2016 chest radiograph. FINDINGS: Stable cardiomediastinal silhouette with normal heart size. No pneumothorax. No pleural effusion. Stable mild eventration of the anterior right hemidiaphragm with associated mild chronic right middle lobe atelectasis. No pulmonary edema. No acute consolidative airspace disease. IMPRESSION: No acute cardiopulmonary disease. 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 Electronically Signed   By: Delbert Phenix M.D.   On: 08/30/2017 15:23    Procedures Procedures (including critical care time)  Medications Ordered in ED Medications - No data to display   Initial Impression / Assessment and Plan / ED Course  I have reviewed the triage vital signs and the nursing notes.  Pertinent labs & imaging results that were available during my care of the patient were reviewed by me and considered in my medical decision making (see chart for details).   Patient resented with complaints of chest pain.  She does have a history of coronary artery disease.  Symptoms are more suggestive of acid reflux.  Patient had symptoms for 2 days now and has had negative troponins x2.  Her EKG does not show any acute findings.  Patient is currently not having any chest pain.  I  think it is reasonable for her to follow-up with her cardiologist as an outpatient.  I will have her try adding Carafate to  her proton pump inhibitor.  Warning signs and precautions patient and her son.  They are agreeable to this plan.  Final Clinical Impressions(s) / ED Diagnoses   Final diagnoses:  Chest pain, unspecified type  Gastroesophageal reflux disease, esophagitis presence not specified    ED Discharge Orders        Ordered    sucralfate (CARAFATE) 1 g tablet  3 times daily with meals & bedtime     08/30/17 2221       Linwood DibblesKnapp, , MD 08/30/17 2222

## 2017-08-30 NOTE — ED Notes (Signed)
Pt discharged from ED; instructions provided and scripts given; Pt encouraged to return to ED if symptoms worsen and to f/u with PCP; Pt verbalized understanding of all instructions 

## 2017-08-30 NOTE — Discharge Instructions (Signed)
Follow-up with your cardiologist.  Return as needed for worsening symptoms.

## 2017-08-30 NOTE — ED Triage Notes (Signed)
Pt states new onset centralized chest pain, described as a burning sensation, radiates into her left shoulder blade that started this morning.

## 2017-08-31 ENCOUNTER — Other Ambulatory Visit: Payer: Self-pay | Admitting: Cardiology

## 2017-08-31 MED ORDER — ISOSORBIDE MONONITRATE ER 30 MG PO TB24: 15.0000 mg | ORAL_TABLET | Freq: Every day | ORAL | 0 refills | Status: DC

## 2017-09-02 ENCOUNTER — Other Ambulatory Visit: Payer: Self-pay

## 2017-09-04 ENCOUNTER — Ambulatory Visit
Admission: RE | Admit: 2017-09-04 | Discharge: 2017-09-04 | Disposition: A | Discharge status: Home / Self Care | Payer: Medicare Other | Source: Ambulatory Visit | Attending: Family Medicine | Admitting: Family Medicine

## 2017-09-04 ENCOUNTER — Other Ambulatory Visit: Payer: Self-pay | Admitting: Cardiology

## 2017-09-04 DIAGNOSIS — I671 Cerebral aneurysm, nonruptured: Secondary | ICD-10-CM

## 2017-09-13 IMAGING — MR MR CARD MORPHOLOGY WO/W CM
12 of 13 series · 39 of 40 positions shown · IV contrast (21    MH)
Comparison: none

CLINICAL DATA: Hypertrophic Cardiomyopathy

EXAM:
CARDIAC MRI
TECHNIQUE: The patient was scanned on a 1.5 Tesla GE magnet. A dedicated
cardiac coil was used. Functional imaging was done using Fiesta
sequences. [DATE], and 4 chamber views were done to assess for RWMA's.
Modified Batalon rule using a short axis stack was used to
calculate an ejection fraction on a dedicated work station using
Circle software. The patient received 21 cc of Multihance. After 10
minutes inversion recovery sequences were used to assess for
infiltration and scar tissue.
CONTRAST:  21 cc Multihance

[Series 4: bSSFP · sagittal · 8.0mm · 1.21mm/px · 1 of 14 slices shown (1 of 6)]
[im 1/14]
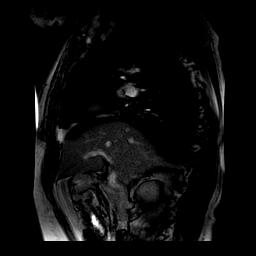

[Series 6: bSSFP · oblique · 8.0mm · 1.41mm/px · 1 of 7 slices shown (2 of 6)]
[im 1/7]
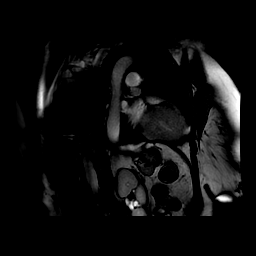

[Series 7: bSSFP · axial · 8.0mm · 1.37mm/px · 1 of 20 slices shown (3 of 6)]
[im 1/20]
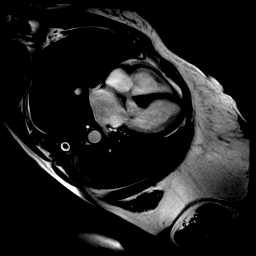

[Series 8: bSSFP · oblique · 8.0mm · 1.25mm/px · 14 of 280 slices shown (4 of 6)]
[im 1/280]
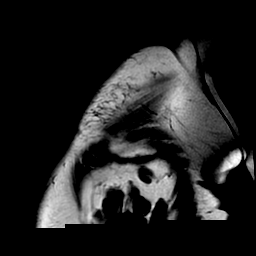
[im 22/280]
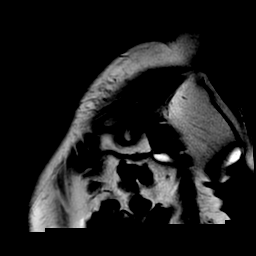
[im 43/280]
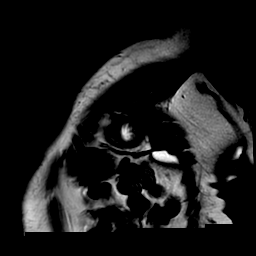
[im 65/280]
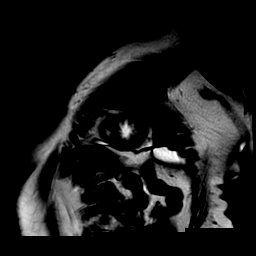
[im 86/280]
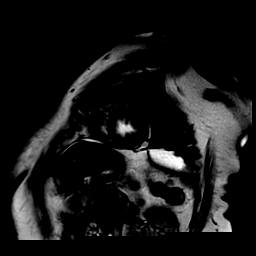
[im 108/280]
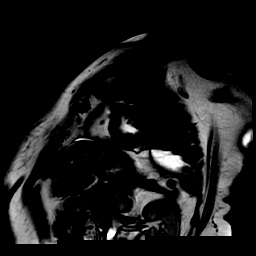
[im 129/280]
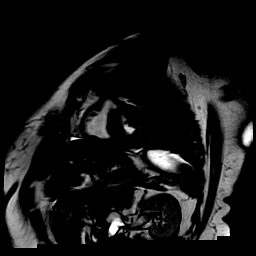
[im 151/280]
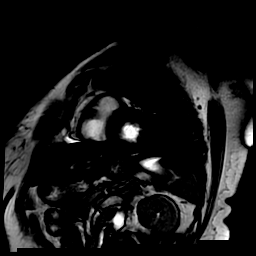
[im 172/280]
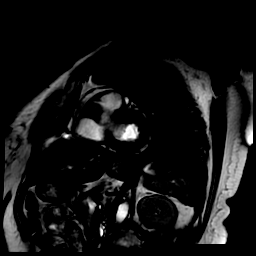
[im 194/280]
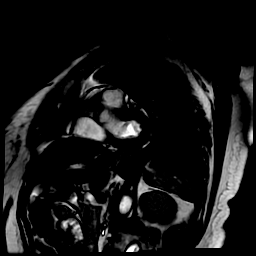
[im 215/280]
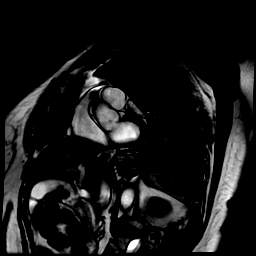
[im 237/280]
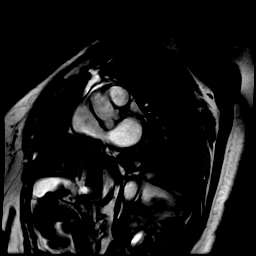
[im 258/280]
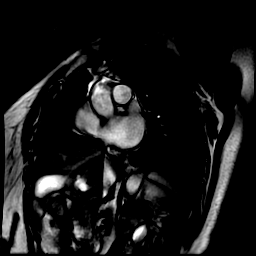
[im 280/280]
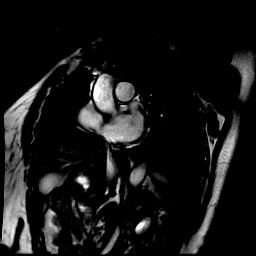

[Series 12: bSSFP · oblique · 8.0mm · 1.21mm/px · 5 of 100 slices shown (5 of 6)]
[im 1/100]
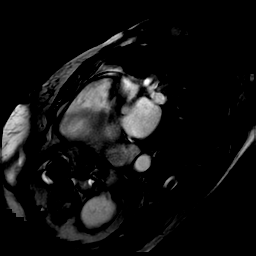
[im 25/100]
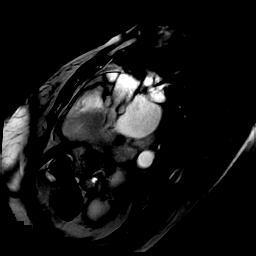
[im 50/100]
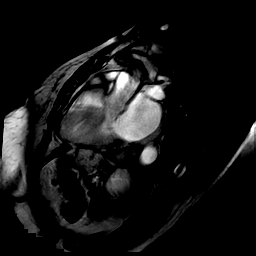
[im 75/100]
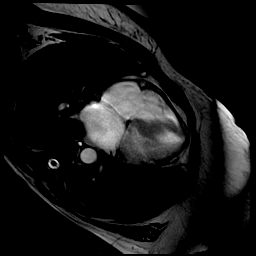
[im 100/100]
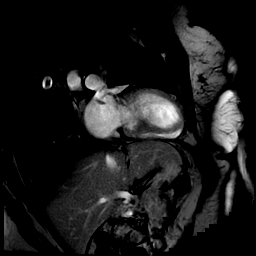

[Series 13: qpqs (id) · axial · 8.0mm · 1.48mm/px · z∈[+196,+196]mm · 3 of 60 slices shown]
[im 1/60]
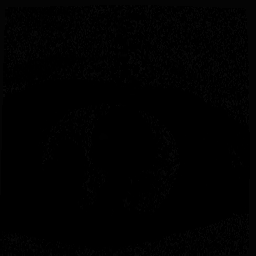
[im 30/60]
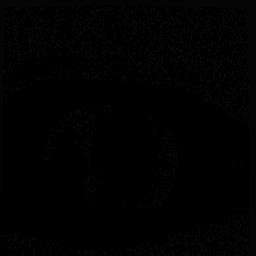
[im 60/60]
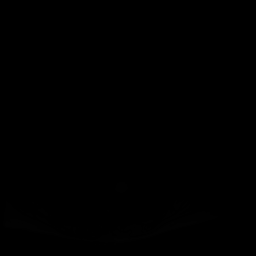

[Series 14: lvot below (id) · axial · 8.0mm · 1.48mm/px · z∈[-21,-21]mm · 3 of 60 slices shown]
[im 1/60]
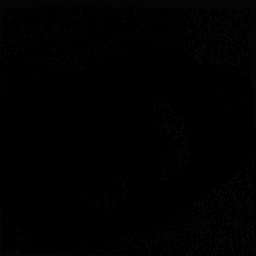
[im 30/60]
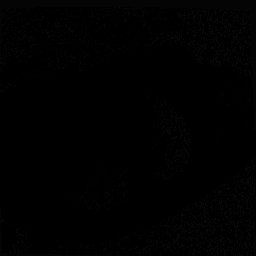
[im 60/60]
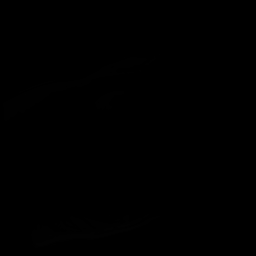

[Series 16: lvot above (id) · axial · 8.0mm · 1.48mm/px · z∈[+4,+4]mm · 3 of 60 slices shown]
[im 1/60]
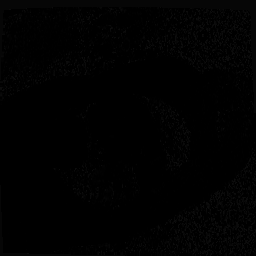
[im 30/60]
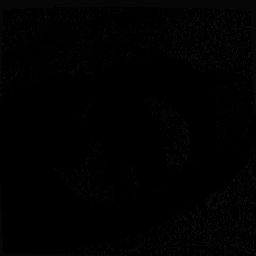
[im 60/60]
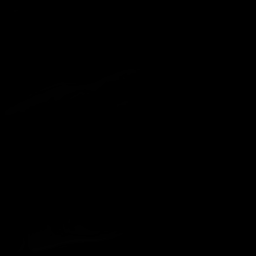

[Series 21: delayed ir prep · oblique · 8.0mm · 1.25mm/px · 1 of 8 slices shown (1 of 2)]
[im 1/8]
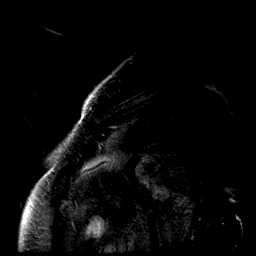

[Series 23: delayed ir prep · oblique · 8.0mm · 1.25mm/px · 1 of 4 slices shown (2 of 2)]
[im 1/4]
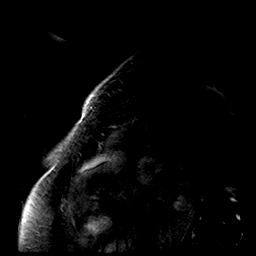

[Series 24: rad mde · oblique · 8.0mm · 1.21mm/px · 1 of 5 slices shown]
[im 1/5]
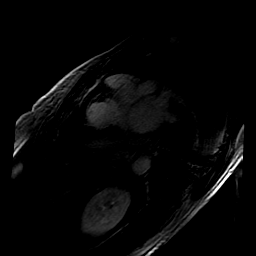

[Series 25: bSSFP · axial · 8.0mm · 1.37mm/px · z∈[+151,+180]mm · 5 of 100 slices shown (6 of 6)]
[im 1/100]
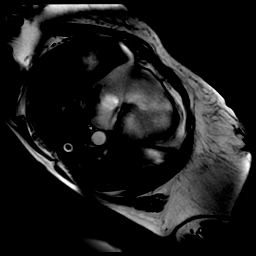
[im 25/100]
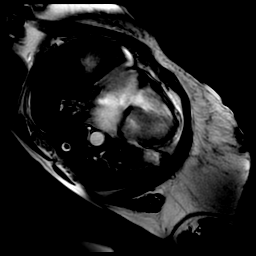
[im 50/100]
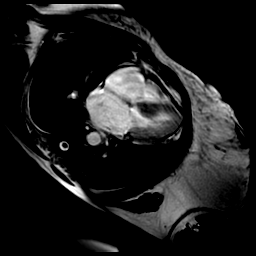
[im 75/100]
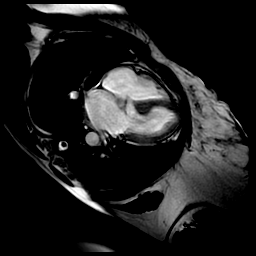
[im 100/100]
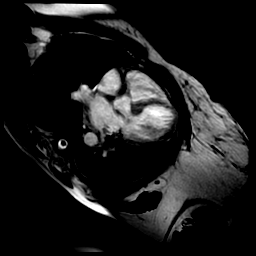

[39 of 40 positions shown; findings below may reference images not displayed]

FINDINGS: Haaris was mild LAE. The RA/RV were normal in size and function. There
was no ASD/VSD or pericardial effusion. The aortic root was normal.
The aortic valve was trileaflet with no stenosis. There was moderate
basal septal hypertrophy 15 mm compared to the mid septum and
posterior wall at 8 mm. There appear to be AMAZIGH. By flow analysis
only a mild LVOT gradient of 5 mmHg. There was mild MR

The quantitative EF was 76% (EDV 62 cc SV 47 cc EDV 16 cc) Delayed
enhancement images post gadolinium were normal with no evidence of
scar or myofibrillar disarray in the septum
IMPRESSION: 1) Moderate Basal septal hypertrophy not classic for HOCM morphology
with no delayed gadolium uptake to suggest myofibrillar disarray

2) There was AMAZIGH with small LVOT gradient in regard to physiology
and mild MR

3) Mild LAE

4) No delayed gadolinium uptake on inversion recovery sequences.

Lesya Aujla

## 2017-09-23 ENCOUNTER — Ambulatory Visit (INDEPENDENT_AMBULATORY_CARE_PROVIDER_SITE_OTHER): Payer: Medicare Other | Admitting: Orthopedic Surgery

## 2017-09-23 ENCOUNTER — Encounter (INDEPENDENT_AMBULATORY_CARE_PROVIDER_SITE_OTHER): Payer: Self-pay | Admitting: Orthopedic Surgery

## 2017-09-23 DIAGNOSIS — S78111A Complete traumatic amputation at level between right hip and knee, initial encounter: Secondary | ICD-10-CM

## 2017-09-23 DIAGNOSIS — Z89611 Acquired absence of right leg above knee: Secondary | ICD-10-CM

## 2017-09-23 NOTE — Progress Notes (Signed)
Office Visit Note   Patient: [name]           Date of Birth: February 03, 1941           MRN: [id number] Visit Date: 09/23/2017              Requested by: Maurice SmallGriffin, [name], MD [address] [place], [place] [address] PCP: Maurice SmallGriffin, [name], MD  Chief Complaint  Patient presents with  . Right Leg - Pain      HPI: Patient is a 77 year old woman who is about 10 years status post a right above-the-knee amputation.  Patient complains of her knee buckling and giving way she states she has had several episodes where she is at risk of falling.  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  Patient states that she has gained over 30 pounds and can no longer get her leg into the socket.  She complains of pinching medially and inability to click in the prosthesis due to her weight gain.  Assessment & Plan: Visit Diagnoses:  1. Above knee amputation of right lower extremity (HCC)     Plan: Patient is given a prescription for a new socket There a 4 bar hydraulic knee single axis foot.  Patient will follow-up with [facility] for her prosthetic fitting.  Follow-Up Instructions: Return if symptoms worsen or fail to improve.   Ortho Exam  Patient is alert, oriented, no adenopathy, well-dressed, normal affect, normal respiratory effort. Examination patient has an antalgic gait uses a walker.  Patient is a K2 level ambulator.  She has unstable leg with patient will instability.  Examination of the skin there is no skin breakdown no ulcers no cellulitis.  Imaging: No results found. No images are attached to the encounter.  Labs: No results found for: HGBA1C, ESRSEDRATE, CRP, LABURIC, REPTSTATUS, GRAMSTAIN, CULT, LABORGA  @LABSALLVALUES (HGBA1)@  There is no height or weight on file to calculate BMI.  Orders:  No orders of the defined types were placed in this encounter.  No orders of the defined types were  placed in this encounter.    Procedures: No procedures performed  Clinical Data: No additional findings.  ROS:  All other systems negative, except as noted in the HPI. Review of Systems  Objective: Vital Signs: There were no vitals taken for this visit.  Specialty Comments:  No specialty comments available.  PMFS History: Patient Active Problem List   Diagnosis Date Noted  . Takotsubo cardiomyopathy   . Hypertensive heart disease   . Brain aneurysm 04/13/2016  . Dyslipidemia   . CAD in native artery   . PAC (premature atrial contraction) 08/14/2014  . PVC's (premature ventricular contractions) 08/14/2014  . Palpitations 11/02/2013  . Essential hypertension, benign 11/01/2013  . Peripheral vascular disease, unspecified (HCC) 11/01/2013   Past Medical History:  Diagnosis Date  . CAD in native artery     NSTEMI with cath showing  75% DX1 lesion and moderate LVD 35-45% out of proportion to CAD and in a pattern of Takotsubo CM.  [name]. Dyslipidemia   . GERD (gastroesophageal reflux disease)   . Hypercholesteremia   . Hypertension   . Hypertensive heart disease    initially she was thought to have a HOCM variant by cath with increased LVOT gradient but echo showed only a 10mmHg gradient across the LVOT and SAM and cardiac MRI did not show any late gad enhancement and only  a LVOT gradient with 15mm BSH.  There was some mild SAM and mild MR.  EF normal at 76%.    . NSTEMI (non-ST elevated myocardial infarction) (HCC) 04/2016   secondary to stress CM - Takotsubo CM  . PAC (premature atrial contraction)    Event monitors 10/2013 and 08/2014 showed NSR, ST, PACs, PVCs.   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 Posterior communicating artery aneurysm    followed by Dr Venetia Maxon  . PVC's (premature ventricular contractions)    Event monitors 10/2013 and 08/2014 showed NSR, ST, PACs, PVCs.   . Takotsubo  cardiomyopathy    EF normalized on repeat echo    Family History  Problem Relation Age of Onset  . Heart attack Mother   . Heart disease Mother   . Heart attack Brother   . Heart disease Brother   . Heart attack Sister   . Heart disease Sister     Past Surgical History:  Procedure Laterality Date  . ABDOMINAL HYSTERECTOMY    . 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 CARDIAC CATHETERIZATION N/A 04/21/2016   Procedure: Left Heart Cath and Coronary Angiography;  Surgeon: Corky Crafts, MD;  Location: [facility] INVASIVE CV LAB;  Service: Cardiovascular;  Laterality: N/A;  . IR GENERIC HISTORICAL  02/13/2016   IR RADIOLOGY PERIPHERAL GUIDED IV START 02/13/2016 Berdine Dance, MD [facility]  . IR GENERIC HISTORICAL  02/13/2016   IR US GUIDE VASC ACCESS LEFT 02/13/2016 Berdine Dance, MD [facility]   Social History   Occupational History  . Not on file  Tobacco Use  . Smoking status: Former Smoker    Last attempt to quit: 07/13/2004    Years since quitting: 13.2  . Smokeless tobacco: Never Used  Substance and Sexual Activity  . Alcohol use: Yes    Comment: ocassionally   . Drug use: No  . Sexual activity: Not on file

## 2017-10-04 ENCOUNTER — Other Ambulatory Visit: Payer: Self-pay | Admitting: Cardiology

## 2017-10-09 ENCOUNTER — Other Ambulatory Visit: Payer: Self-pay | Admitting: Cardiology

## 2017-10-14 ENCOUNTER — Other Ambulatory Visit: Payer: Self-pay | Admitting: Cardiology

## 2017-10-18 ENCOUNTER — Telehealth: Payer: Self-pay | Admitting: Physician Assistant

## 2017-10-18 NOTE — Telephone Encounter (Signed)
New Message     *STAT* If patient is at the pharmacy, call can be transferred to refill team.   1. Which medications need to be refilled? (please list name of each medication and dose if known)   isosorbide mononitrate (IMDUR) 30 MG 24 hr tablet Take 0.5 tablets (15 mg total) by mouth daily.        2. Which pharmacy/location (including street and city if local pharmacy) is medication to be sent to? [facility]  3. Do they need a 30 day or 90 day supply?  30

## 2017-10-19 ENCOUNTER — Ambulatory Visit: Payer: Medicare Other | Admitting: Physician Assistant

## 2017-10-19 MED ORDER — ISOSORBIDE MONONITRATE ER 30 MG PO TB24: 15.0000 mg | ORAL_TABLET | Freq: Every day | ORAL | 0 refills | Status: DC

## 2017-10-19 NOTE — Telephone Encounter (Signed)
Returned call to pt. Left her a message that her refill has been sent to her pharmacy, [facility] [place].  Pt has an appt 11/16/17, so gave 30 day supply.

## 2017-11-02 ENCOUNTER — Telehealth (INDEPENDENT_AMBULATORY_CARE_PROVIDER_SITE_OTHER): Payer: Self-pay | Admitting: Orthopedic Surgery

## 2017-11-02 NOTE — Telephone Encounter (Signed)
OV note faxed [facility] [phone]

## 2017-11-15 ENCOUNTER — Ambulatory Visit: Payer: Medicare Other | Admitting: Cardiology

## 2017-11-15 ENCOUNTER — Encounter: Payer: Self-pay | Admitting: Physician Assistant

## 2017-11-15 NOTE — Progress Notes (Addendum)
Cardiology Office Note    Date:  11/16/2017  ID:  DOLL FRAZEE, DOB 10-29-1940, MRN [id number] PCP:  Maurice Small, MD  Cardiologist: Dr. Mayford Knife   Chief Complaint: f/u CAD  History of Present Illness:  [name] is a 77 y.o. 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 Venetia Maxon), PACs, PVCs who presents for 6 month follow-up.  She saw Dr. Mayford Knife for chest pain August 2017 which she felt was related to indigestion, partially relieved with Tums. Cardiac CT 02/2016 showed poor quality study due to motion artifact and somewhat poor filling of coronary arteries with tortuosity, 2 isolated foci in LM (nonobstructive), calcium score 23rd percentile, normal left dominant coronary arteries with noted motion artifact and poor distal vessel visualization. Dr. Mayford Knife recommended she try Protonix instead of omeprazole. Given continued intermittent chest pain, cardiac cath was recommended but initially deferred given ongoing follow-up of her brain aneurysm with plans to proceed after. The patient missed the radiology office's calls to schedule, and was admitted in the interim 04/2016 with chest pain and elevated troponin. Curbside neurosurgery call gave permission to proceed with cath, so this was done 04/21/16 revealed 25 mCx, 75% ostial D1, LVEF 35-45% in pattern of Takotsubo CM, and a 55 mmHg LVOT gradient felt due to subvalvular stenosis. There was severe tortuosity of the right subclavian making torquing catheters difficult. Consider left radial if cath needed in the future. There was no clear cause of stress for that particular event. F/u echo 05/2016 showed EF 60-65%, grade 1 DD, moderate focal hypertrophy of the basilar septum with a narrowed LV outflow tract and mild SAM. There was minimal LVOT gradient of .  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 She underwent cardiac MRI 07/2016 showing moderate basal septal hypertrophy not classic for HOCM morphology with no delayed gadolium uptake to suggest myofibrillar disarray, + SAM with small LVOT gradient in regard to physiology and mild MR, mild LAE, no delayed gad uptake. Last labs 08/2016 - K 3.8, CR 0.89, CBC wnl; 07/2017 - LFTs wnl, LDL 108. She saw Dr. Mayford Knife in 09/2016 with residual dyspnea so Toprol was increased.   She returns for follow-up and overall feels she is doing well. Towards the end of the visit she did instead note her chronic chest pain has increased subtly over the last year. She is more vague with her descriptions than my previous interactions with her. This happens about 2x a week, unprovoked, happening at rest mostly, resolving without intervention, lasting just a few minutes. It does not really happen with exertion but she has chronic unchanged dyspnea. She walks with a walker. She was seen in the ER for this 08/2017 at which EKG showed NSR with nonspecific ST-T changes.  She was started on Carafate for atypical CP possibly GI in nature. She has a chronically abnormal EKG from what I can tell in Epic but there seems to be issues with Adobe Acrobat allowing me to review old tracings beyond any that are scanned under a paperclip icon. She thinks her heart is racing during these episodes but isn't totally sure. The discomfort is relieved sometimes by putting her hand on her chest. It is also sometimes relieved with Tums. She's not taken NTG. She feels completely fine today and hasn't had an episode in several days. She ambulated into clinic with her walker without any cardiac  symptoms.   Past Medical History:  Diagnosis Date  . CAD in native artery    a. 04/2016 NSTEMI with cath showing 25% MCx, 75% DX1 lesion and moderate LVD 35-45% out of proportion to CAD and in a pattern of Takotsubo CM (no clear stressor)  . Dyslipidemia   . GERD (gastroesophageal reflux disease)    . Hypercholesteremia   . Hypertension   . Hypertensive heart disease    initially she was thought to have a HOCM variant by cath with increased LVOT gradient but echo showed only a gradient across the LVOT and SAM and cardiac MRI did not show any late gad enhancement and only a LVOT gradient with 15mm BSH.  There was some mild SAM and mild MR.  EF normal at 76%.    [name] LVH (left ventricular hypertrophy)    a. cMRI 2018 - showing moderate Basal septal hypertrophy not classic for HOCM morphology with no delayed gadolium uptake to suggest myofibrillar disarray, + SAM with small LVOT gradient in regard to physiology and mild MR.  . NSTEMI (non-ST elevated myocardial infarction) (HCC) 04/2016   secondary to stress CM - Takotsubo CM  . PAC (premature atrial contraction)    Event monitors 10/2013 and 08/2014 showed NSR, ST, PACs, PVCs.   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 Posterior communicating artery aneurysm    followed by Dr Venetia Maxon  . PVC's (premature ventricular contractions)    Event monitors 10/2013 and 08/2014 showed NSR, ST, PACs, PVCs.   . 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 ABDOMINAL HYSTERECTOMY    . 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 CARDIAC CATHETERIZATION N/A 04/21/2016   Procedure: Left Heart Cath and Coronary Angiography;  Surgeon: Corky Crafts, MD;  Location: [facility] INVASIVE CV LAB;  Service: Cardiovascular;  Laterality: N/A;  . IR GENERIC HISTORICAL  02/13/2016   IR RADIOLOGY PERIPHERAL GUIDED IV START 02/13/2016 Berdine Dance, MD [facility]  . IR GENERIC HISTORICAL  02/13/2016   IR US GUIDE VASC ACCESS LEFT 02/13/2016 Berdine Dance, MD [facility]    Current Medications: Current Meds  Medication Sig  . acetaminophen (TYLENOL) 325 MG tablet Take 2 tablets (650 mg total) by mouth every  4 (four) hours as needed for headache or mild pain.  [name] aspirin EC 81 MG EC tablet Take 1 tablet (81 mg total) by mouth daily. (Patient taking differently: Take 81 mg by mouth daily at 2 PM. )  . atorvastatin (LIPITOR) 40 MG tablet Take 40 mg by mouth at bedtime.  . Calcium Carbonate-Vitamin D (CALCIUM-D PO) Take 1 tablet by mouth daily.  . fluticasone (FLONASE) 50 MCG/ACT nasal spray Place 1 spray into both nostrils daily as needed (congestion).   . fluticasone (FLOVENT HFA) 110 MCG/ACT inhaler Inhale 1 puff into the lungs at bedtime.  . furosemide (LASIX) 20 MG tablet TAKE 1 TABLET (20 MG TOTAL) BY MOUTH DAILY AS NEEDED FOR FLUID OR EDEMA.  [name] gabapentin (NEURONTIN) 300 MG capsule Take 300 mg by mouth 3 (three) times daily.  . hydrochlorothiazide (HYDRODIURIL) 12.5 MG tablet Take 12.5 mg by mouth daily.   . isosorbide mononitrate (IMDUR) 30 MG 24 hr tablet Take 0.5 tablets (15 mg total) by mouth daily.  . meloxicam (MOBIC) 7.5 MG tablet Take 7.5  mg by mouth daily as needed (back pain).   . metoprolol succinate (TOPROL-XL) 50 MG 24 hr tablet Take 1 tablet (50 mg total) by mouth daily.  . Multiple Vitamin (MULTIVITAMIN WITH MINERALS) TABS tablet Take 1 tablet by mouth daily.  . Omega-3 Fatty Acids (FISH OIL PO) Take 1 capsule by mouth daily at 2 PM.   . pantoprazole (PROTONIX) 40 MG tablet Take 1 tablet (40 mg total) by mouth daily.  Bertram Gala Glycol-Propyl Glycol (SYSTANE OP) Place 1 drop into both eyes at bedtime.  . [DISCONTINUED] isosorbide mononitrate (IMDUR) 30 MG 24 hr tablet Take 0.5 tablets (15 mg total) by mouth daily.     Allergies:   Codeine and Penicillins   Social History   Socioeconomic History  . Marital status: Single    Spouse name: Not on file  . Number of children: Not on file  . Years of education: Not on file  . Highest education level: Not on file  Occupational History  . Not on file  Social Needs  . Financial resource strain: Not on file  . Food insecurity:      Worry: Not on file    Inability: Not on file  . Transportation needs:    Medical: Not on file    Non-medical: Not on file  Tobacco Use  . Smoking status: Former Smoker    Last attempt to quit: 07/13/2004    Years since quitting: 13.3  . Smokeless tobacco: Never Used  Substance and Sexual Activity  . Alcohol use: Yes    Comment: ocassionally   . Drug use: No  . Sexual activity: Not on file  Lifestyle  . Physical activity:    Days per week: Not on file    Minutes per session: Not on file  . Stress: Not on file  Relationships  . Social connections:    Talks on phone: Not on file    Gets together: Not on file    Attends religious service: Not on file    Active member of club or organization: Not on file    Attends meetings of clubs or organizations: Not on file    Relationship status: Not on file  Other Topics Concern  . Not on file  Social History Narrative  . Not on file     Family History:  Family History  Problem Relation Age of Onset  . Heart attack Mother   . Heart disease Mother   . Heart attack Brother   . Heart disease Brother   . Heart attack Sister   . Heart disease Sister      ROS:   Please see the history of present illness.  All other systems are reviewed and otherwise negative.    PHYSICAL EXAM:   VS:  BP (!) 144/62 (BP Location: Left Arm)   Pulse (!) 56   Ht  (1.575 m)   Wt 146 lb 4 oz (66.3 kg)   SpO2 95%   BMI 26.75 kg/m   BMI: Body mass index is 26.75 kg/m. 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 Scanned 12 lead from ER reviewed - NSR with nonspecific ST-T changes (chronically abnormal EKG in the recent years under scanned icons)  - difficulty accessing the crossed-over tracings due to Adobe Acrobat error  Recent Labs: 08/30/2017: BUN 14; Creatinine, Ser 0.89; Hemoglobin 13.4; Platelets 152; Potassium 3.8; Sodium 139   Lipid Panel    Component Value Date/Time   CHOL 176 04/22/2016 0017   TRIG 260 (H) 04/22/2016 0017   HDL 45 04/22/2016 0017   CHOLHDL 3.9 04/22/2016 0017   VLDL 52 (H) 04/22/2016 0017   LDLCALC 79 04/22/2016 0017    Additional studies/ records that were reviewed today include: Summarized above    ASSESSMENT & PLAN:   1. Atypical chest pain/chronic SOB - negative troponins in ER visit 08/2017. Her symptoms have been very difficult to sort out in the past. She is also quite vague in her descriptions. She is able to exert herself (albeit limited by walker) without any chest pain. She also has associated palpitations but waffles on this symptom as well when asking for clarification. She is in NSR by exam today. Will check 2D echo to evaluate hypertrophic cardiomyopathy/LVOT gradient and also obtain 30-day monitor to exclude arrhythmia causing her symptoms. Check labs. Addendum: it appears there is a neurology appointment in the system for next week for memory loss. There is no acute deficit, but she does not seem as detailed as my prior interactions with her. She called a family member to come pick her up after our visit. 2. CAD - continue current regimen. If echo unrevealing, would consider Lexiscan nuclear stress test to evaluate for residual CAD causing ischemia. 3. Takotsubo cardiomyopathy with  structural changes with basal septal hypertrophy - check echo as above. 4. HTN - BP upper limits of normal. However, would prefer to see what echo shows before progressing regimen - if HCM has progressed in any way, titration of diuretics or addition of amlodipine could potentially worsen her symptoms. 5. PVCs - plan event monitor as above. No ectopy heard on exam today. 6. Dyslipidemia - continue statin. She seemed somewhat overwhelmed comprehending the information we discussed today so held off on med adjustment but consideration could be given in the future to increasing atorvastatin further.  Disposition: F/u with Dr. Eugenie Filler team APP in 6 weeks.   Medication Adjustments/Labs and Tests Ordered: Current medicines are reviewed at length with the patient today.  Concerns regarding medicines are outlined above. Medication changes, Labs and Tests ordered today are summarized above and listed in the Patient Instructions accessible in Encounters.   Signed, Laurann Montana, PA-C  11/16/2017 4:51 PM    [facility] [address] [place], [place], [place]  [address] Phone: [phone]; Fax: [phone]

## 2017-11-16 ENCOUNTER — Encounter: Payer: Self-pay | Admitting: Physician Assistant

## 2017-11-16 ENCOUNTER — Ambulatory Visit (INDEPENDENT_AMBULATORY_CARE_PROVIDER_SITE_OTHER): Payer: Medicare Other | Admitting: Physician Assistant

## 2017-11-16 VITALS — BP 144/62 | HR 56 | Ht 62.0 in | Wt 146.2 lb

## 2017-11-16 DIAGNOSIS — R002 Palpitations: Secondary | ICD-10-CM | POA: Diagnosis not present

## 2017-11-16 DIAGNOSIS — I2510 Atherosclerotic heart disease of native coronary artery without angina pectoris: Secondary | ICD-10-CM | POA: Diagnosis not present

## 2017-11-16 DIAGNOSIS — E785 Hyperlipidemia, unspecified: Secondary | ICD-10-CM

## 2017-11-16 DIAGNOSIS — R0789 Other chest pain: Secondary | ICD-10-CM | POA: Diagnosis not present

## 2017-11-16 DIAGNOSIS — I10 Essential (primary) hypertension: Secondary | ICD-10-CM | POA: Diagnosis not present

## 2017-11-16 DIAGNOSIS — R0602 Shortness of breath: Secondary | ICD-10-CM | POA: Diagnosis not present

## 2017-11-16 DIAGNOSIS — I5181 Takotsubo syndrome: Secondary | ICD-10-CM | POA: Diagnosis not present

## 2017-11-16 MED ORDER — ISOSORBIDE MONONITRATE ER 30 MG PO TB24: 15.0000 mg | ORAL_TABLET | Freq: Every day | ORAL | 3 refills | Status: DC

## 2017-11-16 NOTE — Patient Instructions (Signed)
Medication Instructions:  Your physician recommends that you continue on your current medications as directed. Please refer to the Current Medication list given to you today.  Labwork: Your physician recommends that have lab work today- BMET, Mg, CBC, TSH, BNP  Testing/Procedures: Your physician has requested that you have an echocardiogram. Echocardiography is a painless test that uses sound waves to create images of your heart. It provides your doctor with information about the size and shape of your heart and how well your heart's chambers and valves are working. This procedure takes approximately one hour. There are no restrictions for this procedure.  Your physician has recommended that you wear an 30 day event monitor. Event monitors are medical devices that record the heart's electrical activity. Doctors most often [place] these monitors to diagnose arrhythmias. Arrhythmias are problems with the speed or rhythm of the heartbeat. The monitor is a small, portable device. You can wear one while you do your normal daily activities. This is usually used to diagnose what is causing palpitations/syncope (passing out).  Follow-Up: Your physician recommends that you schedule a follow-up appointment in: 6 weeks with Dr. Mayford Knife or someone on her care team.    If you need a refill on your cardiac medications before your next appointment, please call your pharmacy.

## 2017-11-17 ENCOUNTER — Telehealth: Payer: Self-pay | Admitting: Nurse Practitioner

## 2017-11-17 LAB — CBC WITH DIFFERENTIAL/PLATELET
BASOS ABS: 0 10*3/uL (ref 0.0–0.2)
Basos: 1 %
EOS (ABSOLUTE): 0.2 10*3/uL (ref 0.0–0.4)
Eos: 3 %
HEMOGLOBIN: 12.7 g/dL (ref 11.1–15.9)
Hematocrit: 39.2 % (ref 34.0–46.6)
Immature Grans (Abs): 0 10*3/uL (ref 0.0–0.1)
Immature Granulocytes: 0 %
LYMPHS ABS: 2.9 10*3/uL (ref 0.7–3.1)
Lymphs: 49 %
MCH: 29.1 pg (ref 26.6–33.0)
MCHC: 32.4 g/dL (ref 31.5–35.7)
MCV: 90 fL (ref 79–97)
MONOCYTES: 8 %
MONOS ABS: 0.5 10*3/uL (ref 0.1–0.9)
NEUTROS ABS: 2.2 10*3/uL (ref 1.4–7.0)
Neutrophils: 39 %
PLATELETS: 154 10*3/uL (ref 150–379)
RBC: 4.37 x10E6/uL (ref 3.77–5.28)
RDW: 14.2 % (ref 12.3–15.4)
WBC: 5.8 10*3/uL (ref 3.4–10.8)

## 2017-11-17 LAB — BASIC METABOLIC PANEL
BUN / CREAT RATIO: 17 (ref 12–28)
BUN: 15 mg/dL (ref 8–27)
CHLORIDE: 102 mmol/L (ref 96–106)
CO2: 23 mmol/L (ref 20–29)
CREATININE: 0.89 mg/dL (ref 0.57–1.00)
Calcium: 9.7 mg/dL (ref 8.7–10.3)
GFR calc Af Amer: 72 mL/min/{1.73_m2} (ref >59)
GFR calc non Af Amer: 62 mL/min/{1.73_m2} (ref >59)
GLUCOSE: 78 mg/dL (ref 65–99)
POTASSIUM: 3.9 mmol/L (ref 3.5–5.2)
SODIUM: 143 mmol/L (ref 134–144)

## 2017-11-17 LAB — PRO B NATRIURETIC PEPTIDE: NT-PRO BNP: 89 pg/mL (ref 0–738)

## 2017-11-17 LAB — TSH: TSH: 1.29 u[IU]/mL (ref 0.450–4.500)

## 2017-11-17 LAB — MAGNESIUM: Magnesium: 2.4 mg/dL — ABNORMAL HIGH (ref 1.6–2.3)

## 2017-11-17 NOTE — Telephone Encounter (Signed)
Notes recorded by Laurann Montana, PA-C on 11/17/2017 at 10:16 AM EDT Edited to add: yesterday I had issues pulling up EKGs in clinic due to Adobe Acrobat crashing. Since labs are unrevealing for any acute cause for her symptoms, I'd like to have her come back in for a comparison EKG as I am now able to access all of her old EKGs to compare to (instead of just the scanned version of 08/2017). Please call pt. (spoke with Eligha Bridegroom regarding this). Dayna Dunn PA-C  Called and left patient a message to call our office to come in today for ekg.

## 2017-11-17 NOTE — Telephone Encounter (Signed)
Tried to call patient again and left message for return call

## 2017-11-18 NOTE — Telephone Encounter (Signed)
I placed call to pt and left message to call office today

## 2017-11-18 NOTE — Telephone Encounter (Signed)
I spoke with pt.  She cannot come in today or tomorrow for an EKG.  I tried to schedule another time for her but she has to check on transportation and will call us back to schedule.

## 2017-11-23 ENCOUNTER — Encounter: Payer: Self-pay | Admitting: Neurology

## 2017-11-23 ENCOUNTER — Ambulatory Visit (INDEPENDENT_AMBULATORY_CARE_PROVIDER_SITE_OTHER): Payer: Medicare Other | Admitting: Neurology

## 2017-11-23 VITALS — BP 158/62 | HR 67 | Ht 62.0 in | Wt 149.0 lb

## 2017-11-23 DIAGNOSIS — I671 Cerebral aneurysm, nonruptured: Secondary | ICD-10-CM

## 2017-11-23 DIAGNOSIS — R413 Other amnesia: Secondary | ICD-10-CM | POA: Insufficient documentation

## 2017-11-23 NOTE — Telephone Encounter (Signed)
Left message for pt to call back re: EKG that needs to be repeated.

## 2017-11-23 NOTE — Progress Notes (Signed)
PATIENT: [name] DOB: February 16, 1941  Chief Complaint  Patient presents with  . New Patient (Initial Visit)    PCP: Dr. Maurice Small. Patient here with son, [name]. Referred for memory concerns and h/o of brain aneurysm.   . Memory Loss    MMSE: 27/30, Animals: 15     HISTORICAL  [name] is a 77 year old female, seen in refer by her primary care physician Dr. Valentina Lucks, [name] for evaluation of memory loss, she is accompanied by her son [name] at today's clinical visit, initial evaluation was on Nov 23, 2017.  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  She was noted to have gradual onset memory loss since February 2019, gradually getting worse, she has trouble managing her medications, still drives short distance, also was noted to have word finding difficulties, she still does volunteer work at [employer] sometimes, denies family history of dementia, is able to ambulate with prosthetic,  MRI of the brain in February 2019, chronic small vessel disease, chronic left basal ganglia, corona radiata small vessel stroke,  MRA of the brain showed intracranial atherosclerotic irregularity, 6 mm lobulated left PCA aneurysm.  Laboratory evaluation in January 2019 CMP, creatinine of 0.82, LDL was 86, normal hemoglobin of 12.5, TSH, B12  REVIEW OF SYSTEMS: Full 14 system review of systems performed and notable only for memory loss, headache, slurred speech, dizziness, shortness of breath, wheezing  ALLERGIES: Allergies  Allergen Reactions  . Ace Inhibitors Cough  . Codeine Itching and Rash  . Penicillins Itching and Rash    Has patient had a PCN reaction causing immediate rash, facial/tongue/throat swelling, SOB or  lightheadedness with hypotension: Yes Has patient had a PCN reaction causing severe rash involving mucus membranes or skin necrosis: No Has patient had a PCN reaction that required hospitalization: Unknown Has patient had a PCN reaction occurring within the last 10 years: No If all of the above answers are "NO", then may proceed with Cephalosporin use.    HOME MEDICATIONS: Current Outpatient Medications  Medication Sig Dispense Refill  . acetaminophen (TYLENOL) 325 MG tablet Take 2 tablets (650 mg total) by mouth every 4 (four) hours as needed for headache or mild pain.    [name] aspirin EC 81 MG EC tablet Take 1 tablet (81 mg total) by mouth daily. (Patient taking differently: Take 81 mg by mouth daily at 2 PM. )    . atorvastatin (LIPITOR) 40 MG tablet Take 40 mg by mouth at bedtime.    . Calcium Carbonate-Vitamin D (CALCIUM-D PO) Take 1 tablet by mouth daily.    [name] esomeprazole (NEXIUM) 40 MG capsule Take 40 mg by mouth daily at 12 noon.    . fluticasone (FLONASE) 50 MCG/ACT nasal spray Place 1 spray into both nostrils daily as needed (congestion).     . fluticasone (FLOVENT HFA) 110 MCG/ACT inhaler Inhale 1 puff into the lungs at bedtime.    . gabapentin (NEURONTIN) 300 MG capsule Take 300 mg by mouth 3 (three) times daily.    . hydrochlorothiazide (HYDRODIURIL) 12.5 MG tablet Take 12.5 mg by mouth daily.   5  . isosorbide mononitrate (IMDUR) 30 MG 24 hr tablet Take 0.5 tablets (15 mg total) by mouth daily. 45  tablet 3  . meloxicam (MOBIC) 7.5 MG tablet Take 7.5 mg by mouth daily as needed (back pain).     . metoprolol succinate (TOPROL-XL) 50 MG 24 hr tablet Take 1 tablet (50 mg total) by mouth daily. 90 tablet 0  . Multiple Vitamin (MULTIVITAMIN WITH MINERALS) TABS tablet Take 1 tablet by mouth daily.    . Omega-3 Fatty Acids (FISH OIL PO) Take 1 capsule by mouth daily at 2 PM.     . vitamin B-12 (CYANOCOBALAMIN) 500 MCG tablet Take 1,000 mcg by mouth daily.     No current  facility-administered medications for this visit.     PAST MEDICAL HISTORY: Past Medical History:  Diagnosis Date  . CAD in native artery    a. 04/2016 NSTEMI with cath showing 25% MCx, 75% DX1 lesion and moderate LVD 35-45% out of proportion to CAD and in a pattern of Takotsubo CM (no clear stressor)  . Dyslipidemia   . GERD (gastroesophageal reflux disease)   . Hypercholesteremia   . Hypertension   . Hypertensive heart disease    initially she was thought to have a HOCM variant by cath with increased LVOT gradient but echo showed only a gradient across the LVOT and SAM and cardiac MRI did not show any late gad enhancement and only a LVOT gradient with 15mm BSH.  There was some mild SAM and mild MR.  EF normal at 76%.    [name] LVH (left ventricular hypertrophy)    a. cMRI 2018 - showing moderate Basal septal hypertrophy not classic for HOCM morphology with no delayed gadolium uptake to suggest myofibrillar disarray, + SAM with small LVOT gradient in regard to physiology and mild MR.  . NSTEMI (non-ST elevated myocardial infarction) (HCC) 04/2016   secondary to stress CM - Takotsubo CM  . PAC (premature atrial contraction)    Event monitors 10/2013 and 08/2014 showed NSR, ST, PACs, PVCs.   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 Posterior communicating artery aneurysm    followed by Dr Venetia Maxon  . PVC's (premature ventricular contractions)    Event monitors 10/2013 and 08/2014 showed NSR, ST, PACs, PVCs.   . 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 ABDOMINAL HYSTERECTOMY    . 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 CARDIAC CATHETERIZATION N/A 04/21/2016   Procedure: Left Heart Cath and Coronary Angiography;  Surgeon: Corky Crafts, MD;  Location: [facility] INVASIVE CV LAB;  Service:  Cardiovascular;  Laterality: N/A;  . IR GENERIC HISTORICAL  02/13/2016   IR RADIOLOGY PERIPHERAL GUIDED IV START 02/13/2016 Berdine Dance, MD [facility]  . IR GENERIC HISTORICAL  02/13/2016   IR US GUIDE VASC ACCESS LEFT 02/13/2016 Berdine Dance, MD [facility]    FAMILY HISTORY: Family History  Problem Relation Age of Onset  . Heart attack Mother   . Heart disease Mother   . Heart attack Brother   . Heart disease Brother   . Heart attack Sister   . Heart disease Sister     SOCIAL HISTORY:  Social History   Socioeconomic History  . Marital status: Single    Spouse name: Not on file  . Number of children: Not on file  . Years of education: Not on file  . Highest education level:  Not on file  Occupational History  . Not on file  Social Needs  . Financial resource strain: Not on file  . Food insecurity:    Worry: Not on file    Inability: Not on file  . Transportation needs:    Medical: Not on file    Non-medical: Not on file  Tobacco Use  . Smoking status: Former Smoker    Last attempt to quit: 07/13/2004    Years since quitting: 13.3  . Smokeless tobacco: Never Used  Substance and Sexual Activity  . Alcohol use: Yes    Comment: ocassionally   . Drug use: No  . Sexual activity: Not on file  Lifestyle  . Physical activity:    Days per week: Not on file    Minutes per session: Not on file  . Stress: Not on file  Relationships  . Social connections:    Talks on phone: Not on file    Gets together: Not on file    Attends religious service: Not on file    Active member of club or organization: Not on file    Attends meetings of clubs or organizations: Not on file    Relationship status: Not on file  . Intimate partner violence:    Fear of current or ex partner: Not on file    Emotionally abused: Not on file    Physically abused: Not on file    Forced sexual activity: Not on file  Other Topics Concern  . Not on file  Social History Narrative  . Not on file      PHYSICAL EXAM   Vitals:   11/23/17 1428  BP: (!) 158/62  Pulse: 67  Weight: 149 lb (67.6 kg)  Height:  (1.575 m)    Not recorded      Body mass index is 27.25 kg/m.  PHYSICAL EXAMNIATION:  Gen: NAD, conversant, well nourised, obese, well groomed                     Cardiovascular: Regular rate rhythm, no peripheral edema, warm, nontender. Eyes: Conjunctivae clear without exudates or hemorrhage Neck: Supple, no carotid bruits. Pulmonary: Clear to auscultation bilaterally   NEUROLOGICAL EXAM:  MMSE - Mini Mental State Exam 11/23/2017  Orientation to time 5  Orientation to Place 5  Registration 3  Attention/ Calculation 3  Recall 3  Language- name 2 objects 2  Language- repeat 1  Language- follow 3 step command 3  Language- read & follow direction 1  Write a sentence 1  Copy design 0  Total score 27    CRANIAL NERVES: CN II: Visual fields are full to confrontation. Fundoscopic exam is normal with sharp discs and no vascular changes. Pupils are round equal and briskly reactive to light. CN III, IV, VI: extraocular movement are normal. No ptosis. CN V: Facial sensation is intact to pinprick in all 3 divisions bilaterally. Corneal responses are intact.  CN VII: Face is symmetric with normal eye closure and smile. CN VIII: Hearing is normal to rubbing fingers CN IX, X: Palate elevates symmetrically. Phonation is normal. CN XI: Head turning and shoulder shrug are intact CN XII: Tongue is midline with normal movements and no atrophy.  MOTOR: There is no pronator drift of out-stretched arms. Muscle bulk and tone are normal. Muscle strength is normal.  REFLEXES: Reflexes are 2+ and symmetric at the biceps, triceps,  Plantar responses are flexor.  SENSORY: Intact to light touch, pinprick, positional sensation  and vibratory sensation are intact in fingers and toes.  COORDINATION: Rapid alternating movements and fine finger movements are intact. There is no  dysmetria on finger-to-nose and heel-knee-shin.    GAIT/STANCE: Need assistance to get up from seated position, unsteady with right AKA, prosthetic   DIAGNOSTIC DATA (LABS, IMAGING, TESTING) - I reviewed patient records, labs, notes, testing and imaging myself where available.   ASSESSMENT AND PLAN  KHAMRYN CALDERONE is a 77 y.o. female   Mild cognitive impairment  Mini-Mental Status Examination 27/30,  Laboratory evaluation showed no treatable etiology  She wants to hold off Namenda and Aricept treatment,  Left PCA lobulated 6 mm aneurysm,  Stable versus slight worsening compared to previous scans,  Referred to neurosurgeon for evaluation   Levert Feinstein, M.D. Ph.D.  [facility] [address], [address] [place], [place] [address] Ph: [phone] Fax: [phone]  [id number], Cindy Lose, MD

## 2017-12-02 NOTE — Telephone Encounter (Signed)
Left message to call back to schedule appt for repeat EKG.

## 2017-12-08 ENCOUNTER — Telehealth: Payer: Self-pay | Admitting: *Deleted

## 2017-12-08 NOTE — Telephone Encounter (Signed)
Pt has been made aware that she does need a ekg to be done since she is still having chest pains every now and then and when pt was seen in the office, she stated she was fine, until she was walking out the door, pt mentioned chest pain but EKG still not received. Pt understanding our recommendations and will call us back if she can arrange transportation.

## 2017-12-08 NOTE — Telephone Encounter (Signed)
All we can do is make the recommendation for her to come in. It would be advisable before that date. If she is not having any more symptoms, we can hold off.   PA-C

## 2017-12-08 NOTE — Telephone Encounter (Signed)
Pt called back and talked about her coming back in for a EKG. Pt said it's not easy for her to come in for a EKG, and that she would have to find someone, and she stated that that was a hard thing for her to do.  She asked could she not get one when she comes to have her monitor put on on 12/28/17?  Not sure who will do it when she comes in, but please advise!

## 2017-12-13 ENCOUNTER — Other Ambulatory Visit: Payer: Self-pay | Admitting: Family Medicine

## 2017-12-13 DIAGNOSIS — Z1231 Encounter for screening mammogram for malignant neoplasm of breast: Secondary | ICD-10-CM

## 2017-12-20 NOTE — Telephone Encounter (Signed)
Left multiple messages to call back to schedule f/u EKG. Left message with emergency contact to call back. Will send letter and close encounter.

## 2017-12-28 ENCOUNTER — Other Ambulatory Visit: Payer: Self-pay | Admitting: Physician Assistant

## 2017-12-28 ENCOUNTER — Ambulatory Visit (HOSPITAL_COMMUNITY): Payer: Medicare Other | Attending: Cardiovascular Disease

## 2017-12-28 ENCOUNTER — Other Ambulatory Visit: Payer: Self-pay

## 2017-12-28 ENCOUNTER — Ambulatory Visit (INDEPENDENT_AMBULATORY_CARE_PROVIDER_SITE_OTHER): Payer: Medicare Other

## 2017-12-28 DIAGNOSIS — I493 Ventricular premature depolarization: Secondary | ICD-10-CM

## 2017-12-28 DIAGNOSIS — R0789 Other chest pain: Secondary | ICD-10-CM | POA: Diagnosis not present

## 2017-12-28 DIAGNOSIS — Z87891 Personal history of nicotine dependence: Secondary | ICD-10-CM | POA: Insufficient documentation

## 2017-12-28 DIAGNOSIS — R000 Tachycardia, unspecified: Secondary | ICD-10-CM | POA: Diagnosis not present

## 2017-12-28 DIAGNOSIS — I252 Old myocardial infarction: Secondary | ICD-10-CM | POA: Diagnosis not present

## 2017-12-28 DIAGNOSIS — R0602 Shortness of breath: Secondary | ICD-10-CM

## 2017-12-28 DIAGNOSIS — I517 Cardiomegaly: Secondary | ICD-10-CM | POA: Diagnosis not present

## 2017-12-28 DIAGNOSIS — I350 Nonrheumatic aortic (valve) stenosis: Secondary | ICD-10-CM | POA: Insufficient documentation

## 2017-12-30 ENCOUNTER — Encounter: Payer: Self-pay | Admitting: *Deleted

## 2017-12-30 ENCOUNTER — Telehealth: Payer: Self-pay | Admitting: *Deleted

## 2017-12-30 DIAGNOSIS — R0602 Shortness of breath: Secondary | ICD-10-CM

## 2017-12-30 NOTE — Telephone Encounter (Signed)
-----   Message from Laurann Montanaayna N Dunn, [place]-C sent at 12/30/2017  1:41 PM EDT ----- Please let patient know echo continues to show severe thickening of muscle but no acute loss in function. Await event monitor. Dr. Mayford Knifeurner recommends nuclear stress test. She is amputee therefore would arrange Lexiscan nuclear stress test.Dayna Dunn PA-C

## 2017-12-31 ENCOUNTER — Telehealth (HOSPITAL_COMMUNITY): Payer: Self-pay | Admitting: Cardiology

## 2018-01-03 ENCOUNTER — Ambulatory Visit: Payer: Medicare Other

## 2018-01-07 ENCOUNTER — Ambulatory Visit
Admission: RE | Admit: 2018-01-07 | Discharge: 2018-01-07 | Disposition: A | Discharge status: Home / Self Care | Payer: Medicare Other | Source: Ambulatory Visit | Attending: Family Medicine | Admitting: Family Medicine

## 2018-01-07 ENCOUNTER — Ambulatory Visit: Payer: Medicare Other

## 2018-01-07 DIAGNOSIS — Z1231 Encounter for screening mammogram for malignant neoplasm of breast: Secondary | ICD-10-CM

## 2018-01-10 NOTE — Telephone Encounter (Signed)
Spoke with patient to try to scheduled her for a myoview appt that was suggested by Dr. Mayford Knifeurner. Patient stated that she is currently wearing an event monitor which will have to be turned in around 7/18 and she will more than likely be out of town the following week. Transportation is an issue for her and may not be able [facility] but she will contact the person who brings her and will call me back to inform me if she will be able to come in on that day.

## 2018-01-12 ENCOUNTER — Telehealth (INDEPENDENT_AMBULATORY_CARE_PROVIDER_SITE_OTHER): Payer: Self-pay | Admitting: Orthopedic Surgery

## 2018-01-12 ENCOUNTER — Other Ambulatory Visit (INDEPENDENT_AMBULATORY_CARE_PROVIDER_SITE_OTHER): Payer: Self-pay

## 2018-01-12 DIAGNOSIS — S78111A Complete traumatic amputation at level between right hip and knee, initial encounter: Secondary | ICD-10-CM

## 2018-01-12 NOTE — Telephone Encounter (Signed)
Patient called stating that she needs to have Dr. Lajoyce Cornersuda to write a prescription for PT so that she can learn to walk with her new leg.  CB#[phone].  Thank you.

## 2018-01-12 NOTE — Telephone Encounter (Signed)
[facility]

## 2018-01-20 ENCOUNTER — Encounter: Payer: Self-pay | Admitting: Cardiology

## 2018-01-21 ENCOUNTER — Other Ambulatory Visit: Payer: Self-pay | Admitting: Cardiology

## 2018-01-21 ENCOUNTER — Other Ambulatory Visit: Payer: Self-pay | Admitting: Internal Medicine

## 2018-01-26 ENCOUNTER — Telehealth (INDEPENDENT_AMBULATORY_CARE_PROVIDER_SITE_OTHER): Payer: Self-pay | Admitting: Orthopedic Surgery

## 2018-01-26 NOTE — Telephone Encounter (Signed)
Called and advised the pt that this had been completed on 01/12/18. She states that she will call and make an appt and will call with any questions.

## 2018-01-26 NOTE — Telephone Encounter (Signed)
Patient called to request a script for PT.  Please call patient to advise. [phone]

## 2018-01-28 ENCOUNTER — Telehealth (HOSPITAL_COMMUNITY): Payer: Self-pay | Admitting: Cardiology

## 2018-01-28 NOTE — Telephone Encounter (Signed)
01/10/18 Spoke with patient and she voiced that her transportation had planned to bring her on 7/30 and will need to check with her to see if they are available to bring her on 02/07/18..RG  01/20/18 unable to lm [email or web address] 01/28/18 Patient stated that she has a problem with transportation..RG   [facility]

## 2018-02-08 ENCOUNTER — Ambulatory Visit: Payer: Medicare Other | Admitting: Cardiology

## 2018-02-15 ENCOUNTER — Telehealth: Payer: Self-pay

## 2018-02-15 NOTE — Telephone Encounter (Signed)
Notes recorded by Sigurd Sosapp, , RN on 02/15/2018 at 12:57 PM EDT Spoke with patient to reschedule Lexiscan Stress Test. She would like a call Monday 8/12, to give us some dates, because her transportation availability will be back in town. ------

## 2018-02-15 NOTE — Telephone Encounter (Signed)
-----   Message from Traci R Turner, MD sent at 02/02/2018  1:40 PM EDT ----- Her nuclear stress test needs to be scheduled for 7/30 that is why we cancelled her appt with me 

## 2018-02-21 ENCOUNTER — Ambulatory Visit: Payer: Medicare Other | Attending: Orthopedic Surgery | Admitting: Physical Therapy

## 2018-02-21 ENCOUNTER — Telehealth: Payer: Self-pay

## 2018-02-21 ENCOUNTER — Other Ambulatory Visit: Payer: Self-pay

## 2018-02-21 DIAGNOSIS — M25651 Stiffness of right hip, not elsewhere classified: Secondary | ICD-10-CM | POA: Diagnosis present

## 2018-02-21 DIAGNOSIS — R2681 Unsteadiness on feet: Secondary | ICD-10-CM

## 2018-02-21 DIAGNOSIS — M6281 Muscle weakness (generalized): Secondary | ICD-10-CM

## 2018-02-21 DIAGNOSIS — R2689 Other abnormalities of gait and mobility: Secondary | ICD-10-CM | POA: Diagnosis not present

## 2018-02-21 DIAGNOSIS — Z9181 History of falling: Secondary | ICD-10-CM | POA: Diagnosis present

## 2018-02-21 NOTE — Telephone Encounter (Signed)
-----   Message from Quintella Reichertraci R Turner, MD sent at 02/02/2018  1:40 PM EDT ----- Her nuclear stress test needs to be scheduled for 7/30 that is why we cancelled her appt with me

## 2018-02-21 NOTE — Therapy (Signed)
[facility] [address] [place], [place], [address] Phone: [phone]   Fax:  [phone]  Physical Therapy Evaluation  Patient Details  Name: [name] MRN: [id number] Date of Birth: [birth year] Referring Provider: Aldean Baker, MD   Encounter Date: 02/21/2018  PT End of Session - 02/21/18 1216    Visit Number  1    Number of Visits  26    Date for PT Re-Evaluation  05/20/18    Authorization Type  [employer] Medicare & Medicaid    PT Start Time  1010    PT Stop Time  1100    PT Time Calculation (min)  50 min    Equipment Utilized During Treatment  Gait belt    Activity Tolerance  Patient tolerated treatment well    Behavior During Therapy  WFL for tasks assessed/performed       Past Medical History:  Diagnosis Date  . CAD in native artery    a. 04/2016 NSTEMI with cath showing 25% MCx, 75% DX1 lesion and moderate LVD 35-45% out of proportion to CAD and in a pattern of Takotsubo CM (no clear stressor)  . Dyslipidemia   . GERD (gastroesophageal reflux disease)   . Hypercholesteremia   . Hypertension   . Hypertensive heart disease    initially she was thought to have a HOCM variant by cath with increased LVOT gradient but echo showed only a gradient across the LVOT and SAM and cardiac MRI did not show any late gad enhancement and only a LVOT gradient with 15mm BSH.  There was some mild SAM and mild MR.  EF normal at 76%.    [name] LVH (left ventricular hypertrophy)    a. cMRI 2018 - showing moderate Basal septal hypertrophy not classic for HOCM morphology with no delayed gadolium uptake to suggest myofibrillar disarray, + SAM with small LVOT gradient in regard to physiology and mild MR.  . NSTEMI (non-ST elevated myocardial infarction) (HCC) 04/2016   secondary to stress CM - Takotsubo CM  . PAC (premature atrial contraction)    Event monitors 10/2013 and 08/2014 showed NSR, ST, PACs, PVCs.   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 Posterior communicating artery aneurysm    followed by Dr Venetia Maxon  . PVC's (premature ventricular contractions)    Event monitors 10/2013 and 08/2014 showed NSR, ST, PACs, PVCs.   . 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 ABDOMINAL HYSTERECTOMY    . 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 CARDIAC CATHETERIZATION N/A 04/21/2016   Procedure: Left Heart Cath and Coronary Angiography;  Surgeon: Corky Crafts, MD;  Location: [facility] INVASIVE CV LAB;  Service: Cardiovascular;  Laterality: N/A;  . IR GENERIC HISTORICAL  02/13/2016   IR RADIOLOGY PERIPHERAL GUIDED IV START 02/13/2016 Berdine Dance, MD [facility]-INTERV RAD  . IR GENERIC HISTORICAL  02/13/2016   IR US GUIDE VASC ACCESS LEFT 02/13/2016 Berdine Dance, MD [facility]    There were no vitals filed for this visit.   Subjective Assessment - 02/21/18 1009    Subjective  This 77yo female was referred to Physical Therapy on 01/12/2018 by Aldean Baker, MD for her Right Transfemroal Amputation.  She underwent a right Transfemoral Amputation in 2008 due to vascular issues. She recieved a  new prosthesis on 01/07/2018.     Pertinent History  R TFA, CAD, HTN, NSTEMI, PAD,     Patient Stated Goals  She wants to learn to use new prosthesis to stand & walk better.     Currently in Pain?  No/denies         [facility] PT Assessment - 02/21/18 1015      Assessment   Medical Diagnosis  Right Transfemoral Amputation    Referring Provider  Aldean BakerMarcus Duda, MD    Onset Date/Surgical Date  01/07/18   new prosthesis delivered   Hand Dominance  Right    Prior Therapy  22 visits 05/19/16-09/17/16      Precautions   Precautions  Fall      Balance Screen   Has the patient fallen in the past 6 months  Yes    How many times?  1   standing with new prosthesis & knee buckled    Has the  patient had a decrease in activity level because of a fear of falling?   No    Is the patient reluctant to leave their home because of a fear of falling?   No      Home Environment   Living Environment  Private residence    Living Arrangements  Alone    Type of Home  House    Home Access  Ramped entrance   back door has 5 steps with 2 rails    Home Layout  One level    Home Equipment  Walker - 2 wheels;Cane - single point;Cane - quad;Shower seat;Grab bars - tub/shower;Wheelchair - manual      Prior Function   Level of Independence  Independent;Independent with household mobility with device;Independent with community mobility with device    Vocation  Retired    Leisure  window shopping,       ROM / Strength   AROM / PROM / Strength  Strength;AROM      AROM   Overall AROM   Within functional limits for tasks performed      Strength   Overall Strength  Within functional limits for tasks performed      Transfers   Transfers  Sit to Stand;Stand to Sit    Sit to Stand  5: Supervision;With upper extremity assist;With armrests;From chair/3-in-1   requires UE support on RW to stabilize   Sit to Stand Details (indicate cue type and reason)  PT demo & verbal instruction in technique with new prosthesis & minimizing need for RW support    Stand to Sit  5: Supervision;With upper extremity assist;With armrests;To chair/3-in-1   requires UE support on RW for stability   Stand to Sit Details  PT demo & verbal instruction in technique with new prosthesis & minimizing need for RW support      Ambulation/Gait   Ambulation/Gait  Yes    Ambulation/Gait Assistance  5: Supervision;4: Min assist   supervision with RW & minA with [facility]   Ambulation/Gait Assistance Details  prosthetic knee flexion ~50% of steps & minimal amount of flexion,  excessive UE weight bearing and decreased prosthetic weight bearing, right pelvic retraction/ posterior rotation    Ambulation Distance (Feet)  100 Feet   100' x 2  RW and 40' X 1 SBQC   Assistive device  Prosthesis;Rolling walker;Small based quad cane   new prosthesis has Total knee which unlocks toe pressure   Gait Pattern  Step-through pattern;Decreased step length - left;Decreased stance time - right;Decreased stride  length;Decreased hip/knee flexion - right;Decreased weight shift to right;Right hip hike;Antalgic;Lateral hip instability;Trunk flexed    Ambulation Surface  Level;Indoor    Gait velocity  1.05 ft/sec with RW & prosthesis      Standardized Balance Assessment   Standardized Balance Assessment  Berg Balance Test;Timed Up and Go Test      Berg Balance Test   Sit to Stand  Needs minimal aid to stand or to stabilize   with RW support = 3   Standing Unsupported  Able to stand 2 minutes with supervision   with RW support = 4   Sitting with Back Unsupported but Feet Supported on Floor or Stool  Able to sit safely and securely 2 minutes    Stand to Sit  Uses backs of legs against chair to control descent   with RW support = 3   Transfers  Able to transfer safely, definite need of hands    Standing Unsupported with Eyes Closed  Unable to keep eyes closed 3 seconds but stays steady   with RW support = 3   Standing Ubsupported with Feet Together  Needs help to attain position but able to stand for 30 seconds with feet together   with RW support = 3   From Standing, Reach Forward with Outstretched Arm  Reaches forward but needs supervision   with RW support = 3   From Standing Position, Pick up Object from Floor  Unable to try/needs assist to keep balance   with RW support = 3   From Standing Position, Turn to Look Behind Over each Shoulder  Needs supervision when turning   with RW support = 3   Turn 360 Degrees  Needs assistance while turning   with RW support = 1   Standing Unsupported, Alternately Place Feet on Step/Stool  Needs assistance to keep from falling or unable to try   with RW support = 1   Standing Unsupported, One Foot in  [employer]orporatedFront  Loses balance while stepping or standing   with RW support = 2   Standing on One Leg  Unable to try or needs assist to prevent fall   with RW support = 4   Total Score  17    Berg comment:  Berg Balance Task with RW support = 40/56      Timed Up and Go Test   Normal TUG (seconds)  43.86   RW & prosthesis     Prosthetics Assessment - 02/21/18 1015      Prosthetics   Prosthetic Care Independent with  Skin check;Residual limb care;Prosthetic cleaning;Care of non-amputated limb;Ply sock cleaning    Prosthetic Care Dependent with  Correct ply sock adjustment;Proper wear schedule/adjustment;Proper weight-bearing schedule/adjustment    Prosthetic Care Comments   PT demo & instructed in control of new prosthetic knee.     Donning prosthesis   Supervision    Doffing prosthesis   Modified independent (Device/Increase time)    Current prosthetic wear tolerance (days/week)   daily since new prosthesis delivery 45 days ago    Current prosthetic wear tolerance (#hours/day)   reports varies wear from 3-8 hours.     Edema  none    Residual limb condition   no issues per pt report    K code/activity level with prosthetic use   New prosthesis is multiaxial knee that locks with extension & unlocks with toe pressure, ischial containment socket with flexible inner liner, silicon liner with shuttle pin lock and K2 flexible keel  foot.  Patient reports new socket presses into buttocks and inner thigh. She plans to make an appointment with prosthetist.                Objective measurements completed on examination: See above findings.      [facility] Adult PT Treatment/Exercise - 02/21/18 1015      Prosthetics   Education Provided  Proper Donning;Proper wear schedule/adjustment;Other (comment)   sit to/from stand w/ new prosthesis, standing foot position    Person(s) Educated  Patient    Education Method  Explanation;Demonstration;Tactile cues;Verbal cues    Education Method  Verbalized  understanding;Returned demonstration;Verbal cues required;Needs further instruction             PT Education - 02/21/18 1030    Education Details  residual limb pain, phantom sensation & phantom pain, common triggers of cold & stress, wearing prosthesis awake hours & swaddling/modified pajama pants at night    Person(s) Educated  Patient    Methods  Explanation    Comprehension  Verbalized understanding       PT Short Term Goals - 02/21/18 1515      PT SHORT TERM GOAL #1   Title  Patient sit to/from stand chairs with armrests to cane engaging prosthetic knee properly with supervision. (Target Date: 03/24/2018)    Time  1    Period  Months    Status  New    Target Date  03/24/18      PT SHORT TERM GOAL #2   Title  Patient reaches forward 5" without UE support with supervision.     Time  1    Period  Months    Status  New    Target Date  03/24/18      PT SHORT TERM GOAL #3   Title  Patient ambulates 300' with RW & prosthesis with cues on prosthesis control.     Time  1    Period  Months    Status  New    Target Date  03/24/18        PT Long Term Goals - 02/21/18 1511      PT LONG TERM GOAL #1   Title  Patient verbalizes & demonstrates understanding of prosthetic care with new prosthesis to enable safe use. (Target Date: 07/20/2017)    Baseline        Time  3    Period  Months    Status  New    Target Date  05/20/18      PT LONG TERM GOAL #2   Title  Patient tolerates wear of new prosthesis >90% of awake hours without skin issues to enable safe use throughout her day.    Baseline        Time  3    Period  Months    Status  New    Target Date  05/20/18      PT LONG TERM GOAL #3   Title  Patient ambulates 400' with LRAD & new prosthesis modified independent for community mobility.     Baseline        Time  3    Period  Months    Status  New    Target Date  05/20/18      PT LONG TERM GOAL #4   Title  Patient negotiates ramps, curbs & stairs with LRAD &  new prosthesis modified independent for community access.     Baseline  Time  3    Period  Months    Status  New    Target Date  05/20/18      PT LONG TERM GOAL #5   Title  Patient ambulates around furniture 100' with LRAD (single UE support) and new prosthesis carrying cup of water modified independent.    Baseline        Time  3    Period  Months    Status  New    Target Date  05/20/18      Additional Long Term Goals   Additional Long Term Goals  Yes      PT LONG TERM GOAL #6   Title  Berg Balance > 36/56 to indicate lower fall risk.    Baseline        Time  3    Period  Months    Status  New    Target Date  05/20/18             Plan - 02/21/18 1221    Clinical Impression Statement  This 77yo female recieved her new prosthesis on 01/07/2018 and is dependent in proper use of new knee. She has fallen one time without injury using her new prosthesis. She reports daily wear since delivery but only for 3-8 hours of each day. She reports some phantom pain that limits wear. Her gait with prosthesis with rolling walker is dependent on rolling walker with decreased prosthetic weight bearing & knee flexion in swing. She requires minimal assist to ambulate with SBQCane & prosthesis. Her balance is impaired with high fall risk with [employer] 17/56. Tasks of Berg performed with RW support 40/56. Patient would benefit from skilled care to improve function & safety with new prosthesis.     History and Personal Factors relevant to plan of care:  Lives alone, R TFA, CAD, HTN, NSTEMI, PAD,     Clinical Presentation  Stable    Clinical Decision Making  Moderate    Rehab Potential  Good    PT Frequency  2x / week    PT Duration  Other (comment)   90 days (13 weeks)   PT Treatment/Interventions  ADLs/Self Care Home Management;DME Instruction;Gait training;Stair training;Functional mobility training;Therapeutic activities;Balance training;Therapeutic exercise;Neuromuscular  re-education;Patient/family education;Prosthetic Training;Manual techniques;Canalith Repostioning;Vestibular    PT Next Visit Plan  review sit to/from stand minimizing RW support, HEP balance standing, prosthetic gait with RW work on pelvic orientation    Consulted and Agree with Plan of Care  Patient       Patient will benefit from skilled therapeutic intervention in order to improve the following deficits and impairments:  Abnormal gait, Decreased activity tolerance, Decreased balance, Decreased endurance, Decreased mobility, Decreased range of motion, Impaired flexibility, Prosthetic Dependency, Postural dysfunction, Decreased strength  Visit Diagnosis: Other abnormalities of gait and mobility  Unsteadiness on feet  Muscle weakness (generalized)  Stiffness of right hip, not elsewhere classified  History of falling     Problem List Patient Active Problem List   Diagnosis Date Noted  . Memory loss 11/23/2017  . Takotsubo cardiomyopathy   . Hypertensive heart disease   . Brain aneurysm 04/13/2016  . Dyslipidemia   . CAD in native artery   . PAC (premature atrial contraction) 08/14/2014  . PVC's (premature ventricular contractions) 08/14/2014  . Palpitations 11/02/2013  . Essential hypertension, benign 11/01/2013  . Peripheral vascular disease, unspecified (HCC) 11/01/2013    , PT, DPT 02/21/2018, 3:21 PM  [facility]  [address] [place], [place], [address] Phone: [phone]   Fax:  [phone]  Name: [name] MRN: [id number] Date of Birth: 25-Dec-1940

## 2018-02-21 NOTE — Telephone Encounter (Signed)
Notes recorded by Sigurd Sosapp, , RN on 02/21/2018 at 1:16 PM EDT LPMTCB 8/12 ------

## 2018-02-23 ENCOUNTER — Encounter: Payer: Self-pay | Admitting: Physical Therapy

## 2018-02-23 ENCOUNTER — Telehealth: Payer: Self-pay

## 2018-02-23 ENCOUNTER — Ambulatory Visit: Payer: Medicare Other | Admitting: Physical Therapy

## 2018-02-23 DIAGNOSIS — M25651 Stiffness of right hip, not elsewhere classified: Secondary | ICD-10-CM

## 2018-02-23 DIAGNOSIS — Z9181 History of falling: Secondary | ICD-10-CM

## 2018-02-23 DIAGNOSIS — R2689 Other abnormalities of gait and mobility: Secondary | ICD-10-CM

## 2018-02-23 DIAGNOSIS — R2681 Unsteadiness on feet: Secondary | ICD-10-CM

## 2018-02-23 DIAGNOSIS — M6281 Muscle weakness (generalized): Secondary | ICD-10-CM

## 2018-02-23 NOTE — Telephone Encounter (Signed)
Trying to reach patient and schedule Lexiscan Stress Test.  She has transportation issues so it is hard to correlate a good time.  Waiting on her return call.

## 2018-02-23 NOTE — Therapy (Signed)
[facility] [address] [place], [place], [address] Phone: [phone]   Fax:  [phone]  Physical Therapy Treatment  Patient Details  Name: [name] MRN: [id number] Date of Birth: 1941-07-03 Referring Provider: Aldean Baker, MD   Encounter Date: 02/23/2018  PT End of Session - 02/23/18 2305    Visit Number  2    Number of Visits  26    Date for PT Re-Evaluation  05/20/18    Authorization Type  [employer] Medicare & Medicaid    PT Start Time  0803    PT Stop Time  0848    PT Time Calculation (min)  45 min    Activity Tolerance  Patient tolerated treatment well    Behavior During Therapy  [facility] for tasks assessed/performed       Past Medical History:  Diagnosis Date  . CAD in native artery    a. 04/2016 NSTEMI with cath showing 25% MCx, 75% DX1 lesion and moderate LVD 35-45% out of proportion to CAD and in a pattern of Takotsubo CM (no clear stressor)  . Dyslipidemia   . GERD (gastroesophageal reflux disease)   . Hypercholesteremia   . Hypertension   . Hypertensive heart disease    initially she was thought to have a HOCM variant by cath with increased LVOT gradient but echo showed only a gradient across the LVOT and SAM and cardiac MRI did not show any late gad enhancement and only a LVOT gradient with 15mm BSH.  There was some mild SAM and mild MR.  EF normal at 76%.    [name] LVH (left ventricular hypertrophy)    a. cMRI 2018 - showing moderate Basal septal hypertrophy not classic for HOCM morphology with no delayed gadolium uptake to suggest myofibrillar disarray, + SAM with small LVOT gradient in regard to physiology and mild MR.  . NSTEMI (non-ST elevated myocardial infarction) (HCC) 04/2016   secondary to stress CM - Takotsubo CM  . PAC (premature atrial contraction)    Event monitors 10/2013 and 08/2014 showed NSR, ST, PACs, PVCs.   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 Posterior communicating artery aneurysm    followed by Dr Venetia Maxon  . PVC's (premature ventricular contractions)    Event monitors 10/2013 and 08/2014 showed NSR, ST, PACs, PVCs.   . 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 ABDOMINAL HYSTERECTOMY    . 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 CARDIAC CATHETERIZATION N/A 04/21/2016   Procedure: Left Heart Cath and Coronary Angiography;  Surgeon: Corky Crafts, MD;  Location: [facility] INVASIVE CV LAB;  Service: Cardiovascular;  Laterality: N/A;  . IR GENERIC HISTORICAL  02/13/2016   IR RADIOLOGY PERIPHERAL GUIDED IV START 02/13/2016 Berdine Dance, MD [facility]INTER[facility]  . IR GENERIC HISTORICAL  02/13/2016   IR US GUIDE VASC ACCESS LEFT 02/13/2016 Berdine Dance, MD [facility]    There were no vitals filed for this visit.  Subjective Assessment - 02/23/18 0808    Subjective  Pt went to see Trey Paula and he made adjustments to liner and socket.  Fit is improved.  Wearing prosthesis 9-10 hours/day.  Has not been checking skin at the end of the day but has not noticed any sore areas    Pertinent History  R TFA, CAD, HTN, NSTEMI, PAD,     Patient Stated Goals  She wants to learn to use new prosthesis to stand & walk better.     Currently in Pain?  No/denies                       [facility] Adult PT Treatment/Exercise - 02/23/18 0811      Transfers   Transfers  Sit to Stand;Stand to Sit    Sit to Stand  5: Supervision;4: Min assist    Sit to Stand Details (indicate cue type and reason)  Performed sit <> stand sequence training and review to various types of chairs and heights of surfaces first with RW and then with quad cane: mat > chair without arm rests > chair with arm rests > stool without back > mat with pt performing with supervision with RW for mat and chair with arms, min A for stool and low chair without  arms.  Required min A when using cane.  Difficulty engaging knee flexion to sit with stool; only required one verbal cue for foot placement when standing from mat with cane.  Then practiced sit <> stand at sink with stool to simulate use of stool in kitchen for cooking.  Problem solved best set up to allow pt to be close enough to counter (open lower cabinet and prop foot on lowest shelf).  Pt utilizes tray on RW to carry items from fridge to counter top; discussed ways to also perform when using a cane.    Stand to Sit  5: Supervision;4: Min assist      Ambulation/Gait   Ambulation/Gait  Yes    Ambulation/Gait Assistance  5: Supervision    Ambulation Distance (Feet)  100 Feet    Assistive device  Prosthesis;Rolling walker;Small based quad cane    Gait Pattern  Step-through pattern;Decreased step length - left;Decreased stance time - right;Decreased stride length;Decreased hip/knee flexion - right;Decreased weight shift to right;Antalgic;Lateral hip instability;Trunk flexed      Therapeutic Activites    Therapeutic Activities  Other Therapeutic Activities    Other Therapeutic Activities  energy conservation using stool at countertop when cooking or performing standing exercises; how to turn quad cane base around for use on L side.  Had pt return demonstrate             PT Education - 02/23/18 2304    Education Details  use of mirror for skin checks, use of stool in kitchen for energy conservation when cooking/washing dishes, sit <> stand transfers to various surfaces, problem solving kitchen set up and safety with carrying items    Person(s) Educated  Patient    Methods  Explanation;Demonstration    Comprehension  Verbalized understanding;Returned demonstration       PT Short Term Goals - 02/21/18 1515      PT SHORT TERM GOAL #1   Title  Patient sit to/from stand chairs with armrests to cane engaging prosthetic knee properly with supervision. (Target Date: 03/24/2018)    Time  1     Period  Months    Status  New    Target Date  03/24/18      PT SHORT TERM GOAL #2   Title  Patient reaches forward 5" without UE support with supervision.     Time  1    Period  Months    Status  New    Target Date  03/24/18      PT  SHORT TERM GOAL #3   Title  Patient ambulates 300' with RW & prosthesis with cues on prosthesis control.     Time  1    Period  Months    Status  New    Target Date  03/24/18        PT Long Term Goals - 02/21/18 1511      PT LONG TERM GOAL #1   Title  Patient verbalizes & demonstrates understanding of prosthetic care with new prosthesis to enable safe use. (Target Date: 07/20/2017)    Baseline        Time  3    Period  Months    Status  New    Target Date  05/20/18      PT LONG TERM GOAL #2   Title  Patient tolerates wear of new prosthesis >90% of awake hours without skin issues to enable safe use throughout her day.    Baseline        Time  3    Period  Months    Status  New    Target Date  05/20/18      PT LONG TERM GOAL #3   Title  Patient ambulates 400' with LRAD & new prosthesis modified independent for community mobility.     Baseline        Time  3    Period  Months    Status  New    Target Date  05/20/18      PT LONG TERM GOAL #4   Title  Patient negotiates ramps, curbs & stairs with LRAD & new prosthesis modified independent for community access.     Baseline        Time  3    Period  Months    Status  New    Target Date  05/20/18      PT LONG TERM GOAL #5   Title  Patient ambulates around furniture 100' with LRAD (single UE support) and new prosthesis carrying cup of water modified independent.    Baseline        Time  3    Period  Months    Status  New    Target Date  05/20/18      Additional Long Term Goals   Additional Long Term Goals  Yes      PT LONG TERM GOAL #6   Title  Berg Balance > 36/56 to indicate lower fall risk.    Baseline        Time  3    Period  Months    Status  New    Target Date  05/20/18             Plan - 02/23/18 2305    Clinical Impression Statement  Continued to focus on sequencing of safe sit <> stand and stand pivot transfers with RW and then with small based quad cane to a variety of seats (heights and with/without arm rests).  Also incorporated the use of a stool in patient's kitchen for energy conservation; will need to continue to discuss safest kitchen set up and safest AD for use in kitchen.  Will continue to address and progress towards LTG.    Rehab Potential  Good    PT Frequency  2x / week    PT Duration  Other (comment)   90 days (13 weeks)   PT Treatment/Interventions  ADLs/Self Care Home Management;DME Instruction;Gait training;Stair training;Functional mobility training;Therapeutic activities;Balance training;Therapeutic exercise;Neuromuscular re-education;Patient/family  education;Prosthetic Training;Manual techniques;Canalith Repostioning;Vestibular    PT Next Visit Plan  take in kitchen with stool at sink and problem solve how to safely retrieve items from fridge (if using cane); gait with RW on ramp; HEP balance standing, prosthetic gait with RW work on pelvic orientation    Consulted and Agree with Plan of Care  Patient       Patient will benefit from skilled therapeutic intervention in order to improve the following deficits and impairments:  Abnormal gait, Decreased activity tolerance, Decreased balance, Decreased endurance, Decreased mobility, Decreased range of motion, Impaired flexibility, Prosthetic Dependency, Postural dysfunction, Decreased strength  Visit Diagnosis: Other abnormalities of gait and mobility  Unsteadiness on feet  Muscle weakness (generalized)  Stiffness of right hip, not elsewhere classified  History of falling     Problem List Patient Active Problem List   Diagnosis Date Noted  . Memory loss 11/23/2017  . Takotsubo cardiomyopathy   . Hypertensive heart disease   . Brain aneurysm 04/13/2016  . Dyslipidemia    . CAD in native artery   . PAC (premature atrial contraction) 08/14/2014  . PVC's (premature ventricular contractions) 08/14/2014  . Palpitations 11/02/2013  . Essential hypertension, benign 11/01/2013  . Peripheral vascular disease, unspecified (HCC) 11/01/2013    Dierdre HighmanAudra F Potter, PT, DPT 02/23/18    11:10 PM    [facility] [address] [place], [place], [address] Phone: [phone]   Fax:  [phone]  Name: [name] MRN: [id number] Date of Birth: May 22, 1941

## 2018-02-23 NOTE — Telephone Encounter (Signed)
-----   Message from Traci R Turner, MD sent at 02/02/2018  1:40 PM EDT ----- Her nuclear stress test needs to be scheduled for 7/30 that is why we cancelled her appt with me 

## 2018-02-28 ENCOUNTER — Telehealth (HOSPITAL_COMMUNITY): Payer: Self-pay | Admitting: *Deleted

## 2018-02-28 ENCOUNTER — Encounter: Payer: Self-pay | Admitting: Physical Therapy

## 2018-02-28 ENCOUNTER — Ambulatory Visit: Payer: Medicare Other | Admitting: Physical Therapy

## 2018-02-28 DIAGNOSIS — R2689 Other abnormalities of gait and mobility: Secondary | ICD-10-CM

## 2018-02-28 DIAGNOSIS — R2681 Unsteadiness on feet: Secondary | ICD-10-CM

## 2018-02-28 DIAGNOSIS — M6281 Muscle weakness (generalized): Secondary | ICD-10-CM

## 2018-02-28 NOTE — Telephone Encounter (Signed)
Left message on voicemail in reference to upcoming appointment scheduled for 03/03/18. Phone number given for a call back so appointment time can be changed and  details instructions can be given. , Cindy IdlerCynthia W

## 2018-03-01 ENCOUNTER — Other Ambulatory Visit: Payer: Self-pay | Admitting: Cardiology

## 2018-03-01 DIAGNOSIS — R0602 Shortness of breath: Secondary | ICD-10-CM

## 2018-03-01 NOTE — Therapy (Signed)
[facility] [address] [place], [place], [address] Phone: [phone]   Fax:  [phone]  Physical Therapy Treatment  Patient Details  Name: [name] MRN: [id number] Date of Birth: 04/19/1941 Referring Provider: Aldean Baker, MD   Encounter Date: 02/28/2018  PT End of Session - 02/28/18 1106    Visit Number  3    Number of Visits  26    Date for PT Re-Evaluation  05/20/18    Authorization Type  UHC Medicare & Medicaid    PT Start Time  1104    PT Stop Time  1144    PT Time Calculation (min)  40 min    Equipment Utilized During Treatment  Gait belt    Activity Tolerance  Patient tolerated treatment well    Behavior During Therapy  WFL for tasks assessed/performed       Past Medical History:  Diagnosis Date  . CAD in native artery    a. 04/2016 NSTEMI with cath showing 25% MCx, 75% DX1 lesion and moderate LVD 35-45% out of proportion to CAD and in a pattern of Takotsubo CM (no clear stressor)  . Dyslipidemia   . GERD (gastroesophageal reflux disease)   . Hypercholesteremia   . Hypertension   . Hypertensive heart disease    initially she was thought to have a HOCM variant by cath with increased LVOT gradient but echo showed only a gradient across the LVOT and SAM and cardiac MRI did not show any late gad enhancement and only a LVOT gradient with 15mm BSH.  There was some mild SAM and mild MR.  EF normal at 76%.    [name] LVH (left ventricular hypertrophy)    a. cMRI 2018 - showing moderate Basal septal hypertrophy not classic for HOCM morphology with no delayed gadolium uptake to suggest myofibrillar disarray, + SAM with small LVOT gradient in regard to physiology and mild MR.  . NSTEMI (non-ST elevated myocardial infarction) (HCC) 04/2016   secondary to stress CM - Takotsubo CM  . PAC (premature atrial contraction)    Event monitors 10/2013 and 08/2014 showed NSR, ST, PACs, PVCs.   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 Posterior communicating artery aneurysm    followed by Dr Venetia Maxon  . PVC's (premature ventricular contractions)    Event monitors 10/2013 and 08/2014 showed NSR, ST, PACs, PVCs.   . 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 ABDOMINAL HYSTERECTOMY    . 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 CARDIAC CATHETERIZATION N/A 04/21/2016   Procedure: Left Heart Cath and Coronary Angiography;  Surgeon: Corky Crafts, MD;  Location: [facility] INVASIVE CV LAB;  Service: Cardiovascular;  Laterality: N/A;  . IR GENERIC HISTORICAL  02/13/2016   IR RADIOLOGY PERIPHERAL GUIDED IV START 02/13/2016 Berdine Dance, MD [facility]  . IR GENERIC HISTORICAL  02/13/2016   IR US GUIDE VASC ACCESS LEFT 02/13/2016 Berdine Dance, MD [facility]    There were no vitals filed for this visit.  Subjective Assessment - 02/28/18 1104    Subjective  No new issues. Saw Trey Paula again today- had height adjustment and he filled down the top at the thigh so it does not dig into her anymore. Goes back in a week to have cover put  on. No falls or pain to report.     Pertinent History  R TFA, CAD, HTN, NSTEMI, PAD,     Patient Stated Goals  She wants to learn to use new prosthesis to stand & walk better.     Currently in Pain?  No/denies            [facility] Adult PT Treatment/Exercise - 02/28/18 1106      Transfers   Transfers  Sit to Stand;Stand to Sit    Sit to Stand  5: Supervision;With upper extremity assist;From chair/3-in-1    Sit to Stand Details (indicate cue type and reason)  occasionally did not need UE support to stabilize with standing (on walker or cane)    Stand to Sit  5: Supervision;With upper extremity assist;To chair/3-in-1    Stand to Sit Details  demo's safe technique with UE support      Ambulation/Gait   Ambulation/Gait  Yes     Ambulation/Gait Assistance  5: Supervision    Ambulation/Gait Assistance Details  1st lap with RW working on posture, reciprocal stepping with use of prosthetic knee with gait (flexion with swing phase); 2cd lap with large base quad cane, then 3rd lap with small base quad cane for 70 feet each to compare them as pt has both at home. Pt with improved sequencing and cane placement with use of small base quad cane. 4th lap with small base quad cane with cues on posture, incr right step length, weight shifting and for unlocking/locking of prosthetic knee with gait.     Ambulation Distance (Feet)  115 Feet   x1; 70 x2, 115 x1   Assistive device  Prosthesis;Rolling walker;Small based quad cane;Large base quad cane    Gait Pattern  Step-through pattern;Decreased stride length;Narrow base of support;Trunk flexed;Decreased step length - right;Decreased stance time - right;Decreased weight shift to right;Decreased hip/knee flexion - right    Ambulation Surface  Level;Indoor    Ramp  Other (comment);4: Min assist   min guard assist   Ramp Details (indicate cue type and reason)  x3 reps with RW/prosthesis. cues on sequencing, use of prosthetic knee with descending and walker proximity     Curb  Other (comment)   min guard assist   Curb Details (indicate cue type and reason)  with RW/prosthesis, no balance loss noted. no cues needed on sequencing.       Prosthetics   Current prosthetic wear tolerance (days/week)   daily    Current prosthetic wear tolerance (#hours/day)   most awake hours out of bed (average is from 11am to 5 pm).     Residual limb condition   no issues per pt report    Education Provided  Residual limb care;Proper wear schedule/adjustment;Proper weight-bearing schedule/adjustment    Person(s) Educated  Patient    Education Method  Explanation;Demonstration;Verbal cues    Education Method  Verbalized understanding;Verbal cues required;Needs further instruction    Donning Prosthesis   Supervision    Doffing Prosthesis  Supervision               PT Short Term Goals - 02/21/18 1515      PT SHORT TERM GOAL #1   Title  Patient sit to/from stand chairs with armrests to cane engaging prosthetic knee properly with supervision. (Target Date: 03/24/2018)    Time  1    Period  Months    Status  New    Target Date  03/24/18      PT SHORT TERM  GOAL #2   Title  Patient reaches forward 5" without UE support with supervision.     Time  1    Period  Months    Status  New    Target Date  03/24/18      PT SHORT TERM GOAL #3   Title  Patient ambulates 300' with RW & prosthesis with cues on prosthesis control.     Time  1    Period  Months    Status  New    Target Date  03/24/18        PT Long Term Goals - 02/21/18 1511      PT LONG TERM GOAL #1   Title  Patient verbalizes & demonstrates understanding of prosthetic care with new prosthesis to enable safe use. (Target Date: 07/20/2017)    Baseline        Time  3    Period  Months    Status  New    Target Date  05/20/18      PT LONG TERM GOAL #2   Title  Patient tolerates wear of new prosthesis >90% of awake hours without skin issues to enable safe use throughout her day.    Baseline        Time  3    Period  Months    Status  New    Target Date  05/20/18      PT LONG TERM GOAL #3   Title  Patient ambulates 400' with LRAD & new prosthesis modified independent for community mobility.     Baseline        Time  3    Period  Months    Status  New    Target Date  05/20/18      PT LONG TERM GOAL #4   Title  Patient negotiates ramps, curbs & stairs with LRAD & new prosthesis modified independent for community access.     Baseline        Time  3    Period  Months    Status  New    Target Date  05/20/18      PT LONG TERM GOAL #5   Title  Patient ambulates around furniture 100' with LRAD (single UE support) and new prosthesis carrying cup of water modified independent.    Baseline        Time  3    Period   Months    Status  New    Target Date  05/20/18      Additional Long Term Goals   Additional Long Term Goals  Yes      PT LONG TERM GOAL #6   Title  Berg Balance > 36/56 to indicate lower fall risk.    Baseline        Time  3    Period  Months    Status  New    Target Date  05/20/18         Plan - 02/28/18 1106    Clinical Impression Statement  Today's skilled session continued to focus on use of new prosthesis with gait/barriers with RW vs cane. Pt is improving with use of new prosthetic knee with mobility with less cues needed for flexion with swing phase. Pt should benefit from continued PT to progress toward unmet goals.     Rehab Potential  Good    PT Frequency  2x / week    PT Duration  Other (comment)   90 days (  13 weeks)   PT Treatment/Interventions  ADLs/Self Care Home Management;DME Instruction;Gait training;Stair training;Functional mobility training;Therapeutic activities;Balance training;Therapeutic exercise;Neuromuscular re-education;Patient/family education;Prosthetic Training;Manual techniques;Canalith Repostioning;Vestibular    PT Next Visit Plan  continue with RW on barriers, small base quad cane with gait. begin balance activities     Consulted and Agree with Plan of Care  Patient       Patient will benefit from skilled therapeutic intervention in order to improve the following deficits and impairments:  Abnormal gait, Decreased activity tolerance, Decreased balance, Decreased endurance, Decreased mobility, Decreased range of motion, Impaired flexibility, Prosthetic Dependency, Postural dysfunction, Decreased strength  Visit Diagnosis: Other abnormalities of gait and mobility  Unsteadiness on feet  Muscle weakness (generalized)     Problem List Patient Active Problem List   Diagnosis Date Noted  . Memory loss 11/23/2017  . Takotsubo cardiomyopathy   . Hypertensive heart disease   . Brain aneurysm 04/13/2016  . Dyslipidemia   . CAD in native artery    . PAC (premature atrial contraction) 08/14/2014  . PVC's (premature ventricular contractions) 08/14/2014  . Palpitations 11/02/2013  . Essential hypertension, benign 11/01/2013  . Peripheral vascular disease, unspecified (HCC) 11/01/2013    Sallyanne KusterKathy , PTA, [facility] [address], [address] [place], [place] [address] [phone] 03/01/18, 9:33 AM   Name: [name] MRN: [id number] Date of Birth: 08/13/1940

## 2018-03-02 ENCOUNTER — Telehealth (HOSPITAL_COMMUNITY): Payer: Self-pay

## 2018-03-02 NOTE — Telephone Encounter (Signed)
Patient given detailed instructions per Myocardial Perfusion Study Information Sheet for the test on 03/03/2018 at 10:30. Patient notified to arrive 15 minutes early and that it is imperative to arrive on time for appointment to keep from having the test rescheduled.  If you need to cancel or reschedule your appointment, please call the office within 24 hours of your appointment. . Patient verbalized understanding.EHK

## 2018-03-03 ENCOUNTER — Encounter: Payer: Self-pay | Admitting: Rehabilitation

## 2018-03-03 ENCOUNTER — Ambulatory Visit: Payer: Medicare Other | Admitting: Rehabilitation

## 2018-03-03 ENCOUNTER — Encounter (HOSPITAL_COMMUNITY): Payer: Medicare Other

## 2018-03-03 ENCOUNTER — Ambulatory Visit (HOSPITAL_COMMUNITY): Payer: Medicare Other | Attending: Cardiovascular Disease

## 2018-03-03 DIAGNOSIS — R2681 Unsteadiness on feet: Secondary | ICD-10-CM

## 2018-03-03 DIAGNOSIS — R2689 Other abnormalities of gait and mobility: Secondary | ICD-10-CM

## 2018-03-03 DIAGNOSIS — M25651 Stiffness of right hip, not elsewhere classified: Secondary | ICD-10-CM

## 2018-03-03 DIAGNOSIS — R0602 Shortness of breath: Secondary | ICD-10-CM | POA: Insufficient documentation

## 2018-03-03 DIAGNOSIS — M6281 Muscle weakness (generalized): Secondary | ICD-10-CM

## 2018-03-03 LAB — MYOCARDIAL PERFUSION IMAGING
CHL CUP NUCLEAR SDS: 4
CHL CUP NUCLEAR SRS: 8
CHL CUP NUCLEAR SSS: 12
CHL CUP RESTING HR STRESS: 51 {beats}/min
LHR: 0.35
LV dias vol: 55 mL (ref 46–106)
LV sys vol: 14 mL
NUC STRESS TID: 1.43
Peak HR: 78 {beats}/min

## 2018-03-03 MED ORDER — REGADENOSON 0.4 MG/5ML IV SOLN
0.4000 mg | Freq: Once | INTRAVENOUS | Status: AC
  Administered 2018-03-03: 0.4 mg via INTRAVENOUS

## 2018-03-03 MED ORDER — TECHNETIUM TC 99M TETROFOSMIN IV KIT
30.0000 | PACK | Freq: Once | INTRAVENOUS | Status: AC | PRN
  Administered 2018-03-03: 30 via INTRAVENOUS
  Filled 2018-03-03: qty 30

## 2018-03-03 MED ORDER — TECHNETIUM TC 99M TETROFOSMIN IV KIT
10.5000 | PACK | Freq: Once | INTRAVENOUS | Status: AC | PRN
  Administered 2018-03-03: 10.5 via INTRAVENOUS
  Filled 2018-03-03: qty 11

## 2018-03-03 NOTE — Therapy (Signed)
[facility] [address] [place], [place], [address] Phone: [phone]   Fax:  [phone]  Physical Therapy Treatment  Patient Details  Name: [name] MRN: [id number] Date of Birth: Apr 20, 1941 Referring Provider: Aldean Baker, MD   Encounter Date: 03/03/2018  PT End of Session - 03/03/18 0844    Visit Number  4    Number of Visits  26    Date for PT Re-Evaluation  05/20/18    Authorization Type  [employer] Medicare & Medicaid    PT Start Time  0840    PT Stop Time  0925    PT Time Calculation (min)  45 min    Equipment Utilized During Treatment  Gait belt    Activity Tolerance  Patient tolerated treatment well    Behavior During Therapy  [facility] for tasks assessed/performed       Past Medical History:  Diagnosis Date  . CAD in native artery    a. 04/2016 NSTEMI with cath showing 25% MCx, 75% DX1 lesion and moderate LVD 35-45% out of proportion to CAD and in a pattern of Takotsubo CM (no clear stressor)  . Dyslipidemia   . GERD (gastroesophageal reflux disease)   . Hypercholesteremia   . Hypertension   . Hypertensive heart disease    initially she was thought to have a HOCM variant by cath with increased LVOT gradient but echo showed only a gradient across the LVOT and SAM and cardiac MRI did not show any late gad enhancement and only a LVOT gradient with 15mm BSH.  There was some mild SAM and mild MR.  EF normal at 76%.    [name] LVH (left ventricular hypertrophy)    a. cMRI 2018 - showing moderate Basal septal hypertrophy not classic for HOCM morphology with no delayed gadolium uptake to suggest myofibrillar disarray, + SAM with small LVOT gradient in regard to physiology and mild MR.  . NSTEMI (non-ST elevated myocardial infarction) (HCC) 04/2016   secondary to stress CM - Takotsubo CM  . PAC (premature atrial contraction)    Event monitors 10/2013 and 08/2014 showed NSR, ST, PACs, PVCs.   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 Posterior communicating artery aneurysm    followed by Dr Venetia Maxon  . PVC's (premature ventricular contractions)    Event monitors 10/2013 and 08/2014 showed NSR, ST, PACs, PVCs.   . 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 ABDOMINAL HYSTERECTOMY    . 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 CARDIAC CATHETERIZATION N/A 04/21/2016   Procedure: Left Heart Cath and Coronary Angiography;  Surgeon: Corky Crafts, MD;  Location: [facility] INVASIVE CV LAB;  Service: Cardiovascular;  Laterality: N/A;  . IR GENERIC HISTORICAL  02/13/2016   IR RADIOLOGY PERIPHERAL GUIDED IV START 02/13/2016 Berdine Dance, MD [facility]-INTERV RAD  . IR GENERIC HISTORICAL  02/13/2016   IR US GUIDE VASC ACCESS LEFT 02/13/2016 Berdine Dance, MD [facility]INTER[facility]    There were no vitals filed for this visit.  Subjective Assessment - 03/03/18 0844    Subjective  Pt reports no changes since visit.      Pertinent History  R TFA, CAD, HTN, NSTEMI, PAD,     Patient Stated Goals  She wants to learn to use new prosthesis  to stand & walk better.     Currently in Pain?  No/denies                       [facility] - 03/03/18 0856      Transfers   Transfers  Sit to Stand;Stand to Sit    Sit to Stand  6: Modified independent (Device/Increase time);5: Supervision    Sit to Stand Details (indicate cue type and reason)  Min cues for technique    Stand to Sit  5: Supervision;With upper extremity assist;To chair/3-in-1      Ambulation/Gait   Ambulation/Gait  Yes    Ambulation/Gait Assistance  5: Supervision;4: Min assist    Ambulation/Gait Assistance Details  S with use of RW x 100' with cues for R forward hip protraction during end of stance phase in order to unlock knee for forward progression.  Continue to provide support  for forward weight shift onto RLE during stnace and upright posture.  Also continued with gait training with use of SBQC during session.  Pt much more hesitant with use of cane and PT intermittently providing R HHA for safety with intermittent facilitation for forward/lateral weight shift onto RLE in stance and again, continued forward movement of hips to unlock R knee for forward progression of RLE without hip hike and circumduction.      Ambulation Distance (Feet)  100 Feet   115' x 1, 85', another 70-80' x 2 between exercises   Assistive device  Rolling walker;Small based quad cane;Prosthesis    Gait Pattern  Step-through pattern;Decreased stride length;Narrow base of support;Trunk flexed;Decreased step length - right;Decreased stance time - right;Decreased weight shift to right;Decreased hip/knee flexion - right    Ambulation Surface  Level;Indoor    Gait Comments  Worked in // bars and then at counter top on getting comfortable with both loading RLE in stance and also improving ability to unlock R knee at latter part of stance phase of gait in preparation for swing phase.  Performed with BUE support>single UE support with heavy facilitation from therapist initially however progressing to verbal cues only.  In // bars had pt ambulate and at counter top had pt advance RLE forward, load, then retro step RLE and load.  Verbally added this to HEP but could benefit from continued practice.              PT Education - 03/03/18 0844    Education Details  Forward stepping at counter top with support from cane to improve ability to flex R prosthetic knee during swing.     Person(s) Educated  Patient    Methods  Explanation    Comprehension  Verbalized understanding       PT Short Term Goals - 02/21/18 1515      PT SHORT TERM GOAL #1   Title  Patient sit to/from stand chairs with armrests to cane engaging prosthetic knee properly with supervision. (Target Date: 03/24/2018)    Time  1    Period   Months    Status  New    Target Date  03/24/18      PT SHORT TERM GOAL #2   Title  Patient reaches forward 5" without UE support with supervision.     Time  1    Period  Months    Status  New    Target Date  03/24/18      PT SHORT TERM GOAL #3   Title  Patient ambulates 300' with RW & prosthesis with cues on prosthesis control.     Time  1    Period  Months    Status  New    Target Date  03/24/18        PT Long Term Goals - 02/21/18 1511      PT LONG TERM GOAL #1   Title  Patient verbalizes & demonstrates understanding of prosthetic care with new prosthesis to enable safe use. (Target Date: 07/20/2017)    Baseline        Time  3    Period  Months    Status  New    Target Date  05/20/18      PT LONG TERM GOAL #2   Title  Patient tolerates wear of new prosthesis >90% of awake hours without skin issues to enable safe use throughout her day.    Baseline        Time  3    Period  Months    Status  New    Target Date  05/20/18      PT LONG TERM GOAL #3   Title  Patient ambulates 400' with LRAD & new prosthesis modified independent for community mobility.     Baseline        Time  3    Period  Months    Status  New    Target Date  05/20/18      PT LONG TERM GOAL #4   Title  Patient negotiates ramps, curbs & stairs with LRAD & new prosthesis modified independent for community access.     Baseline        Time  3    Period  Months    Status  New    Target Date  05/20/18      PT LONG TERM GOAL #5   Title  Patient ambulates around furniture 100' with LRAD (single UE support) and new prosthesis carrying cup of water modified independent.    Baseline        Time  3    Period  Months    Status  New    Target Date  05/20/18      Additional Long Term Goals   Additional Long Term Goals  Yes      PT LONG TERM GOAL #6   Title  Berg Balance > 36/56 to indicate lower fall risk.    Baseline        Time  3    Period  Months    Status  New    Target Date  05/20/18             Plan - 03/03/18 1231    Clinical Impression Statement  Skilled session focused on prosthetic gait training with use of RW and also with SBQC.  Note that she does well within session with exercises but is more apprehensive to flex knee during gait, esp with cane.  Will benefit from continued practice to improve safety and gait quality.      Rehab Potential  Good    PT Frequency  2x / week    PT Duration  Other (comment)   90 days (13 weeks)   PT Treatment/Interventions  ADLs/Self Care Home Management;DME Instruction;Gait training;Stair training;Functional mobility training;Therapeutic activities;Balance training;Therapeutic exercise;Neuromuscular re-education;Patient/family education;Prosthetic Training;Manual techniques;Canalith Repostioning;Vestibular    PT Next Visit Plan  continue with RW on barriers, small base quad cane with gait. begin balance activities, knee flex during swing-continue  to go over at counter top since she is to do this at home.     Consulted and Agree with Plan of Care  Patient       Patient will benefit from skilled therapeutic intervention in order to improve the following deficits and impairments:  Abnormal gait, Decreased activity tolerance, Decreased balance, Decreased endurance, Decreased mobility, Decreased range of motion, Impaired flexibility, Prosthetic Dependency, Postural dysfunction, Decreased strength  Visit Diagnosis: Other abnormalities of gait and mobility  Unsteadiness on feet  Muscle weakness (generalized)  Stiffness of right hip, not elsewhere classified     Problem List Patient Active Problem List   Diagnosis Date Noted  . Memory loss 11/23/2017  . Takotsubo cardiomyopathy   . Hypertensive heart disease   . Brain aneurysm 04/13/2016  . Dyslipidemia   . CAD in native artery   . PAC (premature atrial contraction) 08/14/2014  . PVC's (premature ventricular contractions) 08/14/2014  . Palpitations 11/02/2013  . Essential  hypertension, benign 11/01/2013  . Peripheral vascular disease, unspecified (HCC) 11/01/2013    Harriet Butte, PT, MPT [facility] [address] [place], [place], [address] Phone: [phone]   Fax:  [phone] 03/03/18, 12:34 PM  Name: [name] MRN: [id number] Date of Birth: Apr 30, 1941

## 2018-03-07 ENCOUNTER — Ambulatory Visit: Payer: Medicare Other | Admitting: Physical Therapy

## 2018-03-07 DIAGNOSIS — R2681 Unsteadiness on feet: Secondary | ICD-10-CM

## 2018-03-07 DIAGNOSIS — Z9181 History of falling: Secondary | ICD-10-CM

## 2018-03-07 DIAGNOSIS — R2689 Other abnormalities of gait and mobility: Secondary | ICD-10-CM

## 2018-03-07 DIAGNOSIS — M6281 Muscle weakness (generalized): Secondary | ICD-10-CM

## 2018-03-07 DIAGNOSIS — M25651 Stiffness of right hip, not elsewhere classified: Secondary | ICD-10-CM

## 2018-03-08 NOTE — Therapy (Signed)
[facility] [address] [place], [place], [address] Phone: [phone]   Fax:  [phone]  Physical Therapy Treatment  Patient Details  Name: [name] MRN: [id number] Date of Birth: 05-04-1941 Referring Provider: Aldean BakerMarcus Duda, MD   Encounter Date: 03/07/2018  PT End of Session - 03/08/18 1111    Visit Number  5    Number of Visits  26    Date for PT Re-Evaluation  05/20/18    Authorization Type  [employer]HC Medicare & Medicaid    PT Start Time  0803    PT Stop Time  0846    PT Time Calculation (min)  43 min    Activity Tolerance  Patient tolerated treatment well    Behavior During Therapy  [facility] for tasks assessed/performed       Past Medical History:  Diagnosis Date  . CAD in native artery    a. 04/2016 NSTEMI with cath showing 25% MCx, 75% DX1 lesion and moderate LVD 35-45% out of proportion to CAD and in a pattern of Takotsubo CM (no clear stressor)  . Dyslipidemia   . GERD (gastroesophageal reflux disease)   . Hypercholesteremia   . Hypertension   . Hypertensive heart disease    initially she was thought to have a HOCM variant by cath with increased LVOT gradient but echo showed only a 10mmHg gradient across the LVOT and SAM and cardiac MRI did not show any late gad enhancement and only a 5mmHg LVOT gradient with 15mm BSH.  There was some mild SAM and mild MR.  EF normal at 76%.    [name]. LVH (left ventricular hypertrophy)    a. cMRI 2018 - showing moderate Basal septal hypertrophy not classic for HOCM morphology with no delayed gadolium uptake to suggest myofibrillar disarray, + SAM with small LVOT gradient in regard to physiology and mild MR.  . NSTEMI (non-ST elevated myocardial infarction) (HCC) 04/2016   secondary to stress CM - Takotsubo CM  . PAC (premature atrial contraction)    Event monitors 10/2013 and 08/2014 showed NSR, ST, PACs, PVCs.   [name]. 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 Posterior communicating artery aneurysm    followed by Dr Venetia MaxonStern  . PVC's (premature ventricular contractions)    Event monitors 10/2013 and 08/2014 showed NSR, ST, PACs, PVCs.   . 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 ABDOMINAL HYSTERECTOMY    . 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 CARDIAC CATHETERIZATION N/A 04/21/2016   Procedure: Left Heart Cath and Coronary Angiography;  Surgeon: Corky CraftsJayadeep S Varanasi, MD;  [facility];  Service: Cardiovascular;  Laterality: N/A;  . IR GENERIC HISTORICAL  02/13/2016   IR RADIOLOGY PERIPHERAL GUIDED IV START 02/13/2016 Berdine DanceMichael Shick, MD [facility]INTER[facility]  . IR GENERIC HISTORICAL  02/13/2016   IR US GUIDE VASC ACCESS LEFT 02/13/2016 Berdine DanceMichael Shick, MD [facility]    There were no vitals filed for this visit.  Subjective Assessment - 03/07/18 0807    Subjective  Pt doing well, no falls or near falls.  Still having a hard time performing sink exercises and step throughs.  Still having a hard time balancing without UE support.    Pertinent History  R TFA, CAD, HTN, NSTEMI, PAD,  Patient Stated Goals  She wants to learn to use new prosthesis to stand & walk better.     Currently in Pain?  No/denies                       [facility] Adult PT Treatment/Exercise - 03/07/18 0834      Transfers   Transfers  Sit to Stand;Stand to Sit    Sit to Stand  6: Modified independent (Device/Increase time);With upper extremity assist    Sit to Stand Details (indicate cue type and reason)  no cues required for sit <> stand sequencing and unlocking R knee    Stand to Sit  6: Modified independent (Device/Increase time)      Ambulation/Gait   Pre-Gait Activities  Standing with RW in staggered stance L foot forwards reaching LUE forwards and to the L to facilitate hip extension, trunk elongation and forward  weight shift over LLE.  Pt demonstrating increased trunk flexion to reach.  Transitioned to placing target at R pelvis and shifting forwards to push yard stick forwards with pelvis with improved hip extension.  Peformed first with bilat UE support and then LUE support only.  Again when pt transitioned to one UE support she returned to compensation of trunk flexion and rotation.      Gait Comments  At countertop reviewed step through home exercise: at home pt was holding counter top on R side but no cane on L side (Counter on prosthetic side); added cane in LUE and reviewed step through sequencing with weight shifting with bilat UE support.  After multiple repetitions pt turned around and transitioned to step throughs and weight shifting with counter on L side and no UE support in RUE to simulate ambulating with cane.  Also performed ambulation down countertop with LUE support and therapist on R.  Without bilat UE support pt demonstrates significant difficulty with anterior and L lateral weight shifting due to fear of unlocking R knee and falling and compensates with head and shoulder lean to L but keeps pelvis retracted even with max verbal and tactile cues at pelvis and ribs.               PT Education - 03/08/18 1111    Education Details  continued to review stepping exercise at counter with cane     Person(s) Educated  Patient    Methods  Explanation;Demonstration    Comprehension  Need further instruction       PT Short Term Goals - 02/21/18 1515      PT SHORT TERM GOAL #1   Title  Patient sit to/from stand chairs with armrests to cane engaging prosthetic knee properly with supervision. (Target Date: 03/24/2018)    Time  1    Period  Months    Status  New    Target Date  03/24/18      PT SHORT TERM GOAL #2   Title  Patient reaches forward 5" without UE support with supervision.     Time  1    Period  Months    Status  New    Target Date  03/24/18      PT SHORT TERM GOAL #3    Title  Patient ambulates 300' with RW & prosthesis with cues on prosthesis control.     Time  1    Period  Months    Status  New    Target Date  03/24/18  PT Long Term Goals - 02/21/18 1511      PT LONG TERM GOAL #1   Title  Patient verbalizes & demonstrates understanding of prosthetic care with new prosthesis to enable safe use. (Target Date: 07/20/2017)    Baseline        Time  3    Period  Months    Status  New    Target Date  05/20/18      PT LONG TERM GOAL #2   Title  Patient tolerates wear of new prosthesis >90% of awake hours without skin issues to enable safe use throughout her day.    Baseline        Time  3    Period  Months    Status  New    Target Date  05/20/18      PT LONG TERM GOAL #3   Title  Patient ambulates 400' with LRAD & new prosthesis modified independent for community mobility.     Baseline        Time  3    Period  Months    Status  New    Target Date  05/20/18      PT LONG TERM GOAL #4   Title  Patient negotiates ramps, curbs & stairs with LRAD & new prosthesis modified independent for community access.     Baseline        Time  3    Period  Months    Status  New    Target Date  05/20/18      PT LONG TERM GOAL #5   Title  Patient ambulates around furniture 100' with LRAD (single UE support) and new prosthesis carrying cup of water modified independent.    Baseline        Time  3    Period  Months    Status  New    Target Date  05/20/18      Additional Long Term Goals   Additional Long Term Goals  Yes      PT LONG TERM GOAL #6   Title  Berg Balance > 36/56 to indicate lower fall risk.    Baseline        Time  3    Period  Months    Status  New    Target Date  05/20/18            Plan - 03/08/18 1112    Clinical Impression Statement  Treatment session focused on review of step through home exercise and ongoing pre-gait and balance training for full anterior-lateral weight shifting and sequencing with new prosthesis to  continue to progress towards pt's goal of ambulating with a cane.  Will continue to address in order to progress towards LTG.    Rehab Potential  Good    PT Frequency  2x / week    PT Duration  Other (comment)   90 days (13 weeks)   PT Treatment/Interventions  ADLs/Self Care Home Management;DME Instruction;Gait training;Stair training;Functional mobility training;Therapeutic activities;Balance training;Therapeutic exercise;Neuromuscular re-education;Patient/family education;Prosthetic Training;Manual techniques;Canalith Repostioning;Vestibular    PT Next Visit Plan  Still needs to work on step through gait sequence at counter top progressing to one UE support.  Any balance activities to work on patient peforming full anterior/L lateral weight shift, L hip extension to safely unlock R knee and swing through (pt tends to flex trunk without shifting weight fully forwards when holding with one UE).  Gait train with quad cane; ramps.  Consulted and Agree with Plan of Care  Patient       Patient will benefit from skilled therapeutic intervention in order to improve the following deficits and impairments:  Abnormal gait, Decreased activity tolerance, Decreased balance, Decreased endurance, Decreased mobility, Decreased range of motion, Impaired flexibility, Prosthetic Dependency, Postural dysfunction, Decreased strength  Visit Diagnosis: Other abnormalities of gait and mobility  Unsteadiness on feet  Muscle weakness (generalized)  Stiffness of right hip, not elsewhere classified  History of falling     Problem List Patient Active Problem List   Diagnosis Date Noted  . Memory loss 11/23/2017  . Takotsubo cardiomyopathy   . Hypertensive heart disease   . Brain aneurysm 04/13/2016  . Dyslipidemia   . CAD in native artery   . PAC (premature atrial contraction) 08/14/2014  . PVC's (premature ventricular contractions) 08/14/2014  . Palpitations 11/02/2013  . Essential hypertension, benign  11/01/2013  . Peripheral vascular disease, unspecified (HCC) 11/01/2013    Dierdre Highman, PT, DPT 03/08/18    11:28 AM    [facility] [address] [place], [place], [address] Phone: [phone]   Fax:  [phone]  Name: [name] MRN: [id number] Date of Birth: 06/19/1941

## 2018-03-09 ENCOUNTER — Encounter: Payer: Self-pay | Admitting: Physical Therapy

## 2018-03-09 ENCOUNTER — Ambulatory Visit: Payer: Medicare Other | Admitting: Physical Therapy

## 2018-03-09 DIAGNOSIS — M6281 Muscle weakness (generalized): Secondary | ICD-10-CM

## 2018-03-09 DIAGNOSIS — Z9181 History of falling: Secondary | ICD-10-CM

## 2018-03-09 DIAGNOSIS — R2689 Other abnormalities of gait and mobility: Secondary | ICD-10-CM

## 2018-03-09 DIAGNOSIS — R2681 Unsteadiness on feet: Secondary | ICD-10-CM

## 2018-03-09 NOTE — Therapy (Signed)
[facility] [address] [place], [place], [address] Phone: [phone]   Fax:  [phone]  Physical Therapy Treatment  Patient Details  Name: [name] MRN: [id number] Date of Birth: July 13, 1941 Referring Provider: Aldean Baker, MD   Encounter Date: 03/09/2018  PT End of Session - 03/09/18 0932    Visit Number  6    Number of Visits  26    Date for PT Re-Evaluation  05/20/18    Authorization Type  [employer] Medicare & Medicaid    PT Start Time  0850    PT Stop Time  0929    PT Time Calculation (min)  39 min    Activity Tolerance  Patient tolerated treatment well    Behavior During Therapy  [facility] for tasks assessed/performed       Past Medical History:  Diagnosis Date  . CAD in native artery    a. 04/2016 NSTEMI with cath showing 25% MCx, 75% DX1 lesion and moderate LVD 35-45% out of proportion to CAD and in a pattern of Takotsubo CM (no clear stressor)  . Dyslipidemia   . GERD (gastroesophageal reflux disease)   . Hypercholesteremia   . Hypertension   . Hypertensive heart disease    initially she was thought to have a HOCM variant by cath with increased LVOT gradient but echo showed only a gradient across the LVOT and SAM and cardiac MRI did not show any late gad enhancement and only a LVOT gradient with 15mm BSH.  There was some mild SAM and mild MR.  EF normal at 76%.    [name] LVH (left ventricular hypertrophy)    a. cMRI 2018 - showing moderate Basal septal hypertrophy not classic for HOCM morphology with no delayed gadolium uptake to suggest myofibrillar disarray, + SAM with small LVOT gradient in regard to physiology and mild MR.  . NSTEMI (non-ST elevated myocardial infarction) (HCC) 04/2016   secondary to stress CM - Takotsubo CM  . PAC (premature atrial contraction)    Event monitors 10/2013 and 08/2014 showed NSR, ST, PACs, PVCs.   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 Posterior communicating artery aneurysm    followed by Dr Venetia Maxon  . PVC's (premature ventricular contractions)    Event monitors 10/2013 and 08/2014 showed NSR, ST, PACs, PVCs.   . 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 ABDOMINAL HYSTERECTOMY    . 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 CARDIAC CATHETERIZATION N/A 04/21/2016   Procedure: Left Heart Cath and Coronary Angiography;  Surgeon: Corky Crafts, MD;  Location: [facility] INVASIVE CV LAB;  Service: Cardiovascular;  Laterality: N/A;  . IR GENERIC HISTORICAL  02/13/2016   IR RADIOLOGY PERIPHERAL GUIDED IV START 02/13/2016 Berdine Dance, MD [facility]INTER[facility]  . IR GENERIC HISTORICAL  02/13/2016   IR US GUIDE VASC ACCESS LEFT 02/13/2016 Berdine Dance, MD [facility]    There were no vitals filed for this visit.  Subjective Assessment - 03/09/18 0853    Subjective  Pt worked on HEP at home and walked about a 1/2 mi with friend in parking lot.    Pertinent History  R TFA, CAD, HTN, NSTEMI, PAD,     Patient Stated Goals  She wants to learn to use new prosthesis to  stand & walk better.     Currently in Pain?  No/denies                       [facility] Treatment/Exercise - 03/09/18 0001      Ambulation/Gait   Ambulation/Gait  Yes    Ambulation/Gait Assistance  5: Supervision    Ambulation/Gait Assistance Details  Manual and verbal cues for weightshifting, and step width    Ambulation Distance (Feet)  300 Feet + 115   Assistive device  Rolling walker    Gait Pattern  Step-through pattern;Decreased stride length;Narrow base of support;Trunk flexed;Decreased step length - right;Decreased stance time - right;Decreased weight shift to right;Decreased hip/knee flexion - right    Ambulation Surface  Level;Indoor    Ramp  5: Supervision    Ramp Details (indicate cue type and reason)  RW,  cues for weight shifting onto R LE    Curb  5: Supervision    Curb Details (indicate cue type and reason)  RW          Balance Exercises - 03/09/18 1133      Balance Exercises: Standing   Standing Eyes Opened  Narrow base of support (BOS);Wide (BOA)   + head nods + upper trunk rotations cues for maintaining weight on prosthesis, attempting without UE support.         PT Short Term Goals - 02/21/18 1515      PT SHORT TERM GOAL #1   Title  Patient sit to/from stand chairs with armrests to cane engaging prosthetic knee properly with supervision. (Target Date: 03/24/2018)    Time  1    Period  Months    Status  New    Target Date  03/24/18      PT SHORT TERM GOAL #2   Title  Patient reaches forward 5" without UE support with supervision.     Time  1    Period  Months    Status  New    Target Date  03/24/18      PT SHORT TERM GOAL #3   Title  Patient ambulates 300' with RW & prosthesis with cues on prosthesis control.     Time  1    Period  Months    Status  New    Target Date  03/24/18        PT Long Term Goals - 02/21/18 1511      PT LONG TERM GOAL #1   Title  Patient verbalizes & demonstrates understanding of prosthetic care with new prosthesis to enable safe use. (Target Date: 07/20/2017)    Baseline        Time  3    Period  Months    Status  New    Target Date  05/20/18      PT LONG TERM GOAL #2   Title  Patient tolerates wear of new prosthesis >90% of awake hours without skin issues to enable safe use throughout her day.    Baseline        Time  3    Period  Months    Status  New    Target Date  05/20/18      PT LONG TERM GOAL #3   Title  Patient ambulates 400' with LRAD & new prosthesis modified independent for community mobility.     Baseline        Time  3    Period  Months  Status  New    Target Date  05/20/18      PT LONG TERM GOAL #4   Title  Patient negotiates ramps, curbs & stairs with LRAD & new prosthesis modified independent for  community access.     Baseline        Time  3    Period  Months    Status  New    Target Date  05/20/18      PT LONG TERM GOAL #5   Title  Patient ambulates around furniture 100' with LRAD (single UE support) and new prosthesis carrying cup of water modified independent.    Baseline        Time  3    Period  Months    Status  New    Target Date  05/20/18      Additional Long Term Goals   Additional Long Term Goals  Yes      PT LONG TERM GOAL #6   Title  Berg Balance > 36/56 to indicate lower fall risk.    Baseline        Time  3    Period  Months    Status  New    Target Date  05/20/18            Plan - 03/09/18 1126    Clinical Impression Statement  Pt is making progress with gait training with greater speed, tolerance to increased distance, and awareness and carryover (with cues) for weight shifting over prosthesis when in stance phase, keeping appropriate step width, and step length with RW at supervison level.   Pt continues to need to work on standing balance which was addressed today with pt standing with narrow BOS with head turns/nods  and upper trunk rotation attempting no UE support; cues required to maintain increased weight on prosthesis.                                                                                                                                                         Rehab Potential  Good    PT Frequency  2x / week    PT Duration  Other (comment)   90 days (13 weeks)   PT Treatment/Interventions  ADLs/Self Care Home Management;DME Instruction;Gait training;Stair training;Functional mobility training;Therapeutic activities;Balance training;Therapeutic exercise;Neuromuscular re-education;Patient/family education;Prosthetic Training;Manual techniques;Canalith Repostioning;Vestibular    PT Next Visit Plan  Still needs to work on step through gait sequence at counter top progressing to one UE support.  Any balance activities to work on patient  peforming full anterior/L lateral weight shift, L hip extension to safely unlock R knee and swing through (pt tends to flex trunk without shifting weight fully forwards when holding with one UE).  Gait train with quad cane; ramps.    Consulted and Agree with Plan  of Care  Patient       Patient will benefit from skilled therapeutic intervention in order to improve the following deficits and impairments:  Abnormal gait, Decreased activity tolerance, Decreased balance, Decreased endurance, Decreased mobility, Decreased range of motion, Impaired flexibility, Prosthetic Dependency, Postural dysfunction, Decreased strength  Visit Diagnosis: Other abnormalities of gait and mobility  Unsteadiness on feet  Muscle weakness (generalized)  History of falling     Problem List Patient Active Problem List   Diagnosis Date Noted  . Memory loss 11/23/2017  . Takotsubo cardiomyopathy   . Hypertensive heart disease   . Brain aneurysm 04/13/2016  . Dyslipidemia   . CAD in native artery   . PAC (premature atrial contraction) 08/14/2014  . PVC's (premature ventricular contractions) 08/14/2014  . Palpitations 11/02/2013  . Essential hypertension, benign 11/01/2013  . Peripheral vascular disease, unspecified (HCC) 11/01/2013   Hortencia Conradi, PTA  03/09/18, 11:38 AM [facility] [address] [place], [place], [address] Phone: [phone]   Fax:  [phone]  Name: ASHARI LLEWELLYN MRN: [id number] Date of Birth: [birth year]

## 2018-03-11 ENCOUNTER — Telehealth: Payer: Self-pay

## 2018-03-11 NOTE — Telephone Encounter (Signed)
Notes recorded by Sigurd Sosapp, , RN on 03/11/2018 at 12:16 PM EDT Left message with the patient's son to have patient call us because the patient's Mailbox is now full.. ------

## 2018-03-11 NOTE — Telephone Encounter (Signed)
-----   Message from Traci R Turner, MD sent at 03/10/2018 10:15 PM EDT ----- Please have her stop Imdur due to SAM and prior LVOT gradient and start amlodipine 2.5mg daily for CP (? Small vessel dz) and see extender back in 2 weeks.  Uptitrate amlodipine as BP tolerates. May need to consider Ranexa. 

## 2018-03-11 NOTE — Telephone Encounter (Signed)
Notes recorded by Sigurd Sosapp, , RN on 03/11/2018 at 8:17 AM EDT [email or web address] 8/30

## 2018-03-11 NOTE — Telephone Encounter (Signed)
-----   Message from Quintella Reichertraci R Turner, MD sent at 03/10/2018 10:15 PM EDT ----- Please have her [facility] Im[facility]ur [facility] and prior LVOT gradient and start amlodipine 2.5mg  daily for CP (? Small vessel dz) and see extender back in 2 weeks.  Uptitrate amlodipine as BP tolerates. May need to consider Ranexa.

## 2018-03-15 ENCOUNTER — Encounter: Payer: Self-pay | Admitting: Physical Therapy

## 2018-03-15 ENCOUNTER — Ambulatory Visit: Payer: Medicare Other | Attending: Orthopedic Surgery | Admitting: Physical Therapy

## 2018-03-15 ENCOUNTER — Telehealth: Payer: Self-pay

## 2018-03-15 ENCOUNTER — Other Ambulatory Visit: Payer: Self-pay

## 2018-03-15 DIAGNOSIS — Z9181 History of falling: Secondary | ICD-10-CM | POA: Insufficient documentation

## 2018-03-15 DIAGNOSIS — R2681 Unsteadiness on feet: Secondary | ICD-10-CM

## 2018-03-15 DIAGNOSIS — R2689 Other abnormalities of gait and mobility: Secondary | ICD-10-CM | POA: Diagnosis not present

## 2018-03-15 DIAGNOSIS — M6281 Muscle weakness (generalized): Secondary | ICD-10-CM | POA: Insufficient documentation

## 2018-03-15 DIAGNOSIS — M25651 Stiffness of right hip, not elsewhere classified: Secondary | ICD-10-CM | POA: Insufficient documentation

## 2018-03-15 MED ORDER — AMLODIPINE BESYLATE 2.5 MG PO TABS: 2.5000 mg | ORAL_TABLET | Freq: Every day | ORAL | 3 refills | Status: DC

## 2018-03-15 NOTE — Telephone Encounter (Signed)
-----   Message from Traci R Turner, MD sent at 03/10/2018 10:15 PM EDT ----- Please have her stop Imdur due to SAM and prior LVOT gradient and start amlodipine 2.5mg daily for CP (? Small vessel dz) and see extender back in 2 weeks.  Uptitrate amlodipine as BP tolerates. May need to consider Ranexa. 

## 2018-03-15 NOTE — Telephone Encounter (Signed)
-----   Message from Quintella Reichert, MD sent at 03/10/2018 10:15 PM EDT ----- Please have her stop Imdur due to [facility] and prior LVOT gradient and start amlodipine 2.5mg  daily for CP (? Small vessel dz) and see extender back in 2 weeks.  Uptitrate amlodipine as BP tolerates. May need to consider Ranexa.

## 2018-03-15 NOTE — Telephone Encounter (Signed)
Notes recorded by Sigurd Sos, RN on 03/15/2018 at 8:10 AM EDT Left patient and her son message to call. 9/3 ------

## 2018-03-15 NOTE — Telephone Encounter (Signed)
Notes recorded by Sigurd Sos, RN on 03/15/2018 at 3:18 PM EDT Spoke with patient and gave her Dr Norris Cross recommendations. She is complying with the medication change, but cannot arrange an appt at this time. She has to arrange transportation and then call us with available dates.

## 2018-03-16 NOTE — Therapy (Signed)
[facility] [address] [place], [place], [address] Phone: [phone]   Fax:  [phone]  Physical Therapy Treatment  Patient Details  Name: [name] MRN: [id number] Date of Birth: [birth year] Referring Provider: Aldean Baker, MD   Encounter Date: 03/15/2018  PT End of Session - 03/15/18 1328    Visit Number  7    Number of Visits  26    Date for PT Re-Evaluation  05/20/18    Authorization Type  [employer]HC Medicare & Medicaid    PT Start Time  1323   pt late for appt today   PT Stop Time  1402    PT Time Calculation (min)  39 min    Equipment Utilized During Treatment  Gait belt    Activity Tolerance  Patient tolerated treatment well    Behavior During Therapy  WFL for tasks assessed/performed       Past Medical History:  Diagnosis Date  . CAD in native artery    a. 04/2016 NSTEMI with cath showing 25% MCx, 75% DX1 lesion and moderate LVD 35-45% out of proportion to CAD and in a pattern of Takotsubo CM (no clear stressor)  . Dyslipidemia   . GERD (gastroesophageal reflux disease)   . Hypercholesteremia   . Hypertension   . Hypertensive heart disease    initially she was thought to have a HOCM variant by cath with increased LVOT gradient but echo showed only a gradient across the LVOT and SAM and cardiac MRI did not show any late gad enhancement and only a LVOT gradient with 15mm BSH.  There was some mild SAM and mild MR.  EF normal at 76%.    [name] LVH (left ventricular hypertrophy)    a. cMRI 2018 - showing moderate Basal septal hypertrophy not classic for HOCM morphology with no delayed gadolium uptake to suggest myofibrillar disarray, + SAM with small LVOT gradient in regard to physiology and mild MR.  . NSTEMI (non-ST elevated myocardial infarction) (HCC) 04/2016   secondary to stress CM - Takotsubo CM  . PAC (premature atrial contraction)    Event monitors 10/2013 and 08/2014 showed NSR, ST, PACs,  PVCs.   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 Posterior communicating artery aneurysm    followed by Dr Venetia Maxon  . PVC's (premature ventricular contractions)    Event monitors 10/2013 and 08/2014 showed NSR, ST, PACs, PVCs.   . 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 ABDOMINAL HYSTERECTOMY    . 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 CARDIAC CATHETERIZATION N/A 04/21/2016   Procedure: Left Heart Cath and Coronary Angiography;  Surgeon: Corky Crafts, MD;  Location: [facility] INVASIVE CV LAB;  Service: Cardiovascular;  Laterality: N/A;  . IR GENERIC HISTORICAL  02/13/2016   IR RADIOLOGY PERIPHERAL GUIDED IV START 02/13/2016 Berdine Dance, MD [facility]  . IR GENERIC HISTORICAL  02/13/2016   IR US GUIDE VASC ACCESS LEFT 02/13/2016 Berdine Dance, MD [facility]    There were no vitals filed for this visit.  Subjective Assessment - 03/15/18 1327    Subjective  No new complaints. Did have a rough weekend due to being sick after getting her flu and shingles shots. Better today. No falls.     Pertinent History  R TFA, CAD, HTN, NSTEMI, PAD,     Patient Stated Goals  She wants to learn to use new prosthesis to stand & walk better.     Currently in Pain?  No/denies            [facility] Adult PT Treatment/Exercise - 03/15/18 1330      Transfers   Transfers  Sit to Stand;Stand to Sit    Sit to Stand  6: Modified independent (Device/Increase time);With upper extremity assist    Stand to Sit  6: Modified independent (Device/Increase time)      Ambulation/Gait   Ambulation/Gait  Yes    Ambulation/Gait Assistance  5: Supervision    Ambulation/Gait Assistance Details  verbal/tactile cues for weight shifting onto prosthesis in stance and into toe off to allow for knee flexion of prosthesis.  this improved with repitition/distance             Ambulation Distance (Feet)  230 Feet   x1, 200 x1   Assistive device  Rolling walker;Prosthesis    Gait Pattern  Step-through pattern;Decreased stride length;Narrow base of support;Trunk flexed;Decreased step length - right;Decreased stance time - right;Decreased weight shift to right;Decreased hip/knee flexion - right    Ambulation Surface  Level;Indoor;Unlevel;Outdoor;Paved;Gravel;Grass    Curb  5: Supervision    Curb Details (indicate cue type and reason)  with RW/prosthesis with outdoor curb.       Knee/Hip Exercises: Aerobic   Nustep  UE/LE's for 8 minutes on level 2 with goal >/= 50 steps per minute for strenghtening, activity tolerance and to promote increased prosthetic weight bearing. Pt educated on set up to prevent prosthetic knee from locking out as she plans to resume working out at [facility]   Current prosthetic wear tolerance (days/week)   daily    Current prosthetic wear tolerance (#hours/day)   most awake hours out of bed (average is from 11am to 5 pm).     Residual limb condition   no issues per pt report    Donning Prosthesis  Supervision    Doffing Prosthesis  Supervision               PT Short Term Goals - 02/21/18 1515      PT SHORT TERM GOAL #1   Title  Patient sit to/from stand chairs with armrests to cane engaging prosthetic knee properly with supervision. (Target Date: 03/24/2018)    Time  1    Period  Months    Status  New    Target Date  03/24/18      PT SHORT TERM GOAL #2   Title  Patient reaches forward 5" without UE support with supervision.     Time  1    Period  Months    Status  New    Target Date  03/24/18      PT SHORT TERM GOAL #3   Title  Patient ambulates 300' with RW & prosthesis with cues on prosthesis control.     Time  1    Period  Months    Status  New    Target Date  03/24/18        PT Long Term Goals - 02/21/18 1511      PT LONG TERM GOAL #1   Title  Patient verbalizes  & demonstrates understanding of prosthetic care with new prosthesis to enable safe  use. (Target Date: 07/20/2017)    Baseline        Time  3    Period  Months    Status  New    Target Date  05/20/18      PT LONG TERM GOAL #2   Title  Patient tolerates wear of new prosthesis >90% of awake hours without skin issues to enable safe use throughout her day.    Baseline        Time  3    Period  Months    Status  New    Target Date  05/20/18      PT LONG TERM GOAL #3   Title  Patient ambulates 400' with LRAD & new prosthesis modified independent for community mobility.     Baseline        Time  3    Period  Months    Status  New    Target Date  05/20/18      PT LONG TERM GOAL #4   Title  Patient negotiates ramps, curbs & stairs with LRAD & new prosthesis modified independent for community access.     Baseline        Time  3    Period  Months    Status  New    Target Date  05/20/18      PT LONG TERM GOAL #5   Title  Patient ambulates around furniture 100' with LRAD (single UE support) and new prosthesis carrying cup of water modified independent.    Baseline        Time  3    Period  Months    Status  New    Target Date  05/20/18      Additional Long Term Goals   Additional Long Term Goals  Yes      PT LONG TERM GOAL #6   Title  Berg Balance > 36/56 to indicate lower fall risk.    Baseline        Time  3    Period  Months    Status  New    Target Date  05/20/18            Plan - 03/15/18 1330    Clinical Impression Statement  Today's skilled session continued to focus on knee control/use with gait/barriers with carryover noted throughout session. Knee flexion is limited today due to new cover placed on prosthesis, pt advised to store prosthesis in bent knee position to assist with stretching it out. Also went over set up of Nustep as pt plans to resume working out at gym after today. The pt is progressing well toward goals and should benefit from continued PT to  progress toward umet goals.     Rehab Potential  Good    PT Frequency  2x / week    PT Duration  Other (comment)   90 days (13 weeks)   PT Treatment/Interventions  ADLs/Self Care Home Management;DME Instruction;Gait training;Stair training;Functional mobility training;Therapeutic activities;Balance training;Therapeutic exercise;Neuromuscular re-education;Patient/family education;Prosthetic Training;Manual techniques;Canalith Repostioning;Vestibular    PT Next Visit Plan  continue to work on gait with quad cane with emphasis on knee control; work on balance activities that promote anterior and/or left lateral weight shifting.     Consulted and Agree with Plan of Care  Patient       Patient will benefit from skilled therapeutic intervention in order to improve the following deficits and impairments:  Abnormal gait, Decreased activity tolerance, Decreased balance, Decreased  endurance, Decreased mobility, Decreased range of motion, Impaired flexibility, Prosthetic Dependency, Postural dysfunction, Decreased strength  Visit Diagnosis: Other abnormalities of gait and mobility  Unsteadiness on feet  Muscle weakness (generalized)     Problem List Patient Active Problem List   Diagnosis Date Noted  . Memory loss 11/23/2017  . Takotsubo cardiomyopathy   . Hypertensive heart disease   . Brain aneurysm 04/13/2016  . Dyslipidemia   . CAD in native artery   . PAC (premature atrial contraction) 08/14/2014  . PVC's (premature ventricular contractions) 08/14/2014  . Palpitations 11/02/2013  . Essential hypertension, benign 11/01/2013  . Peripheral vascular disease, unspecified (HCC) 11/01/2013    Sallyanne Kuster, PTA, [facility] [address], [address] [place], [place] [address] [phone] 03/16/18, 12:57 PM   Name: TALIYA KOFFEL MRN: [id number] Date of Birth: 1941/03/24

## 2018-03-17 ENCOUNTER — Encounter: Payer: Self-pay | Admitting: Physical Therapy

## 2018-03-17 ENCOUNTER — Ambulatory Visit: Payer: Medicare Other | Admitting: Physical Therapy

## 2018-03-17 DIAGNOSIS — M6281 Muscle weakness (generalized): Secondary | ICD-10-CM

## 2018-03-17 DIAGNOSIS — Z9181 History of falling: Secondary | ICD-10-CM

## 2018-03-17 DIAGNOSIS — R2681 Unsteadiness on feet: Secondary | ICD-10-CM

## 2018-03-17 DIAGNOSIS — R2689 Other abnormalities of gait and mobility: Secondary | ICD-10-CM

## 2018-03-19 NOTE — Therapy (Signed)
[facility] [address] [place], [place], [address] Phone: [phone]   Fax:  [phone]  Physical Therapy Treatment  Patient Details  Name: [name] MRN: [id number] Date of Birth: Mar 03, 1941 Referring Provider: Aldean Baker, MD   Encounter Date: 03/17/2018     03/17/18 0853  PT Visits / Re-Eval  Visit Number 8  Number of Visits 26  Date for PT Re-Evaluation 05/20/18  Authorization  Authorization Type [employer] Medicare & Medicaid  PT Time Calculation  PT Start Time 0849  PT Stop Time 0930  PT Time Calculation (min) 41 min  PT - End of Session  Equipment Utilized During Treatment Gait belt  Activity Tolerance Patient tolerated treatment well  Behavior During Therapy [facility] for tasks assessed/performed    Past Medical History:  Diagnosis Date  . CAD in native artery    a. 04/2016 NSTEMI with cath showing 25% MCx, 75% DX1 lesion and moderate LVD 35-45% out of proportion to CAD and in a pattern of Takotsubo CM (no clear stressor)  . Dyslipidemia   . GERD (gastroesophageal reflux disease)   . Hypercholesteremia   . Hypertension   . Hypertensive heart disease    initially she was thought to have a HOCM variant by cath with increased LVOT gradient but echo showed only a gradient across the LVOT and SAM and cardiac MRI did not show any late gad enhancement and only a LVOT gradient with 15mm BSH.  There was some mild SAM and mild MR.  EF normal at 76%.    [name] LVH (left ventricular hypertrophy)    a. cMRI 2018 - showing moderate Basal septal hypertrophy not classic for HOCM morphology with no delayed gadolium uptake to suggest myofibrillar disarray, + SAM with small LVOT gradient in regard to physiology and mild MR.  . NSTEMI (non-ST elevated myocardial infarction) (HCC) 04/2016   secondary to stress CM - Takotsubo CM  . PAC (premature atrial contraction)    Event monitors 10/2013 and 08/2014 showed NSR, ST,  PACs, PVCs.   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 Posterior communicating artery aneurysm    followed by Dr Venetia Maxon  . PVC's (premature ventricular contractions)    Event monitors 10/2013 and 08/2014 showed NSR, ST, PACs, PVCs.   . 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 ABDOMINAL HYSTERECTOMY    . 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 CARDIAC CATHETERIZATION N/A 04/21/2016   Procedure: Left Heart Cath and Coronary Angiography;  Surgeon: Corky Crafts, MD;  Location: [facility] INVASIVE CV LAB;  Service: Cardiovascular;  Laterality: N/A;  . IR GENERIC HISTORICAL  02/13/2016   IR RADIOLOGY PERIPHERAL GUIDED IV START 02/13/2016 Berdine Dance, MD [facility]-INTERV RAD  . IR GENERIC HISTORICAL  02/13/2016   IR US GUIDE VASC ACCESS LEFT 02/13/2016 Berdine Dance, MD [facility]    There were no vitals filed for this visit.      03/17/18 0852  Symptoms/Limitations  Subjective No new complaints. No falls or pain to report.   Pertinent History R TFA, CAD, HTN, NSTEMI, PAD,   Patient Stated Goals She wants to learn to use new prosthesis to stand & walk better.   Pain Assessment  Currently in Pain? No/denies     03/17/18 0854  Transfers  Transfers Sit to Stand;Stand to Sit  Sit to Stand 6: Modified independent (Device/Increase time);With upper extremity assist  Stand to Sit 6: Modified independent (Device/Increase time)  Ambulation/Gait  Ambulation/Gait Yes  Ambulation/Gait Assistance 5: Supervision;4: Min guard;4: Min assist  Ambulation/Gait Assistance Details pt noted to be short on prosthesis side. had her remove 1 of the 2 shoe lifts from her left shoe with a more level height noted with remainder of gait. verbal/tactile/manual facilitation for weight shifting anterior/lateral with pelvic position to fully engage prosthetic knee  with gait. 1st lap and 2cd lap indoors with RW working on height adjustment and prosthetic knee flexion with gait. 3rd lap was outdoors with RW. 4th and final lap was with small base quad cane with up to min assist needed with cues for increased prosthetic stance time with increased left step length.                               Ambulation Distance (Feet) 120 Feet (x1, 230 x1, 300 x1, 120 x1)  Assistive device Rolling walker;Prosthesis;Small based quad cane  Gait Pattern Step-through pattern;Decreased stride length;Narrow base of support;Trunk flexed;Decreased step length - right;Decreased stance time - right;Decreased weight shift to right;Decreased hip/knee flexion - right  Curb 5: Supervision  Curb Details (indicate cue type and reason) with RW/prosthesis with outdoor curb  Prosthetics  Prosthetic Care Comments  does report some "digging in" of socket at her buttock area. Has tried socks with no relief. Plans to call Trey Paula to see about having an adjustment done to the socket fit. Prosthetic knee is getting looser as she has been storing it with the knee bent back to stretch the cover.   Current prosthetic wear tolerance (days/week)  daily  Current prosthetic wear tolerance (#hours/day)  most awake hours out of bed (average is from 11am to 5 pm).   Residual limb condition  no issues per pt report  Donning Prosthesis 5  Doffing Prosthesis 5        PT Short Term Goals - 02/21/18 1515      PT SHORT TERM GOAL #1   Title  Patient sit to/from stand chairs with armrests to cane engaging prosthetic knee properly with supervision. (Target Date: 03/24/2018)    Time  1    Period  Months    Status  New    Target Date  03/24/18      PT SHORT TERM GOAL #2   Title  Patient reaches forward 5" without UE support with supervision.     Time  1    Period  Months    Status  New    Target Date  03/24/18      PT SHORT TERM GOAL #3   Title  Patient ambulates 300' with RW & prosthesis with cues on  prosthesis control.     Time  1    Period  Months    Status  New    Target Date  03/24/18        PT Long Term Goals - 02/21/18 1511      PT LONG TERM GOAL #1   Title  Patient verbalizes & demonstrates understanding of prosthetic care with new prosthesis to enable safe use. (Target Date: 07/20/2017)    Baseline        Time  3    Period  Months    Status  New    Target Date  05/20/18  PT LONG TERM GOAL #2   Title  Patient tolerates wear of new prosthesis >90% of awake hours without skin issues to enable safe use throughout her day.    Baseline        Time  3    Period  Months    Status  New    Target Date  05/20/18      PT LONG TERM GOAL #3   Title  Patient ambulates 400' with LRAD & new prosthesis modified independent for community mobility.     Baseline        Time  3    Period  Months    Status  New    Target Date  05/20/18      PT LONG TERM GOAL #4   Title  Patient negotiates ramps, curbs & stairs with LRAD & new prosthesis modified independent for community access.     Baseline        Time  3    Period  Months    Status  New    Target Date  05/20/18      PT LONG TERM GOAL #5   Title  Patient ambulates around furniture 100' with LRAD (single UE support) and new prosthesis carrying cup of water modified independent.    Baseline        Time  3    Period  Months    Status  New    Target Date  05/20/18      Additional Long Term Goals   Additional Long Term Goals  Yes      PT LONG TERM GOAL #6   Title  Berg Balance > 36/56 to indicate lower fall risk.    Baseline        Time  3    Period  Months    Status  New    Target Date  05/20/18         03/17/18 0853  Plan  Clinical Impression Statement Today's skilled session continued with prosthetic training with both the RW and small base quad cane. Pt continues to need increased assistance with use of cane vs RW, however did progress from min assist to min guard assist at time with gait. Pt also able to go  further today with cane before needing a rest break. The pt is progressing toward goals and should benefit from continued PT to progress toward unmet goals.   Pt will benefit from skilled therapeutic intervention in order to improve on the following deficits Abnormal gait;Decreased activity tolerance;Decreased balance;Decreased endurance;Decreased mobility;Decreased range of motion;Impaired flexibility;Prosthetic Dependency;Postural dysfunction;Decreased strength  Rehab Potential Good  PT Frequency 2x / week  PT Duration Other (comment) (90 days (13 weeks))  PT Treatment/Interventions ADLs/Self Care Home Management;DME Instruction;Gait training;Stair training;Functional mobility training;Therapeutic activities;Balance training;Therapeutic exercise;Neuromuscular re-education;Patient/family education;Prosthetic Training;Manual techniques;Canalith Repostioning;Vestibular  PT Next Visit Plan continue to work on gait with quad cane with emphasis on knee control; work on balance activities that promote anterior and/or left lateral weight shifting.   Consulted and Agree with Plan of Care Patient          Patient will benefit from skilled therapeutic intervention in order to improve the following deficits and impairments:  Abnormal gait, Decreased activity tolerance, Decreased balance, Decreased endurance, Decreased mobility, Decreased range of motion, Impaired flexibility, Prosthetic Dependency, Postural dysfunction, Decreased strength  Visit Diagnosis: Other abnormalities of gait and mobility  Unsteadiness on feet  Muscle weakness (generalized)  History of falling  Problem List Patient Active Problem List   Diagnosis Date Noted  . Memory loss 11/23/2017  . Takotsubo cardiomyopathy   . Hypertensive heart disease   . Brain aneurysm 04/13/2016  . Dyslipidemia   . CAD in native artery   . PAC (premature atrial contraction) 08/14/2014  . PVC's (premature ventricular contractions)  08/14/2014  . Palpitations 11/02/2013  . Essential hypertension, benign 11/01/2013  . Peripheral vascular disease, unspecified (HCC) 11/01/2013    Sallyanne Kuster, PTA, [facility] [address] [place], [place] [address] [phone] 03/19/18, 5:40 PM   Name: [name] MRN: [id number] Date of Birth: 04/20/1941

## 2018-03-21 ENCOUNTER — Encounter: Payer: Self-pay | Admitting: Rehabilitation

## 2018-03-21 ENCOUNTER — Ambulatory Visit: Payer: Medicare Other | Admitting: Rehabilitation

## 2018-03-21 DIAGNOSIS — R2689 Other abnormalities of gait and mobility: Secondary | ICD-10-CM

## 2018-03-21 DIAGNOSIS — M6281 Muscle weakness (generalized): Secondary | ICD-10-CM

## 2018-03-21 DIAGNOSIS — Z9181 History of falling: Secondary | ICD-10-CM

## 2018-03-21 DIAGNOSIS — R2681 Unsteadiness on feet: Secondary | ICD-10-CM

## 2018-03-21 NOTE — Therapy (Signed)
[facility] [address] [place], [place], [address] Phone: [phone]   Fax:  [phone]  Physical Therapy Treatment  Patient Details  Name: [name] MRN: [id number] Date of Birth: June 26, 1941 Referring Provider: Aldean Baker, MD   Encounter Date: 03/21/2018  PT End of Session - 03/21/18 1407    Visit Number  9    Number of Visits  26    Date for PT Re-Evaluation  05/20/18    Authorization Type  [employer]HC Medicare & Medicaid    PT Start Time  1403    PT Stop Time  1445    PT Time Calculation (min)  42 min    Equipment Utilized During Treatment  Gait belt    Activity Tolerance  Patient tolerated treatment well    Behavior During Therapy  WFL for tasks assessed/performed       Past Medical History:  Diagnosis Date  . CAD in native artery    a. 04/2016 NSTEMI with cath showing 25% MCx, 75% DX1 lesion and moderate LVD 35-45% out of proportion to CAD and in a pattern of Takotsubo CM (no clear stressor)  . Dyslipidemia   . GERD (gastroesophageal reflux disease)   . Hypercholesteremia   . Hypertension   . Hypertensive heart disease    initially she was thought to have a HOCM variant by cath with increased LVOT gradient but echo showed only a gradient across the LVOT and SAM and cardiac MRI did not show any late gad enhancement and only a LVOT gradient with 15mm BSH.  There was some mild SAM and mild MR.  EF normal at 76%.    [name] LVH (left ventricular hypertrophy)    a. cMRI 2018 - showing moderate Basal septal hypertrophy not classic for HOCM morphology with no delayed gadolium uptake to suggest myofibrillar disarray, + SAM with small LVOT gradient in regard to physiology and mild MR.  . NSTEMI (non-ST elevated myocardial infarction) (HCC) 04/2016   secondary to stress CM - Takotsubo CM  . PAC (premature atrial contraction)    Event monitors 10/2013 and 08/2014 showed NSR, ST, PACs, PVCs.   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 Posterior communicating artery aneurysm    followed by Dr Venetia Maxon  . PVC's (premature ventricular contractions)    Event monitors 10/2013 and 08/2014 showed NSR, ST, PACs, PVCs.   . 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 ABDOMINAL HYSTERECTOMY    . 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 CARDIAC CATHETERIZATION N/A 04/21/2016   Procedure: Left Heart Cath and Coronary Angiography;  Surgeon: Corky Crafts, MD;  Location: [facility] INVASIVE CV LAB;  Service: Cardiovascular;  Laterality: N/A;  . IR GENERIC HISTORICAL  02/13/2016   IR RADIOLOGY PERIPHERAL GUIDED IV START 02/13/2016 Berdine Dance, MD [facility]  . IR GENERIC HISTORICAL  02/13/2016   IR US GUIDE VASC ACCESS LEFT 02/13/2016 Berdine Dance, MD [facility]    There were no vitals filed for this visit.  Subjective Assessment - 03/21/18 1406    Subjective  Pt reports near fall over the weekend.  Knee was unlocked, but was able to catch herself on sink.     Pertinent History  R TFA, CAD, HTN, NSTEMI, PAD,  Patient Stated Goals  She wants to learn to use new prosthesis to stand & walk better.     Currently in Pain?  No/denies                       [facility] Adult PT Treatment/Exercise - 03/21/18 1415      Transfers   Transfers  Sit to Stand;Stand to Sit    Sit to Stand  6: Modified independent (Device/Increase time);With upper extremity assist    Stand to Sit  6: Modified independent (Device/Increase time)      Ambulation/Gait   Ambulation/Gait  Yes    Ambulation/Gait Assistance  5: Supervision;4: Min assist    Ambulation/Gait Assistance Details  Pt ambulatory into clinic with RW, note that she continues to demonstrate R hip retraction during stance phase of gait and has difficulty loading RLE.  She requires cues for allowing  forward weight shift onto LLE to allow R knee to unlock and advance.  Therefore went through counter top weight shift exercise again during session with pt able to demo marked improvement in technique prior to starting ambulation again.  Had her continue with RW initially to work on quality of gait.  Note she still requires cues for forward weight shift without trunk/shoulder rotation.  Also provided cues for pt to try continuous movement with RW, rather than step to pattern where she steps then moves walker prior to stepping again.   At end of session, performed two bouts of gait with SBQC x 80' x 2 with min A.  Cues and facilitation for improved foward weight shift onto RLE during stance, cues for upright posture and to decrease LLE IR.  Pt making good progress with gait at end of session.     Ambulation Distance (Feet)  90 Feet   x 2, 115' with RW, 150' w/ RW, 80' x 2 with [facility]   Assistive device  Rolling walker;Small based quad cane    Gait Pattern  Step-through pattern;Decreased stride length;Narrow base of support;Trunk flexed;Decreased step length - right;Decreased stance time - right;Decreased weight shift to right;Decreased hip/knee flexion - right    Ambulation Surface  Level;Indoor    Pre-Gait Activities  Continue to address standing weight shift exercise at counter top.  LLE slightly in front of RLE, shifting forward to unlock R knee, advancing RLE, placing weight through RLE with emphasis on improving hip extension in stance without trunk rotation.  Performed with RUE on counter and L hand on back of chair for safety.  Performed x 15 reps progressing to performing with RW (same exercise) x 10 reps.        Self-Care   Self-Care  Other Self-Care Comments    Other Self-Care Comments   Education on how a fall/near fall can effect confidence with walking (with RW and cane).  Pt reports she did try to use cane and she "just couldn't get anywhere with it."  Educated to begin at counter top with cane  in LUE and counter on R for improved safety.  Pt verbalized understanding.              PT Education - 03/21/18 1530    Education Details  see self care    Person(s) Educated  Patient    Methods  Explanation    Comprehension  Verbalized understanding       PT Short Term Goals - 02/21/18 1515      PT SHORT TERM GOAL #1  Title  Patient sit to/from stand chairs with armrests to cane engaging prosthetic knee properly with supervision. (Target Date: 03/24/2018)    Time  1    Period  Months    Status  New    Target Date  03/24/18      PT SHORT TERM GOAL #2   Title  Patient reaches forward 5" without UE support with supervision.     Time  1    Period  Months    Status  New    Target Date  03/24/18      PT SHORT TERM GOAL #3   Title  Patient ambulates 300' with RW & prosthesis with cues on prosthesis control.     Time  1    Period  Months    Status  New    Target Date  03/24/18        PT Long Term Goals - 02/21/18 1511      PT LONG TERM GOAL #1   Title  Patient verbalizes & demonstrates understanding of prosthetic care with new prosthesis to enable safe use. (Target Date: 07/20/2017)    Baseline        Time  3    Period  Months    Status  New    Target Date  05/20/18      PT LONG TERM GOAL #2   Title  Patient tolerates wear of new prosthesis >90% of awake hours without skin issues to enable safe use throughout her day.    Baseline        Time  3    Period  Months    Status  New    Target Date  05/20/18      PT LONG TERM GOAL #3   Title  Patient ambulates 400' with LRAD & new prosthesis modified independent for community mobility.     Baseline        Time  3    Period  Months    Status  New    Target Date  05/20/18      PT LONG TERM GOAL #4   Title  Patient negotiates ramps, curbs & stairs with LRAD & new prosthesis modified independent for community access.     Baseline        Time  3    Period  Months    Status  New    Target Date  05/20/18      PT  LONG TERM GOAL #5   Title  Patient ambulates around furniture 100' with LRAD (single UE support) and new prosthesis carrying cup of water modified independent.    Baseline        Time  3    Period  Months    Status  New    Target Date  05/20/18      Additional Long Term Goals   Additional Long Term Goals  Yes      PT LONG TERM GOAL #6   Title  Berg Balance > 36/56 to indicate lower fall risk.    Baseline        Time  3    Period  Months    Status  New    Target Date  05/20/18            Plan - 03/21/18 1407    Clinical Impression Statement  Skilled session focused on prosthetic training, again with emphasis on loading prosthesis and also forward weight shift during gait to unlock  knee for forward advancement of RLE.  Pt had near fall over the weekend and feel that this negatively impacted pts progress with mobility.      Rehab Potential  Good    PT Frequency  2x / week    PT Duration  Other (comment)   90 days (13 weeks)   PT Treatment/Interventions  ADLs/Self Care Home Management;DME Instruction;Gait training;Stair training;Functional mobility training;Therapeutic activities;Balance training;Therapeutic exercise;Neuromuscular re-education;Patient/family education;Prosthetic Training;Manual techniques;Canalith Repostioning;Vestibular    PT Next Visit Plan  check STGs and make new 30 days goals, progress note, continue to work on gait with quad cane with emphasis on knee control; work on balance activities that promote anterior and/or left lateral weight shifting.     Consulted and Agree with Plan of Care  Patient       Patient will benefit from skilled therapeutic intervention in order to improve the following deficits and impairments:  Abnormal gait, Decreased activity tolerance, Decreased balance, Decreased endurance, Decreased mobility, Decreased range of motion, Impaired flexibility, Prosthetic Dependency, Postural dysfunction, Decreased strength  Visit Diagnosis: Other  abnormalities of gait and mobility  Unsteadiness on feet  Muscle weakness (generalized)  History of falling     Problem List Patient Active Problem List   Diagnosis Date Noted  . Memory loss 11/23/2017  . Takotsubo cardiomyopathy   . Hypertensive heart disease   . Brain aneurysm 04/13/2016  . Dyslipidemia   . CAD in native artery   . PAC (premature atrial contraction) 08/14/2014  . PVC's (premature ventricular contractions) 08/14/2014  . Palpitations 11/02/2013  . Essential hypertension, benign 11/01/2013  . Peripheral vascular disease, unspecified (HCC) 11/01/2013    Harriet Butte, PT, MPT [facility] [address] [place], [place], [address] Phone: [phone]   Fax:  [phone] 03/21/18, 3:33 PM  Name: [name] MRN: [id number] Date of Birth: January 19, 1941

## 2018-03-22 ENCOUNTER — Ambulatory Visit: Payer: Medicare Other | Admitting: Physician Assistant

## 2018-03-23 ENCOUNTER — Encounter: Payer: Self-pay | Admitting: Physical Therapy

## 2018-03-23 ENCOUNTER — Ambulatory Visit: Payer: Medicare Other | Admitting: Physical Therapy

## 2018-03-23 DIAGNOSIS — R2689 Other abnormalities of gait and mobility: Secondary | ICD-10-CM

## 2018-03-23 DIAGNOSIS — M6281 Muscle weakness (generalized): Secondary | ICD-10-CM

## 2018-03-23 DIAGNOSIS — R2681 Unsteadiness on feet: Secondary | ICD-10-CM

## 2018-03-27 NOTE — Therapy (Signed)
[facility] [address] [place], [place], [address] Phone: [phone]   Fax:  [phone]  Physical Therapy Treatment  Patient Details  Name: [name] MRN: [id number] Date of Birth: 1940-11-22 Referring Provider: Meridee Score, MD   Encounter Date: 03/23/2018     03/23/18 1400  PT Visits / Re-Eval  Visit Number 10  Number of Visits 26  Date for PT Re-Evaluation 05/20/18  Authorization  Authorization Type [employer] Medicare & Medicaid  PT Time Calculation  PT Start Time 1400  PT Stop Time 1440  PT Time Calculation (min) 40 min  PT - End of Session  Equipment Utilized During Treatment Gait belt  Activity Tolerance Patient tolerated treatment well  Behavior During Therapy [facility] for tasks assessed/performed     Past Medical History:  Diagnosis Date  . CAD in native artery    a. 04/2016 NSTEMI with cath showing 25% MCx, 75% DX1 lesion and moderate LVD 35-45% out of proportion to CAD and in a pattern of Takotsubo CM (no clear stressor)  . Dyslipidemia   . GERD (gastroesophageal reflux disease)   . Hypercholesteremia   . Hypertension   . Hypertensive heart disease    initially she was thought to have a HOCM variant by cath with increased LVOT gradient but echo showed only a 67mHg gradient across the LVOT and SAM and cardiac MRI did not show any late gad enhancement and only a 532mg LVOT gradient with 1543mSH.  There was some mild SAM and mild MR.  EF normal at 76%.    . LMarland KitchenH (left ventricular hypertrophy)    a. cMRI 2018 - showing moderate Basal septal hypertrophy not classic for HOCM morphology with no delayed gadolium uptake to suggest myofibrillar disarray, + SAM with small LVOT gradient in regard to physiology and mild MR.  . [place]STEMI (non-ST elevated myocardial infarction) [place]   secondary to stress CM - Takotsubo CM  . PAC (premature atrial contraction)    Event monitors 10/2013 and 08/2014 showed NSR,  ST, PACs, PVCs.   . 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 Posterior communicating artery aneurysm    followed by Dr SteVertell Limber PVC's (premature ventricular contractions)    Event monitors 10/2013 and 08/2014 showed NSR, ST, PACs, PVCs.   . 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 ABDOMINAL HYSTERECTOMY    . 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 CARDIAC CATHETERIZATION N/A 04/21/2016   Procedure: Left Heart Cath and Coronary Angiography;  Surgeon: JayJettie BoozeD;  Location: [facility];  Service: Cardiovascular;  Laterality: N/A;  . IR GENERIC HISTORICAL  02/13/2016   IR RADIOLOGY PERIPHERAL GUIDED IV START 02/13/2016 MicGreggory KeenD [facility]  . IR GENERIC HISTORICAL  02/13/2016   IR US [place] VASC ACCESS LEFT 02/13/2016 MicGreggory KeenD [facility]    There were no vitals filed for this visit.     03/23/18 1400  Symptoms/Limitations  Subjective Patient reports no falls. She is starting to be a little more comfortable with new prosthetic knee with RW.   Pertinent History R TFA, CAD, HTN, NSTEMI, PAD,   Patient Stated Goals She wants to learn to use new prosthesis to stand & walk better.   Pain Assessment  Currently in  Pain? No/denies     03/23/18 1400  Transfers  Transfers Sit to Stand;Stand to Sit  Sit to Stand 6: Modified independent (Device/Increase time);With upper extremity assist;With armrests;From chair/3-in-1 (to RW)  Stand to Sit 6: Modified independent (Device/Increase time);With upper extremity assist;With armrests;To chair/3-in-1 (from RW)  Ambulation/Gait  Ambulation/Gait Yes  Ambulation/Gait Assistance 5: Supervision;4: Min assist  Ambulation/Gait Assistance Details tactile & verbal cues on pelvic wt shift to unlock prosthetic knee.   Ambulation Distance (Feet) 300 Feet (300' RW &  64' SBQC)  Assistive device Rolling walker;Small based quad cane;Prosthesis  Gait Pattern Step-through pattern;Decreased stride length;Narrow base of support;Trunk flexed;Decreased step length - right;Decreased stance time - right;Decreased weight shift to right;Decreased hip/knee flexion - right  Ambulation Surface Indoor;Level                         03/23/18 2226  Plan  Clinical Impression Statement Patient met all STGs set for first 30 days of certification period. She has improved ability to control with sit to/from stand & RW with verbal cues.   Pt will benefit from skilled therapeutic intervention in order to improve on the following deficits Abnormal gait;Decreased activity tolerance;Decreased balance;Decreased endurance;Decreased mobility;Decreased range of motion;Impaired flexibility;Prosthetic Dependency;Postural dysfunction;Decreased strength  Rehab Potential Good  PT Frequency 2x / week  PT Duration Other (comment) (90 days (13 weeks))  PT Treatment/Interventions ADLs/Self Care Home Management;DME Instruction;Gait training;Stair training;Functional mobility training;Therapeutic activities;Balance training;Therapeutic exercise;Neuromuscular re-education;Patient/family education;Prosthetic Training;Manual techniques;Canalith Repostioning;Vestibular  PT Next Visit Plan continue to work on gait with quad cane with emphasis on knee control; work on balance activities that promote anterior and/or left lateral weight shifting.   Consulted and Agree with Plan of Care Patient        PT Short Term Goals - 03/23/18 2215      PT SHORT TERM GOAL #1   Title  Patient sit to/from stand chairs with armrests to cane engaging prosthetic knee properly with supervision. (Target Date: 03/24/2018)    Baseline  MET 03/23/2018    Time  1    Period  Months    Status  Achieved      PT SHORT TERM GOAL #2   Title  Patient reaches forward 5" without UE support with supervision.      Baseline  MET 03/23/2018    Time  1    Period  Months    Status  Achieved      PT SHORT TERM GOAL #3   Title  Patient ambulates 300' with RW & prosthesis with cues on prosthesis control.     Baseline  MET 03/23/2018    Time  1    Period  Months    Status  Achieved         03/27/18 2228  PT SHORT TERM GOAL #1  Title Patient sit to/from stand chairs without armrests to cane engaging prosthetic knee properly with supervision. (Target Date: 04/22/2018)  Baseline    Time 4  Period Weeks  Status Revised  Target Date 04/22/18  PT SHORT TERM GOAL #2  Title Patient able to pick up 1# from floor with cane support with supervision.   Time 1  Period Months  Status New  Target Date 04/22/18  PT SHORT TERM GOAL #3  Title Patient negotiates ramps, curbs with RW & stairs with 1 rail/cane with min guard.   Time 1  Period Months  Status New  Target Date 04/22/18  PT SHORT TERM GOAL #  4  Title Patient ambulates 59' with cane & prosthesis with minA.   Baseline     Time 1  Period Months  Status New  Target Date 04/22/18    PT Long Term Goals - 03/23/18 2216      PT LONG TERM GOAL #1   Title  Patient verbalizes & demonstrates understanding of prosthetic care with new prosthesis to enable safe use. (Target Date: 07/20/2017)    Baseline        Time  3    Period  Months    Status  On-going    Target Date  05/20/18      PT LONG TERM GOAL #2   Title  Patient tolerates wear of new prosthesis >90% of awake hours without skin issues to enable safe use throughout her day.    Baseline        Time  3    Period  Months    Status  On-going    Target Date  05/20/18      PT LONG TERM GOAL #3   Title  Patient ambulates 400' with LRAD & new prosthesis modified independent for community mobility.     Baseline        Time  3    Period  Months    Status  On-going    Target Date  05/20/18      PT LONG TERM GOAL #4   Title  Patient negotiates ramps, curbs & stairs with LRAD & new prosthesis  modified independent for community access.     Baseline        Time  3    Period  Months    Status  On-going    Target Date  05/20/18      PT LONG TERM GOAL #5   Title  Patient ambulates around furniture 100' with LRAD (single UE support) and new prosthesis carrying cup of water modified independent.    Baseline        Time  3    Period  Months    Status  On-going    Target Date  05/20/18      PT LONG TERM GOAL #6   Title  Berg Balance > 36/56 to indicate lower fall risk.    Baseline        Time  3    Period  Months    Status  On-going    Target Date  05/20/18              Patient will benefit from skilled therapeutic intervention in order to improve the following deficits and impairments:     Visit Diagnosis: Other abnormalities of gait and mobility  Unsteadiness on feet  Muscle weakness (generalized)     Problem List Patient Active Problem List   Diagnosis Date Noted  . Memory loss 11/23/2017  . Takotsubo cardiomyopathy   . Hypertensive heart disease   . Brain aneurysm 04/13/2016  . Dyslipidemia   . CAD in native artery   . PAC (premature atrial contraction) 08/14/2014  . PVC's (premature ventricular contractions) 08/14/2014  . Palpitations 11/02/2013  . Essential hypertension, benign 11/01/2013  . Peripheral vascular disease, unspecified ([place]) 11/01/2013    , PT, DPT 03/27/2018, 10:18 PM  [facility] [address] [place], [place], [address] Phone: [phone]   Fax:  [phone]  Name: RANDEE HUSTON MRN: [id number] Date of Birth: 1940-12-29

## 2018-03-28 ENCOUNTER — Encounter: Payer: Self-pay | Admitting: Physical Therapy

## 2018-03-28 ENCOUNTER — Ambulatory Visit: Payer: Medicare Other | Admitting: Physical Therapy

## 2018-03-28 DIAGNOSIS — R2689 Other abnormalities of gait and mobility: Secondary | ICD-10-CM

## 2018-03-28 DIAGNOSIS — M6281 Muscle weakness (generalized): Secondary | ICD-10-CM

## 2018-03-28 DIAGNOSIS — R2681 Unsteadiness on feet: Secondary | ICD-10-CM

## 2018-03-28 DIAGNOSIS — Z9181 History of falling: Secondary | ICD-10-CM

## 2018-03-29 NOTE — Therapy (Signed)
[facility] [address] [place], [place], [address] Phone: [phone]   Fax:  [phone]  Physical Therapy Treatment  Patient Details  Name: [name] MRN: [id number] Date of Birth: Apr 04, 1941 Referring Provider: Aldean Baker, MD   Encounter Date: 03/28/2018  PT End of Session - 03/28/18 1800    Visit Number  11    Number of Visits  26    Date for PT Re-Evaluation  05/20/18    Authorization Type  [employer]HC Medicare & Medicaid    PT Start Time  1400    PT Stop Time  1445    PT Time Calculation (min)  45 min    Equipment Utilized During Treatment  Gait belt    Activity Tolerance  Patient tolerated treatment well    Behavior During Therapy  WFL for tasks assessed/performed       Past Medical History:  Diagnosis Date  . CAD in native artery    a. 04/2016 NSTEMI with cath showing 25% MCx, 75% DX1 lesion and moderate LVD 35-45% out of proportion to CAD and in a pattern of Takotsubo CM (no clear stressor)  . Dyslipidemia   . GERD (gastroesophageal reflux disease)   . Hypercholesteremia   . Hypertension   . Hypertensive heart disease    initially she was thought to have a HOCM variant by cath with increased LVOT gradient but echo showed only a gradient across the LVOT and SAM and cardiac MRI did not show any late gad enhancement and only a LVOT gradient with 15mm BSH.  There was some mild SAM and mild MR.  EF normal at 76%.    [name] LVH (left ventricular hypertrophy)    a. cMRI 2018 - showing moderate Basal septal hypertrophy not classic for HOCM morphology with no delayed gadolium uptake to suggest myofibrillar disarray, + SAM with small LVOT gradient in regard to physiology and mild MR.  . NSTEMI (non-ST elevated myocardial infarction) (HCC) 04/2016   secondary to stress CM - Takotsubo CM  . PAC (premature atrial contraction)    Event monitors 10/2013 and 08/2014 showed NSR, ST, PACs, PVCs.   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 Posterior communicating artery aneurysm    followed by Dr Venetia Maxon  . PVC's (premature ventricular contractions)    Event monitors 10/2013 and 08/2014 showed NSR, ST, PACs, PVCs.   . 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 ABDOMINAL HYSTERECTOMY    . 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 CARDIAC CATHETERIZATION N/A 04/21/2016   Procedure: Left Heart Cath and Coronary Angiography;  Surgeon: Corky Crafts, MD;  Location: [facility];  Service: Cardiovascular;  Laterality: N/A;  . IR GENERIC HISTORICAL  02/13/2016   IR RADIOLOGY PERIPHERAL GUIDED IV START 02/13/2016 Berdine Dance, MD [facility]  . IR GENERIC HISTORICAL  02/13/2016   IR US GUIDE VASC ACCESS LEFT 02/13/2016 Berdine Dance, MD [facility]    There were no vitals filed for this visit.  Subjective Assessment - 03/28/18 1400    Subjective  No falls.     Pertinent History  R TFA, CAD, HTN, NSTEMI, PAD,     Patient Stated Goals  She wants to learn to use new prosthesis to stand & walk better.  Currently in Pain?  No/denies                       [facility]/Exercise - 03/28/18 1400      Transfers   Transfers  Sit to Stand;Stand to Sit    Sit to Stand  5: Supervision;With upper extremity assist;With armrests;From chair/3-in-1   to RW, working on not touching walker to stabilize   Stand to Sit  5: Supervision;With upper extremity assist;With armrests;To chair/3-in-1   from RW, working on not using RW for stability     Ambulation/Gait   Ambulation/Gait  Yes    Ambulation/Gait Assistance  5: Supervision;4: Min assist    Ambulation/Gait Assistance Details  RW: ambulating with fluent motion of RW & LEs.  SBQC: working on sit/stand, gait 10' turn 180*, sit down  8 reps;  gait near counter on Rt side  without touching and returning with sidestepping & backward with cane.    Ambulation Distance (Feet)  100 Feet   100' X 2 RW and 10' X 9 SBQC   Assistive device  Rolling walker;Small based quad cane;Prosthesis    Gait Pattern  Step-through pattern;Decreased stride length;Narrow base of support;Trunk flexed;Decreased step length - right;Decreased stance time - right;Decreased weight shift to right;Decreased hip/knee flexion - right    Ambulation Surface  Indoor;Level      Prosthetics   Current prosthetic wear tolerance (days/week)   daily    Current prosthetic wear tolerance (#hours/day)   most awake hours out of bed (average is from 11am to 5 pm).     Residual limb condition   no issues per pt report               PT Short Term Goals - 03/27/18 2228      PT SHORT TERM GOAL #1   Title  Patient sit to/from stand chairs without armrests to cane engaging prosthetic knee properly with supervision. (Target Date: 04/22/2018)    Baseline       Time  4    Period  Weeks    Status  Revised    Target Date  04/22/18      PT SHORT TERM GOAL #2   Title  Patient able to pick up 1# from floor with cane support with supervision.     Time  1    Period  Months    Status  New    Target Date  04/22/18      PT SHORT TERM GOAL #3   Title  Patient negotiates ramps, curbs with RW & stairs with 1 rail/cane with min guard.     Time  1    Period  Months    Status  New    Target Date  04/22/18      PT SHORT TERM GOAL #4   Title  Patient ambulates 4470' with cane & prosthesis with minA.     Baseline        Time  1    Period  Months    Status  New    Target Date  04/22/18        PT Long Term Goals - 03/23/18 2216      PT LONG TERM GOAL #1   Title  Patient verbalizes & demonstrates understanding of prosthetic care with new prosthesis to enable safe use. (Target Date: 07/20/2017)    Baseline        Time  3    Period  Months    Status  On-going    Target Date  05/20/18      PT LONG TERM  GOAL #2   Title  Patient tolerates wear of new prosthesis >90% of awake hours without skin issues to enable safe use throughout her day.    Baseline        Time  3    Period  Months    Status  On-going    Target Date  05/20/18      PT LONG TERM GOAL #3   Title  Patient ambulates 400' with LRAD & new prosthesis modified independent for community mobility.     Baseline        Time  3    Period  Months    Status  On-going    Target Date  05/20/18      PT LONG TERM GOAL #4   Title  Patient negotiates ramps, curbs & stairs with LRAD & new prosthesis modified independent for community access.     Baseline        Time  3    Period  Months    Status  On-going    Target Date  05/20/18      PT LONG TERM GOAL #5   Title  Patient ambulates around furniture 100' with LRAD (single UE support) and new prosthesis carrying cup of water modified independent.    Baseline        Time  3    Period  Months    Status  On-going    Target Date  05/20/18      PT LONG TERM GOAL #6   Title  Berg Balance > 36/56 to indicate lower fall risk.    Baseline        Time  3    Period  Months    Status  On-going    Target Date  05/20/18            Plan - 03/28/18 1800    Clinical Impression Statement  Patient improved sit to/from stand with decreased support to stabilize. Pt ambulating with [facility] with supervision. Pt improved gait with RW with fluent pattern.     Rehab Potential  Good    PT Frequency  2x / week    PT Duration  Other (comment)   90 days (13 weeks)   PT Treatment/Interventions  ADLs/Self Care Home Management;DME Instruction;Gait training;Stair training;Functional mobility training;Therapeutic activities;Balance training;Therapeutic exercise;Neuromuscular re-education;Patient/family education;Prosthetic Training;Manual techniques;Canalith Repostioning;Vestibular    PT Next Visit Plan  continue to work on gait with quad cane with emphasis on knee control; work on balance activities that  promote anterior and/or left lateral weight shifting.     Consulted and Agree with Plan of Care  Patient       Patient will benefit from skilled therapeutic intervention in order to improve the following deficits and impairments:  Abnormal gait, Decreased activity tolerance, Decreased balance, Decreased endurance, Decreased mobility, Decreased range of motion, Impaired flexibility, Prosthetic Dependency, Postural dysfunction, Decreased strength  Visit Diagnosis: Other abnormalities of gait and mobility  Unsteadiness on feet  Muscle weakness (generalized)  History of falling     Problem List Patient Active Problem List   Diagnosis Date Noted  . Memory loss 11/23/2017  . Takotsubo cardiomyopathy   . Hypertensive heart disease   . Brain aneurysm 04/13/2016  . Dyslipidemia   . CAD in native artery   . PAC (premature atrial contraction) 08/14/2014  . PVC's (premature  ventricular contractions) 08/14/2014  . Palpitations 11/02/2013  . Essential hypertension, benign 11/01/2013  . Peripheral vascular disease, unspecified (HCC) 11/01/2013    , PT, DPT 03/29/2018, 6:29 AM  [facility] [address] [place], [place], [address] Phone: [phone]   Fax:  [phone]  Name: [name] MRN: [id number] Date of Birth: 1940/09/05

## 2018-03-30 ENCOUNTER — Ambulatory Visit: Payer: Medicare Other | Admitting: Physical Therapy

## 2018-03-30 ENCOUNTER — Encounter: Payer: Self-pay | Admitting: Physical Therapy

## 2018-03-30 DIAGNOSIS — Z9181 History of falling: Secondary | ICD-10-CM

## 2018-03-30 DIAGNOSIS — R2681 Unsteadiness on feet: Secondary | ICD-10-CM

## 2018-03-30 DIAGNOSIS — R2689 Other abnormalities of gait and mobility: Secondary | ICD-10-CM | POA: Diagnosis not present

## 2018-03-30 DIAGNOSIS — M6281 Muscle weakness (generalized): Secondary | ICD-10-CM

## 2018-03-31 NOTE — Therapy (Signed)
[facility] [address] [place], [place], [address] Phone: [phone]   Fax:  [phone]  Physical Therapy Treatment  Patient Details  Name: Cindy NgShirley L Fields MRN: [id number] Date of Birth: [birth year] Referring Provider: Aldean BakerMarcus Duda, MD   Encounter Date: 03/30/2018  PT End of Session - 03/30/18 1444    Visit Number  12    Number of Visits  26    Date for PT Re-Evaluation  05/20/18    Authorization Type  [employer] Medicare & Medicaid    PT Start Time  1358    PT Stop Time  1438    PT Time Calculation (min)  40 min    Equipment Utilized During Treatment  Gait belt    Activity Tolerance  Patient tolerated treatment well    Behavior During Therapy  WFL for tasks assessed/performed       Past Medical History:  Diagnosis Date  . CAD in native artery    a. 04/2016 NSTEMI with cath showing 25% MCx, 75% DX1 lesion and moderate LVD 35-45% out of proportion to CAD and in a pattern of Takotsubo CM (no clear stressor)  . Dyslipidemia   . GERD (gastroesophageal reflux disease)   . Hypercholesteremia   . Hypertension   . Hypertensive heart disease    initially she was thought to have a HOCM variant by cath with increased LVOT gradient but echo showed only a 10mmHg gradient across the LVOT and SAM and cardiac MRI did not show any late gad enhancement and only a 5mmHg LVOT gradient with 15mm BSH.  There was some mild SAM and mild MR.  EF normal at 76%.    [name]. LVH (left ventricular hypertrophy)    a. cMRI 2018 - showing moderate Basal septal hypertrophy not classic for HOCM morphology with no delayed gadolium uptake to suggest myofibrillar disarray, + SAM with small LVOT gradient in regard to physiology and mild MR.  . NSTEMI (non-ST elevated myocardial infarction) (HCC) 04/2016   secondary to stress CM - Takotsubo CM  . PAC (premature atrial contraction)    Event monitors 10/2013 and 08/2014 showed NSR, ST, PACs, PVCs.   [name]. 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 Posterior communicating artery aneurysm    followed by Dr Venetia MaxonStern  . PVC's (premature ventricular contractions)    Event monitors 10/2013 and 08/2014 showed NSR, ST, PACs, PVCs.   . 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 ABDOMINAL HYSTERECTOMY    . 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 CARDIAC CATHETERIZATION N/A 04/21/2016   Procedure: Left Heart Cath and Coronary Angiography;  Surgeon: Corky CraftsJayadeep S Varanasi, MD;  [facility];  Service: Cardiovascular;  Laterality: N/A;  . IR GENERIC HISTORICAL  02/13/2016   IR RADIOLOGY PERIPHERAL GUIDED IV START 02/13/2016 Berdine DanceMichael Shick, MD [facility]-INTER[facility]  . IR GENERIC HISTORICAL  02/13/2016   IR US GUIDE VASC ACCESS LEFT 02/13/2016 Berdine DanceMichael Shick, MD [facility]    There were no vitals filed for this visit.  Subjective Assessment - 03/30/18 1359    Subjective  No falls. She has been working on walking with RW with fluency/not stopping, standing & sitting with little to no touch and walking counter with cane.     Pertinent History  R TFA, CAD,  HTN, NSTEMI, PAD,     Patient Stated Goals  She wants to learn to use new prosthesis to stand & walk better.     Currently in Pain?  No/denies                       [facility] Adult PT Treatment/Exercise - 03/30/18 1400      Transfers   Transfers  Sit to Stand;Stand to Sit    Sit to Stand  5: Supervision;With upper extremity assist;With armrests;From chair/3-in-1   to RW, working on not touching walker to stabilize   Stand to Sit  5: Supervision;With upper extremity assist;With armrests;To chair/3-in-1   from RW, working on not using RW for stability     Ambulation/Gait   Ambulation/Gait  Yes    Ambulation/Gait Assistance  5: Supervision;4: Min assist    Ambulation/Gait Assistance Details  verbal  & tactile cues on balance with cane.  Vision & glancing not staring at floor to improve posture & balance with gait.  RW gait with fluency of RW movement & LEs.     Ambulation Distance (Feet)  100 Feet   100' X 2 RW and 10' X 6 & 75' X 1 SBQC   Assistive device  Rolling walker;Small based quad cane;Prosthesis    Gait Pattern  Step-through pattern;Decreased stride length;Narrow base of support;Trunk flexed;Decreased step length - right;Decreased stance time - right;Decreased weight shift to right;Decreased hip/knee flexion - right    Stairs  Yes    Stairs Assistance  5: Supervision    Stairs Assistance Details (indicate cue type and reason)  PT instructed in technique with single rail & SBQC with TFA prosthesis.     Stair Management Technique  One rail Right;One rail Left;With cane;Step to pattern;Forwards   varied location of single rail   Number of Stairs  4   3 reps varying rail location     Prosthetics   Current prosthetic wear tolerance (days/week)   daily    Current prosthetic wear tolerance (#hours/day)   most awake hours out of bed (average is from 11am to 5 pm).     Residual limb condition   no issues per pt report               PT Short Term Goals - 03/27/18 2228      PT SHORT TERM GOAL #1   Title  Patient sit to/from stand chairs without armrests to cane engaging prosthetic knee properly with supervision. (Target Date: 04/22/2018)    Baseline       Time  4    Period  Weeks    Status  Revised    Target Date  04/22/18      PT SHORT TERM GOAL #2   Title  Patient able to pick up 1# from floor with cane support with supervision.     Time  1    Period  Months    Status  New    Target Date  04/22/18      PT SHORT TERM GOAL #3   Title  Patient negotiates ramps, curbs with RW & stairs with 1 rail/cane with min guard.     Time  1    Period  Months    Status  New    Target Date  04/22/18      PT SHORT TERM GOAL #4   Title  Patient ambulates 36' with cane &  prosthesis with minA.     Baseline  Time  1    Period  Months    Status  New    Target Date  04/22/18        PT Long Term Goals - 03/23/18 2216      PT LONG TERM GOAL #1   Title  Patient verbalizes & demonstrates understanding of prosthetic care with new prosthesis to enable safe use. (Target Date: 07/20/2017)    Baseline        Time  3    Period  Months    Status  On-going    Target Date  05/20/18      PT LONG TERM GOAL #2   Title  Patient tolerates wear of new prosthesis >90% of awake hours without skin issues to enable safe use throughout her day.    Baseline        Time  3    Period  Months    Status  On-going    Target Date  05/20/18      PT LONG TERM GOAL #3   Title  Patient ambulates 400' with LRAD & new prosthesis modified independent for community mobility.     Baseline        Time  3    Period  Months    Status  On-going    Target Date  05/20/18      PT LONG TERM GOAL #4   Title  Patient negotiates ramps, curbs & stairs with LRAD & new prosthesis modified independent for community access.     Baseline        Time  3    Period  Months    Status  On-going    Target Date  05/20/18      PT LONG TERM GOAL #5   Title  Patient ambulates around furniture 100' with LRAD (single UE support) and new prosthesis carrying cup of water modified independent.    Baseline        Time  3    Period  Months    Status  On-going    Target Date  05/20/18      PT LONG TERM GOAL #6   Title  Berg Balance > 36/56 to indicate lower fall risk.    Baseline        Time  3    Period  Months    Status  On-going    Target Date  05/20/18            Plan - 03/30/18 1845    Clinical Impression Statement  Patient improved confidence & balance with prosthetic gait with [facility] with skilled instruction in technique. Instruction in cane placement on right side for sit to/from stand and transitioning to LUE for gait improves balance & indepedence.     Rehab Potential  Good    PT  Frequency  2x / week    PT Duration  Other (comment)   90 days (13 weeks)   PT Treatment/Interventions  ADLs/Self Care Home Management;DME Instruction;Gait training;Stair training;Functional mobility training;Therapeutic activities;Balance training;Therapeutic exercise;Neuromuscular re-education;Patient/family education;Prosthetic Training;Manual techniques;Canalith Repostioning;Vestibular    PT Next Visit Plan  continue to work on gait with [facility] with emphasis on knee control; work on balance activities that promote anterior and/or left lateral weight shifting.     Consulted and Agree with Plan of Care  Patient       Patient will benefit from skilled therapeutic intervention in order to improve the following deficits and impairments:  Abnormal gait, Decreased activity tolerance,  Decreased balance, Decreased endurance, Decreased mobility, Decreased range of motion, Impaired flexibility, Prosthetic Dependency, Postural dysfunction, Decreased strength  Visit Diagnosis: Other abnormalities of gait and mobility  Unsteadiness on feet  Muscle weakness (generalized)  History of falling     Problem List Patient Active Problem List   Diagnosis Date Noted  . Memory loss 11/23/2017  . Takotsubo cardiomyopathy   . Hypertensive heart disease   . Brain aneurysm 04/13/2016  . Dyslipidemia   . CAD in native artery   . PAC (premature atrial contraction) 08/14/2014  . PVC's (premature ventricular contractions) 08/14/2014  . Palpitations 11/02/2013  . Essential hypertension, benign 11/01/2013  . Peripheral vascular disease, unspecified (HCC) 11/01/2013    , PT, DPT 03/31/2018, 8:48 AM  [facility] [address] [place], [place], [address] Phone: [phone]   Fax:  [phone]  Name: ARDENA GANGL MRN: [id number] Date of Birth: Dec 01, 1940

## 2018-04-04 ENCOUNTER — Encounter: Payer: Self-pay | Admitting: Physical Therapy

## 2018-04-04 ENCOUNTER — Ambulatory Visit: Payer: Medicare Other | Admitting: Physical Therapy

## 2018-04-04 DIAGNOSIS — M25651 Stiffness of right hip, not elsewhere classified: Secondary | ICD-10-CM

## 2018-04-04 DIAGNOSIS — M6281 Muscle weakness (generalized): Secondary | ICD-10-CM

## 2018-04-04 DIAGNOSIS — R2681 Unsteadiness on feet: Secondary | ICD-10-CM

## 2018-04-04 DIAGNOSIS — R2689 Other abnormalities of gait and mobility: Secondary | ICD-10-CM | POA: Diagnosis not present

## 2018-04-04 DIAGNOSIS — Z9181 History of falling: Secondary | ICD-10-CM

## 2018-04-04 NOTE — Therapy (Signed)
[facility] [address] [place], [place], [address] Phone: [phone]   Fax:  [phone]  Physical Therapy Treatment  Patient Details  Name: Cindy NgShirley L Daloia MRN: [id number] Date of Birth: February 12, 1941 Referring Provider: Aldean BakerMarcus Duda, MD   Encounter Date: 04/04/2018  PT End of Session - 04/04/18 1453    Visit Number  13    Number of Visits  26    Date for PT Re-Evaluation  05/20/18    Authorization Type  [employer]HC Medicare & Medicaid    PT Start Time  1400    PT Stop Time  1440    PT Time Calculation (min)  40 min    Equipment Utilized During Treatment  Gait belt    Activity Tolerance  Patient tolerated treatment well    Behavior During Therapy  WFL for tasks assessed/performed       Past Medical History:  Diagnosis Date  . CAD in native artery    a. 04/2016 NSTEMI with cath showing 25% MCx, 75% DX1 lesion and moderate LVD 35-45% out of proportion to CAD and in a pattern of Takotsubo CM (no clear stressor)  . Dyslipidemia   . GERD (gastroesophageal reflux disease)   . Hypercholesteremia   . Hypertension   . Hypertensive heart disease    initially she was thought to have a HOCM variant by cath with increased LVOT gradient but echo showed only a 10mmHg gradient across the LVOT and SAM and cardiac MRI did not show any late gad enhancement and only a 5mmHg LVOT gradient with 15mm BSH.  There was some mild SAM and mild MR.  EF normal at 76%.    [name]. LVH (left ventricular hypertrophy)    a. cMRI 2018 - showing moderate Basal septal hypertrophy not classic for HOCM morphology with no delayed gadolium uptake to suggest myofibrillar disarray, + SAM with small LVOT gradient in regard to physiology and mild MR.  . NSTEMI (non-ST elevated myocardial infarction) (HCC) 04/2016   secondary to stress CM - Takotsubo CM  . PAC (premature atrial contraction)    Event monitors 10/2013 and 08/2014 showed NSR, ST, PACs, PVCs.   [name]. 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 Posterior communicating artery aneurysm    followed by Dr Venetia MaxonStern  . PVC's (premature ventricular contractions)    Event monitors 10/2013 and 08/2014 showed NSR, ST, PACs, PVCs.   . 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 ABDOMINAL HYSTERECTOMY    . 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 CARDIAC CATHETERIZATION N/A 04/21/2016   Procedure: Left Heart Cath and Coronary Angiography;  Surgeon: Corky CraftsJayadeep S Varanasi, MD;  [facility];  Service: Cardiovascular;  Laterality: N/A;  . IR GENERIC HISTORICAL  02/13/2016   IR RADIOLOGY PERIPHERAL GUIDED IV START 02/13/2016 Berdine DanceMichael Shick, MD [facility]  . IR GENERIC HISTORICAL  02/13/2016   IR US GUIDE VASC ACCESS LEFT 02/13/2016 Berdine DanceMichael Shick, MD [facility]    There were no vitals filed for this visit.  Subjective Assessment - 04/04/18 1400    Subjective  She picked grapes this weekend with walker on grass. 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     Pertinent History  R TFA, CAD, HTN, NSTEMI, PAD,     Limitations  Standing;Walking;House hold activities  Patient Stated Goals  She wants to learn to use new prosthesis to stand & walk better.     Currently in Pain?  No/denies                       [facility] Adult PT Treatment/Exercise - 04/04/18 1400      Transfers   Transfers  Sit to Stand;Stand to Sit    Sit to Stand  5: Supervision;With upper extremity assist;With armrests;From chair/3-in-1   to RW, working on not touching walker to stabilize   Sit to Stand Details (indicate cue type and reason)  demo & verbal cues on rollator walker technique & safety    Stand to Sit  5: Supervision;With upper extremity assist;With armrests;To chair/3-in-1   from RW, working on not using RW for stability   Stand to [employer] & verbal cues on  rollator walker technique & safety      Ambulation/Gait   Ambulation/Gait  Yes    Ambulation/Gait Assistance  5: Supervision    Ambulation/Gait Assistance Details  demo & verbal cues on rollator walker technique & safety;   verbal cues on SBQC posture, wt shift & unlocking prosthesis in terminal stance    Ambulation Distance (Feet)  100 Feet   100' rollator walker.  60' X 2 SBQC   Assistive device  Prosthesis;Rollator;Small based quad cane;Rolling walker    Gait Pattern  Step-through pattern;Decreased stride length;Narrow base of support;Trunk flexed;Decreased step length - right;Decreased stance time - right;Decreased weight shift to right;Decreased hip/knee flexion - right    Ambulation Surface  Level;Indoor    Stairs  --    Stairs Assistance  --    Stair Management Technique  --    Number of Stairs  --    Ramp  4: Min assist;3: Mod assist   modA /HHA SBQC & minA rollator with TFA prosthesis   Ramp Details (indicate cue type and reason)  demo & verbal cues on rollator walker technique & safety;   demo & verbal cues on prosthesis control & step length    Curb  4: Min assist;3: Mod assist   minA rollator & ModA / HHA SBQC   Curb Details (indicate cue type and reason)  demo & verbal cues on rollator walker technique & safety;        Self-Care   Other Self-Care Comments   PT instructed in using back pack to carry items like groceries in home.       Prosthetics   Current prosthetic wear tolerance (days/week)   daily    Current prosthetic wear tolerance (#hours/day)   most awake hours out of bed (average is from 11am to 5 pm).     Residual limb condition   no issues per pt report               PT Short Term Goals - 03/27/18 2228      PT SHORT TERM GOAL #1   Title  Patient sit to/from stand chairs without armrests to cane engaging prosthetic knee properly with supervision. (Target Date: 04/22/2018)    Baseline       Time  4    Period  Weeks    Status  Revised    Target Date   04/22/18      PT SHORT TERM GOAL #2   Title  Patient able to pick up 1# from floor with cane support with supervision.     Time  1  Period  Months    Status  New    Target Date  04/22/18      PT SHORT TERM GOAL #3   Title  Patient negotiates ramps, curbs with RW & stairs with 1 rail/cane with min guard.     Time  1    Period  Months    Status  New    Target Date  04/22/18      PT SHORT TERM GOAL #4   Title  Patient ambulates 34' with cane & prosthesis with minA.     Baseline        Time  1    Period  Months    Status  New    Target Date  04/22/18        PT Long Term Goals - 03/23/18 2216      PT LONG TERM GOAL #1   Title  Patient verbalizes & demonstrates understanding of prosthetic care with new prosthesis to enable safe use. (Target Date: 07/20/2017)    Baseline        Time  3    Period  Months    Status  On-going    Target Date  05/20/18      PT LONG TERM GOAL #2   Title  Patient tolerates wear of new prosthesis >90% of awake hours without skin issues to enable safe use throughout her day.    Baseline        Time  3    Period  Months    Status  On-going    Target Date  05/20/18      PT LONG TERM GOAL #3   Title  Patient ambulates 400' with LRAD & new prosthesis modified independent for community mobility.     Baseline        Time  3    Period  Months    Status  On-going    Target Date  05/20/18      PT LONG TERM GOAL #4   Title  Patient negotiates ramps, curbs & stairs with LRAD & new prosthesis modified independent for community access.     Baseline        Time  3    Period  Months    Status  On-going    Target Date  05/20/18      PT LONG TERM GOAL #5   Title  Patient ambulates around furniture 100' with LRAD (single UE support) and new prosthesis carrying cup of water modified independent.    Baseline        Time  3    Period  Months    Status  On-going    Target Date  05/20/18      PT LONG TERM GOAL #6   Title  Berg Balance > 36/56 to  indicate lower fall risk.    Baseline        Time  3    Period  Months    Status  On-going    Target Date  05/20/18            Plan - 04/04/18 1607    Clinical Impression Statement  Patient improved prosthetic gait with SBQC on level surfaces. Ramps & curbs were "scary" for patient so required increased assistance but should become more comfortable with additional practice. Rollator walker could improve community mobility with resting point with seat and ability to transport items like groceries.     Rehab Potential  Good  PT Frequency  2x / week    PT Duration  Other (comment)   90 days (13 weeks)   PT Treatment/Interventions  ADLs/Self Care Home Management;DME Instruction;Gait training;Stair training;Functional mobility training;Therapeutic activities;Balance training;Therapeutic exercise;Neuromuscular re-education;Patient/family education;Prosthetic Training;Manual techniques;Canalith Repostioning;Vestibular    PT Next Visit Plan  Ask if patient wants to continue to work on community gait with rollator walker.  continue to work on gait with [place] with emphasis on knee control; work on balance activities that promote anterior and/or left lateral weight shifting.     Consulted and Agree with Plan of Care  Patient       Patient will benefit from skilled therapeutic intervention in order to improve the following deficits and impairments:  Abnormal gait, Decreased activity tolerance, Decreased balance, Decreased endurance, Decreased mobility, Decreased range of motion, Impaired flexibility, Prosthetic Dependency, Postural dysfunction, Decreased strength  Visit Diagnosis: Other abnormalities of gait and mobility  Unsteadiness on feet  Muscle weakness (generalized)  History of falling  Stiffness of right hip, not elsewhere classified     Problem List Patient Active Problem List   Diagnosis Date Noted  . Memory loss 11/23/2017  . Takotsubo cardiomyopathy   . Hypertensive  heart disease   . Brain aneurysm 04/13/2016  . Dyslipidemia   . CAD in native artery   . PAC (premature atrial contraction) 08/14/2014  . PVC's (premature ventricular contractions) 08/14/2014  . Palpitations 11/02/2013  . Essential hypertension, benign 11/01/2013  . Peripheral vascular disease, unspecified (HCC) 11/01/2013    , PT, DPT 04/04/2018, 4:16 PM  [facility] [address] [place], [place], [address] Phone: [phone]   Fax:  [phone]  Name: MALAYAH DEMURO MRN: [id number] Date of Birth: [birth year]

## 2018-04-06 ENCOUNTER — Ambulatory Visit: Payer: Medicare Other | Admitting: Physical Therapy

## 2018-04-06 ENCOUNTER — Encounter: Payer: Self-pay | Admitting: Physical Therapy

## 2018-04-06 DIAGNOSIS — R2681 Unsteadiness on feet: Secondary | ICD-10-CM

## 2018-04-06 DIAGNOSIS — R2689 Other abnormalities of gait and mobility: Secondary | ICD-10-CM | POA: Diagnosis not present

## 2018-04-06 DIAGNOSIS — M6281 Muscle weakness (generalized): Secondary | ICD-10-CM

## 2018-04-07 NOTE — Therapy (Signed)
[facility] [address] [place], [place], [address] Phone: [phone]   Fax:  [phone]  Physical Therapy Treatment  Patient Details  Name: [name] MRN: [id number] Date of Birth: 1940-10-18 Referring Provider (PT): Aldean Baker, MD   Encounter Date: 04/06/2018  PT End of Session - 04/06/18 1406    Visit Number  14    Number of Visits  26    Date for PT Re-Evaluation  05/20/18    Authorization Type  [employer] Medicare & Medicaid    PT Start Time  1405    PT Stop Time  1444    PT Time Calculation (min)  39 min    Equipment Utilized During Treatment  Gait belt    Activity Tolerance  Patient tolerated treatment well    Behavior During Therapy  WFL for tasks assessed/performed       Past Medical History:  Diagnosis Date  . CAD in native artery    a. 04/2016 NSTEMI with cath showing 25% MCx, 75% DX1 lesion and moderate LVD 35-45% out of proportion to CAD and in a pattern of Takotsubo CM (no clear stressor)  . Dyslipidemia   . GERD (gastroesophageal reflux disease)   . Hypercholesteremia   . Hypertension   . Hypertensive heart disease    initially she was thought to have a HOCM variant by cath with increased LVOT gradient but echo showed only a gradient across the LVOT and SAM and cardiac MRI did not show any late gad enhancement and only a LVOT gradient with 15mm BSH.  There was some mild SAM and mild MR.  EF normal at 76%.    [name] LVH (left ventricular hypertrophy)    a. cMRI 2018 - showing moderate Basal septal hypertrophy not classic for HOCM morphology with no delayed gadolium uptake to suggest myofibrillar disarray, + SAM with small LVOT gradient in regard to physiology and mild MR.  . NSTEMI (non-ST elevated myocardial infarction) (HCC) 04/2016   secondary to stress CM - Takotsubo CM  . PAC (premature atrial contraction)    Event monitors 10/2013 and 08/2014 showed NSR, ST, PACs, PVCs.   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 Posterior communicating artery aneurysm    followed by Dr Venetia Maxon  . PVC's (premature ventricular contractions)    Event monitors 10/2013 and 08/2014 showed NSR, ST, PACs, PVCs.   . 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 ABDOMINAL HYSTERECTOMY    . 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 CARDIAC CATHETERIZATION N/A 04/21/2016   Procedure: Left Heart Cath and Coronary Angiography;  Surgeon: Corky Crafts, MD;  Location: [facility] INVASIVE CV LAB;  Service: Cardiovascular;  Laterality: N/A;  . IR GENERIC HISTORICAL  02/13/2016   IR RADIOLOGY PERIPHERAL GUIDED IV START 02/13/2016 Berdine Dance, MD [facility]INTER[facility]  . IR GENERIC HISTORICAL  02/13/2016   IR US GUIDE VASC ACCESS LEFT 02/13/2016 Berdine Dance, MD [facility]    There were no vitals filed for this visit.  Subjective Assessment - 04/06/18 1405    Subjective  No new complaints. No falls or pain. Using the small base quad cane "sometimes" in the home.     Pertinent History  R TFA, CAD, HTN, NSTEMI, PAD,     Limitations  Standing;Walking;House hold activities    Patient Stated Goals  She wants to learn to use new prosthesis to stand & walk better.     Currently in Pain?  No/denies           [facility] Adult PT Treatment/Exercise - 04/06/18 1408      Transfers   Transfers  Sit to Stand;Stand to Sit    Sit to Stand  5: Supervision;With upper extremity assist;With armrests;From chair/3-in-1    Sit to Stand Details (indicate cue type and reason)  needed UE support to stabilize with standing    Stand to Sit  5: Supervision;With upper extremity assist;With armrests;To chair/3-in-1    Stand to Sit Details  needed UE support for stability with sitting down      Ambulation/Gait   Ambulation/Gait  Yes    Ambulation/Gait Assistance  5:  Supervision;4: Min guard    Ambulation/Gait Assistance Details  into/out of gym with RW with supervision/cues for prosthetic knee flexion with swing phase at times; 250 feet x2 with rollator on indoor/outdoor surfaces with seated rest break on rollator midway. cues/min guard assist on use of brakes on inclines/declines and on technique with turring to/from sitting; 120 feet at end of session with small based quad cane with min assit progressing to min guard assist with cues on step length, posture and weight shifting.     Ambulation Distance (Feet)  100 Feet   x2, 120 x1, 250 x2   Assistive device  Rolling walker;Rollator;Prosthesis    Gait Pattern  Step-through pattern;Decreased stride length;Narrow base of support;Trunk flexed;Decreased step length - right;Decreased stance time - right;Decreased weight shift to right;Decreased hip/knee flexion - right    Ambulation Surface  Level;Indoor;Unlevel;Outdoor;Paved    Ramp  Other (comment)   min guard assist   Ramp Details (indicate cue type and reason)  with rollator/prosthesis on outdoor inclines/declines. cues for use of brakes to slow rollator down     Curb  Other (comment)   min guard assist   Curb Details (indicate cue type and reason)  with rollator/prosthesis on outdoor curb with cues on sequencing and technique.       Prosthetics   Current prosthetic wear tolerance (days/week)   daily    Current prosthetic wear tolerance (#hours/day)   most awake hours out of bed (average is from 11am to 5 pm).     Residual limb condition   no issues per pt report         PT Short Term Goals - 03/27/18 2228      PT SHORT TERM GOAL #1   Title  Patient sit to/from stand chairs without armrests to cane engaging prosthetic knee properly with supervision. (Target Date: 04/22/2018)    Baseline       Time  4    Period  Weeks    Status  Revised    Target Date  04/22/18      PT SHORT TERM GOAL #2   Title  Patient able to pick up 1# from floor with cane  support with supervision.     Time  1    Period  Months    Status  New    Target Date  04/22/18      PT SHORT TERM GOAL #3   Title  Patient negotiates ramps, curbs with RW & stairs with 1 rail/cane with min guard.     Time  1    Period  Months    Status  New  Target Date  04/22/18      PT SHORT TERM GOAL #4   Title  Patient ambulates 69' with cane & prosthesis with minA.     Baseline        Time  1    Period  Months    Status  New    Target Date  04/22/18        PT Long Term Goals - 03/23/18 2216      PT LONG TERM GOAL #1   Title  Patient verbalizes & demonstrates understanding of prosthetic care with new prosthesis to enable safe use. (Target Date: 07/20/2017)    Baseline        Time  3    Period  Months    Status  On-going    Target Date  05/20/18      PT LONG TERM GOAL #2   Title  Patient tolerates wear of new prosthesis >90% of awake hours without skin issues to enable safe use throughout her day.    Baseline        Time  3    Period  Months    Status  On-going    Target Date  05/20/18      PT LONG TERM GOAL #3   Title  Patient ambulates 400' with LRAD & new prosthesis modified independent for community mobility.     Baseline        Time  3    Period  Months    Status  On-going    Target Date  05/20/18      PT LONG TERM GOAL #4   Title  Patient negotiates ramps, curbs & stairs with LRAD & new prosthesis modified independent for community access.     Baseline        Time  3    Period  Months    Status  On-going    Target Date  05/20/18      PT LONG TERM GOAL #5   Title  Patient ambulates around furniture 100' with LRAD (single UE support) and new prosthesis carrying cup of water modified independent.    Baseline        Time  3    Period  Months    Status  On-going    Target Date  05/20/18      PT LONG TERM GOAL #6   Title  Berg Balance > 36/56 to indicate lower fall risk.    Baseline        Time  3    Period  Months    Status  On-going     Target Date  05/20/18         Plan - 04/06/18 1407    Clinical Impression Statement  Today's skilled session continued to address gait with rollator for community distances and small based quad cane for short, household distances. Pt progressed to supervision with rollator with cues safe use. Continues to need up to min assist for gait with cane. The pt is progressing toward goals and should benefit from continued PT to progress toward unmet goals.     Rehab Potential  Good    PT Frequency  2x / week    PT Duration  Other (comment)   90 days (13 weeks)   PT Treatment/Interventions  ADLs/Self Care Home Management;DME Instruction;Gait training;Stair training;Functional mobility training;Therapeutic activities;Balance training;Therapeutic exercise;Neuromuscular re-education;Patient/family education;Prosthetic Training;Manual techniques;Canalith Repostioning;Vestibular    PT Next Visit Plan   continue to work on gait  with [facility] with emphasis on knee control; work on balance activities that promote anterior and/or left lateral weight shifting, rollator on outdoor surfaces/barriers     Consulted and Agree with Plan of Care  Patient       Patient will benefit from skilled therapeutic intervention in order to improve the following deficits and impairments:  Abnormal gait, Decreased activity tolerance, Decreased balance, Decreased endurance, Decreased mobility, Decreased range of motion, Impaired flexibility, Prosthetic Dependency, Postural dysfunction, Decreased strength  Visit Diagnosis: Other abnormalities of gait and mobility  Unsteadiness on feet  Muscle weakness (generalized)     Problem List Patient Active Problem List   Diagnosis Date Noted  . Memory loss 11/23/2017  . Takotsubo cardiomyopathy   . Hypertensive heart disease   . Brain aneurysm 04/13/2016  . Dyslipidemia   . CAD in native artery   . PAC (premature atrial contraction) 08/14/2014  . PVC's (premature ventricular  contractions) 08/14/2014  . Palpitations 11/02/2013  . Essential hypertension, benign 11/01/2013  . Peripheral vascular disease, unspecified (HCC) 11/01/2013    Sallyanne Kuster, PTA, [facility] [address] [place], [place] [address] [phone] 04/07/18, 2:36 PM   Name: [name] MRN: [id number] Date of Birth: 1940-08-26

## 2018-04-11 ENCOUNTER — Encounter: Payer: Self-pay | Admitting: Physical Therapy

## 2018-04-11 ENCOUNTER — Ambulatory Visit: Payer: Medicare Other | Admitting: Physical Therapy

## 2018-04-11 DIAGNOSIS — R2681 Unsteadiness on feet: Secondary | ICD-10-CM

## 2018-04-11 DIAGNOSIS — M6281 Muscle weakness (generalized): Secondary | ICD-10-CM

## 2018-04-11 DIAGNOSIS — Z9181 History of falling: Secondary | ICD-10-CM

## 2018-04-11 DIAGNOSIS — R2689 Other abnormalities of gait and mobility: Secondary | ICD-10-CM

## 2018-04-11 NOTE — Therapy (Signed)
[facility] [address] [place], [place], [address] Phone: [phone]   Fax:  [phone]  Physical Therapy Treatment  Patient Details  Name: [name] MRN: [id number] Date of Birth: 20-Jun-1941 Referring Provider (PT): Aldean Baker, MD   Encounter Date: 04/11/2018  PT End of Session - 04/11/18 2107    Visit Number  15    Number of Visits  26    Date for PT Re-Evaluation  05/20/18    Authorization Type  [employer]HC Medicare & Medicaid    PT Start Time  1400    PT Stop Time  1445    PT Time Calculation (min)  45 min    Equipment Utilized During Treatment  Gait belt    Activity Tolerance  Patient tolerated treatment well    Behavior During Therapy  WFL for tasks assessed/performed       Past Medical History:  Diagnosis Date  . CAD in native artery    a. 04/2016 NSTEMI with cath showing 25% MCx, 75% DX1 lesion and moderate LVD 35-45% out of proportion to CAD and in a pattern of Takotsubo CM (no clear stressor)  . Dyslipidemia   . GERD (gastroesophageal reflux disease)   . Hypercholesteremia   . Hypertension   . Hypertensive heart disease    initially she was thought to have a HOCM variant by cath with increased LVOT gradient but echo showed only a gradient across the LVOT and SAM and cardiac MRI did not show any late gad enhancement and only a LVOT gradient with 15mm BSH.  There was some mild SAM and mild MR.  EF normal at 76%.    [name] LVH (left ventricular hypertrophy)    a. cMRI 2018 - showing moderate Basal septal hypertrophy not classic for HOCM morphology with no delayed gadolium uptake to suggest myofibrillar disarray, + SAM with small LVOT gradient in regard to physiology and mild MR.  . NSTEMI (non-ST elevated myocardial infarction) (HCC) 04/2016   secondary to stress CM - Takotsubo CM  . PAC (premature atrial contraction)    Event monitors 10/2013 and 08/2014 showed NSR, ST, PACs, PVCs.   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 Posterior communicating artery aneurysm    followed by Dr Venetia Maxon  . PVC's (premature ventricular contractions)    Event monitors 10/2013 and 08/2014 showed NSR, ST, PACs, PVCs.   . 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 ABDOMINAL HYSTERECTOMY    . 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 CARDIAC CATHETERIZATION N/A 04/21/2016   Procedure: Left Heart Cath and Coronary Angiography;  Surgeon: Corky Crafts, MD;  Location: [facility] INVASIVE CV LAB;  Service: Cardiovascular;  Laterality: N/A;  . IR GENERIC HISTORICAL  02/13/2016   IR RADIOLOGY PERIPHERAL GUIDED IV START 02/13/2016 Berdine Dance, MD [facility]  . IR GENERIC HISTORICAL  02/13/2016   IR US GUIDE VASC ACCESS LEFT 02/13/2016 Berdine Dance, MD [facility]    There were no vitals filed for this visit.  Subjective Assessment - 04/11/18 1400    Subjective  She had a congestion over weekend but no fever or other symptoms. So did not do much walking.     Pertinent History  R TFA, CAD, HTN, NSTEMI, PAD,  Limitations  Standing;Walking;House hold activities    Patient Stated Goals  She wants to learn to use new prosthesis to stand & walk better.     Currently in Pain?  No/denies                       [facility] Adult PT Treatment/Exercise - 04/11/18 1400      Transfers   Transfers  Sit to Stand;Stand to Sit    Sit to Stand  5: Supervision;With upper extremity assist;With armrests;From chair/3-in-1   to [facility] & rollator walker   Sit to Stand Details  Verbal cues for safe use of DME/AE;Verbal cues for technique    Stand to Sit  5: Supervision;With upper extremity assist;To chair/3-in-1   from [facility] & rollator walker   Stand to Sit Details (indicate cue type and reason)  Verbal cues for technique;Verbal cues for safe use of  DME/AE      Ambulation/Gait   Ambulation/Gait  Yes    Ambulation/Gait Assistance  5: Supervision;4: Min guard    Ambulation/Gait Assistance Details  verbal cues on rollator walker position/upright posture and brake management.     Ambulation Distance (Feet)  200 Feet   30', 35' & 41' SBQC and  200' X 2 rollator   Assistive device  Rollator;Prosthesis;Small based quad cane    Gait Pattern  Step-through pattern;Decreased stride length;Narrow base of support;Trunk flexed;Decreased step length - right;Decreased stance time - right;Decreased weight shift to right;Decreased hip/knee flexion - right    Ambulation Surface  Indoor;Level;Outdoor;Paved    Ramp  5: Supervision   rollator walker & TFA prosthesis   Ramp Details (indicate cue type and reason)  verbal cues on rollator walker technique     Curb  5: Supervision   rollator walker & TFA Prosthesis   Curb Details (indicate cue type and reason)  verbal cues on rollator walker technique       High Level Balance   High Level Balance Activities  Negotitating around obstacles   ambulating around furniture     Self-Care   Self-Care  ADL's    ADL's  carrying groceries using hooks on RW & backpack. Pt ambulated 150' with RW with 7# in backpack & carrying single bag of groceries in each hand.       Prosthetics   Current prosthetic wear tolerance (days/week)   daily    Current prosthetic wear tolerance (#hours/day)   most awake hours out of bed (average is from 11am to 5 pm).     Residual limb condition   no issues per pt report               PT Short Term Goals - 03/27/18 2228      PT SHORT TERM GOAL #1   Title  Patient sit to/from stand chairs without armrests to cane engaging prosthetic knee properly with supervision. (Target Date: 04/22/2018)    Baseline       Time  4    Period  Weeks    Status  Revised    Target Date  04/22/18      PT SHORT TERM GOAL #2   Title  Patient able to pick up 1# from floor with cane support with  supervision.     Time  1    Period  Months    Status  New    Target Date  04/22/18      PT SHORT TERM GOAL #3   Title  Patient  negotiates ramps, curbs with RW & stairs with 1 rail/cane with min guard.     Time  1    Period  Months    Status  New    Target Date  04/22/18      PT SHORT TERM GOAL #4   Title  Patient ambulates 106' with cane & prosthesis with minA.     Baseline        Time  1    Period  Months    Status  New    Target Date  04/22/18        PT Long Term Goals - 03/23/18 2216      PT LONG TERM GOAL #1   Title  Patient verbalizes & demonstrates understanding of prosthetic care with new prosthesis to enable safe use. (Target Date: 07/20/2017)    Baseline        Time  3    Period  Months    Status  On-going    Target Date  05/20/18      PT LONG TERM GOAL #2   Title  Patient tolerates wear of new prosthesis >90% of awake hours without skin issues to enable safe use throughout her day.    Baseline        Time  3    Period  Months    Status  On-going    Target Date  05/20/18      PT LONG TERM GOAL #3   Title  Patient ambulates 400' with LRAD & new prosthesis modified independent for community mobility.     Baseline        Time  3    Period  Months    Status  On-going    Target Date  05/20/18      PT LONG TERM GOAL #4   Title  Patient negotiates ramps, curbs & stairs with LRAD & new prosthesis modified independent for community access.     Baseline        Time  3    Period  Months    Status  On-going    Target Date  05/20/18      PT LONG TERM GOAL #5   Title  Patient ambulates around furniture 100' with LRAD (single UE support) and new prosthesis carrying cup of water modified independent.    Baseline        Time  3    Period  Months    Status  On-going    Target Date  05/20/18      PT LONG TERM GOAL #6   Title  Berg Balance > 36/56 to indicate lower fall risk.    Baseline        Time  3    Period  Months    Status  On-going    Target Date   05/20/18            Plan - 04/11/18 2108    Clinical Impression Statement  Patient is on target to meet STGs next week. She would benefit from a rollator walker to improve prosthetic gait in community with seat to enable to rest.     Rehab Potential  Good    PT Frequency  2x / week    PT Duration  Other (comment)   90 days (13 weeks)   PT Treatment/Interventions  ADLs/Self Care Home Management;DME Instruction;Gait training;Stair training;Functional mobility training;Therapeutic activities;Balance training;Therapeutic exercise;Neuromuscular re-education;Patient/family education;Prosthetic Training;Manual techniques;Canalith Repostioning;Vestibular    PT Next Visit Plan  continue to work on gait with [facility] with emphasis on knee control; work on balance activities that promote anterior and/or left lateral weight shifting, rollator on outdoor surfaces/barriers     Consulted and Agree with Plan of Care  Patient       Patient will benefit from skilled therapeutic intervention in order to improve the following deficits and impairments:  Abnormal gait, Decreased activity tolerance, Decreased balance, Decreased endurance, Decreased mobility, Decreased range of motion, Impaired flexibility, Prosthetic Dependency, Postural dysfunction, Decreased strength  Visit Diagnosis: Other abnormalities of gait and mobility  Unsteadiness on feet  Muscle weakness (generalized)  History of falling     Problem List Patient Active Problem List   Diagnosis Date Noted  . Memory loss 11/23/2017  . Takotsubo cardiomyopathy   . Hypertensive heart disease   . Brain aneurysm 04/13/2016  . Dyslipidemia   . CAD in native artery   . PAC (premature atrial contraction) 08/14/2014  . PVC's (premature ventricular contractions) 08/14/2014  . Palpitations 11/02/2013  . Essential hypertension, benign 11/01/2013  . Peripheral vascular disease, unspecified (HCC) 11/01/2013    , PT, DPT 04/11/2018,  9:11 PM  [facility] [address] [place], [place], [address] Phone: [phone]   Fax:  [phone]  Name: [name] MRN: [id number] Date of Birth: 05/09/1941

## 2018-04-13 ENCOUNTER — Ambulatory Visit: Payer: Medicare Other | Attending: Orthopedic Surgery | Admitting: Physical Therapy

## 2018-04-13 DIAGNOSIS — Z9181 History of falling: Secondary | ICD-10-CM | POA: Insufficient documentation

## 2018-04-13 DIAGNOSIS — M25651 Stiffness of right hip, not elsewhere classified: Secondary | ICD-10-CM | POA: Insufficient documentation

## 2018-04-13 DIAGNOSIS — M6281 Muscle weakness (generalized): Secondary | ICD-10-CM | POA: Insufficient documentation

## 2018-04-13 DIAGNOSIS — R2681 Unsteadiness on feet: Secondary | ICD-10-CM | POA: Insufficient documentation

## 2018-04-13 DIAGNOSIS — R2689 Other abnormalities of gait and mobility: Secondary | ICD-10-CM | POA: Insufficient documentation

## 2018-04-18 ENCOUNTER — Ambulatory Visit: Payer: Medicare Other | Admitting: Physical Therapy

## 2018-04-18 ENCOUNTER — Encounter: Payer: Self-pay | Admitting: Physical Therapy

## 2018-04-18 DIAGNOSIS — R2681 Unsteadiness on feet: Secondary | ICD-10-CM | POA: Diagnosis present

## 2018-04-18 DIAGNOSIS — R2689 Other abnormalities of gait and mobility: Secondary | ICD-10-CM

## 2018-04-18 DIAGNOSIS — Z9181 History of falling: Secondary | ICD-10-CM | POA: Diagnosis present

## 2018-04-18 DIAGNOSIS — M6281 Muscle weakness (generalized): Secondary | ICD-10-CM | POA: Diagnosis present

## 2018-04-18 DIAGNOSIS — M25651 Stiffness of right hip, not elsewhere classified: Secondary | ICD-10-CM | POA: Diagnosis present

## 2018-04-19 NOTE — Therapy (Signed)
[facility] [address] [place], [place], [address] Phone: [phone]   Fax:  [phone]  Physical Therapy Treatment  Patient Details  Name: [name] MRN: [id number] Date of Birth: [birth year] Referring Provider (PT): Meridee Score, MD   Encounter Date: 04/18/2018  PT End of Session - 04/18/18 1409    Visit Number  16    Number of Visits  26    Date for PT Re-Evaluation  05/20/18    Authorization Type  [employer] Medicare & Medicaid    PT Start Time  1405    PT Stop Time  1445    PT Time Calculation (min)  40 min    Equipment Utilized During Treatment  Gait belt    Activity Tolerance  Patient tolerated treatment well    Behavior During Therapy  WFL for tasks assessed/performed       Past Medical History:  Diagnosis Date  . CAD in native artery    a. 04/2016 NSTEMI with cath showing 25% MCx, 75% DX1 lesion and moderate LVD 35-45% out of proportion to CAD and in a pattern of Takotsubo CM (no clear stressor)  . Dyslipidemia   . GERD (gastroesophageal reflux disease)   . Hypercholesteremia   . Hypertension   . Hypertensive heart disease    initially she was thought to have a HOCM variant by cath with increased LVOT gradient but echo showed only a 56mHg gradient across the LVOT and SAM and cardiac MRI did not show any late gad enhancement and only a 545mg LVOT gradient with 1577mSH.  There was some mild SAM and mild MR.  EF normal at 76%.    . LMarland KitchenH (left ventricular hypertrophy)    a. cMRI 2018 - showing moderate Basal septal hypertrophy not classic for HOCM morphology with no delayed gadolium uptake to suggest myofibrillar disarray, + SAM with small LVOT gradient in regard to physiology and mild MR.  . NSTEMI (non-ST elevated myocardial infarction) ([facility]   secondary to stress CM - Takotsubo CM  . PAC (premature atrial contraction)    Event monitors 10/2013 and 08/2014 showed NSR, ST, PACs, PVCs.   . 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 Posterior communicating artery aneurysm    followed by Dr SteVertell Limber PVC's (premature ventricular contractions)    Event monitors 10/2013 and 08/2014 showed NSR, ST, PACs, PVCs.   . 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 ABDOMINAL HYSTERECTOMY    . 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 CARDIAC CATHETERIZATION N/A 04/21/2016   Procedure: Left Heart Cath and Coronary Angiography;  Surgeon: JayJettie BoozeD;  Location: [facility];  Service: Cardiovascular;  Laterality: N/A;  . IR GENERIC HISTORICAL  02/13/2016   IR RADIOLOGY PERIPHERAL GUIDED IV START 02/13/2016 MicGreggory KeenD [facility]  . IR GENERIC HISTORICAL  02/13/2016   IR US [place]IDE VASC ACCESS LEFT 02/13/2016 MicGreggory KeenD [facility]    There were no vitals filed for this visit.  Subjective Assessment - 04/18/18 1408    Subjective  No new complaints. No falls or pain to report.     Pertinent History  R TFA, CAD, HTN, NSTEMI, PAD,     Limitations  Standing;Walking;House hold activities    Patient  Stated Goals  She wants to learn to use new prosthesis to stand & walk better.     Currently in Pain?  No/denies            [facility] Adult PT Treatment/Exercise - 04/18/18 1411      Transfers   Transfers  Sit to Stand;Stand to Sit    Sit to Stand  5: Supervision;With upper extremity assist;With armrests;From chair/3-in-1    Sit to Stand Details  Verbal cues for safe use of DME/AE;Verbal cues for technique    Stand to Sit  5: Supervision;With upper extremity assist;To chair/3-in-1    Stand to Sit Details (indicate cue type and reason)  Verbal cues for technique;Verbal cues for safe use of DME/AE    Comments  pt able to stand from chair with no armrests with supervision and reminder cues on technique (turn sideways in  chair to have a place to put hands). pt independent with moving cane to right hand so to reach back with left hand to chair (opposite side of prosthesis).       Ambulation/Gait   Ambulation/Gait  Yes    Ambulation/Gait Assistance  5: Supervision    Ambulation Distance (Feet)  350 Feet   x1 with rollator; 70 x2 with cane   Assistive device  Rollator;Prosthesis;Small based quad cane    Gait Pattern  Step-through pattern;Decreased stride length;Narrow base of support;Trunk flexed;Decreased step length - right;Decreased stance time - right;Decreased weight shift to right;Decreased hip/knee flexion - right    Ambulation Surface  Level;Unlevel;Indoor;Outdoor;Paved    Stairs  Yes    Stairs Assistance  5: Supervision    Stairs Assistance Details (indicate cue type and reason)  reminder cues to flip cane around with descending so not to trip over feet of small base quad cane facing into her, otherwise no cues needed.     Stair Management Technique  One rail Right;One rail Left;Step to pattern;Forwards;With cane    Number of Stairs  4    Height of Stairs  6    Ramp  5: Supervision    Ramp Details (indicate cue type and reason)  with rollator/prosthesis on outdoor inclines/declines with cues on brake use on rollator    Curb  5: Supervision    Curb Details (indicate cue type and reason)  with prosthesis/rollator on outdoor curb x1 rep with cues on brake use on rollator.       Therapeutic Activites    Therapeutic Activities  Lifting    Lifting  with small base quad cane: pt able to reach down to pick up 1# hand weight and right self with supervision      Prosthetics   Current prosthetic wear tolerance (days/week)   daily    Current prosthetic wear tolerance (#hours/day)   most awake hours out of bed (average is from 11am to 5 pm).     Residual limb condition   no issues per pt report          PT Short Term Goals - 04/18/18 1409      PT SHORT TERM GOAL #1   Title  Patient sit to/from stand  chairs without armrests to cane engaging prosthetic knee properly with supervision. (Target Date: 04/22/2018)    Baseline  04/18/18: met today    Time  --    Period  --    Status  Achieved      PT SHORT TERM GOAL #2   Title  Patient able to pick up 1# from  floor with cane support with supervision.     Baseline  04/18/18: met today    Time  --    Period  --    Status  Achieved      PT SHORT TERM GOAL #3   Title  Patient negotiates ramps, curbs with RW & stairs with 1 rail/cane with min guard.     Baseline  04/18/18: met today    Period  --    Status  Achieved      PT SHORT TERM GOAL #4   Title  Patient ambulates 65' with cane & prosthesis with minA.     Baseline  04/18/18: met today    Time  --    Period  --    Status  Achieved        PT Long Term Goals - 03/23/18 2216      PT LONG TERM GOAL #1   Title  Patient verbalizes & demonstrates understanding of prosthetic care with new prosthesis to enable safe use. (Target Date: 07/20/2017)    Baseline        Time  3    Period  Months    Status  On-going    Target Date  05/20/18      PT LONG TERM GOAL #2   Title  Patient tolerates wear of new prosthesis >90% of awake hours without skin issues to enable safe use throughout her day.    Baseline        Time  3    Period  Months    Status  On-going    Target Date  05/20/18      PT LONG TERM GOAL #3   Title  Patient ambulates 400' with LRAD & new prosthesis modified independent for community mobility.     Baseline        Time  3    Period  Months    Status  On-going    Target Date  05/20/18      PT LONG TERM GOAL #4   Title  Patient negotiates ramps, curbs & stairs with LRAD & new prosthesis modified independent for community access.     Baseline        Time  3    Period  Months    Status  On-going    Target Date  05/20/18      PT LONG TERM GOAL #5   Title  Patient ambulates around furniture 100' with LRAD (single UE support) and new prosthesis carrying cup of water  modified independent.    Baseline        Time  3    Period  Months    Status  On-going    Target Date  05/20/18      PT LONG TERM GOAL #6   Title  Berg Balance > 36/56 to indicate lower fall risk.    Baseline        Time  3    Period  Months    Status  On-going    Target Date  05/20/18            Plan - 04/18/18 1409    Clinical Impression Statement  Today's skilled session focused on progress toward STGs with all goals met today. The pt is progressing well toward goals and should benefit from continued PT to progress toward unmet goals.     Rehab Potential  Good    PT Frequency  2x / week    PT  Duration  Other (comment)   90 days (13 weeks)   PT Treatment/Interventions  ADLs/Self Care Home Management;DME Instruction;Gait training;Stair training;Functional mobility training;Therapeutic activities;Balance training;Therapeutic exercise;Neuromuscular re-education;Patient/family education;Prosthetic Training;Manual techniques;Canalith Repostioning;Vestibular    PT Next Visit Plan   continue to work on gait with [facility] with emphasis on knee control; work on balance activities that promote anterior and/or left lateral weight shifting, rollator on outdoor surfaces/barriers     Consulted and Agree with Plan of Care  Patient       Patient will benefit from skilled therapeutic intervention in order to improve the following deficits and impairments:  Abnormal gait, Decreased activity tolerance, Decreased balance, Decreased endurance, Decreased mobility, Decreased range of motion, Impaired flexibility, Prosthetic Dependency, Postural dysfunction, Decreased strength  Visit Diagnosis: Other abnormalities of gait and mobility  Unsteadiness on feet  Muscle weakness (generalized)     Problem List Patient Active Problem List   Diagnosis Date Noted  . Memory loss 11/23/2017  . Takotsubo cardiomyopathy   . Hypertensive heart disease   . Brain aneurysm 04/13/2016  . Dyslipidemia   .  CAD in native artery   . PAC (premature atrial contraction) 08/14/2014  . PVC's (premature ventricular contractions) 08/14/2014  . Palpitations 11/02/2013  . Essential hypertension, benign 11/01/2013  . Peripheral vascular disease, unspecified ([place]) 11/01/2013    Willow Ora, PTA, [facility] [address] [place],  [address] [phone] 04/19/18, 5:43 PM   Name: [name] MRN: [id number] Date of Birth: February 08, 1941

## 2018-04-20 ENCOUNTER — Encounter: Payer: Self-pay | Admitting: Physical Therapy

## 2018-04-20 ENCOUNTER — Ambulatory Visit: Payer: Medicare Other | Admitting: Physical Therapy

## 2018-04-20 DIAGNOSIS — Z9181 History of falling: Secondary | ICD-10-CM

## 2018-04-20 DIAGNOSIS — R2689 Other abnormalities of gait and mobility: Secondary | ICD-10-CM | POA: Diagnosis not present

## 2018-04-20 DIAGNOSIS — M6281 Muscle weakness (generalized): Secondary | ICD-10-CM

## 2018-04-20 DIAGNOSIS — R2681 Unsteadiness on feet: Secondary | ICD-10-CM

## 2018-04-20 DIAGNOSIS — M25651 Stiffness of right hip, not elsewhere classified: Secondary | ICD-10-CM

## 2018-04-20 NOTE — Therapy (Signed)
[facility] [address] [place], [place], [address] Phone: [phone]   Fax:  [phone]  Physical Therapy Treatment  Patient Details  Name: [name] MRN: [id number] Date of Birth: 1941/02/28 Referring Provider (PT): Meridee Score, MD   Encounter Date: 04/20/2018  PT End of Session - 04/20/18 1418    Visit Number  17    Number of Visits  26    Date for PT Re-Evaluation  05/20/18    Authorization Type  [employer]HC Medicare & Medicaid    PT Start Time  1400    PT Stop Time  1445    PT Time Calculation (min)  45 min    Activity Tolerance  Patient tolerated treatment well    Behavior During Therapy  [facility] for tasks assessed/performed       Past Medical History:  Diagnosis Date  . CAD in native artery    a. 04/2016 NSTEMI with cath showing 25% MCx, 75% DX1 lesion and moderate LVD 35-45% out of proportion to CAD and in a pattern of Takotsubo CM (no clear stressor)  . Dyslipidemia   . GERD (gastroesophageal reflux disease)   . Hypercholesteremia   . Hypertension   . Hypertensive heart disease    initially she was thought to have a HOCM variant by cath with increased LVOT gradient but echo showed only a 53mHg gradient across the LVOT and SAM and cardiac MRI did not show any late gad enhancement and only a 578mg LVOT gradient with 1549mSH.  There was some mild SAM and mild MR.  EF normal at 76%.    . LMarland KitchenH (left ventricular hypertrophy)    a. cMRI 2018 - showing moderate Basal septal hypertrophy not classic for HOCM morphology with no delayed gadolium uptake to suggest myofibrillar disarray, + SAM with small LVOT gradient in regard to physiology and mild MR.  . NSTEMI (non-ST elevated myocardial infarction) ([facility]/2017   secondary to stress CM - Takotsubo CM  . PAC (premature atrial contraction)    Event monitors 10/2013 and 08/2014 showed NSR, ST, PACs, PVCs.   . 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 Posterior communicating artery aneurysm    followed by Dr SteVertell Limber PVC's (premature ventricular contractions)    Event monitors 10/2013 and 08/2014 showed NSR, ST, PACs, PVCs.   . 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 ABDOMINAL HYSTERECTOMY    . 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 CARDIAC CATHETERIZATION N/A 04/21/2016   Procedure: Left Heart Cath and Coronary Angiography;  Surgeon: JayJettie BoozeD;  Location: [facility];  Service: Cardiovascular;  Laterality: N/A;  . IR GENERIC HISTORICAL  02/13/2016   IR RADIOLOGY PERIPHERAL GUIDED IV START 02/13/2016 MicGreggory KeenD [facility]  . IR GENERIC HISTORICAL  02/13/2016   IR US [place]IDE VASC ACCESS LEFT 02/13/2016 MicGreggory KeenD [facility]    There were no vitals filed for this visit.  Subjective Assessment - 04/20/18 1413    Subjective  No compliaints; no falls;  stated she usually uses an electric cart at stores ; if not available she will just go slow and stand and rest.  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  Pertinent History  R TFA, CAD, HTN, NSTEMI, PAD,     Limitations  Standing;Walking;House hold activities    Currently in Pain?  No/denies                       [facility] Adult PT Treatment/Exercise - 04/20/18 0001      Ambulation/Gait   Ambulation/Gait  Yes    Ambulation/Gait Assistance  5: Supervision    Ambulation Distance (Feet)  230 Feet    Assistive device  4-wheeled walker    Gait Pattern  Step-through pattern    Ambulation Surface  Level;Indoor    Gait velocity  1.18 ft/sec      Exercises   Exercises  Knee/Hip      Knee/Hip Exercises: Aerobic   Nustep  15 min level 5  1 rest a 6 min                PT Short Term Goals - 04/18/18 1409      PT SHORT TERM GOAL #1   Title  Patient  sit to/from stand chairs without armrests to cane engaging prosthetic knee properly with supervision. (Target Date: 04/22/2018)    Baseline  04/18/18: met today    Time  --    Period  --    Status  Achieved      PT SHORT TERM GOAL #2   Title  Patient able to pick up 1# from floor with cane support with supervision.     Baseline  04/18/18: met today    Time  --    Period  --    Status  Achieved      PT SHORT TERM GOAL #3   Title  Patient negotiates ramps, curbs with RW & stairs with 1 rail/cane with min guard.     Baseline  04/18/18: met today    Period  --    Status  Achieved      PT SHORT TERM GOAL #4   Title  Patient ambulates 57' with cane & prosthesis with minA.     Baseline  04/18/18: met today    Time  --    Period  --    Status  Achieved        PT Long Term Goals - 03/23/18 2216      PT LONG TERM GOAL #1   Title  Patient verbalizes & demonstrates understanding of prosthetic care with new prosthesis to enable safe use. (Target Date: 07/20/2017)    Baseline        Time  3    Period  Months    Status  On-going    Target Date  05/20/18      PT LONG TERM GOAL #2   Title  Patient tolerates wear of new prosthesis >90% of awake hours without skin issues to enable safe use throughout her day.    Baseline        Time  3    Period  Months    Status  On-going    Target Date  05/20/18      PT LONG TERM GOAL #3   Title  Patient ambulates 400' with LRAD & new prosthesis modified independent for community mobility.     Baseline        Time  3    Period  Months    Status  On-going    Target Date  05/20/18      PT LONG TERM GOAL #4  Title  Patient negotiates ramps, curbs & stairs with LRAD & new prosthesis modified independent for community access.     Baseline        Time  3    Period  Months    Status  On-going    Target Date  05/20/18      PT LONG TERM GOAL #5   Title  Patient ambulates around furniture 100' with LRAD (single UE support) and new prosthesis carrying  cup of water modified independent.    Baseline        Time  3    Period  Months    Status  On-going    Target Date  05/20/18      PT LONG TERM GOAL #6   Title  Berg Balance > 36/56 to indicate lower fall risk.    Baseline        Time  3    Period  Months    Status  On-going    Target Date  05/20/18            Plan - 04/20/18 1426    Clinical Impression Statement  Pt has made great strides since initial eval ; walking with a good pattern; balance well with  FWW- progressing to less restrictive device; she's wearing the prosthesis the majority of the day; her pain is well managed;  functionally her activity tolerance is a main barrier; she needs a daily routine she can do to improve her endurance;  at 230' today (3'14sec) she had to stop and sit to recover ; would like to see her able to tolerated 15 min of continuous standing activity w/o excessive fatigue.  Utilized the [employer] today because there is one at the senior center she has access to ; had her do total of 15 min level 5 -monitored for excesive fatigue and insturcted on need for her to be consistent with HEP to improve her safety and function    Rehab Potential  Good    PT Frequency  2x / week    PT Treatment/Interventions  ADLs/Self Care Home Management;DME Instruction;Gait training;Stair training;Functional mobility training;Therapeutic activities;Balance training;Therapeutic exercise;Neuromuscular re-education;Patient/family education;Prosthetic Training;Manual techniques;Canalith Repostioning;Vestibular    PT Next Visit Plan  follow up with her plan for daily ther ex to improve her activity tolerance;        Patient will benefit from skilled therapeutic intervention in order to improve the following deficits and impairments:  Abnormal gait, Decreased activity tolerance, Decreased balance, Decreased endurance, Decreased mobility, Decreased range of motion, Impaired flexibility, Prosthetic Dependency, Postural dysfunction,  Decreased strength  Visit Diagnosis: Other abnormalities of gait and mobility  Unsteadiness on feet  Muscle weakness (generalized)  History of falling  Stiffness of right hip, not elsewhere classified     Problem List Patient Active Problem List   Diagnosis Date Noted  . Memory loss 11/23/2017  . Takotsubo cardiomyopathy   . Hypertensive heart disease   . Brain aneurysm 04/13/2016  . Dyslipidemia   . CAD in native artery   . PAC (premature atrial contraction) 08/14/2014  . PVC's (premature ventricular contractions) 08/14/2014  . Palpitations 11/02/2013  . Essential hypertension, benign 11/01/2013  . Peripheral vascular disease, unspecified ([place]) 11/01/2013    Rosaura Carpenter D PT DPT  04/20/2018, 4:58 PM  [facility] [address] [place], [place], [address] Phone: [phone]   Fax:  [phone]  Name: [name] MRN: [id number] Date of Birth: May 31, 1941

## 2018-04-25 ENCOUNTER — Ambulatory Visit: Payer: Medicare Other | Admitting: Physical Therapy

## 2018-04-25 ENCOUNTER — Encounter: Payer: Self-pay | Admitting: Physical Therapy

## 2018-04-25 DIAGNOSIS — R2689 Other abnormalities of gait and mobility: Secondary | ICD-10-CM

## 2018-04-25 DIAGNOSIS — R2681 Unsteadiness on feet: Secondary | ICD-10-CM

## 2018-04-25 DIAGNOSIS — M6281 Muscle weakness (generalized): Secondary | ICD-10-CM

## 2018-04-26 NOTE — Therapy (Signed)
[facility] [address] [place], [place], [address] Phone: [phone]   Fax:  [phone]  Physical Therapy Treatment  Patient Details  Name: [name] MRN: [id number] Date of Birth: 09-Dec-1940 Referring Provider (PT): Meridee Score, MD   Encounter Date: 04/25/2018  PT End of Session - 04/25/18 1408    Visit Number  18    Number of Visits  26    Date for PT Re-Evaluation  05/20/18    Authorization Type  [employer] Medicare & Medicaid    PT Start Time  1405    PT Stop Time  1445    PT Time Calculation (min)  40 min    Equipment Utilized During Treatment  Gait belt    Activity Tolerance  Patient tolerated treatment well;No increased pain    Behavior During Therapy  WFL for tasks assessed/performed       Past Medical History:  Diagnosis Date  . CAD in native artery    a. 04/2016 NSTEMI with cath showing 25% MCx, 75% DX1 lesion and moderate LVD 35-45% out of proportion to CAD and in a pattern of Takotsubo CM (no clear stressor)  . Dyslipidemia   . GERD (gastroesophageal reflux disease)   . Hypercholesteremia   . Hypertension   . Hypertensive heart disease    initially she was thought to have a HOCM variant by cath with increased LVOT gradient but echo showed only a 40mHg gradient across the LVOT and SAM and cardiac MRI did not show any late gad enhancement and only a 52mg LVOT gradient with 1510mSH.  There was some mild SAM and mild MR.  EF normal at 76%.    . LMarland KitchenH (left ventricular hypertrophy)    a. cMRI 2018 - showing moderate Basal septal hypertrophy not classic for HOCM morphology with no delayed gadolium uptake to suggest myofibrillar disarray, + SAM with small LVOT gradient in regard to physiology and mild MR.  . [place]STEMI (non-ST elevated myocardial infarction) [place]   secondary to stress CM - Takotsubo CM  . PAC (premature atrial contraction)    Event monitors 10/2013 and 08/2014 showed NSR, ST, PACs,  PVCs.   . 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 Posterior communicating artery aneurysm    followed by Dr SteVertell Limber PVC's (premature ventricular contractions)    Event monitors 10/2013 and 08/2014 showed NSR, ST, PACs, PVCs.   . 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 ABDOMINAL HYSTERECTOMY    . 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 CARDIAC CATHETERIZATION N/A 04/21/2016   Procedure: Left Heart Cath and Coronary Angiography;  Surgeon: JayJettie BoozeD;  Location: [facility];  Service: Cardiovascular;  Laterality: N/A;  . IR GENERIC HISTORICAL  02/13/2016   IR RADIOLOGY PERIPHERAL GUIDED IV START 02/13/2016 MicGreggory KeenD [facility]  . IR GENERIC HISTORICAL  02/13/2016   IR US [place]IDE VASC ACCESS LEFT 02/13/2016 MicGreggory KeenD [facility]    There were no vitals filed for this visit.  Subjective Assessment - 04/25/18 1408    Subjective  No new complaints. No falls. [facility], walked a lap and used the "cruch" bar. No issues reported.     Pertinent History  R TFA,  CAD, HTN, NSTEMI, PAD,     Limitations  Standing;Walking;House hold activities    Patient Stated Goals  She wants to learn to use new prosthesis to stand & walk better.     Currently in Pain?  No/denies              [facility] Adult PT Treatment/Exercise - 04/25/18 1409      Transfers   Transfers  Sit to Stand;Stand to Sit    Sit to Stand  5: Supervision;With upper extremity assist;With armrests;From chair/3-in-1    Sit to Stand Details (indicate cue type and reason)  reminder cues for brake safety with rollator    Stand to Sit  5: Supervision;With upper extremity assist;To chair/3-in-1    Stand to Sit Details  reminder cues to lock rollator prior to sitting down      Ambulation/Gait   Ambulation/Gait  Yes     Ambulation/Gait Assistance  5: Supervision    Ambulation/Gait Assistance Details  used her RW to enter/exit gym. 500 feet with rollator with supervision, reminder cues for hand placement on brakes and use of brakes with inclines/declines. cues for increased prosthetic knee flexion with swing phase as pt continues to "stiff leg" prosthesis advancement.     Ambulation Distance (Feet)  500 Feet   x1,    Assistive device  Rollator;Rolling walker    Gait Pattern  Step-to pattern;Decreased stride length;Decreased step length - right;Decreased stance time - left;Decreased hip/knee flexion - right;Trunk flexed;Narrow base of support    Ambulation Surface  Level;Indoor;Unlevel;Outdoor;Paved    Ramp  5: Supervision    Ramp Details (indicate cue type and reason)  on outdoor inclines/declines with rollator/prosthesis      High Level Balance   High Level Balance Activities  Side stepping;Backward walking    High Level Balance Comments  with single UE support on bar, min guard assist for balance. 3-4 laps each with cues on posture, weight shifting and ex form/technique.        Prosthetics   Current prosthetic wear tolerance (days/week)   daily    Current prosthetic wear tolerance (#hours/day)   most awake hours out of bed (average is from 11am to 5 pm).     Residual limb condition   no issues per pt report    Donning Prosthesis  Modified independent (device/increased time)    Doffing Prosthesis  Modified independent (device/increased time)          Balance Exercises - 04/25/18 1441      Balance Exercises: Standing   Rockerboard  Anterior/posterior;Lateral;EO;EC;30 seconds      Balance Exercises: Standing   Rebounder Limitations  performed both ways on balance board holding it steady: with EO alternating UEs raises, then bil UE raises with min guard to min assist, cues on posture/weight shifing to assist with balance.; with no UE support- EC no head movments, progressing to EC head movements  left<>right, then up<>down with min to mod assist for balance, cues on posture and weight shifting for balance.           PT Short Term Goals - 04/18/18 1409      PT SHORT TERM GOAL #1   Title  Patient sit to/from stand chairs without armrests to cane engaging prosthetic knee properly with supervision. (Target Date: 04/22/2018)    Baseline  04/18/18: met today    Time  --    Period  --    Status  Achieved      PT SHORT TERM GOAL #  2   Title  Patient able to pick up 1# from floor with cane support with supervision.     Baseline  04/18/18: met today    Time  --    Period  --    Status  Achieved      PT SHORT TERM GOAL #3   Title  Patient negotiates ramps, curbs with RW & stairs with 1 rail/cane with min guard.     Baseline  04/18/18: met today    Period  --    Status  Achieved      PT SHORT TERM GOAL #4   Title  Patient ambulates 59' with cane & prosthesis with minA.     Baseline  04/18/18: met today    Time  --    Period  --    Status  Achieved        PT Long Term Goals - 03/23/18 2216      PT LONG TERM GOAL #1   Title  Patient verbalizes & demonstrates understanding of prosthetic care with new prosthesis to enable safe use. (Target Date: 07/20/2017)    Baseline        Time  3    Period  Months    Status  On-going    Target Date  05/20/18      PT LONG TERM GOAL #2   Title  Patient tolerates wear of new prosthesis >90% of awake hours without skin issues to enable safe use throughout her day.    Baseline        Time  3    Period  Months    Status  On-going    Target Date  05/20/18      PT LONG TERM GOAL #3   Title  Patient ambulates 400' with LRAD & new prosthesis modified independent for community mobility.     Baseline        Time  3    Period  Months    Status  On-going    Target Date  05/20/18      PT LONG TERM GOAL #4   Title  Patient negotiates ramps, curbs & stairs with LRAD & new prosthesis modified independent for community access.     Baseline         Time  3    Period  Months    Status  On-going    Target Date  05/20/18      PT LONG TERM GOAL #5   Title  Patient ambulates around furniture 100' with LRAD (single UE support) and new prosthesis carrying cup of water modified independent.    Baseline        Time  3    Period  Months    Status  On-going    Target Date  05/20/18      PT LONG TERM GOAL #6   Title  Berg Balance > 36/56 to indicate lower fall risk.    Baseline        Time  3    Period  Months    Status  On-going    Target Date  05/20/18            Plan - 04/25/18 1409    Clinical Impression Statement  Today's skilled session continued to focus on gait with rollator and balance reactions with decreased UE support. The pt continues to make steady progress toward goals and should benefit from continued PT to progress toward unmet goals.  Rehab Potential  Good    PT Frequency  2x / week    PT Treatment/Interventions  ADLs/Self Care Home Management;DME Instruction;Gait training;Stair training;Functional mobility training;Therapeutic activities;Balance training;Therapeutic exercise;Neuromuscular re-education;Patient/family education;Prosthetic Training;Manual techniques;Canalith Repostioning;Vestibular    PT Next Visit Plan  continue to work with rollator for community and small base quad cane for household distances. continue to work on balance reactions       Patient will benefit from skilled therapeutic intervention in order to improve the following deficits and impairments:  Abnormal gait, Decreased activity tolerance, Decreased balance, Decreased endurance, Decreased mobility, Decreased range of motion, Impaired flexibility, Prosthetic Dependency, Postural dysfunction, Decreased strength  Visit Diagnosis: Other abnormalities of gait and mobility  Unsteadiness on feet  Muscle weakness (generalized)     Problem List Patient Active Problem List   Diagnosis Date Noted  . Memory loss 11/23/2017  .  Takotsubo cardiomyopathy   . Hypertensive heart disease   . Brain aneurysm 04/13/2016  . Dyslipidemia   . CAD in native artery   . PAC (premature atrial contraction) 08/14/2014  . PVC's (premature ventricular contractions) 08/14/2014  . Palpitations 11/02/2013  . Essential hypertension, benign 11/01/2013  . Peripheral vascular disease, unspecified ([place]) 11/01/2013    Willow Ora, PTA, [facility] [address], [place], [place] [address] [phone] 04/26/18, 12:54 PM   Name: [name] MRN: [id number] Date of Birth: 12-12-1940

## 2018-04-27 ENCOUNTER — Ambulatory Visit: Payer: Medicare Other | Admitting: Physical Therapy

## 2018-04-27 ENCOUNTER — Encounter: Payer: Self-pay | Admitting: Physical Therapy

## 2018-04-27 DIAGNOSIS — R2681 Unsteadiness on feet: Secondary | ICD-10-CM

## 2018-04-27 DIAGNOSIS — M6281 Muscle weakness (generalized): Secondary | ICD-10-CM

## 2018-04-27 DIAGNOSIS — R2689 Other abnormalities of gait and mobility: Secondary | ICD-10-CM

## 2018-04-27 DIAGNOSIS — Z9181 History of falling: Secondary | ICD-10-CM

## 2018-04-27 NOTE — Therapy (Signed)
[facility] [address] [place], [place], [address] Phone: [phone]   Fax:  [phone]  Physical Therapy Treatment  Patient Details  Name: [name] MRN: [id number] Date of Birth: 26-Nov-1940 Referring Provider (PT): Meridee Score, MD   Encounter Date: 04/27/2018  PT End of Session - 04/27/18 2156    Visit Number  19    Number of Visits  26    Date for PT Re-Evaluation  05/20/18    Authorization Type  [employer]HC Medicare & Medicaid    PT Start Time  1406    PT Stop Time  1450    PT Time Calculation (min)  44 min    Activity Tolerance  Patient tolerated treatment well;No increased pain    Behavior During Therapy  WFL for tasks assessed/performed       Past Medical History:  Diagnosis Date  . CAD in native artery    a. 04/2016 NSTEMI with cath showing 25% MCx, 75% DX1 lesion and moderate LVD 35-45% out of proportion to CAD and in a pattern of Takotsubo CM (no clear stressor)  . Dyslipidemia   . GERD (gastroesophageal reflux disease)   . Hypercholesteremia   . Hypertension   . Hypertensive heart disease    initially she was thought to have a HOCM variant by cath with increased LVOT gradient but echo showed only a 75mHg gradient across the LVOT and SAM and cardiac MRI did not show any late gad enhancement and only a 581mg LVOT gradient with 1551mSH.  There was some mild SAM and mild MR.  EF normal at 76%.    . LMarland KitchenH (left ventricular hypertrophy)    a. cMRI 2018 - showing moderate Basal septal hypertrophy not classic for HOCM morphology with no delayed gadolium uptake to suggest myofibrillar disarray, + SAM with small LVOT gradient in regard to physiology and mild MR.  . NSTEMI (non-ST elevated myocardial infarction) ([facility] [facility]   secondary to stress CM - Takotsubo CM  . PAC (premature atrial contraction)    Event monitors 10/2013 and 08/2014 showed NSR, ST, PACs, PVCs.   . 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 Posterior communicating artery aneurysm    followed by Dr SteVertell Limber PVC's (premature ventricular contractions)    Event monitors 10/2013 and 08/2014 showed NSR, ST, PACs, PVCs.   . 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 ABDOMINAL HYSTERECTOMY    . 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 CARDIAC CATHETERIZATION N/A 04/21/2016   Procedure: Left Heart Cath and Coronary Angiography;  Surgeon: JayJettie BoozeD;  Location: [facility];  Service: Cardiovascular;  Laterality: N/A;  . IR GENERIC HISTORICAL  02/13/2016   IR RADIOLOGY PERIPHERAL GUIDED IV START 02/13/2016 [facility]Greggory KeenD [facility]  . IR GENERIC HISTORICAL  02/13/2016   IR US [place]IDE VASC ACCESS LEFT 02/13/2016 MicGreggory KeenD [facility]    There were no vitals filed for this visit.  Subjective Assessment - 04/27/18 1409    Subjective  Doing well; prosthesis is rubbing a hole in the back of her jeans.  [facility]    Pertinent History  R TFA, CAD, HTN, NSTEMI, PAD,     Limitations  Standing;Walking;House hold activities  Patient Stated Goals  She wants to learn to use new prosthesis to stand & walk better.     Currently in Pain?  No/denies                       [facility] Adult PT Treatment/Exercise - 04/27/18 1417      Self-Care   Self-Care  Other Self-Care Comments    Other Self-Care Comments   Discussed goals for PT including goal to ambulate with cane.  Pt states she does ambulate short distances in her home with the cane but has the contralateral UE holding walls, furniture or a family member indicating pt still feels most confident and balanced with bilat UE support.        Exercises   Exercises  Knee/Hip      Knee/Hip Exercises: Standing   Hip Abduction  Stengthening;Right;Left;1 set;10 reps;Knee  straight    Abduction Limitations  Isometric strengthening and stability training: pressing one leg isometrically into wall and raising opposite UE to facilitate increased activation of stance leg glute med.  Unable to sense when R foot pressing into wall and may benefit from pressing lateral thigh into ball into wall for improved feedback.      Knee/Hip Exercises: Sidelying   Hip ABduction  Strengthening;Right;Left;1 set;10 reps    Hip ABduction Limitations  R/L sidelying with focus on technique to activate glute med instead of TFL (bring LE up and back) while contralateral LE presses into mat to activate contralateral glute med for stabilization      Prosthetics   Current prosthetic wear tolerance (days/week)   daily    Current prosthetic wear tolerance (#hours/day)   most awake hours out of bed (average is from 11am to 5 pm).     Edema  none    Residual limb condition   no issues per pt report               PT Short Term Goals - 04/18/18 1409      PT SHORT TERM GOAL #1   Title  Patient sit to/from stand chairs without armrests to cane engaging prosthetic knee properly with supervision. (Target Date: 04/22/2018)    Baseline  04/18/18: met today    Time  --    Period  --    Status  Achieved      PT SHORT TERM GOAL #2   Title  Patient able to pick up 1# from floor with cane support with supervision.     Baseline  04/18/18: met today    Time  --    Period  --    Status  Achieved      PT SHORT TERM GOAL #3   Title  Patient negotiates ramps, curbs with RW & stairs with 1 rail/cane with min guard.     Baseline  04/18/18: met today    Period  --    Status  Achieved      PT SHORT TERM GOAL #4   Title  Patient ambulates 66' with cane & prosthesis with minA.     Baseline  04/18/18: met today    Time  --    Period  --    Status  Achieved        PT Long Term Goals - 03/23/18 2216      PT LONG TERM GOAL #1   Title  Patient verbalizes & demonstrates understanding of  prosthetic care with new prosthesis to enable safe use. (Target Date:  07/20/2017)    Baseline        Time  3    Period  Months    Status  On-going    Target Date  05/20/18      PT LONG TERM GOAL #2   Title  Patient tolerates wear of new prosthesis >90% of awake hours without skin issues to enable safe use throughout her day.    Baseline        Time  3    Period  Months    Status  On-going    Target Date  05/20/18      PT LONG TERM GOAL #3   Title  Patient ambulates 400' with LRAD & new prosthesis modified independent for community mobility.     Baseline        Time  3    Period  Months    Status  On-going    Target Date  05/20/18      PT LONG TERM GOAL #4   Title  Patient negotiates ramps, curbs & stairs with LRAD & new prosthesis modified independent for community access.     Baseline        Time  3    Period  Months    Status  On-going    Target Date  05/20/18      PT LONG TERM GOAL #5   Title  Patient ambulates around furniture 100' with LRAD (single UE support) and new prosthesis carrying cup of water modified independent.    Baseline        Time  3    Period  Months    Status  On-going    Target Date  05/20/18      PT LONG TERM GOAL #6   Title  Berg Balance > 36/56 to indicate lower fall risk.    Baseline        Time  3    Period  Months    Status  On-going    Target Date  05/20/18            Plan - 04/27/18 2156    Clinical Impression Statement  Pt continues to make good progress with RW and rollator but when ambulating with the cane in her home, pt requires bilat UE support (furniture/walls/family members) due to impaired balance.  Focus of session today on strengthening and training proximal hip muscles for increased stability in standing and SLS to carry over to gait with cane.  Performed hip stability training in sidelying and standing.  Pt required mod-max verbal and visual cues to perform correct technique and to minimize compensatory movements.  Pt  tolerated well; will continue to progress towards LTG.    Rehab Potential  Good    PT Frequency  2x / week    PT Treatment/Interventions  ADLs/Self Care Home Management;DME Instruction;Gait training;Stair training;Functional mobility training;Therapeutic activities;Balance training;Therapeutic exercise;Neuromuscular re-education;Patient/family education;Prosthetic Training;Manual techniques;Canalith Repostioning;Vestibular    PT Next Visit Plan  10th visit PN!  Needs proximal hip stability and SLS training (one leg pressing into wall); continue to work with rollator for community and small base quad cane for household distances. continue to work on balance reactions       Patient will benefit from skilled therapeutic intervention in order to improve the following deficits and impairments:  Abnormal gait, Decreased activity tolerance, Decreased balance, Decreased endurance, Decreased mobility, Decreased range of motion, Impaired flexibility, Prosthetic Dependency, Postural dysfunction, Decreased strength  Visit Diagnosis: Other abnormalities of gait  and mobility  Unsteadiness on feet  Muscle weakness (generalized)  History of falling     Problem List Patient Active Problem List   Diagnosis Date Noted  . Memory loss 11/23/2017  . Takotsubo cardiomyopathy   . Hypertensive heart disease   . Brain aneurysm 04/13/2016  . Dyslipidemia   . CAD in native artery   . PAC (premature atrial contraction) 08/14/2014  . PVC's (premature ventricular contractions) 08/14/2014  . Palpitations 11/02/2013  . Essential hypertension, benign 11/01/2013  . Peripheral vascular disease, unspecified ([place]) 11/01/2013   Rico Junker, PT, DPT 04/27/18    10:05 PM    [facility] [address] [place], [place], [address] Phone: [phone]   Fax:  [phone]  Name: TYEISHA DINAN MRN: [id number] Date of Birth: 10-25-1940

## 2018-05-02 ENCOUNTER — Ambulatory Visit: Payer: Medicare Other | Admitting: Physical Therapy

## 2018-05-02 ENCOUNTER — Encounter: Payer: Self-pay | Admitting: Physical Therapy

## 2018-05-02 DIAGNOSIS — R2681 Unsteadiness on feet: Secondary | ICD-10-CM

## 2018-05-02 DIAGNOSIS — R2689 Other abnormalities of gait and mobility: Secondary | ICD-10-CM | POA: Diagnosis not present

## 2018-05-02 DIAGNOSIS — M6281 Muscle weakness (generalized): Secondary | ICD-10-CM

## 2018-05-03 NOTE — Therapy (Addendum)
[facility] [address] [place], [place], [address] Phone: [phone]   Fax:  [phone]   Progress Note Reporting Period 03/28/2018 to 05/04/2018  See note below for Objective Data and Assessment of Progress/Goals.       Physical Therapy Treatment  Patient Details  Name: [name] MRN: [id number] Date of Birth: [birth year] Referring Provider (PT): Meridee Score, MD   Encounter Date: 05/02/2018  PT End of Session - 05/02/18 2124    Visit Number  20    Number of Visits  26    Date for PT Re-Evaluation  05/20/18    Authorization Type  [employer] Medicare & Medicaid    PT Start Time  1402    PT Stop Time  1445    PT Time Calculation (min)  43 min    Equipment Utilized During Treatment  Gait belt    Activity Tolerance  Patient tolerated treatment well;No increased pain    Behavior During Therapy  WFL for tasks assessed/performed       Past Medical History:  Diagnosis Date  . CAD in native artery    a. 04/2016 NSTEMI with cath showing 25% MCx, 75% DX1 lesion and moderate LVD 35-45% out of proportion to CAD and in a pattern of Takotsubo CM (no clear stressor)  . Dyslipidemia   . GERD (gastroesophageal reflux disease)   . Hypercholesteremia   . Hypertension   . Hypertensive heart disease    initially she was thought to have a HOCM variant by cath with increased LVOT gradient but echo showed only a 49mHg gradient across the LVOT and SAM and cardiac MRI did not show any late gad enhancement and only a 561mg LVOT gradient with 1570mSH.  There was some mild SAM and mild MR.  EF normal at 76%.    . LMarland KitchenH (left ventricular hypertrophy)    a. cMRI 2018 - showing moderate Basal septal hypertrophy not classic for HOCM morphology with no delayed gadolium uptake to suggest myofibrillar disarray, + SAM with small LVOT gradient in regard to physiology and mild MR.  . NSTEMI (non-ST elevated myocardial infarction) ([facility]/2017    secondary to stress CM - Takotsubo CM  . PAC (premature atrial contraction)    Event monitors 10/2013 and 08/2014 showed NSR, ST, PACs, PVCs.   . 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 Posterior communicating artery aneurysm    followed by Dr SteVertell Limber PVC's (premature ventricular contractions)    Event monitors 10/2013 and 08/2014 showed NSR, ST, PACs, PVCs.   . 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 ABDOMINAL HYSTERECTOMY    . 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 CARDIAC CATHETERIZATION N/A 04/21/2016   Procedure: Left Heart Cath and Coronary Angiography;  Surgeon: JayJettie BoozeD;  Location: [facility];  Service: Cardiovascular;  Laterality: N/A;  . IR GENERIC HISTORICAL  02/13/2016   IR RADIOLOGY PERIPHERAL GUIDED IV START 02/13/2016 [facility]ggory KeenD [facility]  . IR GENERIC HISTORICAL  02/13/2016   IR US [place]IDE VASC ACCESS LEFT 02/13/2016 [facility]Greggory KeenD [facility]    There were no vitals filed for this visit.  Subjective Assessment - 05/02/18 1403    Subjective  No new complaints. Discussed with her that she  should talk to [facility] about prosthesis rubbing holes in her clothers. She stated it was her old socket that did that to older pants, has not noticed this happening with new socket.     Pertinent History  R TFA, CAD, HTN, NSTEMI, PAD,     Limitations  Standing;Walking;House hold activities           [facility] Adult PT Treatment/Exercise - 05/02/18 1412      Transfers   Transfers  Sit to Stand;Stand to Sit    Sit to Stand  5: Supervision;With upper extremity assist;With armrests;From chair/3-in-1    Stand to Sit  5: Supervision;With upper extremity assist;To chair/3-in-1      Ambulation/Gait   Ambulation/Gait  Yes    Ambulation/Gait Assistance  5: Supervision    Ambulation/Gait Assistance  Details  cues on posture, for increased step length, and weight shifting. cues also needed for increased knee flexion with swing phase. min guard to min assist for balance with use of small base quad cane. supervision/Mod I with RW to enter/exit gym.                    Ambulation Distance (Feet)  120 Feet   x1   Assistive device  Rolling walker;Small based quad cane    Gait Pattern  Step-to pattern;Decreased stride length;Decreased step length - right;Decreased stance time - left;Decreased hip/knee flexion - right;Trunk flexed;Narrow base of support    Ambulation Surface  Level;Indoor      High Level Balance   High Level Balance Activities  Figure 8 turns;Negotiating over obstacles    High Level Balance Comments  with small base quad cane/prosthesis: figure 8's around hoola hoops on floor for 2-3 reps with min guard to min assist, cues on step length and pelvic positioning with gait; stepping over bolsters of varied heights with cues on sequencing, weight shifting and technique, HHA needed for balance.                      Prosthetics   Current prosthetic wear tolerance (days/week)   daily    Current prosthetic wear tolerance (#hours/day)   most awake hours out of bed (average is from 11am to 5 pm).     Residual limb condition   no issues per pt report    Donning Prosthesis  Modified independent (device/increased time)    Doffing Prosthesis  Modified independent (device/increased time)               PT Short Term Goals - 04/18/18 1409      PT SHORT TERM GOAL #1   Title  Patient sit to/from stand chairs without armrests to cane engaging prosthetic knee properly with supervision. (Target Date: 04/22/2018)    Baseline  04/18/18: met today    Time  --    Period  --    Status  Achieved      PT SHORT TERM GOAL #2   Title  Patient able to pick up 1# from floor with cane support with supervision.     Baseline  04/18/18: met today    Time  --    Period  --    Status  Achieved      PT  SHORT TERM GOAL #3   Title  Patient negotiates ramps, curbs with RW & stairs with 1 rail/cane with min guard.     Baseline  04/18/18: met today    Period  --    Status  Achieved  PT SHORT TERM GOAL #4   Title  Patient ambulates 33' with cane & prosthesis with minA.     Baseline  04/18/18: met today    Time  --    Period  --    Status  Achieved        PT Long Term Goals - 03/23/18 2216      PT LONG TERM GOAL #1   Title  Patient verbalizes & demonstrates understanding of prosthetic care with new prosthesis to enable safe use. (Target Date: 07/20/2017)    Baseline        Time  3    Period  Months    Status  On-going    Target Date  05/20/18      PT LONG TERM GOAL #2   Title  Patient tolerates wear of new prosthesis >90% of awake hours without skin issues to enable safe use throughout her day.    Baseline        Time  3    Period  Months    Status  On-going    Target Date  05/20/18      PT LONG TERM GOAL #3   Title  Patient ambulates 400' with LRAD & new prosthesis modified independent for community mobility.     Baseline        Time  3    Period  Months    Status  On-going    Target Date  05/20/18      PT LONG TERM GOAL #4   Title  Patient negotiates ramps, curbs & stairs with LRAD & new prosthesis modified independent for community access.     Baseline        Time  3    Period  Months    Status  On-going    Target Date  05/20/18      PT LONG TERM GOAL #5   Title  Patient ambulates around furniture 100' with LRAD (single UE support) and new prosthesis carrying cup of water modified independent.    Baseline        Time  3    Period  Months    Status  On-going    Target Date  05/20/18      PT LONG TERM GOAL #6   Title  Berg Balance > 36/56 to indicate lower fall risk.    Baseline        Time  3    Period  Months    Status  On-going    Target Date  05/20/18         Plan - 05/02/18 2124    Clinical Impression Statement  Today's skilled session continued  to focus on use of small based quad cane/prosthesis with gait and balance activities with no issues reported. The pt is progressing toward goals and should benefit from continued PT to progress toward unmet goals.    Rehab Potential  Good    PT Frequency  2x / week    PT Treatment/Interventions  ADLs/Self Care Home Management;DME Instruction;Gait training;Stair training;Functional mobility training;Therapeutic activities;Balance training;Therapeutic exercise;Neuromuscular re-education;Patient/family education;Prosthetic Training;Manual techniques;Canalith Repostioning;Vestibular    PT Next Visit Plan  Needs proximal hip stability and SLS training (one leg pressing into wall); continue to work with rollator for community and small base quad cane for household distances. continue to work on balance reactions       Patient will benefit from skilled therapeutic intervention in order to improve the following deficits and impairments:  Abnormal gait, Decreased activity tolerance, Decreased balance, Decreased endurance, Decreased mobility, Decreased range of motion, Impaired flexibility, Prosthetic Dependency, Postural dysfunction, Decreased strength  Visit Diagnosis: Other abnormalities of gait and mobility  Unsteadiness on feet  Muscle weakness (generalized)     Problem List Patient Active Problem List   Diagnosis Date Noted  . Memory loss 11/23/2017  . Takotsubo cardiomyopathy   . Hypertensive heart disease   . Brain aneurysm 04/13/2016  . Dyslipidemia   . CAD in native artery   . PAC (premature atrial contraction) 08/14/2014  . PVC's (premature ventricular contractions) 08/14/2014  . Palpitations 11/02/2013  . Essential hypertension, benign 11/01/2013  . Peripheral vascular disease, unspecified ([place]) 11/01/2013    Willow Ora, PTA, [facility] [address], [place], [place] [address] [phone] 05/03/18, 9:34 PM   Name: [name] MRN:  [id number] Date of Birth: 1941/05/01

## 2018-05-04 ENCOUNTER — Encounter: Payer: Self-pay | Admitting: Physical Therapy

## 2018-05-04 ENCOUNTER — Ambulatory Visit: Payer: Medicare Other | Admitting: Physical Therapy

## 2018-05-04 DIAGNOSIS — R2689 Other abnormalities of gait and mobility: Secondary | ICD-10-CM

## 2018-05-04 DIAGNOSIS — M6281 Muscle weakness (generalized): Secondary | ICD-10-CM

## 2018-05-04 DIAGNOSIS — R2681 Unsteadiness on feet: Secondary | ICD-10-CM

## 2018-05-05 NOTE — Therapy (Signed)
[facility] [address] [place], [place], [address] Phone: [phone]   Fax:  [phone]  Physical Therapy Treatment  Patient Details  Name: [name] MRN: [id number] Date of Birth: 1940-07-21 Referring Provider (PT): Meridee Score, MD   Encounter Date: 05/04/2018  PT End of Session - 05/04/18 1409    Visit Number  21    Number of Visits  26    Date for PT Re-Evaluation  05/20/18    Authorization Type  [employer]HC Medicare & Medicaid    PT Start Time  9163    PT Stop Time  1445    PT Time Calculation (min)  42 min    Equipment Utilized During Treatment  Gait belt    Activity Tolerance  Patient tolerated treatment well;No increased pain    Behavior During Therapy  WFL for tasks assessed/performed       Past Medical History:  Diagnosis Date  . CAD in native artery    a. 04/2016 NSTEMI with cath showing 25% MCx, 75% DX1 lesion and moderate LVD 35-45% out of proportion to CAD and in a pattern of Takotsubo CM (no clear stressor)  . Dyslipidemia   . GERD (gastroesophageal reflux disease)   . Hypercholesteremia   . Hypertension   . Hypertensive heart disease    initially she was thought to have a HOCM variant by cath with increased LVOT gradient but echo showed only a 33mHg gradient across the LVOT and SAM and cardiac MRI did not show any late gad enhancement and only a 514mg LVOT gradient with 153mSH.  There was some mild SAM and mild MR.  EF normal at 76%.    . LMarland KitchenH (left ventricular hypertrophy)    a. cMRI 2018 - showing moderate Basal septal hypertrophy not classic for HOCM morphology with no delayed gadolium uptake to suggest myofibrillar disarray, + SAM with small LVOT gradient in regard to physiology and mild MR.  . NSTEMI (non-ST elevated myocardial infarction) ([facility]/2017   secondary to stress CM - Takotsubo CM  . PAC (premature atrial contraction)    Event monitors 10/2013 and 08/2014 showed NSR, ST, PACs,  PVCs.   . 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 Posterior communicating artery aneurysm    followed by Dr SteVertell Limber PVC's (premature ventricular contractions)    Event monitors 10/2013 and 08/2014 showed NSR, ST, PACs, PVCs.   . 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 ABDOMINAL HYSTERECTOMY    . 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 CARDIAC CATHETERIZATION N/A 04/21/2016   Procedure: Left Heart Cath and Coronary Angiography;  Surgeon: JayJettie BoozeD;  Location: [facility];  Service: Cardiovascular;  Laterality: N/A;  . IR GENERIC HISTORICAL  02/13/2016   IR RADIOLOGY PERIPHERAL GUIDED IV START 02/13/2016 [facility]ggory KeenD [facility]  . IR GENERIC HISTORICAL  02/13/2016   IR US [place]IDE VASC ACCESS LEFT 02/13/2016 MicGreggory KeenD [facility]    There were no vitals filed for this visit.  Subjective Assessment - 05/04/18 1408    Subjective  No new complaints. No falls or pain to report.     Pertinent History  R TFA, CAD, HTN, NSTEMI, PAD,     Limitations  Standing;Walking;House hold activities  Patient Stated Goals  She wants to learn to use new prosthesis to stand & walk better.     Currently in Pain?  No/denies           [facility] Adult PT Treatment/Exercise - 05/04/18 1411      Transfers   Transfers  Sit to Stand;Stand to Sit    Sit to Stand  5: Supervision;With upper extremity assist;With armrests;From chair/3-in-1    Stand to Sit  5: Supervision;With upper extremity assist;To chair/3-in-1      Ambulation/Gait   Ambulation/Gait  Yes    Ambulation/Gait Assistance  6: Modified independent (Device/Increase time);5: Supervision;4: Min guard    Ambulation/Gait Assistance Details  Mod I with RW to enter/exit gym. 500 feet with rollator in/oudoors with supervision, occasional cues on proximity to  rollator. use of small base quad cane for 115 feet x2 with mostly supervision, occasional need for assistance. cues for incr step length and use of prosthetic knee with gait.                    Ambulation Distance (Feet)  500 Feet   x1, 115 x2   Assistive device  Rolling walker;Rollator;Small based quad cane    Gait Pattern  Step-to pattern;Decreased stride length;Decreased step length - right;Decreased stance time - left;Decreased hip/knee flexion - right;Trunk flexed;Narrow base of support;Step-through pattern    Ambulation Surface  Level;Unlevel;Indoor;Outdoor;Paved    Gait Comments  pt does continue to have decr gait speed with all devices and continues to need cues to bend prosthetic knee with swing phase of gait with all devices.            PT Short Term Goals - 04/18/18 1409      PT SHORT TERM GOAL #1   Title  Patient sit to/from stand chairs without armrests to cane engaging prosthetic knee properly with supervision. (Target Date: 04/22/2018)    Baseline  04/18/18: met today    Time  --    Period  --    Status  Achieved      PT SHORT TERM GOAL #2   Title  Patient able to pick up 1# from floor with cane support with supervision.     Baseline  04/18/18: met today    Time  --    Period  --    Status  Achieved      PT SHORT TERM GOAL #3   Title  Patient negotiates ramps, curbs with RW & stairs with 1 rail/cane with min guard.     Baseline  04/18/18: met today    Period  --    Status  Achieved      PT SHORT TERM GOAL #4   Title  Patient ambulates 14' with cane & prosthesis with minA.     Baseline  04/18/18: met today    Time  --    Period  --    Status  Achieved        PT Long Term Goals - 03/23/18 2216      PT LONG TERM GOAL #1   Title  Patient verbalizes & demonstrates understanding of prosthetic care with new prosthesis to enable safe use. (Target Date: 07/20/2017)    Baseline        Time  3    Period  Months    Status  On-going    Target Date  05/20/18      PT  LONG TERM GOAL #2   Title  Patient tolerates  wear of new prosthesis >90% of awake hours without skin issues to enable safe use throughout her day.    Baseline        Time  3    Period  Months    Status  On-going    Target Date  05/20/18      PT LONG TERM GOAL #3   Title  Patient ambulates 400' with LRAD & new prosthesis modified independent for community mobility.     Baseline        Time  3    Period  Months    Status  On-going    Target Date  05/20/18      PT LONG TERM GOAL #4   Title  Patient negotiates ramps, curbs & stairs with LRAD & new prosthesis modified independent for community access.     Baseline        Time  3    Period  Months    Status  On-going    Target Date  05/20/18      PT LONG TERM GOAL #5   Title  Patient ambulates around furniture 100' with LRAD (single UE support) and new prosthesis carrying cup of water modified independent.    Baseline        Time  3    Period  Months    Status  On-going    Target Date  05/20/18      PT LONG TERM GOAL #6   Title  Berg Balance > 36/56 to indicate lower fall risk.    Baseline        Time  3    Period  Months    Status  On-going    Target Date  05/20/18            Plan - 05/04/18 1409    Clinical Impression Statement  Today's skilled session continued with use of rollator for longer, community gait and small base quad cane for indoor, shorter gait distances. No rest break needed today with outdoor gait. Less cues/assist needed with indoor gait with cane. The pt is progressing toward goals and should benefit from continued PT to progress toward unmet goals.     Rehab Potential  Good    PT Frequency  2x / week    PT Treatment/Interventions  ADLs/Self Care Home Management;DME Instruction;Gait training;Stair training;Functional mobility training;Therapeutic activities;Balance training;Therapeutic exercise;Neuromuscular re-education;Patient/family education;Prosthetic Training;Manual techniques;Canalith  Repostioning;Vestibular    PT Next Visit Plan  continue to work with rollator for community and small base quad cane for household distances. continue to work on balance reactions,. LTGs due 05/20/18.       Patient will benefit from skilled therapeutic intervention in order to improve the following deficits and impairments:  Abnormal gait, Decreased activity tolerance, Decreased balance, Decreased endurance, Decreased mobility, Decreased range of motion, Impaired flexibility, Prosthetic Dependency, Postural dysfunction, Decreased strength  Visit Diagnosis: Other abnormalities of gait and mobility  Unsteadiness on feet  Muscle weakness (generalized)     Problem List Patient Active Problem List   Diagnosis Date Noted  . Memory loss 11/23/2017  . Takotsubo cardiomyopathy   . Hypertensive heart disease   . Brain aneurysm 04/13/2016  . Dyslipidemia   . CAD in native artery   . PAC (premature atrial contraction) 08/14/2014  . PVC's (premature ventricular contractions) 08/14/2014  . Palpitations 11/02/2013  . Essential hypertension, benign 11/01/2013  . Peripheral vascular disease, unspecified ([place]) 11/01/2013    Willow Ora, PTA, [facility] [address] [place], [place] [address] [phone] 05/05/18, 1:56 PM   Name: [name] MRN: [id number] Date of Birth: [birth year]

## 2018-05-09 ENCOUNTER — Encounter: Payer: Self-pay | Admitting: Physical Therapy

## 2018-05-09 ENCOUNTER — Ambulatory Visit: Payer: Medicare Other | Admitting: Physical Therapy

## 2018-05-09 DIAGNOSIS — R2689 Other abnormalities of gait and mobility: Secondary | ICD-10-CM

## 2018-05-09 DIAGNOSIS — M6281 Muscle weakness (generalized): Secondary | ICD-10-CM

## 2018-05-09 DIAGNOSIS — Z9181 History of falling: Secondary | ICD-10-CM

## 2018-05-09 DIAGNOSIS — R2681 Unsteadiness on feet: Secondary | ICD-10-CM

## 2018-05-10 NOTE — Therapy (Signed)
[facility] [address] [place], [place], [address] Phone: [phone]   Fax:  [phone]  Physical Therapy Treatment  Patient Details  Name: [name] MRN: [id number] Date of Birth: 1940/11/10 Referring Provider (PT): Meridee Score, MD   Encounter Date: 05/09/2018  PT End of Session - 05/09/18 1800    Visit Number  22    Number of Visits  26    Date for PT Re-Evaluation  05/20/18    Authorization Type  UHC Medicare & Medicaid    PT Start Time  1400    PT Stop Time  1445    PT Time Calculation (min)  45 min    Equipment Utilized During Treatment  Gait belt    Activity Tolerance  Patient tolerated treatment well;No increased pain    Behavior During Therapy  WFL for tasks assessed/performed       Past Medical History:  Diagnosis Date  . CAD in native artery    a. 04/2016 NSTEMI with cath showing 25% MCx, 75% DX1 lesion and moderate LVD 35-45% out of proportion to CAD and in a pattern of Takotsubo CM (no clear stressor)  . Dyslipidemia   . GERD (gastroesophageal reflux disease)   . Hypercholesteremia   . Hypertension   . Hypertensive heart disease    initially she was thought to have a HOCM variant by cath with increased LVOT gradient but echo showed only a 35mHg gradient across the LVOT and SAM and cardiac MRI did not show any late gad enhancement and only a 552mg LVOT gradient with 1561mSH.  There was some mild SAM and mild MR.  EF normal at 76%.    . LMarland KitchenH (left ventricular hypertrophy)    a. cMRI 2018 - showing moderate Basal septal hypertrophy not classic for HOCM morphology with no delayed gadolium uptake to suggest myofibrillar disarray, + SAM with small LVOT gradient in regard to physiology and mild MR.  . NSTEMI (non-ST elevated myocardial infarction) ([facility]   secondary to stress CM - Takotsubo CM  . PAC (premature atrial contraction)    Event monitors 10/2013 and 08/2014 showed NSR, ST, PACs,  PVCs.   . 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 Posterior communicating artery aneurysm    followed by Dr SteVertell Limber PVC's (premature ventricular contractions)    Event monitors 10/2013 and 08/2014 showed NSR, ST, PACs, PVCs.   . 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 ABDOMINAL HYSTERECTOMY    . 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 CARDIAC CATHETERIZATION N/A 04/21/2016   Procedure: Left Heart Cath and Coronary Angiography;  Surgeon: JayJettie BoozeD;  Location: [facility];  Service: Cardiovascular;  Laterality: N/A;  . IR GENERIC HISTORICAL  02/13/2016   IR RADIOLOGY PERIPHERAL GUIDED IV START 02/13/2016 MicGreggory KeenD [facility]  . IR GENERIC HISTORICAL  02/13/2016   IR US [place]IDE VASC ACCESS LEFT 02/13/2016 MicGreggory KeenD [facility]    There were no vitals filed for this visit.  Subjective Assessment - 05/09/18 1404    Subjective  She has been using the cane the house "a little."    Pertinent History  R TFA, CAD, HTN, NSTEMI, PAD,     Limitations  Standing;Walking;House hold activities  Patient Stated Goals  She wants to learn to use new prosthesis to stand & walk better.     Currently in Pain?  No/denies                       [facility] Adult PT Treatment/Exercise - 05/09/18 1400      Transfers   Transfers  Sit to Stand;Stand to Sit    Sit to Stand  5: Supervision;With upper extremity assist;With armrests;From chair/3-in-1    Stand to Sit  5: Supervision;With upper extremity assist;To chair/3-in-1      Ambulation/Gait   Ambulation/Gait  Yes    Ambulation/Gait Assistance  5: Supervision    Ambulation/Gait Assistance Details  worked on negotiating around & over obstacles with [facility] and carrying plate.  Worked on ability to carry conversation & be distracted during gait.      Ambulation Distance (Feet)  100 Feet   50' X 5, 100' X 2   Assistive device  Small based quad cane;Prosthesis    Gait Pattern  Decreased stride length;Decreased step length - right;Decreased stance time - left;Decreased hip/knee flexion - right;Trunk flexed;Narrow base of support;Step-through pattern    Ambulation Surface  Level;Indoor    Stairs  Yes    Stairs Assistance  5: Supervision    Stair Management Technique  One rail Right;One rail Left;With cane;Step to pattern;Forwards    Number of Stairs  4   2 reps varying rail location   Gait Comments  pt does continue to have decr gait speed with all devices and continues to need cues to bend prosthetic knee with swing phase of gait with all devices.        High Level Balance   High Level Balance Activities  Side stepping;Backward walking;Negotitating around obstacles;Negotiating over obstacles;Head turns   SBQC & prosthesis   High Level Balance Comments  verbal cues on technique      Prosthetics   Current prosthetic wear tolerance (days/week)   daily    Current prosthetic wear tolerance (#hours/day)   most awake hours out of bed (average is from 11am to 5 pm).     Residual limb condition   no issues per pt report               PT Short Term Goals - 04/18/18 1409      PT SHORT TERM GOAL #1   Title  Patient sit to/from stand chairs without armrests to cane engaging prosthetic knee properly with supervision. (Target Date: 04/22/2018)    Baseline  04/18/18: met today    Time  --    Period  --    Status  Achieved      PT SHORT TERM GOAL #2   Title  Patient able to pick up 1# from floor with cane support with supervision.     Baseline  04/18/18: met today    Time  --    Period  --    Status  Achieved      PT SHORT TERM GOAL #3   Title  Patient negotiates ramps, curbs with RW & stairs with 1 rail/cane with min guard.     Baseline  04/18/18: met today    Period  --    Status  Achieved      PT SHORT TERM GOAL #4   Title   Patient ambulates 36' with cane & prosthesis with minA.     Baseline  04/18/18: met today    Time  --  Period  --    Status  Achieved        PT Long Term Goals - 03/23/18 2216      PT LONG TERM GOAL #1   Title  Patient verbalizes & demonstrates understanding of prosthetic care with new prosthesis to enable safe use. (Target Date: 07/20/2017)    Baseline        Time  3    Period  Months    Status  On-going    Target Date  05/20/18      PT LONG TERM GOAL #2   Title  Patient tolerates wear of new prosthesis >90% of awake hours without skin issues to enable safe use throughout her day.    Baseline        Time  3    Period  Months    Status  On-going    Target Date  05/20/18      PT LONG TERM GOAL #3   Title  Patient ambulates 400' with LRAD & new prosthesis modified independent for community mobility.     Baseline        Time  3    Period  Months    Status  On-going    Target Date  05/20/18      PT LONG TERM GOAL #4   Title  Patient negotiates ramps, curbs & stairs with LRAD & new prosthesis modified independent for community access.     Baseline        Time  3    Period  Months    Status  On-going    Target Date  05/20/18      PT LONG TERM GOAL #5   Title  Patient ambulates around furniture 100' with LRAD (single UE support) and new prosthesis carrying cup of water modified independent.    Baseline        Time  3    Period  Months    Status  On-going    Target Date  05/20/18      PT LONG TERM GOAL #6   Title  Berg Balance > 36/56 to indicate lower fall risk.    Baseline        Time  3    Period  Months    Status  On-going    Target Date  05/20/18            Plan - 05/09/18 1800    Clinical Impression Statement  Patient appears on target to meet LTGs by next week at end of plan of care. Patient was able to improve negotiating over & around obstacles with skilled instruction.     Rehab Potential  Good    PT Frequency  2x / week    PT  Treatment/Interventions  ADLs/Self Care Home Management;DME Instruction;Gait training;Stair training;Functional mobility training;Therapeutic activities;Balance training;Therapeutic exercise;Neuromuscular re-education;Patient/family education;Prosthetic Training;Manual techniques;Canalith Repostioning;Vestibular    PT Next Visit Plan  continue to work with rollator for community and small base quad cane for household distances. continue to work on balance reactions,. LTGs due 05/20/18.    Consulted and Agree with Plan of Care  Patient       Patient will benefit from skilled therapeutic intervention in order to improve the following deficits and impairments:  Abnormal gait, Decreased activity tolerance, Decreased balance, Decreased endurance, Decreased mobility, Decreased range of motion, Impaired flexibility, Prosthetic Dependency, Postural dysfunction, Decreased strength  Visit Diagnosis: Other abnormalities of gait and mobility  Unsteadiness on feet  Muscle weakness (  generalized)  History of falling     Problem List Patient Active Problem List   Diagnosis Date Noted  . Memory loss 11/23/2017  . Takotsubo cardiomyopathy   . Hypertensive heart disease   . Brain aneurysm 04/13/2016  . Dyslipidemia   . CAD in native artery   . PAC (premature atrial contraction) 08/14/2014  . PVC's (premature ventricular contractions) 08/14/2014  . Palpitations 11/02/2013  . Essential hypertension, benign 11/01/2013  . Peripheral vascular disease, unspecified ([place]) 11/01/2013    , PT, DPT 05/10/2018, 10:55 AM  [facility] [address] [place], [place], [address] Phone: [phone]   Fax:  [phone]  Name: [name] MRN: [id number] Date of Birth: Jul 24, 1940

## 2018-05-11 ENCOUNTER — Ambulatory Visit: Payer: Medicare Other | Admitting: Physical Therapy

## 2018-05-11 ENCOUNTER — Encounter: Payer: Self-pay | Admitting: Physical Therapy

## 2018-05-11 DIAGNOSIS — M6281 Muscle weakness (generalized): Secondary | ICD-10-CM

## 2018-05-11 DIAGNOSIS — R2681 Unsteadiness on feet: Secondary | ICD-10-CM

## 2018-05-11 DIAGNOSIS — R2689 Other abnormalities of gait and mobility: Secondary | ICD-10-CM | POA: Diagnosis not present

## 2018-05-11 DIAGNOSIS — Z9181 History of falling: Secondary | ICD-10-CM

## 2018-05-12 ENCOUNTER — Telehealth: Payer: Self-pay | Admitting: Physical Therapy

## 2018-05-12 ENCOUNTER — Other Ambulatory Visit (INDEPENDENT_AMBULATORY_CARE_PROVIDER_SITE_OTHER): Payer: Self-pay | Admitting: Orthopedic Surgery

## 2018-05-12 DIAGNOSIS — S78111A Complete traumatic amputation at level between right hip and knee, initial encounter: Secondary | ICD-10-CM

## 2018-05-12 NOTE — Telephone Encounter (Signed)
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 Can you please place an order in Epic for rollator walker with seat? Thank you Vladimir Faster, PT, DPT PT Specializing in Prosthetics & Orthotics 05/12/2018@ 6:28 AM Phone:  [phone]  Fax:  [phone] [facility] [address] [place], [place] [address]

## 2018-05-12 NOTE — Telephone Encounter (Signed)
Order placed, thanks.

## 2018-05-12 NOTE — Therapy (Signed)
[facility] [address] [place], [place], [address] Phone: [phone]   Fax:  [phone]  Physical Therapy Treatment  Patient Details  Name: [name] MRN: [id number] Date of Birth: 1941/03/20 Referring Provider (PT): Meridee Score, MD   Encounter Date: 05/11/2018  PT End of Session - 05/11/18 1800    Visit Number  23    Number of Visits  26    Date for PT Re-Evaluation  05/20/18    Authorization Type  UHC Medicare & Medicaid    PT Start Time  1400    PT Stop Time  1445    PT Time Calculation (min)  45 min    Equipment Utilized During Treatment  Gait belt    Activity Tolerance  Patient tolerated treatment well;No increased pain    Behavior During Therapy  WFL for tasks assessed/performed       Past Medical History:  Diagnosis Date  . CAD in native artery    a. 04/2016 NSTEMI with cath showing 25% MCx, 75% DX1 lesion and moderate LVD 35-45% out of proportion to CAD and in a pattern of Takotsubo CM (no clear stressor)  . Dyslipidemia   . GERD (gastroesophageal reflux disease)   . Hypercholesteremia   . Hypertension   . Hypertensive heart disease    initially she was thought to have a HOCM variant by cath with increased LVOT gradient but echo showed only a 69mHg gradient across the LVOT and SAM and cardiac MRI did not show any late gad enhancement and only a 510mg LVOT gradient with 1540mSH.  There was some mild SAM and mild MR.  EF normal at 76%.    . LMarland KitchenH (left ventricular hypertrophy)    a. cMRI 2018 - showing moderate Basal septal hypertrophy not classic for HOCM morphology with no delayed gadolium uptake to suggest myofibrillar disarray, + SAM with small LVOT gradient in regard to physiology and mild MR.  . NSTEMI (non-ST elevated myocardial infarction) ([facility]   secondary to stress CM - Takotsubo CM  . PAC (premature atrial contraction)    Event monitors 10/2013 and 08/2014 showed NSR, ST, PACs,  PVCs.   . 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 Posterior communicating artery aneurysm    followed by Dr SteVertell Limber PVC's (premature ventricular contractions)    Event monitors 10/2013 and 08/2014 showed NSR, ST, PACs, PVCs.   . 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 ABDOMINAL HYSTERECTOMY    . 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 CARDIAC CATHETERIZATION N/A 04/21/2016   Procedure: Left Heart Cath and Coronary Angiography;  Surgeon: JayJettie BoozeD;  Location: [facility];  Service: Cardiovascular;  Laterality: N/A;  . IR GENERIC HISTORICAL  02/13/2016   IR RADIOLOGY PERIPHERAL GUIDED IV START 02/13/2016 [facility]Greggory KeenD [facility]  . IR GENERIC HISTORICAL  02/13/2016   IR US [place]IDE VASC ACCESS LEFT 02/13/2016 MicGreggory KeenD [facility]    There were no vitals filed for this visit.  Subjective Assessment - 05/11/18 1400    Subjective  She used cane more around her house as PT recomended. No falls.     Pertinent History  R TFA, CAD, HTN, NSTEMI, PAD,     Limitations  Standing;Walking;House hold  activities    Patient Stated Goals  She wants to learn to use new prosthesis to stand & walk better.     Currently in Pain?  No/denies                       [facility] Adult PT Treatment/Exercise - 05/11/18 1400      Transfers   Transfers  Sit to Stand;Stand to Sit    Sit to Stand  5: Supervision;With upper extremity assist;With armrests;From chair/3-in-1   to [facility]   Stand to Sit  5: Supervision;With upper extremity assist;To chair/3-in-1   from [facility]     Ambulation/Gait   Ambulation/Gait  Yes    Ambulation/Gait Assistance  5: Supervision    Ambulation/Gait Assistance Details  carrying plate of objects around furniture and carrying conversation for divided attention.     Ambulation Distance  (Feet)  300 Feet   100' X 3 SBQC and 300' rollator   Assistive device  Small based quad cane;Prosthesis;4-wheeled walker;Rollator    Gait Pattern  Decreased stride length;Decreased step length - right;Decreased stance time - left;Decreased hip/knee flexion - right;Trunk flexed;Narrow base of support;Step-through pattern    Ambulation Surface  Indoor;Level    Stairs  Yes    Stairs Assistance  5: Supervision    Stair Management Technique  One rail Right;One rail Left;With cane;Step to pattern;Forwards    Number of Stairs  4   2 reps varying rail location   Ramp  5: Supervision   rollator walker & prosthesis   Ramp Details (indicate cue type and reason)  demo & verbal cues on technique with rollator walker    Curb  5: Supervision   rollator walker & prosthesis   Curb Details (indicate cue type and reason)  demo & verbal cues on technique with rollator walker      Self-Care   ADL's  worked in [place] on reaching in overhead & lower cabinets, placing items in/out oven & refrigerator, moving hot &/or heavier items using towel sliding on counter. Pt return demo understanding of all activities and pleased with ability.       Prosthetics   Current prosthetic wear tolerance (days/week)   daily    Current prosthetic wear tolerance (#hours/day)   most awake hours out of bed (average is from 11am to 5 pm).     Residual limb condition   no issues per pt report               PT Short Term Goals - 04/18/18 1409      PT SHORT TERM GOAL #1   Title  Patient sit to/from stand chairs without armrests to cane engaging prosthetic knee properly with supervision. (Target Date: 04/22/2018)    Baseline  04/18/18: met today    Time  --    Period  --    Status  Achieved      PT SHORT TERM GOAL #2   Title  Patient able to pick up 1# from floor with cane support with supervision.     Baseline  04/18/18: met today    Time  --    Period  --    Status  Achieved      PT SHORT TERM GOAL #3   Title   Patient negotiates ramps, curbs with RW & stairs with 1 rail/cane with min guard.     Baseline  04/18/18: met today    Period  --    Status  Achieved  PT SHORT TERM GOAL #4   Title  Patient ambulates 73' with cane & prosthesis with minA.     Baseline  04/18/18: met today    Time  --    Period  --    Status  Achieved        PT Long Term Goals - 03/23/18 2216      PT LONG TERM GOAL #1   Title  Patient verbalizes & demonstrates understanding of prosthetic care with new prosthesis to enable safe use. (Target Date: 07/20/2017)    Baseline        Time  3    Period  Months    Status  On-going    Target Date  05/20/18      PT LONG TERM GOAL #2   Title  Patient tolerates wear of new prosthesis >90% of awake hours without skin issues to enable safe use throughout her day.    Baseline        Time  3    Period  Months    Status  On-going    Target Date  05/20/18      PT LONG TERM GOAL #3   Title  Patient ambulates 400' with LRAD & new prosthesis modified independent for community mobility.     Baseline        Time  3    Period  Months    Status  On-going    Target Date  05/20/18      PT LONG TERM GOAL #4   Title  Patient negotiates ramps, curbs & stairs with LRAD & new prosthesis modified independent for community access.     Baseline        Time  3    Period  Months    Status  On-going    Target Date  05/20/18      PT LONG TERM GOAL #5   Title  Patient ambulates around furniture 100' with LRAD (single UE support) and new prosthesis carrying cup of water modified independent.    Baseline        Time  3    Period  Months    Status  On-going    Target Date  05/20/18      PT LONG TERM GOAL #6   Title  Berg Balance > 36/56 to indicate lower fall risk.    Baseline        Time  3    Period  Months    Status  On-going    Target Date  05/20/18            Plan - 05/11/18 1800    Clinical Impression Statement  Patient would benefit from a rollator walker for  increased community mobility. She improved confidence with prosthetic gait with [facility] for household type mobility. Patient appears on target for discharge next week.     Rehab Potential  Good    PT Frequency  2x / week    PT Treatment/Interventions  ADLs/Self Care Home Management;DME Instruction;Gait training;Stair training;Functional mobility training;Therapeutic activities;Balance training;Therapeutic exercise;Neuromuscular re-education;Patient/family education;Prosthetic Training;Manual techniques;Canalith Repostioning;Vestibular    PT Next Visit Plan  continue to work with rollator for community and small base quad cane for household distances. continue to work on balance reactions,.check LTGs due 05/20/18.    Consulted and Agree with Plan of Care  Patient       Patient will benefit from skilled therapeutic intervention in order to improve the following deficits and impairments:  Abnormal  gait, Decreased activity tolerance, Decreased balance, Decreased endurance, Decreased mobility, Decreased range of motion, Impaired flexibility, Prosthetic Dependency, Postural dysfunction, Decreased strength  Visit Diagnosis: Other abnormalities of gait and mobility  Unsteadiness on feet  Muscle weakness (generalized)  History of falling     Problem List Patient Active Problem List   Diagnosis Date Noted  . Memory loss 11/23/2017  . Takotsubo cardiomyopathy   . Hypertensive heart disease   . Brain aneurysm 04/13/2016  . Dyslipidemia   . CAD in native artery   . PAC (premature atrial contraction) 08/14/2014  . PVC's (premature ventricular contractions) 08/14/2014  . Palpitations 11/02/2013  . Essential hypertension, benign 11/01/2013  . Peripheral vascular disease, unspecified ([place]) 11/01/2013    , PT, DPT 05/12/2018, 12:37 PM  [facility] [address] [place], [place], [address] Phone: [phone]   Fax:   [phone]  Name: [name] MRN: [id number] Date of Birth: 10/14/1940

## 2018-05-16 ENCOUNTER — Ambulatory Visit: Payer: Medicare Other | Attending: Orthopedic Surgery | Admitting: Physical Therapy

## 2018-05-16 ENCOUNTER — Encounter: Payer: Self-pay | Admitting: Physical Therapy

## 2018-05-16 DIAGNOSIS — M6281 Muscle weakness (generalized): Secondary | ICD-10-CM | POA: Insufficient documentation

## 2018-05-16 DIAGNOSIS — R2681 Unsteadiness on feet: Secondary | ICD-10-CM | POA: Diagnosis present

## 2018-05-16 DIAGNOSIS — R2689 Other abnormalities of gait and mobility: Secondary | ICD-10-CM | POA: Diagnosis present

## 2018-05-16 DIAGNOSIS — Z9181 History of falling: Secondary | ICD-10-CM | POA: Diagnosis present

## 2018-05-16 DIAGNOSIS — M25651 Stiffness of right hip, not elsewhere classified: Secondary | ICD-10-CM | POA: Diagnosis present

## 2018-05-17 NOTE — Therapy (Signed)
[facility] [address] [place], [place], [address] Phone: [phone]   Fax:  [phone]  Physical Therapy Treatment  Patient Details  Name: [name] MRN: [id number] Date of Birth: 13-Feb-1941 Referring Provider (PT): Meridee Score, MD   Encounter Date: 05/16/2018  PT End of Session - 05/16/18 1407    Visit Number  24    Number of Visits  26    Date for PT Re-Evaluation  05/20/18    Authorization Type  [employer] Medicare & Medicaid    PT Start Time  1403    PT Stop Time  1444    PT Time Calculation (min)  41 min    Equipment Utilized During Treatment  Gait belt    Activity Tolerance  Patient tolerated treatment well;No increased pain    Behavior During Therapy  WFL for tasks assessed/performed       Past Medical History:  Diagnosis Date  . CAD in native artery    a. 04/2016 NSTEMI with cath showing 25% MCx, 75% DX1 lesion and moderate LVD 35-45% out of proportion to CAD and in a pattern of Takotsubo CM (no clear stressor)  . Dyslipidemia   . GERD (gastroesophageal reflux disease)   . Hypercholesteremia   . Hypertension   . Hypertensive heart disease    initially she was thought to have a HOCM variant by cath with increased LVOT gradient but echo showed only a 93mHg gradient across the LVOT and SAM and cardiac MRI did not show any late gad enhancement and only a 580mg LVOT gradient with 1572mSH.  There was some mild SAM and mild MR.  EF normal at 76%.    . LMarland KitchenH (left ventricular hypertrophy)    a. cMRI 2018 - showing moderate Basal septal hypertrophy not classic for HOCM morphology with no delayed gadolium uptake to suggest myofibrillar disarray, + SAM with small LVOT gradient in regard to physiology and mild MR.  . [place]STEMI (non-ST elevated myocardial infarction) ([place]   secondary to stress CM - Takotsubo CM  . PAC (premature atrial contraction)    Event monitors 10/2013 and 08/2014 showed NSR, ST, PACs,  PVCs.   . 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 Posterior communicating artery aneurysm    followed by Dr SteVertell Limber PVC's (premature ventricular contractions)    Event monitors 10/2013 and 08/2014 showed NSR, ST, PACs, PVCs.   . 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 ABDOMINAL HYSTERECTOMY    . 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 CARDIAC CATHETERIZATION N/A 04/21/2016   Procedure: Left Heart Cath and Coronary Angiography;  Surgeon: JayJettie BoozeD;  Location: [facility];  Service: Cardiovascular;  Laterality: N/A;  . IR GENERIC HISTORICAL  02/13/2016   IR RADIOLOGY PERIPHERAL GUIDED IV START 02/13/2016 MicGreggory KeenD [facility]  . IR GENERIC HISTORICAL  02/13/2016   IR US [place] VASC ACCESS LEFT 02/13/2016 MicGreggory KeenD [facility]    There were no vitals filed for this visit.  Subjective Assessment - 05/16/18 1407    Subjective  No new complaints. Continues to use cane at home some. No falls or pain to report. Recieved the MD order for rollator from Dr. DudSharol Giveniven to pt today.  Pertinent History  R TFA, CAD, HTN, NSTEMI, PAD,     Limitations  Standing;Walking;House hold activities    Patient Stated Goals  She wants to learn to use new prosthesis to stand & walk better.     Currently in Pain?  No/denies           [facility] Adult PT Treatment/Exercise - 05/16/18 1408      Transfers   Transfers  Sit to Stand;Stand to Sit    Sit to Stand  6: Modified independent (Device/Increase time);With upper extremity assist;From chair/3-in-1    Stand to Sit  6: Modified independent (Device/Increase time);With upper extremity assist;To chair/3-in-1      Ambulation/Gait   Ambulation/Gait  Yes    Ambulation/Gait Assistance  6: Modified independent (Device/Increase time)    Ambulation/Gait Assistance  Details  500 feet in/oudoors (paved surfaces) with rollator Mod I; use of small base quad cane indoors while carring cup of water, weaving around furniture mod I.                                        Ambulation Distance (Feet)  500 Feet   x1 with rollator, 170 x1 with cane   Assistive device  Small based quad cane;Prosthesis;Rollator;Rolling walker    Gait Pattern  Decreased stride length;Decreased step length - right;Decreased stance time - left;Decreased hip/knee flexion - right;Trunk flexed;Narrow base of support;Step-through pattern    Ambulation Surface  Level;Indoor    Ramp  6: Modified independent (Device)    Ramp Details (indicate cue type and reason)  with rollator/prosthesis x1 rep    Curb  6: Modified independent (Device/increase time)    Curb Details (indicate cue type and reason)  wiht rollator/prosthesis x 1 rep      Prosthetics   Current prosthetic wear tolerance (days/week)   daily    Current prosthetic wear tolerance (#hours/day)   most awake hours out of bed (average is from 11am to 5 pm).     Residual limb condition   no issues per pt report               PT Short Term Goals - 04/18/18 1409      PT SHORT TERM GOAL #1   Title  Patient sit to/from stand chairs without armrests to cane engaging prosthetic knee properly with supervision. (Target Date: 04/22/2018)    Baseline  04/18/18: met today    Time  --    Period  --    Status  Achieved      PT SHORT TERM GOAL #2   Title  Patient able to pick up 1# from floor with cane support with supervision.     Baseline  04/18/18: met today    Time  --    Period  --    Status  Achieved      PT SHORT TERM GOAL #3   Title  Patient negotiates ramps, curbs with RW & stairs with 1 rail/cane with min guard.     Baseline  04/18/18: met today    Period  --    Status  Achieved      PT SHORT TERM GOAL #4   Title  Patient ambulates 32' with cane & prosthesis with minA.     Baseline  04/18/18: met today    Time  --     Period  --    Status  Achieved  PT Long Term Goals - 05/16/18 1410      PT LONG TERM GOAL #1   Title  Patient verbalizes & demonstrates understanding of prosthetic care with new prosthesis to enable safe use. (Target Date: 07/20/2017)    Baseline  05/16/18: met today    Status  Achieved      PT LONG TERM GOAL #2   Title  Patient tolerates wear of new prosthesis >90% of awake hours without skin issues to enable safe use throughout her day.    Baseline  05/16/18: met today    Status  Achieved      PT LONG TERM GOAL #3   Title  Patient ambulates 400' with LRAD & new prosthesis modified independent for community mobility.     Baseline  05/16/18: met today with rollator/prosthesis    Time  --    Period  --    Status  Achieved      PT LONG TERM GOAL #4   Title  Patient negotiates ramps, curbs & stairs with LRAD & new prosthesis modified independent for community access.     Baseline  05/16/18: met with rollator on curb/ramp, need to assess stairs    Time  --    Period  --    Status  Partially Met      PT LONG TERM GOAL #5   Title  Patient ambulates around furniture 100' with LRAD (single UE support) and new prosthesis carrying cup of water modified independent.    Baseline  05/16/18: met today    Time  --    Period  --    Status  Achieved      PT LONG TERM GOAL #6   Title  Berg Balance > 36/56 to indicate lower fall risk.    Baseline        Time  3    Period  Months    Status  On-going            Plan - 05/16/18 1408    Clinical Impression Statement  Today's skilled session began to address progress toward LTGs. Pt met goals checked today and will plan to check remaining goals at next session.     Rehab Potential  Good    PT Frequency  2x / week    PT Treatment/Interventions  ADLs/Self Care Home Management;DME Instruction;Gait training;Stair training;Functional mobility training;Therapeutic activities;Balance training;Therapeutic exercise;Neuromuscular  re-education;Patient/family education;Prosthetic Training;Manual techniques;Canalith Repostioning;Vestibular    PT Next Visit Plan  check remaining goals for anticipated discharge at next visit.     Consulted and Agree with Plan of Care  Patient       Patient will benefit from skilled therapeutic intervention in order to improve the following deficits and impairments:  Abnormal gait, Decreased activity tolerance, Decreased balance, Decreased endurance, Decreased mobility, Decreased range of motion, Impaired flexibility, Prosthetic Dependency, Postural dysfunction, Decreased strength  Visit Diagnosis: Other abnormalities of gait and mobility  Unsteadiness on feet  Muscle weakness (generalized)     Problem List Patient Active Problem List   Diagnosis Date Noted  . Memory loss 11/23/2017  . Takotsubo cardiomyopathy   . Hypertensive heart disease   . Brain aneurysm 04/13/2016  . Dyslipidemia   . CAD in native artery   . PAC (premature atrial contraction) 08/14/2014  . PVC's (premature ventricular contractions) 08/14/2014  . Palpitations 11/02/2013  . Essential hypertension, benign 11/01/2013  . Peripheral vascular disease, unspecified ([place]) 11/01/2013   Willow Ora, PTA, [place] [phone]  [address], [place], [place] [address] [phone] 05/17/18, 11:44 AM   Name: [name] MRN: [id number] Date of Birth: 1941-03-08

## 2018-05-18 ENCOUNTER — Encounter: Payer: Self-pay | Admitting: Physical Therapy

## 2018-05-18 ENCOUNTER — Ambulatory Visit: Payer: Medicare Other | Admitting: Physical Therapy

## 2018-05-18 DIAGNOSIS — R2681 Unsteadiness on feet: Secondary | ICD-10-CM

## 2018-05-18 DIAGNOSIS — R2689 Other abnormalities of gait and mobility: Secondary | ICD-10-CM

## 2018-05-18 DIAGNOSIS — M6281 Muscle weakness (generalized): Secondary | ICD-10-CM

## 2018-05-18 DIAGNOSIS — M25651 Stiffness of right hip, not elsewhere classified: Secondary | ICD-10-CM

## 2018-05-18 DIAGNOSIS — Z9181 History of falling: Secondary | ICD-10-CM

## 2018-05-19 NOTE — Therapy (Signed)
[facility] [address] [place], [place], [address] Phone: [phone]   Fax:  [phone]   PHYSICAL THERAPY DISCHARGE SUMMARY  Visits from Start of Care: 25  Current functional level related to goals / functional outcomes: See below   Remaining deficits: See below   Education / Equipment: HEP & prosthetic care. Patient has prescription & is acquiring a rollator walker.   Plan: Patient agrees to discharge.  Patient goals were met. Patient is being discharged due to meeting the stated rehab goals.  ?????        Physical Therapy Treatment  Patient Details  Name: [name] MRN: [id number] Date of Birth: Nov 18, 1940 Referring Provider (PT): Meridee Score, MD   Encounter Date: 05/18/2018  PT End of Session - 05/18/18 1800    Visit Number  25    Number of Visits  26    Date for PT Re-Evaluation  05/20/18    Authorization Type  [employer]HC Medicare & Medicaid    PT Start Time  1400    PT Stop Time  1445    PT Time Calculation (min)  45 min    Equipment Utilized During Treatment  Gait belt    Activity Tolerance  Patient tolerated treatment well;No increased pain    Behavior During Therapy  WFL for tasks assessed/performed       Past Medical History:  Diagnosis Date  . CAD in native artery    a. 04/2016 NSTEMI with cath showing 25% MCx, 75% DX1 lesion and moderate LVD 35-45% out of proportion to CAD and in a pattern of Takotsubo CM (no clear stressor)  . Dyslipidemia   . GERD (gastroesophageal reflux disease)   . Hypercholesteremia   . Hypertension   . Hypertensive heart disease    initially she was thought to have a HOCM variant by cath with increased LVOT gradient but echo showed only a 4mHg gradient across the LVOT and SAM and cardiac MRI did not show any late gad enhancement and only a 555mg LVOT gradient with 1564mSH.  There was some mild SAM and mild MR.  EF normal at 76%.    . LMarland KitchenH (left ventricular  hypertrophy)    a. cMRI 2018 - showing moderate Basal septal hypertrophy not classic for HOCM morphology with no delayed gadolium uptake to suggest myofibrillar disarray, + SAM with small LVOT gradient in regard to physiology and mild MR.  . [place]STEMI (non-ST elevated myocardial infarction) ([place]   secondary to stress CM - Takotsubo CM  . PAC (premature atrial contraction)    Event monitors 10/2013 and 08/2014 showed NSR, ST, PACs, PVCs.   . 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 Posterior communicating artery aneurysm    followed by Dr SteVertell Limber PVC's (premature ventricular contractions)    Event monitors 10/2013 and 08/2014 showed NSR, ST, PACs, PVCs.   . 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 ABDOMINAL HYSTERECTOMY    . 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 CARDIAC CATHETERIZATION N/A 04/21/2016   Procedure: Left Heart Cath and Coronary Angiography;  Surgeon: JayJettie BoozeD;  Location: [facility];  Service: Cardiovascular;  Laterality: N/A;  . IR GENERIC HISTORICAL  02/13/2016   IR RADIOLOGY PERIPHERAL GUIDED IV START 02/13/2016 MicLegrand Como  Annamaria Boots, MD [facility]  . IR GENERIC HISTORICAL  02/13/2016   IR US GUIDE VASC ACCESS LEFT 02/13/2016 Greggory Keen, MD [facility]    There were no vitals filed for this visit.  Subjective Assessment - 05/18/18 1358    Subjective  No falls. She has been using cane in living room but has not tried kitchen yet.     Pertinent History  R TFA, CAD, HTN, NSTEMI, PAD,     Limitations  Standing;Walking;House hold activities    Patient Stated Goals  She wants to learn to use new prosthesis to stand & walk better.     Currently in Pain?  No/denies         [facility] PT Assessment - 05/18/18 1400      Ambulation/Gait   Stairs  Yes    Stairs Assistance  6: Modified independent  (Device/Increase time);4: Min assist   MinA / HHA for stairs w/no rails with SBQC   Stair Management Technique  One rail Right;One rail Left;With cane;Step to pattern;Forwards   No rail/SBQC HHA   Number of Stairs  4   3 reps (R rail/L SBQC, L rail/R SBQC, no rail/SBQC/HHA)   Height of Stairs  6    Ramp  6: Modified independent (Device)   rollator & prosthesis   Curb  6: Modified independent (Device/increase time)   rollator & prosthesis     Berg Balance Test   Sit to Stand  Able to stand  independently using hands    Standing Unsupported  Able to stand safely 2 minutes    Sitting with Back Unsupported but Feet Supported on Floor or Stool  Able to sit safely and securely 2 minutes    Stand to Sit  Controls descent by using hands    Transfers  Able to transfer safely, definite need of hands    Standing Unsupported with Eyes Closed  Able to stand 10 seconds safely    Standing Ubsupported with Feet Together  Needs help to attain position but able to stand for 30 seconds with feet together    From Standing, Reach Forward with Outstretched Arm  Can reach confidently >25 cm (10")    From Standing Position, Pick up Object from Floor  Able to pick up shoe, needs supervision    From Standing Position, Turn to Look Behind Over each Shoulder  Looks behind one side only/other side shows less weight shift    Turn 360 Degrees  Needs close supervision or verbal cueing    Standing Unsupported, Alternately Place Feet on Step/Stool  Able to complete >2 steps/needs minimal assist    Standing Unsupported, One Foot in Front  Able to take small step independently and hold 30 seconds    Standing on One Leg  Tries to lift leg/unable to hold 3 seconds but remains standing independently    Total Score  37    Berg comment:  Initial Berg 17/56      Timed Up and Go Test   Normal TUG (seconds)  37.38   d/c SBQC Initial TUG 43.86sec RW & prosthesis     Prosthetics Assessment - 05/18/18 1430      Prosthetics    Prosthetic Care Independent with  Skin check;Residual limb care;Prosthetic cleaning;Ply sock cleaning;Care of non-amputated limb;Correct ply sock adjustment;Proper wear schedule/adjustment;Proper weight-bearing schedule/adjustment    Current prosthetic wear tolerance (days/week)   daily    Current prosthetic wear tolerance (#hours/day)   most awake hours out of bed (average is from  11am to 5 pm).     Residual limb condition   no issues per pt report                          PT Education - 05/18/18 1430    Education Details  use of lock box to hide key in case needs EMS to get up.     Person(s) Educated  Patient    Methods  Explanation;Verbal cues    Comprehension  Verbalized understanding       PT Short Term Goals - 04/18/18 1409      PT SHORT TERM GOAL #1   Title  Patient sit to/from stand chairs without armrests to cane engaging prosthetic knee properly with supervision. (Target Date: 04/22/2018)    Baseline  04/18/18: met today    Time  --    Period  --    Status  Achieved      PT SHORT TERM GOAL #2   Title  Patient able to pick up 1# from floor with cane support with supervision.     Baseline  04/18/18: met today    Time  --    Period  --    Status  Achieved      PT SHORT TERM GOAL #3   Title  Patient negotiates ramps, curbs with RW & stairs with 1 rail/cane with min guard.     Baseline  04/18/18: met today    Period  --    Status  Achieved      PT SHORT TERM GOAL #4   Title  Patient ambulates 31' with cane & prosthesis with minA.     Baseline  04/18/18: met today    Time  --    Period  --    Status  Achieved        PT Long Term Goals - 05/18/18 1800      PT LONG TERM GOAL #1   Title  Patient verbalizes & demonstrates understanding of prosthetic care with new prosthesis to enable safe use. (Target Date: 07/20/2017)    Baseline  05/16/18: met today    Status  Achieved      PT LONG TERM GOAL #2   Title  Patient tolerates wear of new prosthesis >90%  of awake hours without skin issues to enable safe use throughout her day.    Baseline  05/16/18: met today    Status  Achieved      PT LONG TERM GOAL #3   Title  Patient ambulates 400' with LRAD & new prosthesis modified independent for community mobility.     Baseline  05/16/18: met today with rollator/prosthesis    Status  Achieved      PT LONG TERM GOAL #4   Title  Patient negotiates ramps, curbs & stairs with LRAD & new prosthesis modified independent for community access.     Baseline  MET 05/18/2018    Status  Achieved      PT LONG TERM GOAL #5   Title  Patient ambulates around furniture 100' with LRAD (single UE support) and new prosthesis carrying cup of water modified independent.    Baseline  05/16/18: met today    Status  Achieved      PT LONG TERM GOAL #6   Title  Berg Balance > 36/56 to indicate lower fall risk.    Baseline  MET 05/18/2018 Berg 37/56    Time  3    Period  Months    Status  Achieved            Plan - 05/18/18 1800    Clinical Impression Statement  Patient met all LTGs set. She appears to be functioning with prosthesis safely. In home she can use SBQC to get around & in community she uses rollator walker.     Rehab Potential  Good    PT Frequency  2x / week    PT Treatment/Interventions  ADLs/Self Care Home Management;DME Instruction;Gait training;Stair training;Functional mobility training;Therapeutic activities;Balance training;Therapeutic exercise;Neuromuscular re-education;Patient/family education;Prosthetic Training;Manual techniques;Canalith Repostioning;Vestibular    PT Next Visit Plan  discharge    Consulted and Agree with Plan of Care  Patient       Patient will benefit from skilled therapeutic intervention in order to improve the following deficits and impairments:  Abnormal gait, Decreased activity tolerance, Decreased balance, Decreased endurance, Decreased mobility, Decreased range of motion, Impaired flexibility, Prosthetic Dependency,  Postural dysfunction, Decreased strength  Visit Diagnosis: Other abnormalities of gait and mobility  Unsteadiness on feet  Muscle weakness (generalized)  History of falling  Stiffness of right hip, not elsewhere classified     Problem List Patient Active Problem List   Diagnosis Date Noted  . Memory loss 11/23/2017  . Takotsubo cardiomyopathy   . Hypertensive heart disease   . Brain aneurysm 04/13/2016  . Dyslipidemia   . CAD in native artery   . PAC (premature atrial contraction) 08/14/2014  . PVC's (premature ventricular contractions) 08/14/2014  . Palpitations 11/02/2013  . Essential hypertension, benign 11/01/2013  . Peripheral vascular disease, unspecified ([place]) 11/01/2013    , PT, DPT 05/19/2018, 12:03 PM  [facility] [address] [place], [place], [address] Phone: [phone]   Fax:  [phone]  Name: [name] MRN: [id number] Date of Birth: 1940-10-30

## 2018-05-20 ENCOUNTER — Inpatient Hospital Stay: Admit: 2018-05-20 | Payer: MEDICARE | Primary: Internal Medicine

## 2018-05-20 ENCOUNTER — Ambulatory Visit: Admit: 2018-05-20 | Discharge: 2018-05-20 | Payer: MEDICARE | Attending: Internal Medicine | Primary: Internal Medicine

## 2018-05-20 ENCOUNTER — Ambulatory Visit: Attending: Internal Medicine | Primary: Internal Medicine

## 2018-05-20 DIAGNOSIS — Z0000 Encounter for general adult medical examination without abnormal findings: Secondary | ICD-10-CM

## 2018-05-20 MED ORDER — CHLORTHALIDONE 25 MG TAB
25 mg | ORAL_TABLET | Freq: Every day | ORAL | 1 refills | Status: DC
Start: 2018-05-20 — End: 2018-10-04

## 2018-05-20 MED ORDER — TELMISARTAN 80 MG TAB
80 mg | ORAL_TABLET | Freq: Every day | ORAL | 1 refills | Status: DC
Start: 2018-05-20 — End: 2018-10-04

## 2018-05-20 NOTE — Progress Notes (Signed)
Chief Complaint   Patient presents with   ??? Hypertension     follow up     1. Have you been to the ER, urgent care clinic since your last visit?  Hospitalized since your last visit?No    2. Have you seen or consulted any other health care providers outside of the [facility] since your last visit?  Include any pap smears or colon screening. No

## 2018-05-20 NOTE — Progress Notes (Signed)
This is the Subsequent Medicare Annual Wellness Exam, performed 12 months or more after the Initial AWV or the last Subsequent AWV    I have reviewed the patient's medical history in detail and updated the computerized patient record.      Reports a lot of stress caring for her brother Sonny w DM and foot wounds and edema. She is taking him to his appts.       Weight is stable  Wt Readings from Last 3 Encounters:   05/20/18 193 lb (87.5 kg)   05/17/17 193 lb 6.4 oz (87.7 kg)   01/09/16 207 lb (93.9 kg)     Hypertension  Hypertension ROS: taking medications as instructed, no medication side effects noted, no TIA's, no chest pain on exertion, no dyspnea on exertion, no swelling of ankles     reports that she has never smoked. She has never used smokeless tobacco.    reports that she does not drink alcohol.   BP Readings from Last 2 Encounters:   05/20/18 154/76   05/17/17 134/78       History     Past Medical History:   Diagnosis Date   ??? Essential hypertension    ??? GERD (gastroesophageal reflux disease)    ??? Hematuria 01/2010    Dr. York Cerise, due to catheter, blood clot. [place] nl 2012   ??? 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  Dr. Stacie Acres   ??? Mixed hyperlipidemia    ??? Osteopenia 01/2016   ??? Spider varicose veins    ??? Vitamin D deficiency 02/2012      Past Surgical History:   Procedure Laterality Date   ??? COLONOSCOPY  01/2010    normal   ??? HX KNEE ARTHROSCOPY      x4   ??? HX KNEE REPLACEMENT      left knee replacement, Dr. Stacie Acres     Current Outpatient Medications   Medication Sig Dispense Refill   ??? telmisartan-hydroCHLOROthiazide (MICARDIS HCT) 80-25 mg per tablet TAKE 1 TABLET BY MOUTH DAILY 90 Tab 2   ??? naproxen sodium (ALEVE) 220 mg cap Take 1 Tab by mouth daily.     ??? Cholecalciferol, Vitamin D3, (VITAMIN D3) 2,000 unit cap capsule Take 2,000 Units by mouth daily. 30 Cap 5   ??? multivit-min-iron-FA-lutein (CENTRUM SILVER WOMEN) 8 mg iron-400 mcg-300 mcg tab Take 1 daily 30 Tab 11     Allergies   Allergen Reactions   ??? Codeine Nausea Only    ??? Lisinopril Other (comments)     Facial flushing   ??? Tylenol-Codeine #2 Nausea and Vomiting     Family History   Problem Relation Age of Onset   ??? Hypertension Mother    ??? Kidney Disease Mother    ??? Heart Disease Mother         afib   ??? Hypertension Brother    ??? Heart Disease Brother    ??? Lung Disease Father         emphysema   ??? Lung Disease Brother    ??? Diabetes Neg Hx    ??? Cancer Neg Hx      Social History     Tobacco Use   ??? Smoking status: Never Smoker   ??? Smokeless tobacco: Never Used   Substance Use Topics   ??? Alcohol use: No     Patient Active Problem List   Diagnosis Code   ??? Knee pain M25.569   ??? Mixed hyperlipidemia E78.2   ???  HTN (hypertension) I10   ??? Vitamin D deficiency E55.9   ??? Spider varicose veins I86.8   ??? Osteopenia M85.80       Depression Risk Factor Screening:     3 most recent PHQ Screens 05/20/2018   PHQ Not Done -   Little interest or pleasure in doing things Not at all   Feeling down, depressed, irritable, or hopeless Not at all   Total Score PHQ 2 0     Alcohol Risk Factor Screening:   You do not drink alcohol or very rarely.    Functional Ability and Level of Safety:   Hearing Loss  Hearing is good.    Activities of Daily Living  The home contains: no safety equipment.  Patient does total self care    Fall Risk  Fall Risk Assessment, last 12 mths 05/20/2018   Able to walk? Yes   Fall in past 12 months? No       Abuse Screen  Patient is not abused    Cognitive Screening   Evaluation of Cognitive Function:  Has your family/caregiver stated any concerns about your memory: no  Normal    Patient Care Team   Patient Care Team:  [place], Balinda Quails, MD as PCP - General (Internal Medicine)  Shanda Howells, Balinda Quails, MD as PCP - [facility] Empaneled Provider  Eustaquio Boyden, MD (Urology)    Assessment/Plan   Education and counseling provided:  Are appropriate based on today's review and evaluation    Diagnoses and all orders for this visit:    1. Medicare annual wellness visit, subsequent    2. Screening for  depression  -     DEPRESSION SCREEN ANNUAL    3. Essential hypertension  based on my history, the overall control of this problem borderline controlled.    Change to chlorthalidone and micardis  -     CBC W/O DIFF  -     METABOLIC PANEL, COMPREHENSIVE    5. Mixed hyperlipidemia  Mild, borderline controlled  The patient is asked to make an attempt to improve diet and exercise patterns to aid in medical management of this problem.  -     LIPID PANEL  -     METABOLIC PANEL, COMPREHENSIVE    6. Vitamin D deficiency  She is taking a vitamin D supplement.  -     VITAMIN D, 25 HYDROXY    Discussed the patient's BMI with her.  The BMI follow up plan is as follows:     dietary management education, guidance, and counseling  encourage exercise  monitor weight  prescribed dietary intake    An After Visit Summary was printed and given to the patient.    There are no preventive care reminders to display for this patient.    Discussed the patient's BMI with her.  The BMI follow up plan is as follows:     dietary management education, guidance, and counseling  encourage exercise  monitor weight  prescribed dietary intake    An After Visit Summary was printed and given to the patient.

## 2018-05-20 NOTE — Progress Notes (Signed)
Chief Complaint   Patient presents with   ??? Hypertension     follow up     1. Have you been to the ER, urgent care clinic since your last visit?  Hospitalized since your last visit?No    2. Have you seen or consulted any other health care providers outside of the [facility] since your last visit?  Include any pap smears or colon screening. No

## 2018-05-20 NOTE — Progress Notes (Signed)
This is the Subsequent Medicare Annual Wellness Exam, performed 12 months or more after the Initial AWV or the last Subsequent AWV    I have reviewed the patient's medical history in detail and updated the computerized patient record.      Reports a lot of stress caring for her brother Sonny w DM and foot wounds and edema. She is taking him to his appts.       Weight is stable  Wt Readings from Last 3 Encounters:   05/20/18 193 lb (87.5 kg)   05/17/17 193 lb 6.4 oz (87.7 kg)   01/09/16 207 lb (93.9 kg)     Hypertension  Hypertension ROS: taking medications as instructed, no medication side effects noted, no TIA's, no chest pain on exertion, no dyspnea on exertion, no swelling of ankles     reports that she has never smoked. She has never used smokeless tobacco.    reports that she does not drink alcohol.   BP Readings from Last 2 Encounters:   05/20/18 154/76   05/17/17 134/78       History     Past Medical History:   Diagnosis Date   ??? Essential hypertension    ??? GERD (gastroesophageal reflux disease)    ??? Hematuria 01/2010    Dr. York Cerise, due to catheter, blood clot. [place] nl 2012   ??? 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  Dr. Stacie Acres   ??? Mixed hyperlipidemia    ??? Osteopenia 01/2016   ??? Spider varicose veins    ??? Vitamin D deficiency 02/2012      Past Surgical History:   Procedure Laterality Date   ??? COLONOSCOPY  01/2010    normal   ??? HX KNEE ARTHROSCOPY      x4   ??? HX KNEE REPLACEMENT      left knee replacement, Dr. Stacie Acres     Current Outpatient Medications   Medication Sig Dispense Refill   ??? telmisartan-hydroCHLOROthiazide (MICARDIS HCT) 80-25 mg per tablet TAKE 1 TABLET BY MOUTH DAILY 90 Tab 2   ??? naproxen sodium (ALEVE) 220 mg cap Take 1 Tab by mouth daily.     ??? Cholecalciferol, Vitamin D3, (VITAMIN D3) 2,000 unit cap capsule Take 2,000 Units by mouth daily. 30 Cap 5   ??? multivit-min-iron-FA-lutein (CENTRUM SILVER WOMEN) 8 mg iron-400 mcg-300 mcg tab Take 1 daily 30 Tab 11     Allergies   Allergen Reactions    ??? Codeine Nausea Only   ??? Lisinopril Other (comments)     Facial flushing   ??? Tylenol-Codeine #2 Nausea and Vomiting     Family History   Problem Relation Age of Onset   ??? Hypertension Mother    ??? Kidney Disease Mother    ??? Heart Disease Mother         afib   ??? Hypertension Brother    ??? Heart Disease Brother    ??? Lung Disease Father         emphysema   ??? Lung Disease Brother    ??? Diabetes Neg Hx    ??? Cancer Neg Hx      Social History     Tobacco Use   ??? Smoking status: Never Smoker   ??? Smokeless tobacco: Never Used   Substance Use Topics   ??? Alcohol use: No     Patient Active Problem List   Diagnosis Code   ??? Knee pain M25.569   ??? Mixed hyperlipidemia E78.2   ???  HTN (hypertension) I10   ??? Vitamin D deficiency E55.9   ??? Spider varicose veins I86.8   ??? Osteopenia M85.80       Depression Risk Factor Screening:     3 most recent PHQ Screens 05/20/2018   PHQ Not Done -   Little interest or pleasure in doing things Not at all   Feeling down, depressed, irritable, or hopeless Not at all   Total Score PHQ 2 0     Alcohol Risk Factor Screening:   You do not drink alcohol or very rarely.    Functional Ability and Level of Safety:   Hearing Loss  Hearing is good.    Activities of Daily Living  The home contains: no safety equipment.  Patient does total self care    Fall Risk  Fall Risk Assessment, last 12 mths 05/20/2018   Able to walk? Yes   Fall in past 12 months? No       Abuse Screen  Patient is not abused    Cognitive Screening   Evaluation of Cognitive Function:  Has your family/caregiver stated any concerns about your memory: no  Normal    Patient Care Team   Patient Care Team:  [place], Balinda Quails, MD as PCP - General (Internal Medicine)  Shanda Howells, Balinda Quails, MD as PCP - [facility] Empaneled Provider  Eustaquio Boyden, MD (Urology)    Assessment/Plan   Education and counseling provided:  Are appropriate based on today's review and evaluation    Diagnoses and all orders for this visit:    1. Medicare annual wellness visit, subsequent     2. Screening for depression  -     DEPRESSION SCREEN ANNUAL    3. Essential hypertension  based on my history, the overall control of this problem borderline controlled.    Change to chlorthalidone and micardis  -     CBC W/O DIFF  -     METABOLIC PANEL, COMPREHENSIVE    5. Mixed hyperlipidemia  Mild, borderline controlled  The patient is asked to make an attempt to improve diet and exercise patterns to aid in medical management of this problem.  -     LIPID PANEL  -     METABOLIC PANEL, COMPREHENSIVE    6. Vitamin D deficiency  She is taking a vitamin D supplement.  -     VITAMIN D, 25 HYDROXY    Discussed the patient's BMI with her.  The BMI follow up plan is as follows:     dietary management education, guidance, and counseling  encourage exercise  monitor weight  prescribed dietary intake    An After Visit Summary was printed and given to the patient.    There are no preventive care reminders to display for this patient.    Discussed the patient's BMI with her.  The BMI follow up plan is as follows:     dietary management education, guidance, and counseling  encourage exercise  monitor weight  prescribed dietary intake    An After Visit Summary was printed and given to the patient.

## 2018-05-20 NOTE — Patient Instructions (Signed)
Body Mass Index: Care Instructions  Your Care Instructions    Body mass index (BMI) can help you see if your weight is raising your risk for health problems. It uses a formula to compare how much you weigh with how tall you are.  ?? A BMI lower than 18.5 is considered underweight.  ?? A BMI between 18.5 and 24.9 is considered healthy.  ?? A BMI between 25 and 29.9 is considered overweight. A BMI of 30 or higher is considered obese.  If your BMI is in the normal range, it means that you have a lower risk for weight-related health problems. If your BMI is in the overweight or obese range, you may be at increased risk for weight-related health problems, such as high blood pressure, heart disease, stroke, arthritis or joint pain, and diabetes. If your BMI is in the underweight range, you may be at increased risk for health problems such as fatigue, lower protection (immunity) against illness, muscle loss, bone loss, hair loss, and hormone problems.  BMI is just one measure of your risk for weight-related health problems. You may be at higher risk for health problems if you are not active, you eat an unhealthy diet, or you drink too much alcohol or use tobacco products.  Follow-up care is a key part of your treatment and safety. Be sure to make and go to all appointments, and call your doctor if you are having problems. It's also a good idea to know your test results and keep a list of the medicines you take.  How can you care for yourself at home?  ?? Practice healthy eating habits. This includes eating plenty of fruits, vegetables, whole grains, lean protein, and low-fat dairy.  ?? If your doctor recommends it, get more exercise. Walking is a good choice. Bit by bit, increase the amount you walk every day. Try for at least 30 minutes on most days of the week.  ?? Do not smoke. Smoking can increase your risk for health problems. If you need help quitting, talk to your doctor about stop-smoking programs and  medicines. These can increase your chances of quitting for good.  ?? Limit alcohol to 2 drinks a day for men and 1 drink a day for women. Too much alcohol can cause health problems.  If you have a BMI higher than 25  ?? Your doctor may do other tests to check your risk for weight-related health problems. This may include measuring the distance around your waist. A waist measurement of more than 40 inches in men or 35 inches in women can increase the risk of weight-related health problems.  ?? Talk with your doctor about steps you can take to stay healthy or improve your health. You may need to make lifestyle changes to lose weight and stay healthy, such as changing your diet and getting regular exercise.  If you have a BMI lower than 18.5  ?? Your doctor may do other tests to check your risk for health problems.  ?? Talk with your doctor about steps you can take to stay healthy or improve your health. You may need to make lifestyle changes to gain or maintain weight and stay healthy, such as getting more healthy foods in your diet and doing exercises to build muscle.  Where can you learn more?  Go to http://www.healthwise.net/[employer].  Enter S176 in the search box to learn more about "Body Mass Index: Care Instructions."  Current as of: April 25, 2015  Content Version: 11.4  ??   2006-2017 [employer]. Care instructions adapted under license by [employer] (which disclaims liability or warranty for this information). If you have questions about a medical condition or this instruction, always ask your healthcare professional. Healthwise, Incorporated disclaims any warranty or liability for your use of this information.

## 2018-05-21 LAB — VITAMIN D 25 HYDROXY: Vit D, 25-Hydroxy: 21.8 ng/mL — ABNORMAL LOW (ref 30–100)

## 2018-05-21 LAB — CBC WITH AUTO DIFFERENTIAL
Basophils %: 1 % (ref 0–1)
Basophils Absolute: 0.1 10*3/uL (ref 0.0–0.1)
Eosinophils %: 1 % (ref 0–7)
Eosinophils Absolute: 0.1 10*3/uL (ref 0.0–0.4)
Granulocyte Absolute Count: 0 10*3/uL (ref 0.00–0.04)
Hematocrit: 44.8 % (ref 35.0–47.0)
Hemoglobin: 14 g/dL (ref 11.5–16.0)
Immature Granulocytes: 0 % (ref 0.0–0.5)
Lymphocytes %: 20 % (ref 12–49)
Lymphocytes Absolute: 1.5 10*3/uL (ref 0.8–3.5)
MCH: 28.3 PG (ref 26.0–34.0)
MCHC: 31.3 g/dL (ref 30.0–36.5)
MCV: 90.7 FL (ref 80.0–99.0)
MPV: 12.2 FL (ref 8.9–12.9)
Monocytes %: 6 % (ref 5–13)
Monocytes Absolute: 0.4 10*3/uL (ref 0.0–1.0)
NRBC Absolute: 0 10*3/uL (ref 0.00–0.01)
Neutrophils %: 72 % (ref 32–75)
Neutrophils Absolute: 5.3 10*3/uL (ref 1.8–8.0)
Nucleated RBCs: 0 PER 100 WBC
Platelets: 219 10*3/uL (ref 150–400)
RBC: 4.94 M/uL (ref 3.80–5.20)
RDW: 13.2 % (ref 11.5–14.5)
WBC: 7.4 10*3/uL (ref 3.6–11.0)

## 2018-05-21 LAB — COMPREHENSIVE METABOLIC PANEL
ALT: 18 U/L (ref 12–78)
AST: 12 U/L — ABNORMAL LOW (ref 15–37)
Albumin/Globulin Ratio: 1.4 (ref 1.1–2.2)
Albumin: 4 g/dL (ref 3.5–5.0)
Alkaline Phosphatase: 106 U/L (ref 45–117)
Anion Gap: 5 mmol/L (ref 5–15)
BUN: 21 MG/DL — ABNORMAL HIGH (ref 6–20)
Bun/Cre Ratio: 22 — ABNORMAL HIGH (ref 12–20)
CO2: 29 mmol/L (ref 21–32)
Calcium: 9.2 MG/DL (ref 8.5–10.1)
Chloride: 105 mmol/L (ref 97–108)
Creatinine: 0.95 MG/DL (ref 0.55–1.02)
EGFR IF NonAfrican American: 57 mL/min/{1.73_m2} — ABNORMAL LOW (ref >60)
GFR African American: >60 mL/min/{1.73_m2} (ref >60)
Globulin: 2.9 g/dL (ref 2.0–4.0)
Glucose: 96 mg/dL (ref 65–100)
Potassium: 4.7 mmol/L (ref 3.5–5.1)
Sodium: 139 mmol/L (ref 136–145)
Total Bilirubin: 0.5 MG/DL (ref 0.2–1.0)
Total Protein: 6.9 g/dL (ref 6.4–8.2)

## 2018-05-21 LAB — LIPID PANEL
CHOL/HDL Ratio: 3.8 (ref 0.0–5.0)
Chol/HDL Ratio: 3.8 (ref 0.0–5.0)
Cholesterol, Total: 245 MG/DL — ABNORMAL HIGH (ref <200)
Cholesterol, total: 245 MG/DL — ABNORMAL HIGH (ref <200)
HDL Cholesterol: 64 MG/DL
HDL: 64 MG/DL
LDL Calculated: 151.6 MG/DL — ABNORMAL HIGH (ref 0–100)
LDL, calculated: 151.6 MG/DL — ABNORMAL HIGH (ref 0–100)
Triglyceride: 147 MG/DL (ref <150)
Triglycerides: 147 MG/DL (ref <150)
VLDL Cholesterol Calculated: 29.4 MG/DL
VLDL, calculated: 29.4 MG/DL

## 2018-05-21 LAB — METABOLIC PANEL, COMPREHENSIVE
A-G Ratio: 1.4 (ref 1.1–2.2)
ALT (SGPT): 18 U/L (ref 12–78)
AST (SGOT): 12 U/L — ABNORMAL LOW (ref 15–37)
Albumin: 4 g/dL (ref 3.5–5.0)
Alk. phosphatase: 106 U/L (ref 45–117)
Anion gap: 5 mmol/L (ref 5–15)
BUN/Creatinine ratio: 22 — ABNORMAL HIGH (ref 12–20)
BUN: 21 MG/DL — ABNORMAL HIGH (ref 6–20)
Bilirubin, total: 0.5 MG/DL (ref 0.2–1.0)
CO2: 29 mmol/L (ref 21–32)
Calcium: 9.2 MG/DL (ref 8.5–10.1)
Chloride: 105 mmol/L (ref 97–108)
Creatinine: 0.95 MG/DL (ref 0.55–1.02)
GFR est AA: >60 mL/min/{1.73_m2} (ref >60)
GFR est non-AA: 57 mL/min/{1.73_m2} — ABNORMAL LOW (ref >60)
Globulin: 2.9 g/dL (ref 2.0–4.0)
Glucose: 96 mg/dL (ref 65–100)
Potassium: 4.7 mmol/L (ref 3.5–5.1)
Protein, total: 6.9 g/dL (ref 6.4–8.2)
Sodium: 139 mmol/L (ref 136–145)

## 2018-05-21 LAB — CBC WITH AUTOMATED DIFF
ABS. BASOPHILS: 0.1 10*3/uL (ref 0.0–0.1)
ABS. EOSINOPHILS: 0.1 10*3/uL (ref 0.0–0.4)
ABS. IMM. GRANS.: 0 10*3/uL (ref 0.00–0.04)
ABS. LYMPHOCYTES: 1.5 10*3/uL (ref 0.8–3.5)
ABS. MONOCYTES: 0.4 10*3/uL (ref 0.0–1.0)
ABS. NEUTROPHILS: 5.3 10*3/uL (ref 1.8–8.0)
ABSOLUTE NRBC: 0 10*3/uL (ref 0.00–0.01)
BASOPHILS: 1 % (ref 0–1)
EOSINOPHILS: 1 % (ref 0–7)
HCT: 44.8 % (ref 35.0–47.0)
HGB: 14 g/dL (ref 11.5–16.0)
IMMATURE GRANULOCYTES: 0 % (ref 0.0–0.5)
LYMPHOCYTES: 20 % (ref 12–49)
MCH: 28.3 PG (ref 26.0–34.0)
MCHC: 31.3 g/dL (ref 30.0–36.5)
MCV: 90.7 FL (ref 80.0–99.0)
MONOCYTES: 6 % (ref 5–13)
MPV: 12.2 FL (ref 8.9–12.9)
NEUTROPHILS: 72 % (ref 32–75)
NRBC: 0 PER 100 WBC
PLATELET: 219 10*3/uL (ref 150–400)
RBC: 4.94 M/uL (ref 3.80–5.20)
RDW: 13.2 % (ref 11.5–14.5)
WBC: 7.4 10*3/uL (ref 3.6–11.0)

## 2018-05-21 LAB — VITAMIN D, 25 HYDROXY: Vitamin D 25-Hydroxy: 21.8 ng/mL — ABNORMAL LOW (ref 30–100)

## 2018-05-26 ENCOUNTER — Ambulatory Visit: Payer: Medicare Other | Admitting: Neurology

## 2018-08-23 ENCOUNTER — Encounter: Attending: Internal Medicine | Primary: Internal Medicine

## 2018-10-04 MED ORDER — CHLORTHALIDONE 25 MG TAB
25 mg | ORAL_TABLET | Freq: Every day | ORAL | 1 refills | Status: DC
Start: 2018-10-04 — End: 2019-07-12

## 2018-10-04 MED ORDER — TELMISARTAN 80 MG TAB
80 mg | ORAL_TABLET | Freq: Every day | ORAL | 1 refills | Status: DC
Start: 2018-10-04 — End: 2019-07-12

## 2018-11-06 ENCOUNTER — Other Ambulatory Visit: Payer: Self-pay | Admitting: Cardiology

## 2018-12-06 ENCOUNTER — Telehealth: Admit: 2018-12-06 | Payer: MEDICARE | Attending: Nurse Practitioner | Primary: Internal Medicine

## 2018-12-06 ENCOUNTER — Telehealth: Attending: Nurse Practitioner | Primary: Internal Medicine

## 2018-12-06 DIAGNOSIS — J0140 Acute pansinusitis, unspecified: Secondary | ICD-10-CM

## 2018-12-06 MED ORDER — CEFDINIR 300 MG CAP
300 mg | ORAL_CAPSULE | Freq: Two times a day (BID) | ORAL | 0 refills | Status: DC
Start: 2018-12-06 — End: 2019-01-25

## 2018-12-06 MED ORDER — GUAIFENESIN 600 MG TABLET,EXTENDED RELEASE BIPHASIC 12 HR
600 mg | ORAL_TABLET | Freq: Two times a day (BID) | ORAL | 0 refills | Status: DC
Start: 2018-12-06 — End: 2019-01-25

## 2018-12-06 NOTE — Progress Notes (Signed)
Consent: Valerie Barry, who was seen by synchronous (real-time) telephone visit, and/or her healthcare decision maker, is aware that this patient-initiated, Telehealth encounter on 12/06/2018 is a billable service, with coverage as determined by her insurance carrier. She is aware that she may receive a bill and has provided verbal consent to proceed: Yes.     Visit by Telephone -- lasted 8 minutes.  712  Subjective:   Valerie Barry is a 78 y.o. female who was seen for Sinus Pain (sx started thursday- pt works out in the yard all the time. no fever ); Headache; and Nasal Congestion    Complains of bilateral maxillary sinus pressure and pain, nasal congestion, sneezing, post nasal drainage and cough for the past 7 days.  She works outdoors in her garden on a daily basis and was cutting the grass last week when she began sneezing.  By the next day, she had significant sinus congestion and post nasal drainage.  Has since developed sinus pain and fatigue.  Denies fever, chills, shortness of breath.  Has been taking Claritin without help and using Vicks with minimal improvement.   She has been staying in house and ventures out every 3 weeks to grocery shop; has been wearing mask and gloves when potentially in contact with other people.  Family members have been running errands for her and have had minimal contact.  Has had issues with occasional sinus infections when allergies flared up in the past.    Current Outpatient Medications   Medication Sig   ??? cefdinir (OMNICEF) 300 mg capsule Take 1 Cap by mouth two (2) times a day.   ??? guaiFENesin ER (Mucinex) 600 mg ER tablet Take 1 Tab by mouth two (2) times a day.   ??? telmisartan (MICARDIS) 80 mg tablet Take 1 Tab by mouth daily.   ??? chlorthalidone (HYGROTEN) 25 mg tablet Take 1 Tab by mouth daily. For blood pressure   ??? naproxen sodium (ALEVE) 220 mg cap Take 1 Tab by mouth daily.   ??? Cholecalciferol, Vitamin D3, (VITAMIN D3) 2,000 unit cap capsule Take  2,000 Units by mouth daily.   ??? multivit-min-iron-FA-lutein (CENTRUM SILVER WOMEN) 8 mg iron-400 mcg-300 mcg tab Take 1 daily     No current facility-administered medications for this visit.        Allergies   Allergen Reactions   ??? Codeine Nausea Only   ??? Lisinopril Other (comments)     Facial flushing   ??? Tylenol-Codeine #2 Nausea and Vomiting       Past Medical History:   Diagnosis Date   ??? Essential hypertension    ??? GERD (gastroesophageal reflux disease)    ??? Hematuria 01/2010    Dr. York Cerise, due to catheter, blood clot. [place] nl 2012   ??? 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  Dr. Stacie Acres   ??? Mixed hyperlipidemia    ??? Osteopenia 01/2016   ??? Spider varicose veins    ??? Vitamin D deficiency 02/2012       ROS  All other systems reviewed and negative, unless mentioned in HPI    Objective:   Vital Signs: (As obtained by patient/caregiver at home)  Visit Vitals  Temp 98.7 ??F (37.1 ??C) (Temporal)   Ht 5' 3.5" (1.613 m)   Wt 197 lb (89.4 kg) Comment: per pt   BMI 34.35 kg/m??          No physical exam performed since this was a telephone visit only.  Assessment & Plan:   Diagnoses and all orders for this visit:    1. Acute pansinusitis, recurrence not specified  -     cefdinir (OMNICEF) 300 mg capsule; Take 1 Cap by mouth two (2) times a day.  -     guaiFENesin ER (Mucinex) 600 mg ER tablet; Take 1 Tab by mouth two (2) times a day.    2. History of seasonal allergies -- advised her to wear her mask when outdoors due to seasonal allergies.  Can continue with OTC Claritin as needed.            We discussed the expected course, resolution and complications of the diagnosis(es) in detail.  Medication risks, benefits, costs, interactions, and alternatives were discussed as indicated.  I advised her to contact the office if her condition worsens, changes or fails to improve as anticipated. She expressed understanding with the diagnosis(es) and plan.     Valerie Barry is a 78 y.o. female who was evaluated by a telephone  encounter for concerns as above. Patient identification was verified prior to start of the visit. A caregiver was present when appropriate. Due to this being a Scientist, physiological (During TIRWE-31 public health emergency), evaluation of the following organ systems was limited: Vitals/Constitutional/EENT/Resp/CV/GI/GU/MS/Neuro/Skin/Heme-Lymph-Imm.  Pursuant to the emergency declaration under the [place], 1135 waiver authority and the [employer] Act, this Virtual Visit was conducted, with patient's (and/or legal guardian's) consent, to reduce the patient's risk of exposure to COVID-19 and provide necessary medical care.     Services were provided through a synchronous discussion virtually to substitute for in-person clinic visit. I was in the office. The patient was at home.     Jocelyn Lamer, NP

## 2018-12-06 NOTE — Patient Instructions (Signed)
Sinusitis: Care Instructions  Your Care Instructions    Sinusitis is an infection of the lining of the sinus cavities in your head. Sinusitis often follows a cold. It causes pain and pressure in your head and face.  In most cases, sinusitis gets better on its own in 1 to 2 weeks. But some mild symptoms may last for several weeks. Sometimes antibiotics are needed.  Follow-up care is a key part of your treatment and safety. Be sure to make and go to all appointments, and call your doctor if you are having problems. It's also a good idea to know your test results and keep a list of the medicines you take.  How can you care for yourself at home?  ?? Take an over-the-counter pain medicine, such as acetaminophen (Tylenol), ibuprofen (Advil, Motrin), or naproxen (Aleve). Read and follow all instructions on the label.  ?? If the doctor prescribed antibiotics, take them as directed. Do not stop taking them just because you feel better. You need to take the full course of antibiotics.  ?? Be careful when taking over-the-counter cold or flu medicines and Tylenol at the same time. Many of these medicines have acetaminophen, which is Tylenol. Read the labels to make sure that you are not taking more than the recommended dose. Too much acetaminophen (Tylenol) can be harmful.  ?? Breathe warm, moist air from a steamy shower, a hot bath, or a sink filled with hot water. Avoid cold, dry air. Using a humidifier in your home may help. Follow the directions for cleaning the machine.  ?? Use saline (saltwater) nasal washes to help keep your nasal passages open and wash out mucus and bacteria. You can buy saline nose drops at a grocery store or drugstore. Or you can make your own at home by adding 1 teaspoon of salt and 1 teaspoon of baking soda to 2 cups of distilled water. If you make your own, fill a bulb syringe with the solution, insert the tip into your nostril, and squeeze gently. Blow your nose.   ?? Put a hot, wet towel or a warm gel pack on your face 3 or 4 times a day for 5 to 10 minutes each time.  ?? Try a decongestant nasal spray like oxymetazoline (Afrin). Do not use it for more than 3 days in a row. Using it for more than 3 days can make your congestion worse.  When should you call for help?  Call your doctor now or seek immediate medical care if:  ?? ?? You have new or worse swelling or redness in your face or around your eyes.   ?? ?? You have a new or higher fever.   ??Watch closely for changes in your health, and be sure to contact your doctor if:  ?? ?? You have new or worse facial pain.   ?? ?? The mucus from your nose becomes thicker (like pus) or has new blood in it.   ?? ?? You are not getting better as expected.   Where can you learn more?  Go to http://www.healthwise.net/GoodHelpConnections  Enter I933 in the search box to learn more about "Sinusitis: Care Instructions."  Current as of: July 28, 2019Content Version: 12.4  ?? 2006-2020 [employer] Inc[employer].  Care instructions adapted under license by [employer] (which disclaims liability or warranty for this information). If you have questions about a medical condition or this instruction, always ask your healthcare professional. Healthwise, Incorporated disclaims any warranty or liability for your use   of this information.         Seasonal Allergies: Care Instructions  Your Care Instructions  Allergies occur when your body's defense system (immune system) overreacts to certain substances. The immune system treats a harmless substance as if it were a harmful germ or virus. Many things can cause this to happen. Examples include pollens, medicine, food, dust, animal dander, and mold.  Your allergies are seasonal if you have symptoms just at certain times of the year. In that case, you are probably allergic to pollens from certain trees, grasses, or weeds.  Allergies can be mild or severe. Over-the-counter allergy medicine may  help with some symptoms. Read and follow all instructions on the label.  Managing your allergies is an important part of staying healthy. Your doctor may suggest that you have tests to help find the cause of your allergies. When you know what things trigger your symptoms, you can avoid them. This can prevent allergy symptoms and other health problems.  In some cases, immunotherapy might help. For this treatment, you get shots or use pills that have a small amount of certain allergens in them. Your body "gets used to" the allergen, so you react less to it over time. This kind of treatment may help prevent or reduce some allergy symptoms.  Follow-up care is a key part of your treatment and safety. Be sure to make and go to all appointments, and call your doctor if you are having problems. It's also a good idea to know your test results and keep a list of the medicines you take.  How can you care for yourself at home?  ?? Be safe with medicines. Take your medicines exactly as prescribed. Call your doctor if you think you are having a problem with your medicine.  ?? During your allergy season, keep windows closed. If you need to use air-conditioning, change or clean all filters every month. Take a shower and change your clothes after you have been outside.  ?? Stay inside when pollen counts are high. Vacuum once or twice a week. Use a vacuum cleaner with a HEPA filter or a double-thickness filter.  When should you call for help?  Give an epinephrine shot if:  ?? ?? You think you are having a severe allergic reaction.   ??After giving an epinephrine shot, call  911, even if you feel better.  ??Call 911 if:  ?? ?? You have symptoms of a severe allergic reaction. These may include:  ? Sudden raised, red areas (hives) all over your body.  ? Swelling of the throat, mouth, lips, or tongue.  ? Trouble breathing.  ? Passing out (losing consciousness). Or you may feel very lightheaded or suddenly feel weak, confused, or restless.    ?? ?? You have been given an epinephrine shot, even if you feel better.   ??Call your doctor now or seek immediate medical care if:  ?? ?? You have symptoms of an allergic reaction, such as:  ? A rash or hives (raised, red areas on the skin).  ? Itching.  ? Swelling.  ? Belly pain, nausea, or vomiting.   ??Watch closely for changes in your health, and be sure to contact your doctor if:  ?? ?? You do not get better as expected.   Where can you learn more?  Go to StreetWrestling.at  Enter J912 in the search box to learn more about "Seasonal Allergies: Care Instructions."  Current as of: October 6, 2019Content Version: 12.4  ?? 2006-2020  Healthwise, Incorporated.  Care instructions adapted under license by [employer] (which disclaims liability or warranty for this information). If you have questions about a medical condition or this instruction, always ask your healthcare professional. [place] any warranty or liability for your use of this information.

## 2018-12-06 NOTE — Progress Notes (Signed)
Consent: Valerie Barry, who was seen by synchronous (real-time) telephone visit, and/or her healthcare decision maker, is aware that this patient-initiated, Telehealth encounter on 12/06/2018 is a billable service, with coverage as determined by her insurance carrier. She is aware that she may receive a bill and has provided verbal consent to proceed: Yes.     Visit by Telephone -- lasted 8 minutes.  712  Subjective:   Valerie Barry is a 78 y.o. female who was seen for Sinus Pain (sx started thursday- pt works out in the yard all the time. no fever ); Headache; and Nasal Congestion    Complains of bilateral maxillary sinus pressure and pain, nasal congestion, sneezing, post nasal drainage and cough for the past 7 days.  She works outdoors in her garden on a daily basis and was cutting the grass last week when she began sneezing.  By the next day, she had significant sinus congestion and post nasal drainage.  Has since developed sinus pain and fatigue.  Denies fever, chills, shortness of breath.  Has been taking Claritin without help and using Vicks with minimal improvement.   She has been staying in house and ventures out every 3 weeks to grocery shop; has been wearing mask and gloves when potentially in contact with other people.  Family members have been running errands for her and have had minimal contact.  Has had issues with occasional sinus infections when allergies flared up in the past.    Current Outpatient Medications   Medication Sig   ??? cefdinir (OMNICEF) 300 mg capsule Take 1 Cap by mouth two (2) times a day.   ??? guaiFENesin ER (Mucinex) 600 mg ER tablet Take 1 Tab by mouth two (2) times a day.   ??? telmisartan (MICARDIS) 80 mg tablet Take 1 Tab by mouth daily.   ??? chlorthalidone (HYGROTEN) 25 mg tablet Take 1 Tab by mouth daily. For blood pressure   ??? naproxen sodium (ALEVE) 220 mg cap Take 1 Tab by mouth daily.   ??? Cholecalciferol, Vitamin D3, (VITAMIN D3) 2,000 unit cap capsule Take 2,000 Units  by mouth daily.   ??? multivit-min-iron-FA-lutein (CENTRUM SILVER WOMEN) 8 mg iron-400 mcg-300 mcg tab Take 1 daily     No current facility-administered medications for this visit.        Allergies   Allergen Reactions   ??? Codeine Nausea Only   ??? Lisinopril Other (comments)     Facial flushing   ??? Tylenol-Codeine #2 Nausea and Vomiting       Past Medical History:   Diagnosis Date   ??? Essential hypertension    ??? GERD (gastroesophageal reflux disease)    ??? Hematuria 01/2010    Dr. York Cerise, due to catheter, blood clot. [place] nl 2012   ??? 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  Dr. Stacie Acres   ??? Mixed hyperlipidemia    ??? Osteopenia 01/2016   ??? Spider varicose veins    ??? Vitamin D deficiency 02/2012       ROS  All other systems reviewed and negative, unless mentioned in HPI    Objective:   Vital Signs: (As obtained by patient/caregiver at home)  Visit Vitals  Temp 98.7 ??F (37.1 ??C) (Temporal)   Ht 5' 3.5" (1.613 m)   Wt 197 lb (89.4 kg) Comment: per pt   BMI 34.35 kg/m??          No physical exam performed since this was a telephone visit only.  Assessment & Plan:   Diagnoses and all orders for this visit:    1. Acute pansinusitis, recurrence not specified  -     cefdinir (OMNICEF) 300 mg capsule; Take 1 Cap by mouth two (2) times a day.  -     guaiFENesin ER (Mucinex) 600 mg ER tablet; Take 1 Tab by mouth two (2) times a day.    2. History of seasonal allergies -- advised her to wear her mask when outdoors due to seasonal allergies.  Can continue with OTC Claritin as needed.            We discussed the expected course, resolution and complications of the diagnosis(es) in detail.  Medication risks, benefits, costs, interactions, and alternatives were discussed as indicated.  I advised her to contact the office if her condition worsens, changes or fails to improve as anticipated. She expressed understanding with the diagnosis(es) and plan.     Valerie Barry is a 78 y.o. female who was evaluated by a telephone encounter for concerns as above.  Patient identification was verified prior to start of the visit. A caregiver was present when appropriate. Due to this being a Scientist, physiological (During JQBHA-19 public health emergency), evaluation of the following organ systems was limited: Vitals/Constitutional/EENT/Resp/CV/GI/GU/MS/Neuro/Skin/Heme-Lymph-Imm.  Pursuant to the emergency declaration under the [place], 1135 waiver authority and the [employer] Act, this Virtual Visit was conducted, with patient's (and/or legal guardian's) consent, to reduce the patient's risk of exposure to COVID-19 and provide necessary medical care.     Services were provided through a synchronous discussion virtually to substitute for in-person clinic visit. I was in the office. The patient was at home.     Jocelyn Lamer, NP

## 2018-12-17 ENCOUNTER — Emergency Department: Admit: 2018-12-17 | Payer: MEDICARE | Primary: Internal Medicine

## 2018-12-17 ENCOUNTER — Inpatient Hospital Stay
Admit: 2018-12-17 | Discharge: 2018-12-17 | Disposition: A | Discharge status: Home / Self Care | Payer: MEDICARE | Attending: Student in an Organized Health Care Education/Training Program

## 2018-12-17 DIAGNOSIS — N950 Postmenopausal bleeding: Secondary | ICD-10-CM

## 2018-12-17 LAB — CBC WITH AUTO DIFFERENTIAL
Basophils %: 1 % (ref 0–1)
Basophils Absolute: 0.1 10*3/uL (ref 0.0–0.1)
Eosinophils %: 1 % (ref 0–7)
Eosinophils Absolute: 0 10*3/uL (ref 0.0–0.4)
Granulocyte Absolute Count: 0 10*3/uL (ref 0.00–0.04)
Hematocrit: 42.8 % (ref 35.0–47.0)
Hemoglobin: 14 g/dL (ref 11.5–16.0)
Immature Granulocytes: 0 % (ref 0.0–0.5)
Lymphocytes %: 13 % (ref 12–49)
Lymphocytes Absolute: 1.1 10*3/uL (ref 0.8–3.5)
MCH: 28.8 PG (ref 26.0–34.0)
MCHC: 32.7 g/dL (ref 30.0–36.5)
MCV: 88.1 FL (ref 80.0–99.0)
MPV: 11 FL (ref 8.9–12.9)
Monocytes %: 6 % (ref 5–13)
Monocytes Absolute: 0.5 10*3/uL (ref 0.0–1.0)
NRBC Absolute: 0 10*3/uL (ref 0.00–0.01)
Neutrophils %: 79 % — ABNORMAL HIGH (ref 32–75)
Neutrophils Absolute: 6.7 10*3/uL (ref 1.8–8.0)
Nucleated RBCs: 0 PER 100 WBC
Platelets: 245 10*3/uL (ref 150–400)
RBC: 4.86 M/uL (ref 3.80–5.20)
RDW: 12.5 % (ref 11.5–14.5)
WBC: 8.4 10*3/uL (ref 3.6–11.0)

## 2018-12-17 LAB — COMPREHENSIVE METABOLIC PANEL
ALT: 14 U/L (ref 12–78)
AST: 11 U/L — ABNORMAL LOW (ref 15–37)
Albumin/Globulin Ratio: 0.9 — ABNORMAL LOW (ref 1.1–2.2)
Albumin: 3.5 g/dL (ref 3.5–5.0)
Alkaline Phosphatase: 98 U/L (ref 45–117)
Anion Gap: 5 mmol/L (ref 5–15)
BUN: 21 MG/DL — ABNORMAL HIGH (ref 6–20)
Bun/Cre Ratio: 16 (ref 12–20)
CO2: 31 mmol/L (ref 21–32)
Calcium: 8.9 MG/DL (ref 8.5–10.1)
Chloride: 105 mmol/L (ref 97–108)
Creatinine: 1.3 MG/DL — ABNORMAL HIGH (ref 0.55–1.02)
EGFR IF NonAfrican American: 40 mL/min/{1.73_m2} — ABNORMAL LOW (ref >60)
GFR African American: 48 mL/min/{1.73_m2} — ABNORMAL LOW (ref >60)
Globulin: 3.8 g/dL (ref 2.0–4.0)
Glucose: 124 mg/dL — ABNORMAL HIGH (ref 65–100)
Potassium: 3.3 mmol/L — ABNORMAL LOW (ref 3.5–5.1)
Sodium: 141 mmol/L (ref 136–145)
Total Bilirubin: 0.3 MG/DL (ref 0.2–1.0)
Total Protein: 7.3 g/dL (ref 6.4–8.2)

## 2018-12-17 LAB — METABOLIC PANEL, COMPREHENSIVE
A-G Ratio: 0.9 — ABNORMAL LOW (ref 1.1–2.2)
ALT (SGPT): 14 U/L (ref 12–78)
AST (SGOT): 11 U/L — ABNORMAL LOW (ref 15–37)
Albumin: 3.5 g/dL (ref 3.5–5.0)
Alk. phosphatase: 98 U/L (ref 45–117)
Anion gap: 5 mmol/L (ref 5–15)
BUN/Creatinine ratio: 16 (ref 12–20)
BUN: 21 MG/DL — ABNORMAL HIGH (ref 6–20)
Bilirubin, total: 0.3 MG/DL (ref 0.2–1.0)
CO2: 31 mmol/L (ref 21–32)
Calcium: 8.9 MG/DL (ref 8.5–10.1)
Chloride: 105 mmol/L (ref 97–108)
Creatinine: 1.3 MG/DL — ABNORMAL HIGH (ref 0.55–1.02)
GFR est AA: 48 mL/min/{1.73_m2} — ABNORMAL LOW (ref >60)
GFR est non-AA: 40 mL/min/{1.73_m2} — ABNORMAL LOW (ref >60)
Globulin: 3.8 g/dL (ref 2.0–4.0)
Glucose: 124 mg/dL — ABNORMAL HIGH (ref 65–100)
Potassium: 3.3 mmol/L — ABNORMAL LOW (ref 3.5–5.1)
Protein, total: 7.3 g/dL (ref 6.4–8.2)
Sodium: 141 mmol/L (ref 136–145)

## 2018-12-17 LAB — CBC WITH AUTOMATED DIFF
ABS. BASOPHILS: 0.1 10*3/uL (ref 0.0–0.1)
ABS. EOSINOPHILS: 0 10*3/uL (ref 0.0–0.4)
ABS. IMM. GRANS.: 0 10*3/uL (ref 0.00–0.04)
ABS. LYMPHOCYTES: 1.1 10*3/uL (ref 0.8–3.5)
ABS. MONOCYTES: 0.5 10*3/uL (ref 0.0–1.0)
ABS. NEUTROPHILS: 6.7 10*3/uL (ref 1.8–8.0)
ABSOLUTE NRBC: 0 10*3/uL (ref 0.00–0.01)
BASOPHILS: 1 % (ref 0–1)
EOSINOPHILS: 1 % (ref 0–7)
HCT: 42.8 % (ref 35.0–47.0)
HGB: 14 g/dL (ref 11.5–16.0)
IMMATURE GRANULOCYTES: 0 % (ref 0.0–0.5)
LYMPHOCYTES: 13 % (ref 12–49)
MCH: 28.8 PG (ref 26.0–34.0)
MCHC: 32.7 g/dL (ref 30.0–36.5)
MCV: 88.1 FL (ref 80.0–99.0)
MONOCYTES: 6 % (ref 5–13)
MPV: 11 FL (ref 8.9–12.9)
NEUTROPHILS: 79 % — ABNORMAL HIGH (ref 32–75)
NRBC: 0 PER 100 WBC
PLATELET: 245 10*3/uL (ref 150–400)
RBC: 4.86 M/uL (ref 3.80–5.20)
RDW: 12.5 % (ref 11.5–14.5)
WBC: 8.4 10*3/uL (ref 3.6–11.0)

## 2018-12-17 LAB — SAMPLES BEING HELD

## 2018-12-17 NOTE — ED Notes (Signed)
Patient discharged by provider.  Discharge paperwork reviewed and patient denies questions.  Leaves ambulatory and in no apparent distress

## 2018-12-17 NOTE — ED Triage Notes (Addendum)
Pt arrives ambulatory via EMS with complaints of vaginal bleeding that started around 10am.  Pt states she was "sitting down for breakfast and felt like she wet herself".  Says when she went to the bathroom she had wet through her underwear and pants with a large amount of blood ("1-2cups".  At the time she felt lightheaded, but feels better now and is only lightly spotting. Pt states she saw her doctor for a sinus infection on Tuesday and started an antibiotic, which gave her diarrhea so she stopped taking it after 3 days.  Denies any NV or pain.  States nothing like this has ever happened before.  Pt not on blood thinners. Hx of hypertension.

## 2018-12-17 NOTE — ED Provider Notes (Signed)
Vaginal Bleeding   This is a new problem. The current episode started 1 to 2 hours ago. The problem occurs rarely. The problem has been rapidly improving. Associated symptoms include abdominal pain (cramping). Pertinent negatives include no chest pain, no headaches and no shortness of breath. Associated symptoms comments: Mild abd discomfort described as cramping. + diarrhea associated with recent abx and probiotic use.. Nothing aggravates the symptoms. Nothing relieves the symptoms. She has tried nothing for the symptoms. The treatment provided significant relief.        Past Medical History:   Diagnosis Date   ??? Essential hypertension    ??? GERD (gastroesophageal reflux disease)    ??? Hematuria 01/2010    Dr. York Cerise, due to catheter, blood clot. [place] nl 2012   ??? 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  Dr. Stacie Acres   ??? Mixed hyperlipidemia    ??? Osteopenia 01/2016   ??? Spider varicose veins    ??? Vitamin D deficiency 02/2012       Past Surgical History:   Procedure Laterality Date   ??? COLONOSCOPY  01/2010    normal   ??? HX KNEE ARTHROSCOPY      x4   ??? HX KNEE REPLACEMENT      left knee replacement, Dr. Stacie Acres         Family History:   Problem Relation Age of Onset   ??? Hypertension Mother    ??? Kidney Disease Mother    ??? Heart Disease Mother         afib   ??? Hypertension Brother    ??? Heart Disease Brother    ??? Lung Disease Father         emphysema   ??? Lung Disease Brother    ??? Diabetes Neg Hx    ??? Cancer Neg Hx        Social History     Socioeconomic History   ??? Marital status: DIVORCED     Spouse name: none   ??? Number of children: 2   ??? Years of education: Not on file   ??? Highest education level: Not on file   Occupational History   ??? Occupation: daughter [name]     Employer: RETIRED   Social Needs   ??? Financial resource strain: Not on file   ??? Food insecurity     Worry: Not on file     Inability: Not on file   ??? Transportation needs     Medical: Not on file     Non-medical: Not on file   Tobacco Use   ??? Smoking status: Never Smoker    ??? Smokeless tobacco: Never Used   Substance and Sexual Activity   ??? Alcohol use: No   ??? Drug use: No   ??? Sexual activity: Never   Lifestyle   ??? Physical activity     Days per week: Not on file     Minutes per session: Not on file   ??? Stress: Not on file   Relationships   ??? Social Product manager on phone: Not on file     Gets together: Not on file     Attends religious service: Not on file     Active member of club or organization: Not on file     Attends meetings of clubs or organizations: Not on file     Relationship status: Not on file   ??? Intimate partner violence  Fear of current or ex partner: Not on file     Emotionally abused: Not on file     Physically abused: Not on file     Forced sexual activity: Not on file   Other Topics Concern   ??? Not on file   Social History Narrative   ??? Not on file         ALLERGIES: Codeine; Lisinopril; and Tylenol-codeine #2    Review of Systems   Constitutional: Negative for fever.   HENT: Positive for congestion and sinus pain (allergies).    Respiratory: Negative for cough and shortness of breath.    Cardiovascular: Negative for chest pain.   Gastrointestinal: Positive for abdominal pain (cramping), diarrhea and nausea. Negative for anal bleeding, blood in stool and vomiting.   Genitourinary: Positive for vaginal bleeding. Negative for hematuria.   Musculoskeletal: Negative for back pain.   Neurological: Negative for headaches.   All other systems reviewed and are negative.      Vitals:    12/17/18 1124 12/17/18 1130 12/17/18 1145   BP: 150/52 132/57 129/61   Pulse: 89 90 88   Resp: 18 16 18    Temp: 98.5 ??F (36.9 ??C)     SpO2: 97% 97% 99%   Weight: 89.4 kg (197 lb)     Height: 5\' 4"  (1.626 m)              Physical Exam  Vitals signs reviewed.   Constitutional:       General: She is not in acute distress.     Appearance: She is well-developed.   HENT:      Head: Normocephalic and atraumatic.   Eyes:      Conjunctiva/sclera: Conjunctivae normal.   Neck:       Musculoskeletal: Normal range of motion and neck supple.   Cardiovascular:      Rate and Rhythm: Normal rate and regular rhythm.   Pulmonary:      Effort: Pulmonary effort is normal. No respiratory distress.   Abdominal:      Palpations: Abdomen is soft.      Tenderness: There is no abdominal tenderness. There is no guarding.   Genitourinary:     Comments: Deferred.  Musculoskeletal:         General: No tenderness.      Right lower leg: No edema.      Left lower leg: No edema.   Skin:     General: Skin is warm and dry.   Neurological:      Mental Status: She is alert and oriented to person, place, and time.      Motor: No abnormal muscle tone.          MDM     Assessment and plan: This is a 78 year old female who arrived by EMS with a complaint of an episode of vaginal bleeding at home.  Patient adamant that source is vaginal not lower GI bleeding.  Differential diagnosis of postmenopausal vaginal bleeding includes vaginitis, atrophy, polyp, cancer, fibroid.  Plan to obtain labs and pelvic ultrasound to narrow the differential.  Will likely need referral to gynecology for further outpatient management as [facility] stabilized today.  Procedures

## 2018-12-17 NOTE — ED Notes (Signed)
Patient discharged by provider.  Discharge paperwork reviewed and patient denies questions.  Leaves ambulatory and in no apparent distress

## 2018-12-17 NOTE — ED Notes (Signed)
Pt to US at this time.

## 2018-12-17 NOTE — ED Notes (Signed)
 Pt arrives ambulatory via EMS with complaints of vaginal bleeding that started around 10am.  Pt states she was sitting down for breakfast and felt like she wet herself.  Says when she went to the bathroom she had wet through her underwear and pants with a large amount of blood (1-2cups.  At the time she felt lightheaded, but feels better now and is only lightly spotting. Pt states she saw her doctor for a sinus infection on Tuesday and started an antibiotic, which gave her diarrhea so she stopped taking it after 3 days.  Denies any NV or pain.  States nothing like this has ever happened before.  Pt not on blood thinners. Hx of hypertension.

## 2018-12-17 NOTE — ED Provider Notes (Signed)
Vaginal Bleeding   This is a new problem. The current episode started 1 to 2 hours ago. The problem occurs rarely. The problem has been rapidly improving. Associated symptoms include abdominal pain (cramping). Pertinent negatives include no chest pain, no headaches and no shortness of breath. Associated symptoms comments: Mild abd discomfort described as cramping. + diarrhea associated with recent abx and probiotic use.. Nothing aggravates the symptoms. Nothing relieves the symptoms. She has tried nothing for the symptoms. The treatment provided significant relief.        Past Medical History:   Diagnosis Date   ??? Essential hypertension    ??? GERD (gastroesophageal reflux disease)    ??? Hematuria 01/2010    Dr. York Cerise, due to catheter, blood clot. [place] nl 2012   ??? 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  Dr. Stacie Acres   ??? Mixed hyperlipidemia    ??? Osteopenia 01/2016   ??? Spider varicose veins    ??? Vitamin D deficiency 02/2012       Past Surgical History:   Procedure Laterality Date   ??? COLONOSCOPY  01/2010    normal   ??? HX KNEE ARTHROSCOPY      x4   ??? HX KNEE REPLACEMENT      left knee replacement, Dr. Stacie Acres         Family History:   Problem Relation Age of Onset   ??? Hypertension Mother    ??? Kidney Disease Mother    ??? Heart Disease Mother         afib   ??? Hypertension Brother    ??? Heart Disease Brother    ??? Lung Disease Father         emphysema   ??? Lung Disease Brother    ??? Diabetes Neg Hx    ??? Cancer Neg Hx        Social History     Socioeconomic History   ??? Marital status: DIVORCED     Spouse name: none   ??? Number of children: 2   ??? Years of education: Not on file   ??? Highest education level: Not on file   Occupational History   ??? Occupation: daughter [name]     Employer: RETIRED   Social Needs   ??? Financial resource strain: Not on file   ??? Food insecurity     Worry: Not on file     Inability: Not on file   ??? Transportation needs     Medical: Not on file     Non-medical: Not on file   Tobacco Use   ??? Smoking status: Never Smoker   ???  Smokeless tobacco: Never Used   Substance and Sexual Activity   ??? Alcohol use: No   ??? Drug use: No   ??? Sexual activity: Never   Lifestyle   ??? Physical activity     Days per week: Not on file     Minutes per session: Not on file   ??? Stress: Not on file   Relationships   ??? Social Product manager on phone: Not on file     Gets together: Not on file     Attends religious service: Not on file     Active member of club or organization: Not on file     Attends meetings of clubs or organizations: Not on file     Relationship status: Not on file   ??? Intimate partner violence  Fear of current or ex partner: Not on file     Emotionally abused: Not on file     Physically abused: Not on file     Forced sexual activity: Not on file   Other Topics Concern   ??? Not on file   Social History Narrative   ??? Not on file         ALLERGIES: Codeine; Lisinopril; and Tylenol-codeine #2    Review of Systems   Constitutional: Negative for fever.   HENT: Positive for congestion and sinus pain (allergies).    Respiratory: Negative for cough and shortness of breath.    Cardiovascular: Negative for chest pain.   Gastrointestinal: Positive for abdominal pain (cramping), diarrhea and nausea. Negative for anal bleeding, blood in stool and vomiting.   Genitourinary: Positive for vaginal bleeding. Negative for hematuria.   Musculoskeletal: Negative for back pain.   Neurological: Negative for headaches.   All other systems reviewed and are negative.      Vitals:    12/17/18 1124 12/17/18 1130 12/17/18 1145   BP: 150/52 132/57 129/61   Pulse: 89 90 88   Resp: 18 16 18    Temp: 98.5 ??F (36.9 ??C)     SpO2: 97% 97% 99%   Weight: 89.4 kg (197 lb)     Height: 5\' 4"  (1.626 m)              Physical Exam  Vitals signs reviewed.   Constitutional:       General: She is not in acute distress.     Appearance: She is well-developed.   HENT:      Head: Normocephalic and atraumatic.   Eyes:      Conjunctiva/sclera: Conjunctivae normal.   Neck:       Musculoskeletal: Normal range of motion and neck supple.   Cardiovascular:      Rate and Rhythm: Normal rate and regular rhythm.   Pulmonary:      Effort: Pulmonary effort is normal. No respiratory distress.   Abdominal:      Palpations: Abdomen is soft.      Tenderness: There is no abdominal tenderness. There is no guarding.   Genitourinary:     Comments: Deferred.  Musculoskeletal:         General: No tenderness.      Right lower leg: No edema.      Left lower leg: No edema.   Skin:     General: Skin is warm and dry.   Neurological:      Mental Status: She is alert and oriented to person, place, and time.      Motor: No abnormal muscle tone.          MDM     Assessment and plan: This is a 78 year old female who arrived by EMS with a complaint of an episode of vaginal bleeding at home.  Patient adamant that source is vaginal not lower GI bleeding.  Differential diagnosis of postmenopausal vaginal bleeding includes vaginitis, atrophy, polyp, cancer, fibroid.  Plan to obtain labs and pelvic ultrasound to narrow the differential.  Will likely need referral to gynecology for further outpatient management as [facility] stabilized today.  Procedures

## 2018-12-19 NOTE — Telephone Encounter (Signed)
Advised patient to be here 12/25/16 for 8:50 visit, arrival a few minutes early.  Advised to take Aleve since she takes a normal basis anyway to help with discomfort in case needs Ebx.

## 2018-12-19 NOTE — Telephone Encounter (Signed)
Ok, I can do it if 20 min visit.    trp

## 2018-12-19 NOTE — Telephone Encounter (Signed)
Pt needs TP ?new pt fu and endo bx, already had [place]    She wants to wait until 6/24 or that is next opening?  I can work her in sooner if she desires.  No anemia, if bleeding not heavy, can be seen 6/24 if that is what she prefers.    No obstetric history on file.      WBC   Date Value Ref Range Status   12/17/2018 8.4 3.6 - 11.0 K/uL Final     RBC   Date Value Ref Range Status   12/17/2018 4.86 3.80 - 5.20 M/uL Final     HGB   Date Value Ref Range Status   12/17/2018 14.0 11.5 - 16.0 g/dL Final     HCT   Date Value Ref Range Status   12/17/2018 42.8 35.0 - 47.0 % Final     PLATELET   Date Value Ref Range Status   12/17/2018 245 150 - 400 K/uL Final       trp

## 2018-12-19 NOTE — Telephone Encounter (Signed)
Please verify that this scheduling is correct for you, before I call patient to change appointment.  I set her up for Monday 12/26/18 at 8:50 am for new patient appointment 20 minutes with poss ebx since [place] not required.  I just wanted to verify that this gives you enough time and this is the soonest available.

## 2018-12-19 NOTE — Telephone Encounter (Signed)
Patient will be new to TP, scheduled for 12/31/18 for visit for post menopausal vaginal bleeding.    She said that she started with bright red vaginal bleeding on Friday and went to [facility] ER.  She was referred to call us for appointment for follow up.  Friday night , Saturday and Sunday she did not have any more bleeding.  Today, she went to urinate and wiped with bright red blood again.  No clotting.   No pain.      She wants to know if 01/04/19 is a sufficient time frame for appointment to see you.  This is only for appointment, no ultrasound or biopsy appt.     Please advise what you would like.

## 2018-12-26 ENCOUNTER — Ambulatory Visit
Admit: 2018-12-26 | Discharge: 2018-12-26 | Payer: MEDICARE | Attending: Obstetrics & Gynecology | Primary: Internal Medicine

## 2018-12-26 ENCOUNTER — Ambulatory Visit: Attending: Obstetrics & Gynecology | Primary: Internal Medicine

## 2018-12-26 DIAGNOSIS — Z01419 Encounter for gynecological examination (general) (routine) without abnormal findings: Secondary | ICD-10-CM

## 2018-12-26 NOTE — Addendum Note (Signed)
Addended by: Lenn Cal on: 02/07/2019 12:17 PM     Modules accepted: Orders

## 2018-12-26 NOTE — Progress Notes (Signed)
[facility]  [email or web address]  [phone]    Greig Castilla, MD, [place]       OB/GYN Problem visit    Chief Complaint:   Chief Complaint   Patient presents with   ??? Post Menopausal Bleeding       Last or next WWE is: 05/20/2018    History of Present Illness:  This is a new problem being evaluated by this provider.      The patient is a 78 y.o. G2 P54 female who reports having heavy (bright red) vaginal bleeding for 1 day then light bleeding for 5 days. She had PMB from last Saturday 12/17/2018-12/22/2018. She went to [place] and had an ultrasound that showed:   IMPRESSION:   1. Transabdominal pelvic ultrasound revealing unremarkable uterus. Ovaries not  seen..  2. Transvaginal pelvic ultrasound revealing endometrium slightly thicker than  expected for age, without a focal endometrial mass. Ovaries.Seen.    She reports the symptoms are has improved.  Aggravating factors include being on probiotics and abx.   Alleviating factors include none.    Pt is sure bleeding was coming from vagina.  She reports no new sexual partners.   No recent SA.     She does not have other concerns.    LMP: No LMP recorded. Patient is postmenopausal.    PFSH:  Past Medical History:   Diagnosis Date   ??? Essential hypertension    ??? GERD (gastroesophageal reflux disease)    ??? Hematuria 01/2010    Dr. York Cerise, due to catheter, blood clot. [place] nl 2012   ??? 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  Dr. Stacie Acres   ??? Mixed hyperlipidemia    ??? Osteopenia 01/2016   ??? Spider varicose veins    ??? Vitamin D deficiency 02/2012     Past Surgical History:   Procedure Laterality Date   ??? COLONOSCOPY  01/2010    normal   ??? HX KNEE ARTHROSCOPY      x4   ??? HX KNEE REPLACEMENT      left knee replacement, Dr. Stacie Acres     Family History   Problem Relation Age of Onset   ??? Hypertension Mother    ??? Kidney Disease Mother    ??? Heart Disease Mother         afib   ??? Hypertension Brother    ??? Heart Disease Brother    ??? Lung Disease Father         emphysema    ??? Lung Disease Brother    ??? Diabetes Neg Hx    ??? Cancer Neg Hx      Social History     Tobacco Use   ??? Smoking status: Never Smoker   ??? Smokeless tobacco: Never Used   Substance Use Topics   ??? Alcohol use: No   ??? Drug use: No     Allergies   Allergen Reactions   ??? Codeine Nausea Only   ??? Lisinopril Other (comments)     Facial flushing   ??? Tylenol-Codeine #2 Nausea and Vomiting     Current Outpatient Medications   Medication Sig   ??? guaiFENesin ER (Mucinex) 600 mg ER tablet Take 1 Tab by mouth two (2) times a day.   ??? telmisartan (MICARDIS) 80 mg tablet Take 1 Tab by mouth daily.   ??? chlorthalidone (HYGROTEN) 25 mg tablet Take 1 Tab by mouth daily. For blood pressure   ??? naproxen sodium (ALEVE)  220 mg cap Take 1 Tab by mouth daily.   ??? Cholecalciferol, Vitamin D3, (VITAMIN D3) 2,000 unit cap capsule Take 2,000 Units by mouth daily.   ??? multivit-min-iron-FA-lutein (CENTRUM SILVER WOMEN) 8 mg iron-400 mcg-300 mcg tab Take 1 daily   ??? cefdinir (OMNICEF) 300 mg capsule Take 1 Cap by mouth two (2) times a day.     No current facility-administered medications for this visit.        Review of Systems:  History obtained from the patient  Constitutional: negative for fevers, chills and weight loss  ENT ROS: negative for - hearing change, oral lesions or visual changes  Respiratory: negative for cough, wheezing or dyspnea on exertion  Cardiovascular: negative for chest pain, irregular heart beats, exertional chest pressure/discomfort  Gastrointestinal: negative for dysphagia, nausea and vomiting  Genito-Urinary ROS:  see HPI  Inteument/breast: negative for rash, breast lump and nipple discharge  Musculoskeletal:negative for stiff joints, neck pain and muscle weakness  Endocrine ROS: negative for - breast changes, galactorrhea or temperature intolerance  Hematological and Lymphatic ROS: negative for - blood clots, bruising or swollen lymph nodes    Physical Exam:  Visit Vitals  BP 138/70   Ht 5\' 4"  (1.626 m)    Wt 197 lb (89.4 kg)   BMI 33.81 kg/m??       GENERAL: alert, well appearing, and in no distress  HEAD: normocephalic, atraumatic.   PULM: clear to auscultation, no wheezes, rales or rhonchi, symmetric air entry   COR: normal rate and regular rhythm, S1 and S2 normal   ABDOMEN: soft, nontender, nondistended, no masses or organomegaly   EGBUS: no lesions, no inflammation, no masses  VULVA: normal appearing vulva with no masses, tenderness or lesions  VAGINA: normal appearing vagina with normal color, no lesions, no discharge, atrophic changes, posterior prolapse  CERVIX: normal appearing cervix without discharge or lesions, non tender, limited visualization : see procedure note below  UTERUS: uterus is normal size, shape, consistency and nontender   ADNEXA: normal adnexa in size, nontender and no masses  NEURO: alert, oriented, normal speech    Assessment:  Encounter Diagnoses   Name Primary?   ??? Encounter for gynecological examination Yes   ??? Post-menopausal bleeding        Plan:  The patient is advised that she should contact the office if she does not note improvement or if symptoms recur  Recommend follow up with PCP for non-gynecologic complaints and chronic medical problems.    She should contact our office with any questions or concerns  She could keep her routine annual exam appointment.   Rec complete WWE/MMG asap  Discussed risks, benefits and alternatives to procedure, including not performing it.  Discussed bleeding, infection, anesthesia risks, damage to internal organs; bladder/bowel/other internal organs, scarring, additional procedures if needed.  We also discussed that even death can be a rare complication.   Disc endo bx rba: see attempt below  Disc option of SIS/repeat US but suspect this similar evaluation would also be challenging in an office setting.  Pt elects surgical eval: hystero d and c possible polypectomy  Disc importance of evaluation of PMP bleeding and discussed SIS/reattempt  endo bx, pt defers for surgical options, despite potential increase risks because of anesthesia and COVID, because a better sampling should be able to be obtained in OR.    The patient was counseled at length about the risks of contracting Covid-19 during their perioperative period and any recovery window from their procedure.  The patient was made aware that contracting Covid-19  may worsen their prognosis for recovering from their procedure and lend to a higher morbidity and/or mortality risk.  All material risks, benefits, and reasonable alternatives including postponing the procedure were discussed. The patient does  wish to proceed with the procedure at this time.          Orders Placed This Encounter   ??? PAP IG, APTIMA HPV AND RFX 16/18,45 ([id number])       No results found for this visit on 12/26/18.    [facility]  OFFICE PROCEDURE PROGRESS NOTE           Chart reviewed for the following:  I, Jed Limerick, have reviewed the History, Physical and updated the Allergic reactions for Valerie Barry performed immediately prior to start of procedure:  I, Jed Limerick, have performed the following reviews on Valerie Barry prior to the start of the procedure:      * Patient was identified by name and date of birth   * Agreement on procedure being performed was verified  * Risks and Benefits explained to the patient  * Procedure site verified and marked as necessary  * Patient was positioned for comfort  * Consent was signed and verified      Time: 9:10am        Date of procedure: 12/26/2018     Procedure performed by:  Sallye Ober, MD     Provider assisted by: Dennis Bast, MA     Patient assisted by: self     How tolerated by patient: tolerated the procedure well with no complications     Post Procedural Pain Scale: 0 - No Hurt     Comments: none    Procedure note: Endometrial biopsy, unsuccessful    Valerie Barry is a G2 P6,  78 y.o. female [place] No LMP  recorded. Patient is postmenopausal.  The patient has a history of The primary encounter diagnosis was Encounter for gynecological examination. A diagnosis of Post-menopausal bleeding was also pertinent to this visit. and presents for an endometrial biopsy.   Indications:   After the indications, risks, benefits, and alternatives to performing an endometrial biopsy were explained to the patient, her questions were answered and informed consent was obtained.   Procedure:  The patient was placed on the table in the dorsal lithotomy position. A bimanual exam showed the uterus to be midposition. The uterus was not obviously enlarged.  A speculum was placed in the vagina. The cervix was visualized with some difficulty.  A pap smear was obtained, but it was difficult to get patient in a comfortable position to complete the endometrial biopsy because of posterior vaginal prolapse, limited flexibility and anterior cervix.  Pt was cooperative through attempt.    The endometrial biopsy attempt was discontinued.   Post Procedural Status:  The patient was observed for 65min after procedure  The patient was discharged in stable condition.

## 2018-12-26 NOTE — Progress Notes (Signed)
Progress Notes by Sallye Ober, MD at 12/26/18 [phone]                Author: Sallye Ober, MD  Service: --  Author Type: Physician       Filed: 12/26/18 1332  Encounter Date: 12/26/2018  Status: Signed          Editor: Sallye Ober, MD (Physician)                      [facility]   [email or web address]   [phone]      Greig Castilla, MD, [place]          OB/GYN Problem visit      Chief Complaint:      Chief Complaint       Patient presents with        ?  Post Menopausal Bleeding           Last or next WWE is: 05/20/2018      History of Present Illness:   This is a new problem being evaluated by this provider.        The patient is a 78 y.o. G2 P75 female who reports having heavy (bright red) vaginal bleeding for 1 day then light bleeding for 5 days. She  had PMB from last Saturday 12/17/2018-12/22/2018. She went to [place] and had an ultrasound that showed:    IMPRESSION:    1. Transabdominal pelvic ultrasound revealing unremarkable uterus. Ovaries not   seen..   2. Transvaginal pelvic ultrasound revealing endometrium slightly thicker than   expected for age, without a focal endometrial mass. Ovaries.Seen.      She reports the symptoms are has improved.   Aggravating factors include being on probiotics and abx.    Alleviating factors include none.      Pt is sure bleeding was coming from vagina.   She reports no new sexual partners.    No recent SA.       She does not have other concerns.      LMP: No LMP recorded. Patient is postmenopausal.      PFSH:     Past Medical History:        Diagnosis  Date         ?  Essential hypertension       ?  GERD (gastroesophageal reflux disease)       ?  Hematuria  01/2010          Dr. York Cerise, due to catheter, blood clot. [place] nl 2012         ?  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  Dr. Stacie Acres         ?  Mixed hyperlipidemia       ?  Osteopenia  01/2016     ?  Spider varicose veins           ?  Vitamin D deficiency  02/2012          Past Surgical  History:         Procedure  Laterality  Date          ?  COLONOSCOPY    01/2010          normal          ?  HX KNEE ARTHROSCOPY              x4          ?  HX KNEE REPLACEMENT              left knee replacement, Dr. Stacie Acres          Family History         Problem  Relation  Age of Onset          ?  Hypertension  Mother       ?  Kidney Disease  Mother       ?  Heart Disease  Mother                afib          ?  Hypertension  Brother       ?  Heart Disease  Brother       ?  Lung Disease  Father                emphysema          ?  Lung Disease  Brother       ?  Diabetes  Neg Hx            ?  Cancer  Neg Hx            Social History          Tobacco Use         ?  Smoking status:  Never Smoker     ?  Smokeless tobacco:  Never Used       Substance Use Topics         ?  Alcohol use:  No         ?  Drug use:  No          Allergies        Allergen  Reactions         ?  Codeine  Nausea Only     ?  Lisinopril  Other (comments)             Facial flushing         ?  Tylenol-Codeine #2  Nausea and Vomiting          Current Outpatient Medications        Medication  Sig         ?  guaiFENesin ER (Mucinex) 600 mg ER tablet  Take 1 Tab by mouth two (2) times a day.     ?  telmisartan (MICARDIS) 80 mg tablet  Take 1 Tab by mouth daily.     ?  chlorthalidone (HYGROTEN) 25 mg tablet  Take 1 Tab by mouth daily. For blood pressure     ?  naproxen sodium (ALEVE) 220 mg cap  Take 1 Tab by mouth daily.     ?  Cholecalciferol, Vitamin D3, (VITAMIN D3) 2,000 unit cap capsule  Take 2,000 Units by mouth daily.     ?  multivit-min-iron-FA-lutein (CENTRUM SILVER WOMEN) 8 mg iron-400 mcg-300 mcg tab  Take 1 daily         ?  cefdinir (OMNICEF) 300 mg capsule  Take 1 Cap by mouth two (2) times a day.          No current facility-administered medications for this visit.            Review of Systems:   History obtained from the patient   Constitutional: negative for fevers, chills and weight loss   ENT ROS: negative for - hearing change, oral lesions  or visual changes  Respiratory: negative for cough, wheezing or dyspnea on exertion   Cardiovascular: negative for chest pain, irregular heart beats, exertional chest pressure/discomfort   Gastrointestinal: negative for dysphagia, nausea and vomiting   Genito-Urinary ROS:  see HPI   Inteument/breast: negative for rash, breast lump and nipple discharge   Musculoskeletal:negative for stiff joints, neck pain and muscle weakness   Endocrine ROS: negative for - breast changes, galactorrhea or temperature intolerance   Hematological and Lymphatic ROS: negative for - blood clots, bruising or swollen lymph nodes      Physical Exam:   Visit Vitals      BP  138/70     Ht  5\' 4"  (1.626 m)     Wt  197 lb (89.4 kg)        BMI  33.81 kg/m??           GENERAL: alert, well appearing, and in no distress   HEAD: normocephalic, atraumatic.    PULM: clear to auscultation, no wheezes, rales or rhonchi, symmetric air entry    COR: normal rate and regular rhythm, S1 and S2 normal    ABDOMEN: soft, nontender, nondistended, no masses or organomegaly    EGBUS: no lesions, no inflammation, no masses   VULVA: normal appearing vulva with no masses, tenderness or lesions   VAGINA: normal appearing vagina with normal color, no lesions, no discharge, atrophic changes, posterior prolapse   CERVIX: normal appearing cervix without discharge or lesions, non tender, limited visualization : see procedure note below   UTERUS: uterus is normal size, shape, consistency and nontender    ADNEXA: normal adnexa in size, nontender and no masses   NEURO: alert, oriented, normal speech      Assessment:     Encounter Diagnoses        Name  Primary?         ?  Encounter for gynecological examination  Yes         ?  Post-menopausal bleeding             Plan:   The patient is advised that she should contact the office if she does not note improvement or if symptoms recur   Recommend follow up with PCP for non-gynecologic complaints and chronic medical problems.      She should contact our office with any questions or concerns   She could keep her routine annual exam appointment.    Rec complete WWE/MMG asap   Discussed risks, benefits and alternatives to procedure, including not performing it.   Discussed bleeding, infection, anesthesia risks, damage to internal organs; bladder/bowel/other internal organs, scarring, additional procedures if needed.   We also discussed that even death can be a rare complication.    Disc endo bx rba: see attempt below   Disc option of SIS/repeat US but suspect this similar evaluation would also be challenging in an office setting.   Pt elects surgical eval: hystero d and c possible polypectomy   Disc importance of evaluation of PMP bleeding and discussed SIS/reattempt endo bx, pt defers for surgical options, despite potential increase risks because of anesthesia and COVID, because a better sampling should be able to be obtained in OR.      The patient was counseled at length about the risks of contracting Covid-19 during their perioperative period and any recovery window from their procedure.  The patient was  made aware that contracting Covid-19  may worsen their prognosis for recovering from their procedure and lend to a higher  morbidity and/or mortality risk.  All material risks, benefits, and reasonable alternatives including postponing the procedure were  discussed. The patient does  wish to proceed with the procedure at this time.                 Orders Placed This Encounter        ?  PAP IG, APTIMA HPV AND RFX 16/18,45 ([id number])           No results found for this visit on 12/26/18.      [facility]   OFFICE PROCEDURE PROGRESS NOTE               Chart reviewed for the following:   I, Jed Limerick, have reviewed the History, Physical and updated the Allergic reactions for [place] performed immediately prior to start of procedure:   I, Jed Limerick, have performed the following reviews on SUNDAY KLOS prior to the  start of the procedure:        * Patient was identified by name and date of birth    * Agreement on procedure being performed was verified   * Risks and Benefits explained to the patient   * Procedure site verified and marked as necessary   * Patient was positioned for comfort   * Consent was signed and verified        Time: 9:10am           Date of procedure: 12/26/2018       Procedure performed by:  Sallye Ober, MD       Provider assisted by: Dennis Bast, MA       Patient assisted by: self       How tolerated by patient: tolerated the procedure well with no complications       Post Procedural Pain Scale: 0 - No Hurt       Comments: none      Procedure note: Endometrial biopsy, unsuccessful      DANIELL PARADISE is a G2 P58,  78 y.o.  female [place]  No LMP recorded. Patient is postmenopausal.   The patient has a history of The primary encounter diagnosis was Encounter for gynecological examination. A diagnosis of Post-menopausal bleeding was also pertinent to this visit.  and presents for an endometrial biopsy.    Indications:    After the indications, risks, benefits, and alternatives to performing an endometrial biopsy were explained to the patient, her questions were answered and informed consent was obtained.    Procedure:   The patient was placed on the table in the dorsal lithotomy position. A bimanual exam showed the uterus to be midposition. The uterus was not obviously enlarged.   A speculum was placed in the vagina. The cervix was visualized with some difficulty.   A pap smear was obtained, but it was difficult to get patient in a comfortable position to complete the endometrial biopsy because of posterior vaginal prolapse, limited flexibility and anterior cervix.  Pt was cooperative through attempt.     The endometrial biopsy attempt was discontinued.    Post Procedural Status:   The patient was observed for 1min after procedure   The patient was discharged in stable  condition.

## 2018-12-26 NOTE — Progress Notes (Signed)
Normal pap smear  Update per TP protocol.  Update PMH/HM: include:  Date of pap, Cytology: wnl.  For HR HPV results: list NEG or POS, when done.

## 2018-12-26 NOTE — Progress Notes (Signed)
Updated PMH.

## 2018-12-26 NOTE — Addendum Note (Signed)
Addendum  Note by Lenn Cal at 12/26/18 0850                Author: Lenn Cal  Service: --  Author Type: Medical Assistant       Filed: 02/07/19 1217  Encounter Date: 12/26/2018  Status: Signed          Editor: Lenn Cal (Medical Assistant)          Addended by: Lenn Cal on: 02/07/2019 12:17 PM    Modules accepted: Orders

## 2018-12-26 NOTE — Patient Instructions (Signed)
Abnormal Uterine Bleeding: Care Instructions  Your Care Instructions     Abnormal uterine bleeding is irregular bleeding from the uterus that is longer or heavier than usual or does not occur at your regular time. Sometimes it is caused by changes in hormone levels. It can also be caused by growths in the uterus, such as fibroids or polyps. Sometimes a cause cannot be found.  You may have heavy bleeding when you are not expecting your period. Your doctor may suggest a pregnancy test, if you think you are pregnant.  Follow-up care is a key part of your treatment and safety. Be sure to make and go to all appointments, and call your doctor if you are having problems. It's also a good idea to know your test results and keep a list of the medicines you take.  How can you care for yourself at home?  ?? Be safe with medicines. Take pain medicines exactly as directed.  ? If the doctor gave you a prescription medicine for pain, take it as prescribed.  ? If you are not taking a prescription pain medicine, ask your doctor if you can take an over-the-counter medicine.  ?? You may be low in iron because of blood loss. Eat a balanced diet that is high in iron and vitamin C. Foods rich in iron include red meat, shellfish, eggs, beans, and leafy green vegetables. Talk to your doctor about whether you need to take iron pills or a multivitamin.  When should you call for help?   [id number] anytime you think you may need emergency care. For example, call if:  ?? You passed out (lost consciousness).  Call your doctor now or seek immediate medical care if:  ?? You have new or worse belly or pelvic pain.  ?? You have severe vaginal bleeding.  ?? You feel dizzy or lightheaded, or you feel like you may faint.  Watch closely for changes in your health, and be sure to contact your doctor if:  ?? You think you may be pregnant.  ?? Your bleeding gets worse.  ?? You do not get better as expected.  Where can you learn more?   Go to [employer]  Enter I327 in the search box to learn more about "Abnormal Uterine Bleeding: Care Instructions."  Current as of: May 20, 2018??????????????????????????????Content Version: 12.5  ?? 2006-2020 [employer].   Care instructions adapted under license by [employer] (which disclaims liability or warranty for this information). If you have questions about a medical condition or this instruction, always ask your healthcare professional. [place] any warranty or liability for your use of this information.

## 2019-01-01 LAB — PAP IG, APTIMA HPV AND RFX 16/18,45 (199305)
HPV Aptima: NEGATIVE
LABCORP 019018: 0

## 2019-01-01 LAB — PAP IG, APTIMA HPV AND RFX 16/18,45 (507805)
.: 0
HPV APTIMA: NEGATIVE

## 2019-01-02 MED ORDER — MEGESTROL 20 MG TAB
20 mg | ORAL_TABLET | Freq: Every day | ORAL | 1 refills | Status: DC
Start: 2019-01-02 — End: 2019-07-12

## 2019-01-02 NOTE — Telephone Encounter (Signed)
01/02/19  2:09 pm- spoke with patient and advised that TP is aware of the message and she wants to call her directly.  She will be calling, please expect a call directly from her.

## 2019-01-02 NOTE — Telephone Encounter (Signed)
01/02/19 -2:00pm calling back for excrutiating pain in back and stomach.  She is asking for pain medication.  It makes her sick on her stomach.  Heating pad seemed to help the discomfort yesterday but she did not have the bleeding then.  The bleeding is doing a lot better, significantly lighter.

## 2019-01-02 NOTE — Telephone Encounter (Signed)
Patient of TP    78 years old    Calling to say that even after TP exam she had no signs of vaginal bleeding.  She said that she went for about 1 1/2 weeks without bleeding.  She said that she started with vaginal bleeding last night and it was "fairly heavy" (never soaking a regular absorbancy pad".  She had bad abdominal cramping like she had when she had labor and lower back pain.  She is upset and wants to know should she expect this until she has surgery with you on 02/08/19 (hysteroscopy, D&C, possible polypectomy).  She said she started feeling "hot last night" but not running a fever.      Please advise.

## 2019-01-02 NOTE — Telephone Encounter (Signed)
Spoke w pt: given pain and bleeding prec.  Will try to move up surgery (1 week earlier spot available, will check for cancellations)  Disc adding progesterone/megace to help with bleeding: pt amenable.  Disc rba of adding progestin/megace and notify MD if any side effects/not tolerated.     We discussed that the pain may not be related to bleeding and reviewed pain prec and safer NSAID dosing (and to avoid prior to surgery) and tyl dosing.    Notify MD if NI.  Rx sent  Tonya/surgery scheduler will contact pt about changing date.

## 2019-01-03 ENCOUNTER — Encounter

## 2019-01-04 ENCOUNTER — Encounter: Attending: Obstetrics & Gynecology | Primary: Internal Medicine

## 2019-01-25 ENCOUNTER — Inpatient Hospital Stay: Admit: 2019-01-25 | Payer: MEDICARE | Primary: Internal Medicine

## 2019-01-25 DIAGNOSIS — Z01810 Encounter for preprocedural cardiovascular examination: Secondary | ICD-10-CM

## 2019-01-25 LAB — BASIC METABOLIC PANEL
Anion Gap: 4 mmol/L — ABNORMAL LOW (ref 5–15)
BUN: 17 MG/DL (ref 6–20)
Bun/Cre Ratio: 18 (ref 12–20)
CO2: 28 mmol/L (ref 21–32)
Calcium: 9.6 MG/DL (ref 8.5–10.1)
Chloride: 106 mmol/L (ref 97–108)
Creatinine: 0.93 MG/DL (ref 0.55–1.02)
EGFR IF NonAfrican American: 58 mL/min/{1.73_m2} — ABNORMAL LOW (ref >60)
GFR African American: >60 mL/min/{1.73_m2} (ref >60)
Glucose: 101 mg/dL — ABNORMAL HIGH (ref 65–100)
Potassium: 3.9 mmol/L (ref 3.5–5.1)
Sodium: 138 mmol/L (ref 136–145)

## 2019-01-25 LAB — CBC
Hematocrit: 42.5 % (ref 35.0–47.0)
Hemoglobin: 14 g/dL (ref 11.5–16.0)
MCH: 28.7 PG (ref 26.0–34.0)
MCHC: 32.9 g/dL (ref 30.0–36.5)
MCV: 87.3 FL (ref 80.0–99.0)
MPV: 11.3 FL (ref 8.9–12.9)
NRBC Absolute: 0 10*3/uL (ref 0.00–0.01)
Nucleated RBCs: 0 PER 100 WBC
Platelets: 256 10*3/uL (ref 150–400)
RBC: 4.87 M/uL (ref 3.80–5.20)
RDW: 12.7 % (ref 11.5–14.5)
WBC: 8.8 10*3/uL (ref 3.6–11.0)

## 2019-01-25 LAB — URINALYSIS W/ REFLEX CULTURE
BACTERIA, URINE: NEGATIVE /hpf
Bacteria: NEGATIVE /hpf
Bilirubin, Urine: NEGATIVE
Bilirubin: NEGATIVE
Glucose, Ur: NEGATIVE mg/dL
Glucose: NEGATIVE mg/dL
Nitrite, Urine: NEGATIVE
Nitrites: NEGATIVE
RBC, UA: >100 /hpf — ABNORMAL HIGH (ref 0–5)
RBC: >100 /hpf — ABNORMAL HIGH (ref 0–5)
Specific Gravity, UA: 1.021 (ref 1.003–1.030)
Specific gravity: 1.021 (ref 1.003–1.030)
Urobilinogen, UA, POCT: 0.2 EU/dL (ref 0.2–1.0)
Urobilinogen: 0.2 EU/dL (ref 0.2–1.0)
pH (UA): 6 (ref 5.0–8.0)
pH, UA: 6 (ref 5.0–8.0)

## 2019-01-25 LAB — EKG 12-LEAD
Atrial Rate: 93 {beats}/min
P Axis: 59 degrees
P-R Interval: 170 ms
Q-T Interval: 336 ms
QRS Duration: 72 ms
QTc Calculation (Bazett): 417 ms
R Axis: 30 degrees
T Axis: 8 degrees
Ventricular Rate: 93 {beats}/min

## 2019-01-25 LAB — CBC W/O DIFF
ABSOLUTE NRBC: 0 10*3/uL (ref 0.00–0.01)
HCT: 42.5 % (ref 35.0–47.0)
HGB: 14 g/dL (ref 11.5–16.0)
MCH: 28.7 PG (ref 26.0–34.0)
MCHC: 32.9 g/dL (ref 30.0–36.5)
MCV: 87.3 FL (ref 80.0–99.0)
MPV: 11.3 FL (ref 8.9–12.9)
NRBC: 0 PER 100 WBC
PLATELET: 256 10*3/uL (ref 150–400)
RBC: 4.87 M/uL (ref 3.80–5.20)
RDW: 12.7 % (ref 11.5–14.5)
WBC: 8.8 10*3/uL (ref 3.6–11.0)

## 2019-01-25 LAB — METABOLIC PANEL, BASIC
Anion gap: 4 mmol/L — ABNORMAL LOW (ref 5–15)
BUN/Creatinine ratio: 18 (ref 12–20)
BUN: 17 MG/DL (ref 6–20)
CO2: 28 mmol/L (ref 21–32)
Calcium: 9.6 MG/DL (ref 8.5–10.1)
Chloride: 106 mmol/L (ref 97–108)
Creatinine: 0.93 MG/DL (ref 0.55–1.02)
GFR est AA: >60 mL/min/{1.73_m2} (ref >60)
GFR est non-AA: 58 mL/min/{1.73_m2} — ABNORMAL LOW (ref >60)
Glucose: 101 mg/dL — ABNORMAL HIGH (ref 65–100)
Potassium: 3.9 mmol/L (ref 3.5–5.1)
Sodium: 138 mmol/L (ref 136–145)

## 2019-01-25 LAB — EKG, 12 LEAD, INITIAL
Atrial Rate: 93 {beats}/min
Calculated P Axis: 59 degrees
Calculated R Axis: 30 degrees
Calculated T Axis: 8 degrees
P-R Interval: 170 ms
Q-T Interval: 336 ms
QRS Duration: 72 ms
QTC Calculation (Bezet): 417 ms
Ventricular Rate: 93 {beats}/min

## 2019-01-25 NOTE — Interval H&P Note (Signed)
 [facility]                   [address] [place], [place] [address]   MAIN OR                                  [phone]   MAIN PRE OP                          [phone]                                                                                AMBULATORY PRE OP          [phone]  PRE-ADMISSION TESTING    [phone]   Surgery Date:  02/01/19       Is surgery arrival time given by surgeon?   NO  If "NO", [facility] staff will call you between 3 and 7pm the day before your surgery with your arrival time. (If your surgery is on a Monday, we will call you the Friday before.)    Call [phone] after 7pm Monday-Friday if you did not receive this call.    INSTRUCTIONS BEFORE YOUR SURGERY   When You  Arrive Arrive at the 2nd Floor Admitting Desk on the day of your surgery  Have your insurance card, photo ID, and any copayment (if needed)   Food   and   Drink NO food or drink after midnight the night before surgery    This means NO water, gum, mints, coffee, juice, etc.  No alcohol (beer, wine, liquor) 24 hours before and after surgery   Medications to   TAKE   Morning of Surgery MEDICATIONS TO TAKE THE MORNING OF SURGERY WITH A SIP OF WATER:   . none   Medications  To  STOP      7 days before surgery . Non-Steroidal anti-inflammatory Drugs (NSAID's): for example, Ibuprofen (Advil, Motrin), Naproxen (Aleve)  . Aspirin, if taking for pain   . Herbal supplements, vitamins, and fish oil  . Other:  (Pain medications not listed above, including Tylenol may be taken)   Blood  Thinners . If you take  Aspirin, Plavix, Coumadin, or any blood-thinning or anti-blood clot medicine, talk to the doctor who prescribed the medications for pre-operative instructions.   Bathing Clothing  Jewelry  Valuables     . If you shower the morning of surgery, please do not apply anything to your skin (lotions, powders, deodorant, or makeup, especially mascara)  . Follow Chlorhexidine Care Fusion  body wash instructions provided to you during PAT appointment. Begin 3 days prior to surgery.  . Do not shave or trim anywhere 24 hours before surgery  . Wear your hair loose or down; no pony-tails, buns, or metal hair clips  . Wear loose, comfortable, clean clothes  . Wear glasses instead of contacts  . Leave money, valuables, and jewelry, including body piercings, at home   Going Home - or Spending the Night . SAME-DAY SURGERY: You must have  a responsible adult drive you home and stay with you 24 hours after surgery  . ADMITS: If your doctor is keeping you in the hospital after surgery, leave personal belongings/luggage in your car until you have a hospital room number.    Hospital discharge time is 12 noon  Drivers must be here before 12 noon unless you are told differently   Special Instructions It is now mandated that all surgical patients be tested for COVID-19 prior to surgery.   Testing has to be exactly 4 days prior to surgery.       Your COVID test date is 01/28/19 between 8:00 am and 11:00 am.       COVID testing will be performed curbside at the [facility] entrance.   There will be signs leading you to the testing site. You will need to bring a photo ID with you to be swabbed.       Patients are advised to self-quarantine at home after testing and prior to your surgery date.   You will be notified if your results are positive.    What to watch for:  . Coronavirus (COVID-19) affects different people in different ways  . It also appears with a wide range of symptoms from mild to severe  . Signs usually appear 2-14 days after exposure    . If you develop any of the following, notify your doctor immediately:  o Fever  o Chills, with or without a shiver  o Muscle pain  o Headache  o Sore throat  o Dry cough  o New loss of taste or smell  o Tiredness    .  If you develop any of the following, call 911:  o Shortness of breath  o Difficulty breathing  o Chest pain  o New confusion  o Blueness of  fingers and/or lips     Follow all instructions so your surgery won't be cancelled.  Please, be on time.                    If a situation occurs and you are delayed the day of surgery, call [phone]     If your physical condition changes (like a fever, cold, flu, etc.) call your surgeon.    Home medication(s) reviewed and verified via      LIST      during PAT appointment.    The patient was contacted by     IN-PERSON  The patient verbalizes understanding of all instructions and      DOES NOT   need reinforcement.

## 2019-01-25 NOTE — Other (Signed)
[facility]                   [place] [address]   MAIN OR                                  [phone]   MAIN PRE OP                          [phone]                                                                                AMBULATORY PRE OP          [phone]  PRE-ADMISSION TESTING    [phone]   Surgery Date:  02/01/19       Is surgery arrival time given by surgeon?   NO  If ???NO???, [facility] staff will call you between 3 and 7pm the day before your surgery with your arrival time. (If your surgery is on a Monday, we will call you the Friday before.)    Call [phone] after 7pm Monday-Friday if you did not receive this call.    INSTRUCTIONS BEFORE YOUR SURGERY   When You  Arrive Arrive at the 2nd [facility] on the day of your surgery  Have your insurance card, photo ID, and any copayment (if needed)   Food   and   Drink NO food or drink after midnight the night before surgery    This means NO water, gum, mints, coffee, juice, etc.  No alcohol (beer, wine, liquor) 24 hours before and after surgery   Medications to   TAKE   Morning of Surgery MEDICATIONS TO TAKE THE MORNING OF SURGERY WITH A SIP OF WATER:   ? none   Medications  To  STOP      7 days before surgery ? Non-Steroidal anti-inflammatory Drugs (NSAID's): for example, Ibuprofen (Advil, Motrin), Naproxen (Aleve)  ? Aspirin, if taking for pain   ? Herbal supplements, vitamins, and fish oil  ? Other:  (Pain medications not listed above, including Tylenol may be taken)   Blood  Thinners ? If you take  Aspirin, Plavix, Coumadin, or any blood-thinning or anti-blood clot medicine, talk to the doctor who prescribed the medications for pre-operative instructions.   Bathing Clothing  Jewelry  Valuables     ? If you shower the morning of surgery, please do not apply anything to your skin (lotions, powders, deodorant, or makeup, especially mascara)   ? Follow Chlorhexidine Care Fusion body wash instructions provided to you during PAT appointment. Begin 3 days prior to surgery.  ? Do not shave or trim anywhere 24 hours before surgery  ? Wear your hair loose or down; no pony-tails, buns, or metal hair clips  ? Wear loose, comfortable, clean clothes  ? Wear glasses instead of contacts  ? Leave money, valuables, and jewelry, including body piercings, at home   Going Home - or Spending the Night ? SAME-DAY SURGERY: You must have  a responsible adult drive you home and stay with you 24 hours after surgery  ? ADMITS: If your doctor is keeping you in the hospital after surgery, leave personal belongings/luggage in your car until you have a hospital room number.    Hospital discharge time is 12 noon  Drivers must be here before 12 noon unless you are told differently   Special Instructions It is now mandated that all surgical patients be tested for COVID-19 prior to surgery.   Testing has to be exactly 4 days prior to surgery.       Your COVID test date is 01/28/19 between 8:00 am and 11:00 am.       COVID testing will be performed curbside at the [facility] entrance.   There will be signs leading you to the testing site. You will need to bring a photo ID with you to be swabbed.       Patients are advised to self-quarantine at home after testing and prior to your surgery date.   You will be notified if your results are positive.    What to watch for:  ? Coronavirus (COVID-19) affects different people in different ways  ? It also appears with a wide range of symptoms from mild to severe  ? Signs usually appear 2-14 days after exposure    ? If you develop any of the following, notify your doctor immediately:  o Fever  o Chills, with or without a shiver  o Muscle pain  o Headache  o Sore throat  o Dry cough  o New loss of taste or smell  o Tiredness    ?  If you develop any of the following, call 911:  o Shortness of breath  o Difficulty breathing   o Chest pain  o New confusion  o Blueness of fingers and/or lips     Follow all instructions so your surgery won???t be cancelled.  Please, be on time.                    If a situation occurs and you are delayed the day of surgery, call [phone] .    If your physical condition changes (like a fever, cold, flu, etc.) call your surgeon.    Home medication(s) reviewed and verified via      LIST     during PAT appointment.    The patient was contacted by     IN-PERSON  The patient verbalizes understanding of all instructions and      DOES NOT   need reinforcement.

## 2019-01-28 ENCOUNTER — Inpatient Hospital Stay: Admit: 2019-01-26 | Payer: MEDICARE | Primary: Internal Medicine

## 2019-01-28 DIAGNOSIS — Z01812 Encounter for preprocedural laboratory examination: Secondary | ICD-10-CM

## 2019-01-30 LAB — COVID-19: SARS-CoV-2: NOT DETECTED

## 2019-01-30 LAB — NOVEL CORONAVIRUS (COVID-19): SARS-CoV-2: NOT DETECTED

## 2019-01-31 ENCOUNTER — Other Ambulatory Visit: Payer: Self-pay | Admitting: Cardiology

## 2019-01-31 NOTE — H&P (Signed)
Gynecology History and Physical    Name: Valerie Barry MRN: [id number] SSN: [id number]    Date of Birth: 1941-06-17  Age: 78 y.o.  Sex: female       Subjective:      Chief complaint:  Postmenopausal bleeding    Leontyne is a 78 y.o. female with a history of postmenopausal bleeding.   Previous workup included outside Ultrasound which showed: The uterus shows overall size of 6.7 x 4.5 x 2.9 cm. The  central endometrium measures 9 mm with no obvious endometrial mass or fluid  Collection.  Pt declined SIS, office endo bx and elected surgical evaluation.  She has still had bleeding on and off, last took megace 4 days ago.  ?? .   Previous treatment measures included megace. She is admitted for Procedure(s) (LRB):  HYSTEROSCOPY, DILATATION AND CURETTAGE, POSSIBLE POLYPECTOMY WITH TRUCLEAR (N/A).    Mammogram results:  No results found for this or any previous visit.    Last pap smear:  was normal    The current method of family planning is post menopausal status.    OB History     Gravida   2    Para   2    Term   2    Preterm        AB        Living   2       SAB        TAB        Ectopic        Molar        Multiple        Live Births   2              Past Medical History:   Diagnosis Date   ??? Essential hypertension    ??? Hematuria 01/2010    Dr. York Cerise, due to catheter, blood clot. [place] nl 2012   ??? 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  Dr. Stacie Acres   ??? Mixed hyperlipidemia    ??? Nausea & vomiting     PONV   ??? Osteopenia 01/2016   ??? Pap smear for cervical cancer screening 12/26/2018    Negative, HPV Negative   ??? Spider varicose veins    ??? Vitamin D deficiency 02/2012     Past Surgical History:   Procedure Laterality Date   ??? COLONOSCOPY  01/2010    normal   ??? HX KNEE ARTHROSCOPY      x4   ??? 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 Stacie Acres     Social History     Occupational History   ??? Occupation: daughter [name]     Employer: RETIRED   Tobacco Use   ??? Smoking status: Never Smoker   ??? Smokeless tobacco: Never Used    Substance and Sexual Activity   ??? Alcohol use: No   ??? Drug use: No   ??? Sexual activity: Not Currently     Partners: Male     Birth control/protection: None     Family History   Problem Relation Age of Onset   ??? Hypertension Mother    ??? Kidney Disease Mother    ??? Heart Disease Mother         afib   ??? Hypertension Brother    ??? Heart Disease Brother    ??? Lung Disease Father         emphysema   ???  Lung Disease Brother    ??? Diabetes Neg Hx    ??? Cancer Neg Hx      There are no active hospital problems to display for this patient.         Allergies   Allergen Reactions   ??? Codeine Nausea Only   ??? Lisinopril Other (comments)     Facial flushing   ??? Tylenol-Codeine #2 Nausea and Vomiting     Prior to Admission medications    Medication Sig Start Date End Date Taking? Authorizing Provider   megestroL (MEGACE) 20 mg tablet Take 1 Tab by mouth daily. Increase to 1 tab po BID after 3 days if needed for heavy bleeding.  Indications: Postmenopausal bleeding 01/02/19  Yes Nathanyel Defenbaugh, Jonelle Sidle, MD   telmisartan (MICARDIS) 80 mg tablet Take 1 Tab by mouth daily. 10/04/18  Yes Port, Balinda Quails, MD   chlorthalidone (HYGROTEN) 25 mg tablet Take 1 Tab by mouth daily. For blood pressure 10/04/18  Yes Port, Balinda Quails, MD   Cholecalciferol, Vitamin D3, (VITAMIN D3) 2,000 unit cap capsule Take 2,000 Units by mouth daily. 06/21/15  Yes Port, Balinda Quails, MD   multivit-min-iron-FA-lutein (CENTRUM SILVER WOMEN) 8 mg iron-400 mcg-300 mcg tab Take 1 daily 07/14/14   Port, Balinda Quails, MD        Review of Systems:  A comprehensive review of systems was negative except for that written in the History of Present Illness.     Objective:     Vitals:    02/01/19 0848   BP: 155/75   Pulse: (!) 109   Resp: 23   Temp: 98.1 ??F (36.7 ??C)   SpO2: 99%   Weight: 181 lb (82.1 kg)       Physical Exam:  Patient without distress.  Heart: Regular rate and rhythm or S1S2 present  Lung: clear to auscultation throughout lung fields, no wheezes, no rales, no rhonchi and normal respiratory  effort  Abdomen: soft, nontender  Extremities, lower: no c/t/e bilaterally  Pelvic exam: defferred to OR    Lab Results   Component Value Date/Time    WBC 8.8 01/25/2019 10:43 AM    RBC 4.87 01/25/2019 10:43 AM    HGB 14.0 01/25/2019 10:43 AM    HCT 42.5 01/25/2019 10:43 AM    PLATELET 256 01/25/2019 10:43 AM         Assessment:   78 y.o. G2 P2002    Postmenopausal bleeding with ? Thickened endometrium on outside [place].     Past Medical History:   Diagnosis Date   ??? Essential hypertension    ??? Hematuria 01/2010    Dr. York Cerise, due to catheter, blood clot. [place] nl 2012   ??? 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  Dr. Stacie Acres   ??? Mixed hyperlipidemia    ??? Nausea & vomiting     PONV   ??? Osteopenia 01/2016   ??? Pap smear for cervical cancer screening 12/26/2018    Negative, HPV Negative   ??? Spider varicose veins    ??? Vitamin D deficiency 02/2012       Plan:     Procedure(s) (LRB):  HYSTEROSCOPY, DILATATION AND CURETTAGE, POSSIBLE POLYPECTOMY WITH TRUCLEAR (N/A)  Discussed the risks of surgery including the risks of bleeding, infection, deep vein thrombosis, and surgical injuries to internal organs including but not limited to the bowels, bladder, rectum, and female reproductive organs. The patient understands the risks; any and all questions were answered to the patient's satisfaction.  Patient desires surgical intervention.    All questions were answered.  Signed By:  Sallye Ober, MD     February 01, 2019        The patient was counseled at length about the risks of contracting Covid-19 during their perioperative period and any recovery window from their procedure.  The patient was made aware that contracting Covid-19  may worsen their prognosis for recovering from their procedure and lend to a higher morbidity and/or mortality risk.  All material risks, benefits, and reasonable alternatives including postponing the procedure were discussed. The patient does  wish to proceed with the procedure at this time.    WBC   Date Value Ref Range Status    01/25/2019 8.8 3.6 - 11.0 K/uL Final     RBC   Date Value Ref Range Status   01/25/2019 4.87 3.80 - 5.20 M/uL Final     HGB   Date Value Ref Range Status   01/25/2019 14.0 11.5 - 16.0 g/dL Final     HCT   Date Value Ref Range Status   01/25/2019 42.5 35.0 - 47.0 % Final     PLATELET   Date Value Ref Range Status   01/25/2019 256 150 - 400 K/uL Final     Lab Results   Component Value Date/Time    Sodium 138 01/25/2019 10:43 AM    Potassium 3.9 01/25/2019 10:43 AM    Chloride 106 01/25/2019 10:43 AM    CO2 28 01/25/2019 10:43 AM    Anion gap 4 (L) 01/25/2019 10:43 AM    Glucose 101 (H) 01/25/2019 10:43 AM    BUN 17 01/25/2019 10:43 AM    Creatinine 0.93 01/25/2019 10:43 AM    BUN/Creatinine ratio 18 01/25/2019 10:43 AM    GFR est AA >60 01/25/2019 10:43 AM    GFR est non-AA 58 (L) 01/25/2019 10:43 AM    Calcium 9.6 01/25/2019 10:43 AM    Bilirubin, total 0.3 12/17/2018 12:02 PM    Alk. phosphatase 98 12/17/2018 12:02 PM    Protein, total 7.3 12/17/2018 12:02 PM    Albumin 3.5 12/17/2018 12:02 PM    Globulin 3.8 12/17/2018 12:02 PM    A-G Ratio 0.9 (L) 12/17/2018 12:02 PM    ALT (SGPT) 14 12/17/2018 12:02 PM    AST (SGOT) 11 (L) 12/17/2018 12:02 PM

## 2019-01-31 NOTE — H&P (Signed)
Gynecology History and Physical    Name: Valerie Barry MRN: [id number] SSN: [id number]    Date of Birth: 1941-06-17  Age: 78 y.o.  Sex: female       Subjective:      Chief complaint:  Postmenopausal bleeding    Valerie Barry is a 78 y.o. female with a history of postmenopausal bleeding.   Previous workup included outside Ultrasound which showed: The uterus shows overall size of 6.7 x 4.5 x 2.9 cm. The  central endometrium measures 9 mm with no obvious endometrial mass or fluid  Collection.  Pt declined SIS, office endo bx and elected surgical evaluation.  She has still had bleeding on and off, last took megace 4 days ago.  ?? .   Previous treatment measures included megace. She is admitted for Procedure(s) (LRB):  HYSTEROSCOPY, DILATATION AND CURETTAGE, POSSIBLE POLYPECTOMY WITH TRUCLEAR (N/A).    Mammogram results:  No results found for this or any previous visit.    Last pap smear:  was normal    The current method of family planning is post menopausal status.    OB History     Gravida   2    Para   2    Term   2    Preterm        AB        Living   2       SAB        TAB        Ectopic        Molar        Multiple        Live Births   2              Past Medical History:   Diagnosis Date   ??? Essential hypertension    ??? Hematuria 01/2010    Dr. York Cerise, due to catheter, blood clot. [place] nl 2012   ??? 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  Dr. Stacie Acres   ??? Mixed hyperlipidemia    ??? Nausea & vomiting     PONV   ??? Osteopenia 01/2016   ??? Pap smear for cervical cancer screening 12/26/2018    Negative, HPV Negative   ??? Spider varicose veins    ??? Vitamin D deficiency 02/2012     Past Surgical History:   Procedure Laterality Date   ??? COLONOSCOPY  01/2010    normal   ??? HX KNEE ARTHROSCOPY      x4   ??? 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 Stacie Acres     Social History     Occupational History   ??? Occupation: daughter [name]     Employer: RETIRED   Tobacco Use   ??? Smoking status: Never Smoker   ??? Smokeless tobacco: Never Used    Substance and Sexual Activity   ??? Alcohol use: No   ??? Drug use: No   ??? Sexual activity: Not Currently     Partners: Male     Birth control/protection: None     Family History   Problem Relation Age of Onset   ??? Hypertension Mother    ??? Kidney Disease Mother    ??? Heart Disease Mother         afib   ??? Hypertension Brother    ??? Heart Disease Brother    ??? Lung Disease Father         emphysema   ???  Lung Disease Brother    ??? Diabetes Neg Hx    ??? Cancer Neg Hx      There are no active hospital problems to display for this patient.         Allergies   Allergen Reactions   ??? Codeine Nausea Only   ??? Lisinopril Other (comments)     Facial flushing   ??? Tylenol-Codeine #2 Nausea and Vomiting     Prior to Admission medications    Medication Sig Start Date End Date Taking? Authorizing Provider   megestroL (MEGACE) 20 mg tablet Take 1 Tab by mouth daily. Increase to 1 tab po BID after 3 days if needed for heavy bleeding.  Indications: Postmenopausal bleeding 01/02/19  Yes Saahir Prude, Jonelle Sidle, MD   telmisartan (MICARDIS) 80 mg tablet Take 1 Tab by mouth daily. 10/04/18  Yes Port, Balinda Quails, MD   chlorthalidone (HYGROTEN) 25 mg tablet Take 1 Tab by mouth daily. For blood pressure 10/04/18  Yes Port, Balinda Quails, MD   Cholecalciferol, Vitamin D3, (VITAMIN D3) 2,000 unit cap capsule Take 2,000 Units by mouth daily. 06/21/15  Yes Port, Balinda Quails, MD   multivit-min-iron-FA-lutein (CENTRUM SILVER WOMEN) 8 mg iron-400 mcg-300 mcg tab Take 1 daily 07/14/14   Port, Balinda Quails, MD        Review of Systems:  A comprehensive review of systems was negative except for that written in the History of Present Illness.     Objective:     Vitals:    02/01/19 0848   BP: 155/75   Pulse: (!) 109   Resp: 23   Temp: 98.1 ??F (36.7 ??C)   SpO2: 99%   Weight: 181 lb (82.1 kg)       Physical Exam:  Patient without distress.  Heart: Regular rate and rhythm or S1S2 present  Lung: clear to auscultation throughout lung fields, no wheezes, no rales, no rhonchi and normal respiratory effort   Abdomen: soft, nontender  Extremities, lower: no c/t/e bilaterally  Pelvic exam: defferred to OR    Lab Results   Component Value Date/Time    WBC 8.8 01/25/2019 10:43 AM    RBC 4.87 01/25/2019 10:43 AM    HGB 14.0 01/25/2019 10:43 AM    HCT 42.5 01/25/2019 10:43 AM    PLATELET 256 01/25/2019 10:43 AM         Assessment:   78 y.o. G2 P2002    Postmenopausal bleeding with ? Thickened endometrium on outside [place].     Past Medical History:   Diagnosis Date   ??? Essential hypertension    ??? Hematuria 01/2010    Dr. York Cerise, due to catheter, blood clot. [place] nl 2012   ??? 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  Dr. Stacie Acres   ??? Mixed hyperlipidemia    ??? Nausea & vomiting     PONV   ??? Osteopenia 01/2016   ??? Pap smear for cervical cancer screening 12/26/2018    Negative, HPV Negative   ??? Spider varicose veins    ??? Vitamin D deficiency 02/2012       Plan:     Procedure(s) (LRB):  HYSTEROSCOPY, DILATATION AND CURETTAGE, POSSIBLE POLYPECTOMY WITH TRUCLEAR (N/A)  Discussed the risks of surgery including the risks of bleeding, infection, deep vein thrombosis, and surgical injuries to internal organs including but not limited to the bowels, bladder, rectum, and female reproductive organs. The patient understands the risks; any and all questions were answered to the patient's satisfaction.  Patient desires surgical intervention.    All questions were answered.  Signed By:  Sallye Ober, MD     February 01, 2019        The patient was counseled at length about the risks of contracting Covid-19 during their perioperative period and any recovery window from their procedure.  The patient was made aware that contracting Covid-19  may worsen their prognosis for recovering from their procedure and lend to a higher morbidity and/or mortality risk.  All material risks, benefits, and reasonable alternatives including postponing the procedure were discussed. The patient does  wish to proceed with the procedure at this time.    WBC   Date Value Ref Range Status    01/25/2019 8.8 3.6 - 11.0 K/uL Final     RBC   Date Value Ref Range Status   01/25/2019 4.87 3.80 - 5.20 M/uL Final     HGB   Date Value Ref Range Status   01/25/2019 14.0 11.5 - 16.0 g/dL Final     HCT   Date Value Ref Range Status   01/25/2019 42.5 35.0 - 47.0 % Final     PLATELET   Date Value Ref Range Status   01/25/2019 256 150 - 400 K/uL Final     Lab Results   Component Value Date/Time    Sodium 138 01/25/2019 10:43 AM    Potassium 3.9 01/25/2019 10:43 AM    Chloride 106 01/25/2019 10:43 AM    CO2 28 01/25/2019 10:43 AM    Anion gap 4 (L) 01/25/2019 10:43 AM    Glucose 101 (H) 01/25/2019 10:43 AM    BUN 17 01/25/2019 10:43 AM    Creatinine 0.93 01/25/2019 10:43 AM    BUN/Creatinine ratio 18 01/25/2019 10:43 AM    GFR est AA >60 01/25/2019 10:43 AM    GFR est non-AA 58 (L) 01/25/2019 10:43 AM    Calcium 9.6 01/25/2019 10:43 AM    Bilirubin, total 0.3 12/17/2018 12:02 PM    Alk. phosphatase 98 12/17/2018 12:02 PM    Protein, total 7.3 12/17/2018 12:02 PM    Albumin 3.5 12/17/2018 12:02 PM    Globulin 3.8 12/17/2018 12:02 PM    A-G Ratio 0.9 (L) 12/17/2018 12:02 PM    ALT (SGPT) 14 12/17/2018 12:02 PM    AST (SGOT) 11 (L) 12/17/2018 12:02 PM

## 2019-02-01 ENCOUNTER — Ambulatory Visit
Admit: 2019-02-03 | Discharge: 2019-02-03 | Payer: MEDICARE | Attending: Obstetrics & Gynecology | Primary: Internal Medicine

## 2019-02-01 ENCOUNTER — Inpatient Hospital Stay: Payer: MEDICARE

## 2019-02-01 ENCOUNTER — Encounter: Payer: Self-pay | Admitting: Physician Assistant

## 2019-02-01 MED ORDER — FENTANYL CITRATE (PF) 50 MCG/ML IJ SOLN
50 mcg/mL | INTRAMUSCULAR | Status: DC | PRN
Start: 2019-02-01 — End: 2019-02-01

## 2019-02-01 MED ORDER — SODIUM CHLORIDE 0.9 % INJECTION
25 mg/mL | INTRAMUSCULAR | Status: DC | PRN
Start: 2019-02-01 — End: 2019-02-01

## 2019-02-01 MED ORDER — ALBUTEROL SULFATE 0.083 % (0.83 MG/ML) SOLN FOR INHALATION
2.5 mg /3 mL (0.083 %) | RESPIRATORY_TRACT | Status: DC | PRN
Start: 2019-02-01 — End: 2019-02-01

## 2019-02-01 MED ORDER — ONDANSETRON (PF) 4 MG/2 ML INJECTION
4 mg/2 mL | INTRAMUSCULAR | Status: DC | PRN
Start: 2019-02-01 — End: 2019-02-01

## 2019-02-01 MED ORDER — HYDROMORPHONE 0.5 MG/0.5 ML SYRINGE
0.5 mg/ mL | INTRAMUSCULAR | Status: DC | PRN
Start: 2019-02-01 — End: 2019-02-01
  Administered 2019-02-01: 16:00:00 via INTRAVENOUS

## 2019-02-01 MED ORDER — LIDOCAINE (PF) 10 MG/ML (1 %) IJ SOLN
10 mg/mL (1 %) | INTRAMUSCULAR | Status: DC | PRN
Start: 2019-02-01 — End: 2019-02-01

## 2019-02-01 MED ORDER — SODIUM CHLORIDE 0.9 % IJ SYRG
Freq: Three times a day (TID) | INTRAMUSCULAR | Status: DC
Start: 2019-02-01 — End: 2019-02-01

## 2019-02-01 MED ORDER — KETOROLAC TROMETHAMINE 30 MG/ML INJECTION
30 mg/mL (1 mL) | INTRAMUSCULAR | Status: AC
Start: 2019-02-01 — End: 2019-02-01
  Administered 2019-02-01: 15:00:00 via INTRAVENOUS

## 2019-02-01 MED ORDER — FAMOTIDINE (PF) 20 MG/2 ML IV
20 mg/2 mL | INTRAVENOUS | Status: DC | PRN
Start: 2019-02-01 — End: 2019-02-01
  Administered 2019-02-01: 14:00:00 via INTRAVENOUS

## 2019-02-01 MED ORDER — DIPHENHYDRAMINE HCL 50 MG/ML IJ SOLN
50 mg/mL | INTRAMUSCULAR | Status: DC | PRN
Start: 2019-02-01 — End: 2019-02-01

## 2019-02-01 MED ORDER — FENTANYL CITRATE (PF) 50 MCG/ML IJ SOLN
50 mcg/mL | INTRAMUSCULAR | Status: DC | PRN
Start: 2019-02-01 — End: 2019-02-01
  Administered 2019-02-01 (×2): via INTRAVENOUS

## 2019-02-01 MED ORDER — LIDOCAINE (PF) 20 MG/ML (2 %) IJ SOLN
20 mg/mL (2 %) | INTRAMUSCULAR | Status: DC | PRN
Start: 2019-02-01 — End: 2019-02-01
  Administered 2019-02-01: 14:00:00 via INTRAVENOUS

## 2019-02-01 MED ORDER — SUCCINYLCHOLINE CHLORIDE 20 MG/ML INJECTION
20 mg/mL | INTRAMUSCULAR | Status: DC | PRN
Start: 2019-02-01 — End: 2019-02-01
  Administered 2019-02-01: 14:00:00 via INTRAVENOUS

## 2019-02-01 MED ORDER — SODIUM CHLORIDE 0.9 % IJ SYRG
Freq: Three times a day (TID) | INTRAMUSCULAR | Status: DC
Start: 2019-02-01 — End: 2019-02-01
  Administered 2019-02-01: 14:00:00 via INTRAVENOUS

## 2019-02-01 MED ORDER — PROPOFOL 10 MG/ML IV EMUL
10 mg/mL | INTRAVENOUS | Status: DC | PRN
Start: 2019-02-01 — End: 2019-02-01
  Administered 2019-02-01 (×4): via INTRAVENOUS

## 2019-02-01 MED ORDER — FENTANYL CITRATE (PF) 50 MCG/ML IJ SOLN
50 mcg/mL | INTRAMUSCULAR | Status: AC
Start: 2019-02-01 — End: ?

## 2019-02-01 MED ORDER — SODIUM CHLORIDE 0.9 % IJ SYRG
INTRAMUSCULAR | Status: DC | PRN
Start: 2019-02-01 — End: 2019-02-01

## 2019-02-01 MED ORDER — LACTATED RINGERS IV
INTRAVENOUS | Status: DC
Start: 2019-02-01 — End: 2019-02-01

## 2019-02-01 MED ORDER — NALOXONE 0.4 MG/ML INJECTION
0.4 mg/mL | INTRAMUSCULAR | Status: DC | PRN
Start: 2019-02-01 — End: 2019-02-01

## 2019-02-01 MED ORDER — ONDANSETRON (PF) 4 MG/2 ML INJECTION
4 mg/2 mL | INTRAMUSCULAR | Status: DC | PRN
Start: 2019-02-01 — End: 2019-02-01
  Administered 2019-02-01: 14:00:00 via INTRAVENOUS

## 2019-02-01 MED ORDER — DEXAMETHASONE SODIUM PHOSPHATE 4 MG/ML IJ SOLN
4 mg/mL | INTRAMUSCULAR | Status: DC | PRN
Start: 2019-02-01 — End: 2019-02-01
  Administered 2019-02-01: 14:00:00 via INTRAVENOUS

## 2019-02-01 MED ORDER — FLUMAZENIL 0.1 MG/ML IV SOLN
0.1 mg/mL | INTRAVENOUS | Status: DC | PRN
Start: 2019-02-01 — End: 2019-02-01

## 2019-02-01 MED ORDER — LACTATED RINGERS IV
INTRAVENOUS | Status: DC
Start: 2019-02-01 — End: 2019-02-01
  Administered 2019-02-01: 13:00:00 via INTRAVENOUS

## 2019-02-01 MED ORDER — ROCURONIUM 10 MG/ML IV
10 mg/mL | INTRAVENOUS | Status: DC | PRN
Start: 2019-02-01 — End: 2019-02-01
  Administered 2019-02-01: 14:00:00 via INTRAVENOUS

## 2019-02-01 MED ORDER — IBUPROFEN 200 MG TAB
200 mg | ORAL_TABLET | Freq: Four times a day (QID) | ORAL | 0 refills | Status: AC | PRN
Start: 2019-02-01 — End: 2021-04-02

## 2019-02-01 MED FILL — LACTATED RINGERS IV: INTRAVENOUS | Qty: 1000

## 2019-02-01 MED FILL — FENTANYL CITRATE (PF) 50 MCG/ML IJ SOLN: 50 mcg/mL | INTRAMUSCULAR | Qty: 2

## 2019-02-01 MED FILL — KETOROLAC TROMETHAMINE 30 MG/ML INJECTION: 30 mg/mL (1 mL) | INTRAMUSCULAR | Qty: 1

## 2019-02-01 MED FILL — BD POSIFLUSH NORMAL SALINE 0.9 % INJECTION SYRINGE: INTRAMUSCULAR | Qty: 40

## 2019-02-01 MED FILL — HYDROMORPHONE 0.5 MG/0.5 ML SYRINGE: 0.5 mg/ mL | INTRAMUSCULAR | Qty: 0.5

## 2019-02-01 NOTE — Progress Notes (Addendum)
Virtual Visit via Telephone Note   This visit type was conducted due to national recommendations for restrictions regarding the COVID-19 Pandemic (e.g. social distancing) in an effort to limit this patient's exposure and mitigate transmission in our community.  Due to her co-morbid illnesses, this patient is at least at moderate risk for complications without adequate follow up.  This format is felt to be most appropriate for this patient at this time.  The patient did not have access to video technology/had technical difficulties with video requiring transitioning to audio format only (telephone).  All issues noted in this document were discussed and addressed.  No physical exam could be performed with this format.  The patient agreed to consent to participate in telehealth visit with [facility].   Date:  02/02/2019   ID:  [name] Leeder, DOB [birth year], MRN [id number]  Patient Location: Home Provider Location: Home  PCP:  Maurice SmallGriffin, [name], MD  Cardiologist:  Armanda Magicraci Turner, MD  Electrophysiologist:  None   Evaluation Performed:  Follow-Up Visit  Chief Complaint:  F/u CAD  History of Present Illness:    [name] Lusignan is a 78 y.o. 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 Venetia MaxonStern), PACs, PVCs who presents for overdue follow-up.  She traditionally can be difficult to get a hold of and has transportation issues making reliable follow-up challenging. She has also tended to be a vague historian at times. She previously saw Dr. Mayford Knifeurner for chest pain August 2017 which she felt was related to indigestion, partially relieved with Tums. Cardiac CT 02/2016 showed poor quality study due to motion artifact and somewhat poor filling of coronary  arteries with tortuosity, 2 isolated foci in LM (nonobstructive), calcium score 23rd percentile, normal left dominant coronary arteries with noted motion artifact and poor distal vessel visualization. PPI was titrated. Given continued intermittent chest pain, cardiac cath was recommended but initially deferred given ongoing follow-up of her brain aneurysm with plans to proceed after. The patient missed the radiology office's calls to schedule her aneurysm follow-up, then was admitted in the interim 04/2016 with chest pain and elevated troponin. Curbside to neurosurgery gave permission to proceed with cath, so this was done 04/21/16 revealed 25 mCx, 75% ostial D1, LVEF 35-45% in pattern of Takotsubo CM, and a 55 mmHg LVOT gradient felt due to subvalvular stenosis. There was severe tortuosity of the right subclavian making torquing catheters difficult (will need left radial access if cath needed in the future). There was no clear cause of stress for that particular event. 2D echo 05/2016 showed EF 60-65%, grade 1 DD, moderate focal hypertrophy of the basilar septum with a narrowed LV outflow tract and mild SAM. There was minimal LVOT gradient of 10mmHg, felt possibly HOCM variant. She underwent cardiac MRI 07/2016 showing moderate basal septal hypertrophy not classic for HOCM morphology with no delayed gadolium uptake to suggest myofibrillar disarray, + SAM with small LVOT gradient in regard to physiology and mild MR, mild LAE, no delayed gad uptake. She was seen back in clinic in 11/2017 with atypical chest discomfort. F/u echo 12/2017 showed EF 65-70%, severe LVH, grade 1DD, focal basal hypertrophy, mild aortic stenosis, mild LAE, mildly increased PASP. Nuclear stress test was ordered but the patient did not have this done until 02/2018 at which time it was low  risk, EF 74%. Imdur was stopped given her HCM and amlodipine was started although patient never started taking this and did not follow up until now. Last labs  09/2018 albumin 4.3, AST/ALT wnl, BUN 15, Cr 0.840, K 3.7, LDL 109, 11/2017 Hgb 12.7, plt 154, TSH wnl.  She is seen via telephone today overall in good spirits but continues to notice episodic chest discomfort anywhere from 2-3x a day lasting varying amounts of time. This is the same symptom she's had over the years. It might be getting a little more frequent lately. It feels like indigestion without specific pattern or provocation. She is taking famotidine but has not noticed a big improvement with symptoms. She sometimes can lay back and press on her chest and it resolves. Her breathing remains short with activity so she does not do much. She takes PRN Lasix for swelling. BP varies but occasionally runs 140s and above. The patient does not have symptoms concerning for COVID-19 infection (fever, chills, cough, or new shortness of breath).    Past Medical History:  Diagnosis Date   CAD in native artery    a. 04/2016 NSTEMI with cath showing 25% MCx, 75% DX1 lesion and moderate LVD 35-45% out of proportion to CAD and in a pattern of Takotsubo CM (no clear stressor)   Dyslipidemia    GERD (gastroesophageal reflux disease)    Hypercholesteremia    Hypertension    Hypertensive heart disease    initially she was thought to have a HOCM variant by cath with increased LVOT gradient but echo showed only a 10mmHg gradient across the LVOT and SAM and cardiac MRI did not show any late gad enhancement and only a 5mmHg LVOT gradient with 15mm BSH.  There was some mild SAM and mild MR.  EF normal at 76%.     LVH (left ventricular hypertrophy)    a. cMRI 2018 - showing moderate Basal septal hypertrophy not classic for HOCM morphology with no delayed gadolium uptake to suggest myofibrillar disarray, + SAM with small LVOT gradient in regard to physiology and mild MR.   Mild aortic stenosis    NSTEMI (non-ST elevated myocardial infarction) (HCC) 04/2016   secondary to stress CM - Takotsubo CM   PAC  (premature atrial contraction)    Event monitors 10/2013 and 08/2014 showed NSR, ST, PACs, PVCs.    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   EF normalized on repeat echo   Past Surgical History:  Procedure Laterality Date   ABDOMINAL HYSTERECTOMY     BELOW KNEE LEG AMPUTATION     converted to AKA 5/08   CARDIAC CATHETERIZATION N/A 04/21/2016   Procedure: Left Heart Cath and Coronary Angiography;  Surgeon: Corky CraftsJayadeep S Varanasi, MD;  [facility];  Service: Cardiovascular;  Laterality: N/A;   IR GENERIC HISTORICAL  02/13/2016   IR RADIOLOGY PERIPHERAL GUIDED IV START 02/13/2016 Berdine DanceMichael Shick, MD [facility]   IR GENERIC HISTORICAL  02/13/2016   IR US GUIDE VASC ACCESS LEFT 02/13/2016 Berdine DanceMichael Shick, MD [facility]     Current Meds  Medication Sig   acetaminophen (TYLENOL) 325 MG tablet Take 2 tablets (650 mg total) by mouth every 4 (four) hours as  needed for headache or mild pain.   aspirin EC 81 MG EC tablet Take 1 tablet (81 mg total) by mouth daily.   atorvastatin (LIPITOR) 40 MG tablet Take 40 mg by mouth at bedtime.   Calcium Carbonate-Vitamin D (CALCIUM-D PO) Take 1 tablet by mouth daily.   famotidine (PEPCID) 20 MG tablet Take 20 mg by mouth daily.   fluticasone (FLONASE) 50 MCG/ACT nasal spray Place 1 spray into both nostrils daily as needed (congestion).    fluticasone (FLOVENT HFA) 110 MCG/ACT inhaler Inhale 1 puff into the lungs as needed.    furosemide (LASIX) 20 MG tablet TAKE 1 TABLET (20 MG TOTAL) BY MOUTH DAILY AS NEEDED FOR FLUID OR EDEMA.   gabapentin (NEURONTIN) 300 MG capsule Take 300 mg by mouth 3 (three) times daily.   hydrochlorothiazide (HYDRODIURIL) 12.5 MG  tablet Take 12.5 mg by mouth daily.    metoprolol succinate (TOPROL-XL) 50 MG 24 hr tablet Take 1 tablet (50 mg total) by mouth daily. Please schedule an appt 1attempt   Multiple Vitamin (MULTIVITAMIN WITH MINERALS) TABS tablet Take 1 tablet by mouth daily.   Omega-3 Fatty Acids (FISH OIL PO) Take 1 capsule by mouth daily at 2 PM.      Allergies:   Ace inhibitors, Codeine, and Penicillins   Social History   Tobacco Use   Smoking status: Former Smoker    Quit date: 07/13/2004    Years since quitting: 14.5   Smokeless tobacco: Never Used  Substance Use Topics   Alcohol use: Yes    Comment: ocassionally    Drug use: No     Family Hx: The patient's family history includes Heart attack in her brother, mother, and sister; Heart disease in her brother, mother, and sister.  ROS:   Please see the history of present illness.    All other systems reviewed and are negative.   Prior CV studies:    Most recent pertinent cardiac studies are outlined above.  Labs/Other Tests and Data Reviewed:    EKG:  An ECG dated 03/03/18 (during nuc) was personally reviewed today and demonstrated:  sinus bradycardia 51bpm nonspecific TW changes.   Recent Labs: No results found for requested labs within last 8760 hours.   Recent Lipid Panel Lab Results  Component Value Date/Time   CHOL 176 04/22/2016 12:17 AM   TRIG 260 (H) 04/22/2016 12:17 AM   HDL 45 04/22/2016 12:17 AM   CHOLHDL 3.9 04/22/2016 12:17 AM   LDLCALC 79 04/22/2016 12:17 AM    Wt Readings from Last 3 Encounters:  02/02/19 143 lb (64.9 kg)  03/03/18 149 lb (67.6 kg)  11/23/17 149 lb (67.6 kg)     Objective:    Vital Signs:  BP (!) 147/61    Temp 98.6 F (37 C)    Ht 5\' 2"  (1.575 m)    Wt 143 lb (64.9 kg)    BMI 26.16 kg/m    VS reviewed. General - pleasant cheerful F in no acute distress Pulm - No labored breathing, no coughing during visit, no audible wheezing, speaking in full sentences Neuro - A+Ox3, no slurred  speech, answers questions appropriately Psych - Pleasant affect    ASSESSMENT & PLAN:    1. CAD - Ms. Meller has never been asymptomatic in the time that I have known her. She also tends to give a vague history, making assessment challenging - particularly moreso in a telemed visit. Her BP is running elevated. She never picked up amlodipine. Given h/o  possible HCM this may be less ideal choice so will first start by changing her metoprolol to carvedilol to try and optimize her BP (50mg  metoprolol = 6.25mg  BID of carvedilol, so will swap to 12.5mg  BID of carvedilol). Will arrange office f/u early next week. Watch for bradycardia. She was also encouraged to take famotidine regularly. May need to switch to PPI or see GI if cardiac eval unrevealing. Warning sx/ER precautions reviewed. Will need updated labs at her visit.  2. Hypertensive heart disease - adjust BB as above and f/u in office. This may be contributing to symptoms but has been historically challenging to tell. 3. Takotsubo cardiomyopathy - may need to consider repeat echo if any signs of fluid overload. Interestingly, the precipitant for this event was never completely clear. 4. Mild aortic stenosis - last echo reviewed above. Consider repeat echo based on clinical assessment.  COVID-19 Education: The signs and symptoms of COVID-19 were discussed with the patient and how to seek care for testing (follow up with PCP or arrange E-visit).  The importance of social distancing was discussed today.  Time:   Today, I have spent 20 minutes with the patient with telehealth technology discussing the above problems.     Medication Adjustments/Labs and Tests Ordered: Current medicines are reviewed at length with the patient today.  Concerns regarding medicines are outlined above.   Disposition:  Follow up in office early next week for in-person eval.  Signed, Laurann Montanaayna N , PA-C  02/02/2019 9:55 AM    [facility]

## 2019-02-01 NOTE — Other (Signed)
Discharge instructions given to patient's daughter Marzetta Board and she verbalized understanding. Patient being discharged home for self care with the help of her daughter and granddaughter.

## 2019-02-01 NOTE — Anesthesia Pre-Procedure Evaluation (Signed)
Relevant Problems   No relevant active problems       Anesthetic History     PONV          Review of Systems / Medical History  Patient summary reviewed and nursing notes reviewed    Pulmonary  Within defined limits                 Neuro/Psych   Within defined limits           Cardiovascular    Hypertension: well controlled              Exercise tolerance: >4 METS     GI/Hepatic/Renal  Within defined limits              Endo/Other  Within defined limits           Other Findings              Physical Exam    Airway  Mallampati: III    Neck ROM: normal range of motion   Mouth opening: Normal     Cardiovascular    Rhythm: regular  Rate: normal         Dental    Dentition: Caps/crowns     Pulmonary  Breath sounds clear to auscultation               Abdominal         Other Findings            Anesthetic Plan    ASA: 2  Anesthesia type: general          Induction: Intravenous  Anesthetic plan and risks discussed with: Patient      Informed consent obtained.

## 2019-02-01 NOTE — Anesthesia Post-Procedure Evaluation (Signed)
Procedure(s):  HYSTEROSCOPY, DILATATION AND CURETTAGE, POLYPECTOMY WITH TRUCLEAR.    general    Anesthesia Post Evaluation      Multimodal analgesia: multimodal analgesia not used between 6 hours prior to anesthesia start to PACU discharge  Patient location during evaluation: PACU  Patient participation: complete - patient participated  Level of consciousness: awake and alert  Pain score: 4 (cramping)  Pain management: satisfactory to patient  Airway patency: patent  Anesthetic complications: no  Cardiovascular status: hemodynamically stable and acceptable  Respiratory status: acceptable  Hydration status: acceptable  Comments: Patient seen and evaluated; no concerns.  Post anesthesia nausea and vomiting:  none      INITIAL Post-op Vital signs:   Vitals Value Taken Time   BP 130/60 02/01/2019 11:45 AM   Temp 36.7 ??C (98 ??F) 02/01/2019 11:02 AM   Pulse 85 02/01/2019 11:50 AM   Resp 17 02/01/2019 11:50 AM   SpO2 98 % 02/01/2019 11:50 AM   Vitals shown include unvalidated device data.

## 2019-02-01 NOTE — Progress Notes (Signed)
Pathology: Endometrial adenocarcinoma, favor grade 2 endometrioid type ]  dw pt by phone 02/06/2019 8:57 AM  Pt prefers gynonc in [place].  Pt can cancel postop fu with me next week because she will see Gyn Onc  Pt will restart megace because having PMP bleeding  Notify patient.  Update FS per pathology protocol   Update PSH:  Include date of procedure  procedure name (check op note prn),   MD performing surgery initials in comments  pathology results in comments, when available: cut and paste prn

## 2019-02-01 NOTE — Progress Notes (Signed)
Updated PMH, placed referral to to GYN oncology per MD instructions. Called and verified appointment information with patient.  Patient has verbalized understanding.

## 2019-02-01 NOTE — Interval H&P Note (Signed)
Discharge instructions given to patient's daughter Marzetta Board and she verbalized understanding. Patient being discharged home for self care with the help of her daughter and granddaughter.

## 2019-02-01 NOTE — Op Note (Signed)
Brief Postoperative Note    Patient: Valerie Barry  Date of Birth: 1941-05-23  MRN: [id number]    Date of Procedure: 02/01/2019     Pre-Op Diagnosis: POST MENOPAUSAL BLEEDING    Post-Op Diagnosis: same posterior wall mass/myoma  Cervical stenosis    Procedure(s):  HYSTEROSCOPY, DILATATION AND CURETTAGE, Partial myomectomy with truclear    Surgeon(s):  Sallye Ober, MD    Surgical Assistant: OR staff    Anesthesia: General     Estimated Blood Loss (mL): less than 371     Complications: None    Specimens: endometrial curettings and portion of myoma    Implants: * No implants in log *  None    Drains: none    Findings: stenotic cervix, posterior wall mass with firmness c/w myoma    Electronically Signed by Sallye Ober, MD on 02/01/2019 at 9:26 AM

## 2019-02-01 NOTE — Op Note (Signed)
HYSTEROSCOPY D & C  FULL OP NOTE    Patient:  Valerie Barry  DATE OF PROCEDURE:  02/01/2019    PREOPERATIVE DIAGNOSIS:  Postmenopausal bleeding  POSTOPERATIVE DIAGNOSIS:  Same, posterior wall mass consistent with myoma    PROCEDURE:  Hysteroscopy, dilation and curettage, hysteroscopic curettings and partial myomectomy    SURGEON:  Sallye Ober, MD    ASSISTANT:  OR staff    ANESTHESIA: General endotracheal anesthesia    EBL: <100cc     FINDINGS: posterior wall mass, about 2 cm    Specimen:  Endometrial curettings hysteroscopically resected and partial resection of myoma    Hysteroscopic fluid deficit: 211 cc    Complications: none    PROCEDURE: The patient was placed on the operating table in the supine position.  Patient was placed under anesthesia. She was prepped and draped in the usual fashion for vaginal surgery and her bladder was emptied with a straight catheterization. Time out was done to confirm the operating procedure, surgeon, patient and site.     A bimanual exam revealed a small posterior position uterus and closed cervix cervix with a small amount of blood.    The cervix was visualized with the aid of a bivalve vaginal speculum and grasped with a single-tooth tenaculum and serially dilated to allow passage of the diagnostic hysteroscope.  Normal saline was infused to allow visualization of the uterine cavity and atrophic appearing ostia and posterior wall mass ~2cm.    The Truclear Mini hysteroscopic resector was introduced under direct visualization and window locked. The lining was sampling and about 1/3 of the myoma was removed and a hysteroscopic curretage was performed of all of the uterine surfaces.  The entire myoma was not removed because of limitations with mini shaver, the dense tissue mini shaver was not available (expired) and the decision was made   not to further dilate the stenotic cervix and place larger diameter hysteroscope because a majority sample of the endometrium and surface  of the myoma was removed.    The hysteroscope was removed.  The tissue was sent to pathology.  A small abrasion and the posterior fourchette was noted and made hemostatic with pressure.    There was minimal bleeding. Instruments were removed.  All counts were correct.   The patient went to the recovery room in satisfactory condition.       Greig Castilla, MD, Cherlynn June

## 2019-02-01 NOTE — Brief Op Note (Signed)
Brief Postoperative Note    Patient: Valerie Barry  Date of Birth: 08-26-1940  MRN: [id number]    Date of Procedure: 02/01/2019     Pre-Op Diagnosis: POST MENOPAUSAL BLEEDING    Post-Op Diagnosis: same posterior wall mass/myoma  Cervical stenosis    Procedure(s):  HYSTEROSCOPY, DILATATION AND CURETTAGE, Partial myomectomy with truclear    Surgeon(s):  Sallye Ober, MD    Surgical Assistant: OR staff    Anesthesia: General     Estimated Blood Loss (mL): less than 132     Complications: None    Specimens: endometrial curettings and portion of myoma    Implants: * No implants in log *  None    Drains: none    Findings: stenotic cervix, posterior wall mass with firmness c/w myoma    Electronically Signed by Sallye Ober, MD on 02/01/2019 at 9:26 AM

## 2019-02-01 NOTE — Op Note (Signed)
HYSTEROSCOPY D & C  FULL OP NOTE    Patient:  Valerie Barry  DATE OF PROCEDURE:  02/01/2019    PREOPERATIVE DIAGNOSIS:  Postmenopausal bleeding  POSTOPERATIVE DIAGNOSIS:  Same, posterior wall mass consistent with myoma    PROCEDURE:  Hysteroscopy, dilation and curettage, hysteroscopic curettings and partial myomectomy    SURGEON:  Sallye Ober, MD    ASSISTANT:  OR staff    ANESTHESIA: General endotracheal anesthesia    EBL: <100cc     FINDINGS: posterior wall mass, about 2 cm    Specimen:  Endometrial curettings hysteroscopically resected and partial resection of myoma    Hysteroscopic fluid deficit: 790 cc    Complications: none    PROCEDURE: The patient was placed on the operating table in the supine position.  Patient was placed under anesthesia. She was prepped and draped in the usual fashion for vaginal surgery and her bladder was emptied with a straight catheterization. Time out was done to confirm the operating procedure, surgeon, patient and site.     A bimanual exam revealed a small posterior position uterus and closed cervix cervix with a small amount of blood.    The cervix was visualized with the aid of a bivalve vaginal speculum and grasped with a single-tooth tenaculum and serially dilated to allow passage of the diagnostic hysteroscope.  Normal saline was infused to allow visualization of the uterine cavity and atrophic appearing ostia and posterior wall mass ~2cm.    The Truclear Mini hysteroscopic resector was introduced under direct visualization and window locked. The lining was sampling and about 1/3 of the myoma was removed and a hysteroscopic curretage was performed of all of the uterine surfaces.  The entire myoma was not removed because of limitations with mini shaver, the dense tissue mini shaver was not available (expired) and the decision was made   not to further dilate the stenotic cervix and place larger diameter hysteroscope because a majority  sample of the endometrium and surface of the myoma was removed.    The hysteroscope was removed.  The tissue was sent to pathology.  A small abrasion and the posterior fourchette was noted and made hemostatic with pressure.    There was minimal bleeding. Instruments were removed.  All counts were correct.   The patient went to the recovery room in satisfactory condition.       Greig Castilla, MD, Cherlynn June

## 2019-02-02 ENCOUNTER — Telehealth: Payer: Self-pay | Admitting: *Deleted

## 2019-02-02 ENCOUNTER — Encounter: Payer: Self-pay | Admitting: Physician Assistant

## 2019-02-02 ENCOUNTER — Telehealth (INDEPENDENT_AMBULATORY_CARE_PROVIDER_SITE_OTHER): Payer: Medicare Other | Admitting: Physician Assistant

## 2019-02-02 ENCOUNTER — Other Ambulatory Visit: Payer: Self-pay

## 2019-02-02 VITALS — BP 147/61 | Temp 98.6°F | Ht 62.0 in | Wt 143.0 lb

## 2019-02-02 DIAGNOSIS — I2510 Atherosclerotic heart disease of native coronary artery without angina pectoris: Secondary | ICD-10-CM | POA: Diagnosis not present

## 2019-02-02 DIAGNOSIS — I350 Nonrheumatic aortic (valve) stenosis: Secondary | ICD-10-CM

## 2019-02-02 DIAGNOSIS — I119 Hypertensive heart disease without heart failure: Secondary | ICD-10-CM

## 2019-02-02 DIAGNOSIS — I5181 Takotsubo syndrome: Secondary | ICD-10-CM

## 2019-02-02 MED ORDER — CARVEDILOL 12.5 MG PO TABS: 12.5000 mg | ORAL_TABLET | Freq: Two times a day (BID) | ORAL | 3 refills | Status: DC

## 2019-02-02 NOTE — Patient Instructions (Signed)
Medication Instructions:  Your physician has recommended you make the following change in your medication:  1.  STOP the Metoprolol 2.  START Carvedilol 12.5 taking 1 tablet twice a day   If you need a refill on your cardiac medications before your next appointment, please call your pharmacy.   Lab work: None ordered  If you have labs (blood work) drawn today and your tests are completely normal, you will receive your results only by: [name] MyChart Message (if you have MyChart) OR . A paper copy in the mail If you have any lab test that is abnormal or we need to change your treatment, we will call you to review the results.  Testing/Procedures: None ordered  Follow-Up: At [facility], you and your health needs are our priority.  As part of our continuing mission to provide you with exceptional heart care, we have created designated Provider Care Teams.  These Care Teams include your primary Cardiologist (physician) and Advanced Practice Providers (APPs -  Physician Assistants and Nurse Practitioners) who all work together to provide you with the care you need, when you need it. [name] You are scheduled for an in office visit for chest pain / sob, tomorrow, 02/03/2019 at 9:00 with Vin Bhagat, PA-C.    Any Other Special Instructions Will Be Listed Below (If Applicable).

## 2019-02-02 NOTE — Telephone Encounter (Signed)
Call placed to pt re: appt for chest pain / sob, per Melina Copa, PA-C.  Pt is scheduled to see Robbie Lis, PA-C, 02/03/2019 and has answered NO to the following Covid19 prescreen questions:      COVID-19 Pre-Screening Questions:  . In the past 7 to 10 days have you had a cough,  shortness of breath, headache, congestion, fever (100 or greater) body aches, chills, sore throat, or sudden loss of taste or sense of smell? . Have you been around anyone with known Covid 19. . Have you been around anyone who is awaiting Covid 19 test results in the past 7 to 10 days? . Have you been around anyone who has been exposed to Covid 19, or has mentioned symptoms of Covid 19 within the past 7 to 10 days?  If you have any concerns/questions about symptoms patients report during screening (either on the phone or at threshold). Contact the provider seeing the patient or DOD for further guidance.  If neither are available contact a member of the leadership team.

## 2019-02-02 NOTE — Progress Notes (Signed)
Cardiology Office Note    Date:  02/03/2019   ID:  [name], DOB May 08, 1941, MRN [id number]  PCP:  Maurice SmallGriffin, [name], MD  Cardiologist:  Dr.Turner  Chief Complaint: CP and SOB  History of Present Illness:   [name] is a 78 y.o. 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   Cardiac cath 04/21/16 revealed 25 mCx, 75% ostial D1, LVEF 35-45% in pattern of Takotsubo CM, and a 55 mmHg LVOT gradient felt due to subvalvular stenosis. Echo 05/2016 showed EF 60-65%, grade 1 DD, moderate focal hypertrophy of the basilar septum with a narrowed LV outflow tract and mild SAM. There was minimal LVOT gradient of 10mmHg, felt possibly HOCM variant. She underwent cardiac MRI 07/2016 showing moderate basal septal hypertrophy not classic for HOCM morphology with no delayed gadolium uptake to suggest myofibrillar disarray, + SAM with small LVOT gradient in regard to physiology and mild MR, mild LAE, no delayed gad uptake.  Patient has longstanding history of atypical chest discomfort.  Poor historian.  Last stress test August 2019 was low risk.  Seen by Dingeldein yesterday virtually for follow-up.  Noted chest pain with shortness of breath.  Blood pressure elevated.  Change metoprolol to Coreg.  Here today for follow-up.  She has not got Coreg yet.  Blood pressure elevated during office visit at 162/72.  Did not took Toprol this morning as she is going to pick up her Coreg after office visit.  Patient reports worsening intensity and frequency of chest pain associated with shortness of breath.  She describes her chest pain as a "tooth ache".  No radiation, nausea, vomiting or diaphoresis.  However, patient endorsed palpitation whenever she has chest  pain.  She did not think this is due to her GERD.  She denies dizziness, lower extremity edema, orthopnea, PND or syncope.  She sleeps on recliner chronically.  Use prosthesis on right leg.     Past Medical History:  Diagnosis Date  . CAD in native artery    a. 04/2016 NSTEMI with cath showing 25% MCx, 75% DX1 lesion and moderate LVD 35-45% out of proportion to CAD and in a pattern of Takotsubo CM (no clear stressor)  . Dyslipidemia   . GERD (gastroesophageal reflux disease)   . Hypercholesteremia   . Hypertension   . Hypertensive heart disease    initially she was thought to have a HOCM variant by cath with increased LVOT gradient but echo showed only a 10mmHg gradient across the LVOT and SAM and cardiac MRI did not show any late gad enhancement and only a 5mmHg LVOT gradient with 15mm BSH.  There was some mild SAM and mild MR.  EF normal at 76%.    [name]. LVH (left ventricular hypertrophy)    a. cMRI 2018 - showing moderate Basal septal hypertrophy not classic for HOCM morphology with no delayed gadolium uptake to suggest myofibrillar disarray, + SAM with small LVOT gradient in regard to physiology and mild MR.  . Mild aortic stenosis   . NSTEMI (non-ST elevated myocardial infarction) (HCC) 04/2016   secondary to stress CM - Takotsubo CM  . PAC (premature atrial contraction)    Event monitors 10/2013 and 08/2014 showed NSR, ST, PACs, PVCs.   [name]. 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 Posterior communicating artery aneurysm    followed by Dr Venetia MaxonStern  . PVC's (premature ventricular contractions)    Event monitors 10/2013 and 08/2014 showed NSR, ST, PACs, PVCs.   . 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 ABDOMINAL HYSTERECTOMY    . 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 CARDIAC CATHETERIZATION N/A 04/21/2016   Procedure: Left  Heart Cath and Coronary Angiography;  Surgeon: Corky CraftsJayadeep S Varanasi, MD;  [facility];  Service: Cardiovascular;  Laterality: N/A;  . IR GENERIC HISTORICAL  02/13/2016   IR RADIOLOGY PERIPHERAL GUIDED IV START 02/13/2016 Berdine DanceMichael Shick, MD [facility]-INTERV RAD  . IR GENERIC HISTORICAL  02/13/2016   IR US GUIDE VASC ACCESS LEFT 02/13/2016 Berdine DanceMichael Shick, MD [facility]    Current Medications: Prior to Admission medications   Medication Sig Start Date End Date Taking? Authorizing Provider  acetaminophen (TYLENOL) 325 MG tablet Take 2 tablets (650 mg total) by mouth every 4 (four) hours as needed for headache or mild pain. 04/23/16   Abelino DerrickKilroy, Luke K, PA-C  aspirin EC 81 MG EC tablet Take 1 tablet (81 mg total) by mouth daily. 04/23/16   Abelino DerrickKilroy, Luke K, PA-C  atorvastatin (LIPITOR) 40 MG tablet Take 40 mg by mouth at bedtime.    [provider]  Calcium Carbonate-Vitamin D (CALCIUM-D PO) Take 1 tablet by mouth daily.    [provider]  carvedilol (COREG) 12.5 MG tablet Take 1 tablet (12.5 mg total) by mouth 2 (two) times daily. 02/02/19 05/03/19  Dunn, Tacey Ruizayna N, PA-C  famotidine (PEPCID) 20 MG tablet Take 20 mg by mouth daily.    [provider]  fluticasone (FLONASE) 50 MCG/ACT nasal spray Place 1 spray into both nostrils daily as needed (congestion).  05/10/17   [provider]  fluticasone (FLOVENT HFA) 110 MCG/ACT inhaler Inhale 1 puff into the lungs as needed.  05/10/17   [provider]  furosemide (LASIX) 20 MG tablet TAKE 1 TABLET (20 MG TOTAL) BY MOUTH DAILY AS NEEDED FOR FLUID OR EDEMA. 01/21/18   Dunn, Tacey Ruizayna N, PA-C  gabapentin (NEURONTIN) 300 MG capsule Take 300 mg by mouth 3 (three) times daily.    [provider]  hydrochlorothiazide (HYDRODIURIL) 12.5 MG tablet Take 12.5 mg by mouth daily.  09/08/16   [provider]  Multiple Vitamin (MULTIVITAMIN WITH MINERALS) TABS tablet Take 1 tablet by mouth daily.    [provider]  Omega-3 Fatty Acids (FISH OIL PO) Take 1 capsule by mouth daily at 2 PM.     [provider]    Allergies:   Ace inhibitors, Codeine, and Penicillins   Social History   Socioeconomic History  . Marital status: Single    Spouse name: Not on file  . Number of children: Not on file  . Years of education: Not on file  . Highest education level: Not on file  Occupational History  . Not on file  Social Needs  . Financial resource strain: Not on file  . Food insecurity    Worry: Not on file    Inability: Not on file  . Transportation needs    Medical: Not on file    Non-medical: Not on file  Tobacco Use  . Smoking status: Former Smoker  Quit date: 07/13/2004    Years since quitting: 14.5  . Smokeless tobacco: Never Used  Substance and Sexual Activity  . Alcohol use: Yes    Comment: ocassionally   . Drug use: No  . Sexual activity: Not on file  Lifestyle  . Physical activity    Days per week: Not on file    Minutes per session: Not on file  . Stress: Not on file  Relationships  . Social Herbalist on phone: Not on file    Gets together: Not on file    Attends religious service: Not on file    Active member of club or organization: Not on file    Attends meetings of clubs or organizations: Not on file    Relationship status: Not on file  Other Topics Concern  . Not on file  Social History Narrative  . Not on file     Family History:  The patient's family history includes Heart attack in her brother, mother, and sister; Heart disease in her brother, mother, and sister.   ROS:   Please see the history of present illness.    ROS All other systems reviewed and are negative.   PHYSICAL EXAM:   VS:  BP (!) 162/72   Pulse 64   Ht 5\' 2"  (1.575 m)   Wt 145 lb 3.2 oz (65.9 kg)   SpO2 98%   BMI 26.56 kg/m    GEN: Well nourished, well developed, in no acute distress  HEENT: normal  Neck: no JVD, carotid bruits, or masses Cardiac:  RRR; no murmurs, rubs, or gallops,no edema  Respiratory:  clear to auscultation bilaterally, normal work of breathing GI: soft, nontender, nondistended, + BS MS: Right leg prosthesis Skin: warm and dry, no rash Neuro:  Alert and Oriented x 3, Strength and sensation are intact Psych: euthymic mood, full affect  Wt Readings from Last 3 Encounters:  02/03/19 145 lb 3.2 oz (65.9 kg)  02/02/19 143 lb (64.9 kg)  03/03/18 149 lb (67.6 kg)      Studies/Labs Reviewed:   EKG:  EKG is ordered today.  The ekg ordered today demonstrates normal sinus rhythm at rate of 64 bpm  Recent Labs: No results found for requested labs within last 8760 hours.   Lipid Panel    Component Value Date/Time   CHOL 176 04/22/2016 0017   TRIG 260 (H) 04/22/2016 0017   HDL 45 04/22/2016 0017   CHOLHDL 3.9 04/22/2016 0017   VLDL 52 (H) 04/22/2016 0017   LDLCALC 79 04/22/2016 0017    Additional studies/ records that were reviewed today include:   Stress test 02/2018  Nuclear stress EF: 74%.  Normal perfusion No ischemia or scar  This is a low risk study.   Echocardiogram: 12/2017 Study Conclusions  - Left ventricle: The cavity size was normal. Wall thickness was   increased in a pattern of moderate to severe LVH. There was focal   basal hypertrophy. Systolic function was vigorous. The estimated   ejection fraction was in the range of 65% to 70%. Wall motion was   normal; there were no regional wall motion abnormalities. Doppler   parameters are consistent with abnormal left ventricular   relaxation (grade 1 diastolic dysfunction). - Aortic valve: Mildly calcified annulus. There was mild stenosis. - Left atrium: The atrium was mildly dilated. - Pulmonary arteries: Systolic pressure was mildly increased. PA   peak pressure: 33 mm Hg (S).  Cardiac MRI 07/2016 IMPRESSION: 1)  Moderate Basal septal hypertrophy not classic for HOCM morphology with no delayed gadolium uptake to suggest myofibrillar  disarray  2) There was SAM with small LVOT gradient in regard to physiology and mild MR  3) Mild LAE  4) No delayed gadolinium uptake on inversion recovery sequences.  Charlton Haws   Left Heart Cath and Coronary Angiography  04/2016  Conclusion    Mid Cx lesion, 25 %stenosed.  Ost 1st Diag lesion, 75 %stenosed.  The left ventricular ejection fraction is 35-45% by visual estimate.  There is mild to moderate left ventricular systolic dysfunction in a pattern of Takotsubo cardiomyopathy.  LV end diastolic pressure is mildly elevated.  50-55 mm Hg resting gradient across the LVOT, likely due to subvalvular stenosis. No difficulty crossing the aortic valve.  Severe tortuosity of the right subclavian making torquing catheters difficult. Consider left radial if cath needed in the future.   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  ACE-I in AM if renal function stable.  No clear source of stress that may have caused this episode.         ASSESSMENT & PLAN:    1. Chest pain/palpitations/shortness of breath Patient reports chest pain and shortness of breath whenever she has palpitation.  She did not had a single episode of palpitation without chest pain.  Getting more frequent and intense.  Last episode, last night lasting for 20 minutes.  She goes without palpitation for few days.  Occurs between 5 to 20 minutes.  No syncope.  No change in diet.  Not particularly associated with exertion. Patient takes Lasix Tuesday Thursday Saturday however unsure if she takes hydrochlorothiazide 12.5 mg daily or not.  Will check electrolytes today.  She will review her medication with nurse when we call for results.  She will bring her medication for follow-up.  We will get monitor.  2.  Hypertensive heart disease -Blood pressure elevated during office visit.[name]  She will start her Coreg today. -Review medication at follow-up.  3.  Mild aortic stenosis -Last echo  12/2017 as above.  No significant murmur heard.  Follow clinically.  No evidence of volume overload.  4.  History of Takotsubo cardiomyopathy -Unclear etiology previously. However symptoms and exam not consistent with cardiomyopathy.   Medication Adjustments/Labs and Tests Ordered: Current medicines are reviewed at length with the patient today.  Concerns regarding medicines are outlined above.  Medication changes, Labs and Tests ordered today are listed in the Patient Instructions below. Patient Instructions  Medication Instructions:  Your physician has recommended you make the following change in your medication:  Pick up the new medication that Dayna sent for you yesterday.  Coreg 12.5 mg and you will take 1 tablet twice a day   If you need a refill on your cardiac medications before your next appointment, please call your pharmacy.   Lab work: TODAY:  BMET & MAG If you have labs (blood work) drawn today and your tests are completely normal, you will receive your results only by: [name] MyChart Message (if you have MyChart) OR . A paper copy in the mail If you have any lab test that is abnormal or we need to change your treatment, we will call you to review the results.  Testing/Procedures: Your physician has recommended that you wear an event monitor. Event monitors are medical devices that record the heart's electrical activity. Doctors most often [place] these monitors to diagnose arrhythmias. Arrhythmias are problems with the speed or rhythm of the heartbeat. The  monitor is a small, portable device. You can wear one while you do your normal daily activities. This is usually used to diagnose what is causing palpitations/syncope (passing out).    Follow-Up: At [facility], you and your health needs are our priority.  As part of our continuing mission to provide you with exceptional heart care, we have created designated Provider Care Teams.  These Care Teams include your primary Cardiologist  (physician) and Advanced Practice Providers (APPs -  Physician Assistants and Nurse Practitioners) who all work together to provide you with the care you need, when you need it. You will need a follow up appointment 6 weeks after your monitor has been placed.  We will call you with this appointment.  Any Other Special Instructions Will Be Listed Below (If Applicable).       Lorelei PontSigned,  , GeorgiaPA  02/03/2019 9:34 AM    [facility] [address], [place], [place]  [address] Phone: [phone]; Fax: [phone]

## 2019-02-03 ENCOUNTER — Telehealth: Payer: Self-pay | Admitting: Radiology

## 2019-02-03 ENCOUNTER — Encounter: Payer: Self-pay | Admitting: Physician Assistant

## 2019-02-03 ENCOUNTER — Ambulatory Visit (INDEPENDENT_AMBULATORY_CARE_PROVIDER_SITE_OTHER): Payer: Medicare Other | Admitting: Physician Assistant

## 2019-02-03 ENCOUNTER — Other Ambulatory Visit: Payer: Self-pay

## 2019-02-03 VITALS — BP 162/72 | HR 64 | Ht 62.0 in | Wt 145.2 lb

## 2019-02-03 DIAGNOSIS — R079 Chest pain, unspecified: Secondary | ICD-10-CM

## 2019-02-03 DIAGNOSIS — R0602 Shortness of breath: Secondary | ICD-10-CM

## 2019-02-03 DIAGNOSIS — I350 Nonrheumatic aortic (valve) stenosis: Secondary | ICD-10-CM

## 2019-02-03 DIAGNOSIS — I2510 Atherosclerotic heart disease of native coronary artery without angina pectoris: Secondary | ICD-10-CM

## 2019-02-03 DIAGNOSIS — R002 Palpitations: Secondary | ICD-10-CM

## 2019-02-03 DIAGNOSIS — I5181 Takotsubo syndrome: Secondary | ICD-10-CM

## 2019-02-03 DIAGNOSIS — I119 Hypertensive heart disease without heart failure: Secondary | ICD-10-CM

## 2019-02-03 LAB — BASIC METABOLIC PANEL
BUN/Creatinine Ratio: 12 (ref 12–28)
BUN: 11 mg/dL (ref 8–27)
CO2: 22 mmol/L (ref 20–29)
Calcium: 9.4 mg/dL (ref 8.7–10.3)
Chloride: 104 mmol/L (ref 96–106)
Creatinine, Ser: 0.89 mg/dL (ref 0.57–1.00)
GFR calc Af Amer: 71 mL/min/{1.73_m2} (ref >59)
GFR calc non Af Amer: 61 mL/min/{1.73_m2} (ref >59)
Glucose: 97 mg/dL (ref 65–99)
Potassium: 4 mmol/L (ref 3.5–5.2)
Sodium: 142 mmol/L (ref 134–144)

## 2019-02-03 LAB — MAGNESIUM: Magnesium: 2.1 mg/dL (ref 1.6–2.3)

## 2019-02-03 NOTE — Patient Instructions (Addendum)
Medication Instructions:  Your physician has recommended you make the following change in your medication:  Pick up the new medication that [facility] sent for you yesterday.  Coreg 12.5 mg and you will take 1 tablet twice a day   If you need a refill on your cardiac medications before your next appointment, please call your pharmacy.   Lab work: TODAY:  BMET & MAG If you have labs (blood work) drawn today and your tests are completely normal, you will receive your results only by: [name] MyChart Message (if you have MyChart) OR . A paper copy in the mail If you have any lab test that is abnormal or we need to change your treatment, we will call you to review the results.  Testing/Procedures: Your physician has recommended that you wear an event monitor. Event monitors are medical devices that record the heart's electrical activity. Doctors most often [place] these monitors to diagnose arrhythmias. Arrhythmias are problems with the speed or rhythm of the heartbeat. The monitor is a small, portable device. You can wear one while you do your normal daily activities. This is usually used to diagnose what is causing palpitations/syncope (passing out).    Follow-Up: At [facility], you and your health needs are our priority.  As part of our continuing mission to provide you with exceptional heart care, we have created designated Provider Care Teams.  These Care Teams include your primary Cardiologist (physician) and Advanced Practice Providers (APPs -  Physician Assistants and Nurse Practitioners) who all work together to provide you with the care you need, when you need it. You will need a follow up appointment 6 weeks after your monitor has been placed.  We will call you with this appointment.  Any Other Special Instructions Will Be Listed Below (If Applicable).

## 2019-02-03 NOTE — Progress Notes (Signed)
Pt has been made aware of normal result and verbalized understanding.  jw 02/03/2019

## 2019-02-03 NOTE — Telephone Encounter (Signed)
Enrolled patient for [employer] Event monitor. Brief instructions were gone over with the patient and she knows to expect the monitor to arrive in 3-4 days

## 2019-02-07 NOTE — Telephone Encounter (Signed)
Call received at 10:20am      78 year old patient last seen in the office on 02/01/2019 for Procedure(s):  HYSTEROSCOPY, DILATATION AND CURETTAGE, POLYPECTOMY WITH TRUCLEAR.    Patient calling to say she has not heard about her appointment with Dr. Drue Flirt ?      Patient was advised yesterday that a consultation would be set up with gyn oncologist.    Please call the patient at [phone] with appointment details.

## 2019-02-08 ENCOUNTER — Encounter: Attending: Obstetrics & Gynecology | Primary: Internal Medicine

## 2019-02-08 NOTE — Telephone Encounter (Signed)
Patient has appointment tomorrow with Dr. Nilda Simmer, GYN ONC.    They are missing records.  They needs any imaging, pathology, office notes and labs (especially if any cancer labs done) sent to them.    FAX [phone]

## 2019-02-08 NOTE — Telephone Encounter (Signed)
Faxing to them now.

## 2019-02-13 ENCOUNTER — Encounter (INDEPENDENT_AMBULATORY_CARE_PROVIDER_SITE_OTHER): Payer: Medicare Other

## 2019-02-13 DIAGNOSIS — R0602 Shortness of breath: Secondary | ICD-10-CM | POA: Diagnosis not present

## 2019-02-13 DIAGNOSIS — R079 Chest pain, unspecified: Secondary | ICD-10-CM | POA: Diagnosis not present

## 2019-02-13 DIAGNOSIS — R002 Palpitations: Secondary | ICD-10-CM

## 2019-02-15 ENCOUNTER — Ambulatory Visit: Payer: MEDICARE | Attending: Obstetrics & Gynecology | Primary: Internal Medicine

## 2019-03-24 ENCOUNTER — Other Ambulatory Visit: Payer: Self-pay

## 2019-03-24 NOTE — Progress Notes (Signed)
Cardiology Office Note    Date:  03/27/2019   ID:  [name], DOB 1941-07-11, MRN [id number]  PCP:  Maurice SmallGriffin, [name], MD  Cardiologist: Dr. Mayford Knifeurner  Chief Complaint: 6 weeks follow-up  History of Present Illness:   [name] is a 78 y.o. female with hx of  CAD 2nd to NSTEMI in 05/2016 felt due to Takotsubo CM,hypertensive heart diseae/atypical HOCM, mild aortic stenosis, posterior noncommunicating artery aneurysm (followed by Dr. 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   Cardiac cath 04/21/16 revealed 25 mCx, 75% ostial D1, LVEF 35-45% in pattern of Takotsubo CM, and a 55 mmHg LVOT gradient felt due to subvalvular stenosis. Echo 05/2016 showed EF 60-65%, grade 1 DD, moderate focal hypertrophy of the basilar septum with a narrowed LV outflow tract and mild SAM. There was minimal LVOT gradient of 10mmHg, felt possibly HOCM variant. She underwent cardiac MRI 07/2016 showing moderate basal septal hypertrophy not classic for HOCM morphology with no delayed gadolium uptake to suggest myofibrillar disarray, + SAM with small LVOT gradient in regard to physiology and mild MR, mild LAE, no delayed gad uptake.  Patient has longstanding history of atypical chest discomfort.  Poor historian.  Last stress test August 2019 was low risk.  Seen by me 02/03/2019 for chest pain and shortness of breath while having palpitations. She did not had a single episode of palpitation without chest pain.  Worsening symptoms.  Unsure which medications she was taking.  Advised to bring all medication during next office visit. Pending result of monitor (no significant tachy- bradyarrhythmias on personal review).   Here today for follow up. She reports continued to have wage non specific chest discomfort and palpitations. Takes lasix few times/month. Sleeps on recliner chronically without any difficulty. No dizziness or syncope. Try to eat low sodium diet.    Past Medical History:  Diagnosis Date  . CAD in native artery    a. 04/2016 NSTEMI with cath showing 25% MCx, 75% DX1 lesion and moderate LVD 35-45% out of proportion to CAD and in a pattern of Takotsubo CM (no clear stressor)  . Dyslipidemia   . GERD (gastroesophageal reflux disease)   . Hypercholesteremia   . Hypertension   . Hypertensive heart disease    initially she was thought to have a HOCM variant by cath with increased LVOT gradient but echo showed only a 10mmHg gradient across the LVOT and SAM and cardiac MRI did not show any late gad enhancement and only a 5mmHg LVOT gradient with 15mm BSH.  There was some mild SAM and mild MR.  EF normal at 76%.    [name]. LVH (left ventricular hypertrophy)    a. cMRI 2018 - showing moderate Basal septal hypertrophy not classic for HOCM morphology with no delayed gadolium uptake to suggest myofibrillar disarray, + SAM with small LVOT gradient in regard to physiology and mild MR.  . Mild aortic stenosis   . NSTEMI (non-ST elevated myocardial infarction) (HCC) 04/2016   secondary to stress CM - Takotsubo CM  . PAC (premature atrial contraction)    Event monitors 10/2013 and 08/2014 showed NSR, ST, PACs, PVCs.   [name]. 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 Posterior communicating artery aneurysm    followed by Dr Venetia MaxonStern  . PVC's (premature ventricular contractions)    Event monitors 10/2013 and 08/2014 showed NSR, ST, PACs,  PVCs.   . 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 ABDOMINAL HYSTERECTOMY    . 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 CARDIAC CATHETERIZATION N/A 04/21/2016   Procedure: Left Heart Cath and Coronary Angiography;  Surgeon: Corky CraftsJayadeep S Varanasi, MD;  [facility];  Service: Cardiovascular;  Laterality: N/A;  . IR GENERIC HISTORICAL  02/13/2016   IR RADIOLOGY  PERIPHERAL GUIDED IV START 02/13/2016 Berdine DanceMichael Shick, MD [facility]-INTER[facility] RAD  . IR GENERIC HISTORICAL  02/13/2016   IR US GUIDE VASC ACCESS LEFT 02/13/2016 Berdine DanceMichael Shick, MD [facility]    Current Medications: Prior to Admission medications   Medication Sig Start Date End Date Taking? Authorizing Provider  acetaminophen (TYLENOL) 325 MG tablet Take 2 tablets (650 mg total) by mouth every 4 (four) hours as needed for headache or mild pain. 04/23/16   Abelino DerrickKilroy, Luke K, PA-C  aspirin EC 81 MG EC tablet Take 1 tablet (81 mg total) by mouth daily. 04/23/16   Abelino DerrickKilroy, Luke K, PA-C  atorvastatin (LIPITOR) 40 MG tablet Take 40 mg by mouth at bedtime.    [provider]  Calcium Carbonate-Vitamin D (CALCIUM-D PO) Take 1 tablet by mouth daily.    [provider]  carvedilol (COREG) 12.5 MG tablet Take 1 tablet (12.5 mg total) by mouth 2 (two) times daily. 02/02/19 05/03/19  Dunn, Tacey Ruizayna N, PA-C  famotidine (PEPCID) 20 MG tablet Take 20 mg by mouth daily.    [provider]  fluticasone (FLONASE) 50 MCG/ACT nasal spray Place 1 spray into both nostrils daily as needed (congestion).  05/10/17   [provider]  fluticasone (FLOVENT HFA) 110 MCG/ACT inhaler Inhale 1 puff into the lungs as needed.  05/10/17   [provider]  furosemide (LASIX) 20 MG tablet TAKE 1 TABLET (20 MG TOTAL) BY MOUTH DAILY AS NEEDED FOR FLUID OR EDEMA. 01/21/18   Dunn, Tacey Ruizayna N, PA-C  gabapentin (NEURONTIN) 300 MG capsule Take 300 mg by mouth 3 (three) times daily.    [provider]  hydrochlorothiazide (HYDRODIURIL) 12.5 MG tablet Take 12.5 mg by mouth daily.  09/08/16   [provider]  Multiple Vitamin (MULTIVITAMIN WITH MINERALS) TABS tablet Take 1 tablet by mouth daily.    [provider]  Omega-3 Fatty Acids (FISH OIL PO) Take 1 capsule by mouth daily at 2 PM.     [provider]    Allergies:   Ace inhibitors, Codeine, and Penicillins   Social History    Socioeconomic History  . Marital status: Single    Spouse name: Not on file  . Number of children: Not on file  . Years of education: Not on file  . Highest education level: Not on file  Occupational History  . Not on file  Social Needs  . Financial resource strain: Not on file  . Food insecurity    Worry: Not on file    Inability: Not on file  . Transportation needs    Medical: Not on file    Non-medical: Not on file  Tobacco Use  . Smoking status: Former Smoker    Quit date: 07/13/2004    Years since quitting: 14.7  . Smokeless tobacco: Never Used  Substance and Sexual Activity  . Alcohol use: Yes    Comment: ocassionally   . Drug use: No  . Sexual activity: Not on file  Lifestyle  . Physical activity    Days per week: Not on file    Minutes per session: Not on file  . Stress: Not on file  Relationships  . Social Musician on phone: Not on file    Gets together: Not on file    Attends religious service: Not on file    Active member of club or organization: Not on file    Attends meetings of clubs or organizations: Not on file    Relationship status: Not on file  Other Topics Concern  . Not on file  Social History Narrative  . Not on file     Family History:  The patient's family history includes Heart attack in her brother, mother, and sister; Heart disease in her brother, mother, and sister.   ROS:   Please see the history of present illness.    ROS All other systems reviewed and are negative.   PHYSICAL EXAM:   VS:  BP (!) 142/58   Pulse (!) 55   Ht 5\' 2"  (1.575 m)   Wt 142 lb 12.8 oz (64.8 kg)   SpO2 95%   BMI 26.12 kg/m    GEN: Well nourished, well developed, in no acute distress  HEENT: normal  Neck: no JVD, carotid bruits, or masses Cardiac: RRR; no murmurs, rubs, or gallops,no edema  Respiratory:  clear to auscultation bilaterally, normal work of breathing GI: soft, nontender, nondistended, + BS MS: s/p R leg prosthesis Skin: warm  and dry, no rash Neuro:  Alert and Oriented x 3,  Psych: euthymic mood, full affect  Wt Readings from Last 3 Encounters:  03/27/19 142 lb 12.8 oz (64.8 kg)  02/03/19 145 lb 3.2 oz (65.9 kg)  02/02/19 143 lb (64.9 kg)      Studies/Labs Reviewed:   EKG:  EKG is not ordered today.   Recent Labs: 02/03/2019: BUN 11; Creatinine, Ser 0.89; Magnesium 2.1; Potassium 4.0; Sodium 142   Lipid Panel    Component Value Date/Time   CHOL 176 04/22/2016 0017   TRIG 260 (H) 04/22/2016 0017   HDL 45 04/22/2016 0017   CHOLHDL 3.9 04/22/2016 0017   VLDL 52 (H) 04/22/2016 0017   LDLCALC 79 04/22/2016 0017    Additional studies/ records that were reviewed today include:   As summarized above    ASSESSMENT & PLAN:    1. Chest discomfort/palpitations - Wage symptoms which improved after addition of coreg. She is poor historian. Pending final result of monitor. She did not bring her medications during visit despite recommendation. However reports taking HCTZ daily and PRN lasix.  - no change in therapy. She will call if worsening of symptoms.   2. Hypertensive heart disease - BP improved after addition of coreg.   3.  Mild aortic stenosis -Last echo 12/2017.   No significant murmur heard. Will follow.   4. CAD - Continue ASA, statin and BB.   Medication Adjustments/Labs and Tests Ordered: Current medicines are reviewed at length with the patient today.  Concerns regarding medicines are outlined above.  Medication changes, Labs and Tests ordered today are listed in the Patient Instructions below. Patient Instructions  Medication Instructions:  Your physician recommends that you continue on your current medications as directed. Please refer to the Current Medication list given to you today.  If you need a refill on your cardiac medications before your next appointment, please call your pharmacy.   Lab work: None ordered  If you have labs (  blood work) drawn today and your tests are  completely normal, you will receive your results only by: [name] MyChart Message (if you have MyChart) OR . A paper copy in the mail If you have any lab test that is abnormal or we need to change your treatment, we will call you to review the results.  Testing/Procedures: None ordered  Follow-Up: At [facility], you and your health needs are our priority.  As part of our continuing mission to provide you with exceptional heart care, we have created designated Provider Care Teams.  These Care Teams include your primary Cardiologist (physician) and Advanced Practice Providers (APPs -  Physician Assistants and Nurse Practitioners) who all work together to provide you with the care you need, when you need it. You will need a follow up appointment in 4 months.  Please call our office 2 months in advance to schedule this appointment.  You may see Fransico Him, MD or one of the following Advanced Practice Providers on your designated Care Team:   [place], PA-C Melina Copa, PA-C . Ermalinda Barrios, PA-C  Any Other Special Instructions Will Be Listed Below (If Applicable).       Jarrett Soho, [place]  03/27/2019 9:28 AM    [facility] [address], [place], [place]  [address] Phone: [phone]; Fax: [phone]

## 2019-03-27 ENCOUNTER — Other Ambulatory Visit: Payer: Self-pay

## 2019-03-27 ENCOUNTER — Ambulatory Visit (INDEPENDENT_AMBULATORY_CARE_PROVIDER_SITE_OTHER): Payer: Medicare Other | Admitting: Physician Assistant

## 2019-03-27 ENCOUNTER — Encounter: Payer: Self-pay | Admitting: Physician Assistant

## 2019-03-27 VITALS — BP 142/58 | HR 55 | Ht 62.0 in | Wt 142.8 lb

## 2019-03-27 DIAGNOSIS — I350 Nonrheumatic aortic (valve) stenosis: Secondary | ICD-10-CM

## 2019-03-27 DIAGNOSIS — I119 Hypertensive heart disease without heart failure: Secondary | ICD-10-CM | POA: Diagnosis not present

## 2019-03-27 DIAGNOSIS — E785 Hyperlipidemia, unspecified: Secondary | ICD-10-CM

## 2019-03-27 DIAGNOSIS — I5181 Takotsubo syndrome: Secondary | ICD-10-CM

## 2019-03-27 DIAGNOSIS — I2510 Atherosclerotic heart disease of native coronary artery without angina pectoris: Secondary | ICD-10-CM | POA: Diagnosis not present

## 2019-03-27 NOTE — Patient Instructions (Addendum)
Medication Instructions:  Your physician recommends that you continue on your current medications as directed. Please refer to the Current Medication list given to you today.  If you need a refill on your cardiac medications before your next appointment, please call your pharmacy.   Lab work: None ordered  If you have labs (blood work) drawn today and your tests are completely normal, you will receive your results only by: [name] MyChart Message (if you have MyChart) OR . A paper copy in the mail If you have any lab test that is abnormal or we need to change your treatment, we will call you to review the results.  Testing/Procedures: None ordered  Follow-Up: At [facility], you and your health needs are our priority.  As part of our continuing mission to provide you with exceptional heart care, we have created designated Provider Care Teams.  These Care Teams include your primary Cardiologist (physician) and Advanced Practice Providers (APPs -  Physician Assistants and Nurse Practitioners) who all work together to provide you with the care you need, when you need it. You will need a follow up appointment in 4 months.  Please call our office 2 months in advance to schedule this appointment.  You may see Fransico Him, MD or one of the following Advanced Practice Providers on your designated Care Team:   [place], PA-C Melina Copa, PA-C . Ermalinda Barrios, PA-C  Any Other Special Instructions Will Be Listed Below (If Applicable).

## 2019-03-28 ENCOUNTER — Telehealth: Payer: Self-pay

## 2019-03-28 NOTE — Telephone Encounter (Signed)
Please have her cut out all caffeine and chocolate as well as any ETOH and call in a week to see if palpitations have settled down

## 2019-03-28 NOTE — Telephone Encounter (Signed)
-----   Message from Sueanne Margarita, MD sent at 03/28/2019  3:25 PM EDT ----- Heart monitor showed occasional PVCs.  Please find out if she is stil having palpitations

## 2019-03-28 NOTE — Telephone Encounter (Signed)
Notes recorded by Frederik Schmidt, RN on 03/28/2019 at 3:38 PM EDT  The patient has been notified of the result and verbalized understanding. All questions (if any) were answered.  Frederik Schmidt, RN 03/28/2019 3:38 PM   The patient is still having palpitations and admits to drinking a lot of sweet tea and eating chocolate.

## 2019-03-29 NOTE — Telephone Encounter (Signed)
I spoke to the patient with Dr Theodosia Blender recommendation.  She verbalized understanding and will call us in 1 week.

## 2019-04-03 ENCOUNTER — Other Ambulatory Visit: Payer: Self-pay | Admitting: Family Medicine

## 2019-04-03 DIAGNOSIS — Z1231 Encounter for screening mammogram for malignant neoplasm of breast: Secondary | ICD-10-CM

## 2019-04-04 NOTE — Telephone Encounter (Signed)
Unidentified person (did not identify name or where they are calling from) is calling to say that we were supposed to resubmit information for her recent pap smear to [employer] [facility].  It was originally sent as a pap with HPV routine and it is not covered.  Patient is getting a bill for $156.40 and she was told that this would be resubmitted within 10 days.      Billing number [id number]

## 2019-04-07 ENCOUNTER — Other Ambulatory Visit: Payer: Self-pay | Admitting: Physician Assistant

## 2019-05-18 ENCOUNTER — Ambulatory Visit
Admission: RE | Admit: 2019-05-18 | Discharge: 2019-05-18 | Disposition: A | Discharge status: Home / Self Care | Payer: Medicare Other | Source: Ambulatory Visit | Attending: Family Medicine | Admitting: Family Medicine

## 2019-05-18 ENCOUNTER — Other Ambulatory Visit: Payer: Self-pay

## 2019-05-18 DIAGNOSIS — Z1231 Encounter for screening mammogram for malignant neoplasm of breast: Secondary | ICD-10-CM

## 2019-06-26 ENCOUNTER — Other Ambulatory Visit: Payer: Self-pay

## 2019-06-26 ENCOUNTER — Telehealth: Payer: Medicare Other | Admitting: Cardiology

## 2019-06-26 ENCOUNTER — Telehealth: Payer: Self-pay | Admitting: *Deleted

## 2019-06-26 NOTE — Progress Notes (Deleted)
Virtual Visit via Video Note   This visit type was conducted due to national recommendations for restrictions regarding the COVID-19 Pandemic (e.g. social distancing) in an effort to limit this patient's exposure and mitigate transmission in our community.  Due to her co-morbid illnesses, this patient is at least at moderate risk for complications without adequate follow up.  This format is felt to be most appropriate for this patient at this time.  All issues noted in this document were discussed and addressed.  A limited physical exam was performed with this format.  Please refer to the patient's chart for her consent to telehealth for [facility].  Evaluation Performed:  Follow-up visit  This visit type was conducted due to national recommendations for restrictions regarding the COVID-19 Pandemic (e.g. social distancing).  This format is felt to be most appropriate for this patient at this time.  All issues noted in this document were discussed and addressed.  No physical exam was performed (except for noted visual exam findings with Video Visits).  Please refer to the patient's chart (MyChart message for video visits and phone note for telephone visits) for the patient's consent to telehealth for [facility].  Date:  06/26/2019   ID:  [name] Schmierer, DOB 1940/11/21, MRN [id number]  Patient Location:  Home  [place]   [place]  PCP:  Maurice SmallGriffin, [name], MD  Cardiologist:  Armanda Magicraci , MD  Electrophysiologist:  None   Chief Complaint:  CAD, HTN, HLD  History of Present Illness:    [name] Kirkwood is a 78 y.o. female who presents via audio/video conferencing for a telehealth visit today.    [name] Metsker is an 78 y.o. female with a h/o CAD 2nd toNSTEMI in 05/2016 felt due to Takotsubo CM,hypertensive heart diseae/atypical HOCM, mild aortic stenosis, posterior noncommunicating artery aneurysm (followed by Dr. Ethelda ChickStern),PAD (popliteal bypass s/p RBKA converted to AKA  11/2006), HTNand PVCs .   Cardiac cath10/10/17 revealed 25 mCx, 75% ostial D1, LVEF 35-45% in pattern of Takotsubo CM, and a 55 mmHg LVOT gradient felt due to subvalvular stenosis.Echo 05/2016 showed EF 60-65%, grade 1 DD, moderate focal hypertrophy of the basilar septum with a narrowed LV outflow tract and mild SAM. There was minimal LVOT gradient of 10mmHg, felt possibly HOCM variant. She underwent cardiac MRI 07/2016 showing moderate basal septal hypertrophy not classic for HOCM morphology with no delayed gadolium uptake to suggest myofibrillar disarray, + SAM with small LVOT gradient in regard to physiology and mild MR, mild LAE, no delayed gad uptake. Patient has longstanding history of atypical chest discomfort which seemed to help with addition of BB at prior OV with PA.    She is here today for followup and is doing well.  She denies any chest pain or pressure, SOB, DOE, PND, orthopnea, LE edema, dizziness, palpitations or syncope. She is compliant with her meds and is tolerating meds with no SE.    The patient does not have symptoms concerning for COVID-19 infection (fever, chills, cough, or new shortness of breath).    Prior CV studies:   The following studies were reviewed today:  none  Past Medical History:  Diagnosis Date  . CAD in native artery    a. 04/2016 NSTEMI with cath showing 25% MCx, 75% DX1 lesion and moderate LVD 35-45% out of proportion to CAD and in a pattern of Takotsubo CM (no clear stressor)  . Dyslipidemia   . GERD (gastroesophageal reflux disease)   . Hypercholesteremia   .  Hypertension   . Hypertensive heart disease    initially she was thought to have a HOCM variant by cath with increased LVOT gradient but echo showed only a gradient across the LVOT and SAM and cardiac MRI did not show any late gad enhancement and only a LVOT gradient with 59mm BSH.  There was some mild SAM and mild MR.  EF normal at 76%.    [name] LVH (left ventricular hypertrophy)     a. cMRI 2018 - showing moderate Basal septal hypertrophy not classic for HOCM morphology with no delayed gadolium uptake to suggest myofibrillar disarray, + SAM with small LVOT gradient in regard to physiology and mild MR.  . Mild aortic stenosis   . NSTEMI (non-ST elevated myocardial infarction) (HCC) 04/2016   secondary to stress CM - Takotsubo CM  . PAC (premature atrial contraction)    Event monitors 10/2013 and 08/2014 showed NSR, ST, PACs, PVCs.   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 Posterior communicating artery aneurysm    followed by Dr Venetia Maxon  . PVC's (premature ventricular contractions)    Event monitors 10/2013 and 08/2014 showed NSR, ST, PACs, PVCs.   . 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 ABDOMINAL HYSTERECTOMY    . 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 CARDIAC CATHETERIZATION N/A 04/21/2016   Procedure: Left Heart Cath and Coronary Angiography;  Surgeon: Corky Crafts, MD;  Location: [facility] INVASIVE CV LAB;  Service: Cardiovascular;  Laterality: N/A;  . IR GENERIC HISTORICAL  02/13/2016   IR RADIOLOGY PERIPHERAL GUIDED IV START 02/13/2016 Berdine Dance, MD [facility]INTER[facility]  . IR GENERIC HISTORICAL  02/13/2016   IR US GUIDE VASC ACCESS LEFT 02/13/2016 Berdine Dance, MD [facility]     No outpatient medications have been marked as taking for the 06/26/19 encounter (Appointment) with Quintella Reichert, MD.     Allergies:   Ace inhibitors, Codeine, and Penicillins   Social History   Tobacco Use  . Smoking status: Former Smoker    Quit date: 07/13/2004    Years since quitting: 14.9  . Smokeless tobacco: Never Used  Substance Use Topics  . Alcohol use: Yes    Comment: ocassionally   . Drug use: No     Family Hx: The patient's family history includes Heart attack in her brother, mother, and sister; Heart  disease in her brother, mother, and sister.  ROS:   Please see the history of present illness.     All other systems reviewed and are negative.   Labs/Other Tests and Data Reviewed:    Recent Labs: 02/03/2019: BUN 11; Creatinine, Ser 0.89; Magnesium 2.1; Potassium 4.0; Sodium 142   Recent Lipid Panel Lab Results  Component Value Date/Time   CHOL 176 04/22/2016 12:17 AM   TRIG 260 (H) 04/22/2016 12:17 AM   HDL 45 04/22/2016 12:17 AM   CHOLHDL 3.9 04/22/2016 12:17 AM   LDLCALC 79 04/22/2016 12:17 AM    Wt Readings from Last 3 Encounters:  03/27/19 142 lb 12.8 oz (64.8 kg)  02/03/19 145 lb 3.2 oz (65.9 kg)  02/02/19 143 lb (64.9 kg)     Objective:    Vital Signs:  There were no vitals taken for this visit.   CONSTITUTIONAL:  Well nourished, well developed female in no  acute distress.  EYES: anicteric MOUTH: oral mucosa is pink RESPIRATORY: Normal respiratory effort, symmetric expansion CARDIOVASCULAR: No peripheral edema SKIN: No rash, lesions or ulcers MUSCULOSKELETAL: no digital cyanosis NEURO: Cranial Nerves II-XII grossly intact, moves all extremities PSYCH: Intact judgement and insight.  A&O x 3, Mood/affect appropriate   ASSESSMENT & PLAN:    1.  ASCAD -s/p  NSTEMI with cath showing  75% DX1 lesion and moderate LVD 35-45% out of proportion to CAD and in a pattern of Takotsubo CM.  She is on medical management for her diagonal disease -denies any CP -continue carvedilol 12.5mg  BID, statin and ASA 81mg  daily  2.  HLD -LDL goal < 70 -continue atorvastatin 40mg  daily  3. HTN -BP controlled -continue Carvedilol 12.5mg  BID, HCTZ 12.5 daily   4.  Aortic stenosis -mild by echo 12/2017  COVID-19 Education: The signs and symptoms of COVID-19 were discussed with the patient and how to seek care for testing (follow up with PCP or arrange E-visit).  The importance of social distancing was discussed today.  Patient Risk:   After full review of this patient's  clinical status, I feel that they are at least moderate risk at this time.  Time:   Today, I have spent 20 minutes directly with the patient on telemedicine discussing medical problems including CAD< HTN, HLD< AS.  We also reviewed the symptoms of COVID 19 and the ways to protect against contracting the virus with telehealth technology.  I spent an additional 5 minutes reviewing patient's chart including 2D echo, labs.  Medication Adjustments/Labs and Tests Ordered: Current medicines are reviewed at length with the patient today.  Concerns regarding medicines are outlined above.  Tests Ordered: No orders of the defined types were placed in this encounter.  Medication Changes: No orders of the defined types were placed in this encounter.   Disposition:  Follow up in 1 year(s)  Signed, Fransico Him, MD  06/26/2019 7:16 AM    [facility]

## 2019-06-26 NOTE — Telephone Encounter (Signed)

## 2019-07-12 ENCOUNTER — Ambulatory Visit: Admit: 2019-07-12 | Payer: MEDICARE | Attending: Internal Medicine | Primary: Internal Medicine

## 2019-07-12 ENCOUNTER — Ambulatory Visit: Attending: Internal Medicine | Primary: Internal Medicine

## 2019-07-12 DIAGNOSIS — E782 Mixed hyperlipidemia: Secondary | ICD-10-CM

## 2019-07-12 DIAGNOSIS — Z0000 Encounter for general adult medical examination without abnormal findings: Secondary | ICD-10-CM

## 2019-07-12 MED ORDER — CHLORTHALIDONE 25 MG TAB
25 mg | ORAL_TABLET | Freq: Every day | ORAL | 3 refills | Status: DC
Start: 2019-07-12 — End: 2020-07-09

## 2019-07-12 MED ORDER — TELMISARTAN 80 MG TAB
80 mg | ORAL_TABLET | Freq: Every day | ORAL | 3 refills | Status: DC
Start: 2019-07-12 — End: 2020-07-09

## 2019-07-12 NOTE — Progress Notes (Signed)
This is the Subsequent Medicare Annual Wellness Exam, performed 12 months or more after the Initial AWV or the last Subsequent AWV    I have reviewed the patient's medical history in detail and updated the computerized patient record.      Valerie Barry is accompanied by her daughter, [name].  She is in relatively good spirits today.    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  She tolerated complete abdominal hysterectomy with Dr. Eulas Post February 13, 2019 and then underwent chemotherapy and radiation therapy.  Chemotherapy cause loss of hair and significant nausea and vomiting but she got through it.  She has healed well and says that they believe that she is largely cured.  Recent CT scan showed no evidence of metastatic disease.    Weight loss secondary to chemotherapy.  Says that it is stabilized and she is not trying to gain the back.  Wt Readings from Last 3 Encounters:   07/12/19 166 lb 6.4 oz (75.5 kg)   02/01/19 181 lb (82.1 kg)   01/25/19 181 lb (82.1 kg)     Hypertension  Hypertension ROS: taking medications as instructed, no medication side effects noted, no TIA's, no chest pain on exertion, no dyspnea on exertion, no swelling of ankles     reports that she has never smoked. She has never used smokeless tobacco.    reports no history of alcohol use.   BP Readings from Last 2 Encounters:   07/12/19 134/77   02/01/19 137/51       History     Past Medical History:   Diagnosis Date   ??? Essential hypertension    ??? Hematuria 01/2010    Dr. York Cerise, due to catheter, blood clot. [place] nl 2012   ??? History of endometrial cancer 02/01/2019    Endometrial Adenocarcinoma favor grade 2 endometrioid   ??? 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  Dr. Stacie Acres   ??? Mixed hyperlipidemia    ??? Nausea & vomiting     PONV   ??? Osteopenia 01/2016   ??? Pap smear for cervical cancer screening 12/26/2018    Negative, HPV Negative   ??? Spider varicose veins     ??? Vitamin D deficiency 02/2012      Past Surgical History:   Procedure Laterality Date   ??? COLONOSCOPY  01/2010    normal   ??? HX HYSTERECTOMY  02/2019   ??? HX KNEE ARTHROSCOPY      x4   ??? 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 Stacie Acres   ??? HX OTHER SURGICAL      Biopsy of uterus.   ??? HX OTHER SURGICAL  03/2019    D&C uterus     Current Outpatient Medications   Medication Sig Dispense Refill   ??? ibuprofen (MOTRIN) 200 mg tablet Take 2 Tabs by mouth every six (6) hours as needed for Pain. 20 Tab 0   ??? telmisartan (MICARDIS) 80 mg tablet Take 1 Tab by mouth daily. 90 Tab 1   ??? chlorthalidone (HYGROTEN) 25 mg tablet Take 1 Tab by mouth daily. For blood pressure 90 Tab 1   ??? Cholecalciferol, Vitamin D3, (VITAMIN D3) 2,000 unit cap capsule Take 2,000 Units by mouth daily. 30 Cap 5   ??? multivit-min-iron-FA-lutein (CENTRUM SILVER WOMEN) 8 mg iron-400 mcg-300 mcg tab Take 1 daily 30 Tab 11     Allergies   Allergen  Reactions   ??? Codeine Nausea Only   ??? Lisinopril Other (comments)     Facial flushing   ??? Tylenol-Codeine #2 Nausea and Vomiting     Family History   Problem Relation Age of Onset   ??? Hypertension Mother    ??? Kidney Disease Mother    ??? Heart Disease Mother         afib   ??? Hypertension Brother    ??? Heart Disease Brother    ??? Lung Disease Father         emphysema   ??? Lung Disease Brother    ??? Diabetes Neg Hx    ??? Cancer Neg Hx      Social History     Tobacco Use   ??? Smoking status: Never Smoker   ??? Smokeless tobacco: Never Used   Substance Use Topics   ??? Alcohol use: No     Patient Active Problem List   Diagnosis Code   ??? Knee pain M25.569   ??? Mixed hyperlipidemia E78.2   ??? HTN (hypertension) I10   ??? Vitamin D deficiency E55.9   ??? Spider varicose veins I86.8   ??? Osteopenia M85.80       Depression Risk Factor Screening:     3 most recent PHQ Screens 07/12/2019   PHQ Not Done -   Little interest or pleasure in doing things Not at all   Feeling down, depressed, irritable, or hopeless Not at all    Total Score PHQ 2 0     Alcohol Risk Factor Screening:   You do not drink alcohol or very rarely.    Functional Ability and Level of Safety:   Hearing Loss  Hearing is good.    Activities of Daily Living  The home contains: no safety equipment.  Patient does total self care    Fall Risk  Fall Risk Assessment, last 12 mths 07/12/2019   Able to walk? Yes   Fall in past 12 months? 0   Do you feel unsteady? 0   Are you worried about falling 0       Abuse Screen  Patient is not abused    Cognitive Screening   Evaluation of Cognitive Function:  Has your family/caregiver stated any concerns about your memory: no  Normal    Patient Care Team   Patient Care Team:  [place], Balinda Quails, MD as PCP - General (Internal Medicine)  Shanda Howells, Balinda Quails, MD as PCP - [facility] Empaneled Provider  Eustaquio Boyden, MD (Urology)    Assessment/Plan   Education and counseling provided:  Are appropriate based on today's review and evaluation    Diagnoses and all orders for this visit:    1. Medicare annual wellness visit, subsequent  Recommend follow-up with ophthalmology for screening glaucoma exam.    2. Screening for depression  -     DEPRESSION SCREEN ANNUAL    3. Essential hypertension  based on my history, the overall control of this problem borderline controlled.    Change to chlorthalidone and micardis  -     CBC W/O DIFF  -     METABOLIC PANEL, COMPREHENSIVE    5. Mixed hyperlipidemia  Mild, borderline controlled  The patient is asked to make an attempt to improve diet and exercise patterns to aid in medical management of this problem.  -     LIPID PANEL  -     METABOLIC PANEL, COMPREHENSIVE    6. Vitamin D deficiency  She  is taking a vitamin D supplement.  -     VITAMIN D, 25 HYDROXY    Discussed the patient's BMI with her.  The BMI follow up plan is as follows:     dietary management education, guidance, and counseling  encourage exercise  monitor weight  prescribed dietary intake    An After Visit Summary was printed and given to the patient.     Health Maintenance Due   Topic Date Due   ??? GLAUCOMA SCREENING Q2Y  01/29/2019   ??? Medicare Yearly Exam  05/21/2019       Discussed the patient's BMI with her.  The BMI follow up plan is as follows:     dietary management education, guidance, and counseling  encourage exercise  monitor weight  prescribed dietary intake    An After Visit Summary was printed and given to the patient.

## 2019-07-12 NOTE — Progress Notes (Signed)
This is the Subsequent Medicare Annual Wellness Exam, performed 12 months or more after the Initial AWV or the last Subsequent AWV    I have reviewed the patient's medical history in detail and updated the computerized patient record.      Valerie Barry is accompanied by her daughter, [name].  She is in relatively good spirits today.    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  She tolerated complete abdominal hysterectomy with Dr. Eulas Post February 13, 2019 and then underwent chemotherapy and radiation therapy.  Chemotherapy cause loss of hair and significant nausea and vomiting but she got through it.  She has healed well and says that they believe that she is largely cured.  Recent CT scan showed no evidence of metastatic disease.    Weight loss secondary to chemotherapy.  Says that it is stabilized and she is not trying to gain the back.  Wt Readings from Last 3 Encounters:   07/12/19 166 lb 6.4 oz (75.5 kg)   02/01/19 181 lb (82.1 kg)   01/25/19 181 lb (82.1 kg)     Hypertension  Hypertension ROS: taking medications as instructed, no medication side effects noted, no TIA's, no chest pain on exertion, no dyspnea on exertion, no swelling of ankles     reports that she has never smoked. She has never used smokeless tobacco.    reports no history of alcohol use.   BP Readings from Last 2 Encounters:   07/12/19 134/77   02/01/19 137/51       History     Past Medical History:   Diagnosis Date   ??? Essential hypertension    ??? Hematuria 01/2010    Dr. York Cerise, due to catheter, blood clot. [place] nl 2012   ??? History of endometrial cancer 02/01/2019    Endometrial Adenocarcinoma favor grade 2 endometrioid   ??? 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  Dr. Stacie Acres   ??? Mixed hyperlipidemia    ??? Nausea & vomiting     PONV   ??? Osteopenia 01/2016   ??? Pap smear for cervical cancer screening 12/26/2018    Negative, HPV Negative   ??? Spider varicose veins    ??? Vitamin D deficiency  02/2012      Past Surgical History:   Procedure Laterality Date   ??? COLONOSCOPY  01/2010    normal   ??? HX HYSTERECTOMY  02/2019   ??? HX KNEE ARTHROSCOPY      x4   ??? 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 Stacie Acres   ??? HX OTHER SURGICAL      Biopsy of uterus.   ??? HX OTHER SURGICAL  03/2019    D&C uterus     Current Outpatient Medications   Medication Sig Dispense Refill   ??? ibuprofen (MOTRIN) 200 mg tablet Take 2 Tabs by mouth every six (6) hours as needed for Pain. 20 Tab 0   ??? telmisartan (MICARDIS) 80 mg tablet Take 1 Tab by mouth daily. 90 Tab 1   ??? chlorthalidone (HYGROTEN) 25 mg tablet Take 1 Tab by mouth daily. For blood pressure 90 Tab 1   ??? Cholecalciferol, Vitamin D3, (VITAMIN D3) 2,000 unit cap capsule Take 2,000 Units by mouth daily. 30 Cap 5   ??? multivit-min-iron-FA-lutein (CENTRUM SILVER WOMEN) 8 mg iron-400 mcg-300 mcg tab Take 1 daily 30 Tab 11     Allergies   Allergen  Reactions   ??? Codeine Nausea Only   ??? Lisinopril Other (comments)     Facial flushing   ??? Tylenol-Codeine #2 Nausea and Vomiting     Family History   Problem Relation Age of Onset   ??? Hypertension Mother    ??? Kidney Disease Mother    ??? Heart Disease Mother         afib   ??? Hypertension Brother    ??? Heart Disease Brother    ??? Lung Disease Father         emphysema   ??? Lung Disease Brother    ??? Diabetes Neg Hx    ??? Cancer Neg Hx      Social History     Tobacco Use   ??? Smoking status: Never Smoker   ??? Smokeless tobacco: Never Used   Substance Use Topics   ??? Alcohol use: No     Patient Active Problem List   Diagnosis Code   ??? Knee pain M25.569   ??? Mixed hyperlipidemia E78.2   ??? HTN (hypertension) I10   ??? Vitamin D deficiency E55.9   ??? Spider varicose veins I86.8   ??? Osteopenia M85.80       Depression Risk Factor Screening:     3 most recent PHQ Screens 07/12/2019   PHQ Not Done -   Little interest or pleasure in doing things Not at all   Feeling down, depressed, irritable, or hopeless Not at all   Total Score PHQ 2 0     Alcohol  Risk Factor Screening:   You do not drink alcohol or very rarely.    Functional Ability and Level of Safety:   Hearing Loss  Hearing is good.    Activities of Daily Living  The home contains: no safety equipment.  Patient does total self care    Fall Risk  Fall Risk Assessment, last 12 mths 07/12/2019   Able to walk? Yes   Fall in past 12 months? 0   Do you feel unsteady? 0   Are you worried about falling 0       Abuse Screen  Patient is not abused    Cognitive Screening   Evaluation of Cognitive Function:  Has your family/caregiver stated any concerns about your memory: no  Normal    Patient Care Team   Patient Care Team:  [place], Balinda Quails, MD as PCP - General (Internal Medicine)  Shanda Howells, Balinda Quails, MD as PCP - [facility] Empaneled Provider  Eustaquio Boyden, MD (Urology)    Assessment/Plan   Education and counseling provided:  Are appropriate based on today's review and evaluation    Diagnoses and all orders for this visit:    1. Medicare annual wellness visit, subsequent  Recommend follow-up with ophthalmology for screening glaucoma exam.    2. Screening for depression  -     DEPRESSION SCREEN ANNUAL    3. Essential hypertension  based on my history, the overall control of this problem borderline controlled.    Change to chlorthalidone and micardis  -     CBC W/O DIFF  -     METABOLIC PANEL, COMPREHENSIVE    5. Mixed hyperlipidemia  Mild, borderline controlled  The patient is asked to make an attempt to improve diet and exercise patterns to aid in medical management of this problem.  -     LIPID PANEL  -     METABOLIC PANEL, COMPREHENSIVE    6. Vitamin D deficiency  She  is taking a vitamin D supplement.  -     VITAMIN D, 25 HYDROXY    Discussed the patient's BMI with her.  The BMI follow up plan is as follows:     dietary management education, guidance, and counseling  encourage exercise  monitor weight  prescribed dietary intake    An After Visit Summary was printed and given to the patient.    Health Maintenance Due   Topic  Date Due   ??? GLAUCOMA SCREENING Q2Y  01/29/2019   ??? Medicare Yearly Exam  05/21/2019       Discussed the patient's BMI with her.  The BMI follow up plan is as follows:     dietary management education, guidance, and counseling  encourage exercise  monitor weight  prescribed dietary intake    An After Visit Summary was printed and given to the patient.

## 2019-07-13 LAB — COMPREHENSIVE METABOLIC PANEL
ALT: 14 U/L (ref 12–78)
AST: 14 U/L — ABNORMAL LOW (ref 15–37)
Albumin/Globulin Ratio: 1.3 (ref 1.1–2.2)
Albumin: 3.8 g/dL (ref 3.5–5.0)
Alkaline Phosphatase: 100 U/L (ref 45–117)
Anion Gap: 7 mmol/L (ref 5–15)
BUN: 24 MG/DL — ABNORMAL HIGH (ref 6–20)
Bun/Cre Ratio: 26 — ABNORMAL HIGH (ref 12–20)
CO2: 29 mmol/L (ref 21–32)
Calcium: 9.6 MG/DL (ref 8.5–10.1)
Chloride: 103 mmol/L (ref 97–108)
Creatinine: 0.92 MG/DL (ref 0.55–1.02)
EGFR IF NonAfrican American: 59 mL/min/{1.73_m2} — ABNORMAL LOW (ref >60)
GFR African American: >60 mL/min/{1.73_m2} (ref >60)
Globulin: 3 g/dL (ref 2.0–4.0)
Glucose: 101 mg/dL — ABNORMAL HIGH (ref 65–100)
Potassium: 3.9 mmol/L (ref 3.5–5.1)
Sodium: 139 mmol/L (ref 136–145)
Total Bilirubin: 0.3 MG/DL (ref 0.2–1.0)
Total Protein: 6.8 g/dL (ref 6.4–8.2)

## 2019-07-13 LAB — CBC
Hematocrit: 36.9 % (ref 35.0–47.0)
Hemoglobin: 11.8 g/dL (ref 11.5–16.0)
MCH: 31.2 PG (ref 26.0–34.0)
MCHC: 32 g/dL (ref 30.0–36.5)
MCV: 97.6 FL (ref 80.0–99.0)
MPV: 11.4 FL (ref 8.9–12.9)
NRBC Absolute: 0 10*3/uL (ref 0.00–0.01)
Nucleated RBCs: 0 PER 100 WBC
Platelets: 186 10*3/uL (ref 150–400)
RBC: 3.78 M/uL — ABNORMAL LOW (ref 3.80–5.20)
RDW: 11.9 % (ref 11.5–14.5)
WBC: 5.6 10*3/uL (ref 3.6–11.0)

## 2019-07-13 LAB — LIPID PANEL
CHOL/HDL Ratio: 3.7 (ref 0.0–5.0)
Chol/HDL Ratio: 3.7 (ref 0.0–5.0)
Cholesterol, Total: 267 MG/DL — ABNORMAL HIGH (ref <200)
Cholesterol, total: 267 MG/DL — ABNORMAL HIGH (ref <200)
HDL Cholesterol: 72 MG/DL
HDL: 72 MG/DL
LDL Calculated: 158.6 MG/DL — ABNORMAL HIGH (ref 0–100)
LDL, calculated: 158.6 MG/DL — ABNORMAL HIGH (ref 0–100)
Triglyceride: 182 MG/DL — ABNORMAL HIGH (ref <150)
Triglycerides: 182 MG/DL — ABNORMAL HIGH (ref <150)
VLDL Cholesterol Calculated: 36.4 MG/DL
VLDL, calculated: 36.4 MG/DL

## 2019-07-13 LAB — VITAMIN D 25 HYDROXY: Vit D, 25-Hydroxy: 19.7 ng/mL — ABNORMAL LOW (ref 30–100)

## 2019-07-13 LAB — CBC W/O DIFF
ABSOLUTE NRBC: 0 10*3/uL (ref 0.00–0.01)
HCT: 36.9 % (ref 35.0–47.0)
HGB: 11.8 g/dL (ref 11.5–16.0)
MCH: 31.2 PG (ref 26.0–34.0)
MCHC: 32 g/dL (ref 30.0–36.5)
MCV: 97.6 FL (ref 80.0–99.0)
MPV: 11.4 FL (ref 8.9–12.9)
NRBC: 0 PER 100 WBC
PLATELET: 186 10*3/uL (ref 150–400)
RBC: 3.78 M/uL — ABNORMAL LOW (ref 3.80–5.20)
RDW: 11.9 % (ref 11.5–14.5)
WBC: 5.6 10*3/uL (ref 3.6–11.0)

## 2019-07-13 LAB — METABOLIC PANEL, COMPREHENSIVE
A-G Ratio: 1.3 (ref 1.1–2.2)
ALT (SGPT): 14 U/L (ref 12–78)
AST (SGOT): 14 U/L — ABNORMAL LOW (ref 15–37)
Albumin: 3.8 g/dL (ref 3.5–5.0)
Alk. phosphatase: 100 U/L (ref 45–117)
Anion gap: 7 mmol/L (ref 5–15)
BUN/Creatinine ratio: 26 — ABNORMAL HIGH (ref 12–20)
BUN: 24 MG/DL — ABNORMAL HIGH (ref 6–20)
Bilirubin, total: 0.3 MG/DL (ref 0.2–1.0)
CO2: 29 mmol/L (ref 21–32)
Calcium: 9.6 MG/DL (ref 8.5–10.1)
Chloride: 103 mmol/L (ref 97–108)
Creatinine: 0.92 MG/DL (ref 0.55–1.02)
GFR est AA: >60 mL/min/{1.73_m2} (ref >60)
GFR est non-AA: 59 mL/min/{1.73_m2} — ABNORMAL LOW (ref >60)
Globulin: 3 g/dL (ref 2.0–4.0)
Glucose: 101 mg/dL — ABNORMAL HIGH (ref 65–100)
Potassium: 3.9 mmol/L (ref 3.5–5.1)
Protein, total: 6.8 g/dL (ref 6.4–8.2)
Sodium: 139 mmol/L (ref 136–145)

## 2019-07-13 LAB — VITAMIN D, 25 HYDROXY: Vitamin D 25-Hydroxy: 19.7 ng/mL — ABNORMAL LOW (ref 30–100)

## 2019-08-06 ENCOUNTER — Ambulatory Visit: Payer: Medicare Other | Attending: Internal Medicine

## 2019-08-06 DIAGNOSIS — Z23 Encounter for immunization: Secondary | ICD-10-CM

## 2019-08-07 NOTE — Progress Notes (Signed)
   Covid-19 Vaccination Clinic  Name:  [name]    MRN: [id number] DOB: 01/23/1941  08/06/2019  [name] was observed post Covid-19 immunization for 15 minutes without incidence. She was provided with Vaccine Information Sheet and instruction to access the V-Safe system.   [name] was instructed to call 911 with any severe reactions post vaccine: [name] Difficulty breathing  . Swelling of your face and throat  . A fast heartbeat  . A bad rash all over your body  . Dizziness and weakness    Immunizations Administered    Name Date Dose VIS Date Route   Moderna COVID-19 Vaccine 08/06/2019  4:45 PM 0.5 mL 06/13/2019 Intramuscular   Manufacturer: Gala Murdoch   Lot: [id number]   NDC: 62863-817-71      Documented on behalf of: C. Jimmey Ralph

## 2019-09-03 ENCOUNTER — Ambulatory Visit: Payer: Medicare Other | Attending: Internal Medicine

## 2019-09-03 DIAGNOSIS — Z23 Encounter for immunization: Secondary | ICD-10-CM

## 2019-09-03 NOTE — Progress Notes (Signed)
   Covid-19 Vaccination Clinic  Name:  EMMANUELLA MIRANTE    MRN: [id number] DOB: 1941/01/07  09/03/2019  Ms. Carboni was observed post Covid-19 immunization for 15 minutes without incidence. She was provided with Vaccine Information Sheet and instruction to access the V-Safe system.   Ms. Ruff was instructed to call 911 with any severe reactions post vaccine: [name] Difficulty breathing  . Swelling of your face and throat  . A fast heartbeat  . A bad rash all over your body  . Dizziness and weakness    Immunizations Administered    Name Date Dose VIS Date Route   Moderna COVID-19 Vaccine 09/03/2019  4:05 PM 0.5 mL 06/13/2019 Intramuscular   Manufacturer: Moderna   Lot: [id number]   NDC: 33825-053-97

## 2019-09-16 ENCOUNTER — Ambulatory Visit: Admit: 2019-09-16 | Payer: MEDICARE | Primary: Internal Medicine

## 2019-09-16 ENCOUNTER — Ambulatory Visit: Primary: Internal Medicine

## 2019-09-16 DIAGNOSIS — Z23 Encounter for immunization: Secondary | ICD-10-CM

## 2019-10-07 ENCOUNTER — Ambulatory Visit: Admit: 2019-10-07 | Payer: MEDICARE | Primary: Internal Medicine

## 2019-10-07 ENCOUNTER — Ambulatory Visit: Primary: Internal Medicine

## 2019-10-07 DIAGNOSIS — Z23 Encounter for immunization: Secondary | ICD-10-CM

## 2019-11-06 ENCOUNTER — Encounter: Payer: Self-pay | Admitting: Cardiology

## 2019-11-06 ENCOUNTER — Ambulatory Visit (INDEPENDENT_AMBULATORY_CARE_PROVIDER_SITE_OTHER): Payer: Medicare Other | Admitting: Cardiology

## 2019-11-06 ENCOUNTER — Other Ambulatory Visit: Payer: Self-pay

## 2019-11-06 VITALS — BP 124/72 | HR 68 | Ht 62.0 in | Wt 142.6 lb

## 2019-11-06 DIAGNOSIS — I350 Nonrheumatic aortic (valve) stenosis: Secondary | ICD-10-CM | POA: Diagnosis not present

## 2019-11-06 DIAGNOSIS — R072 Precordial pain: Secondary | ICD-10-CM

## 2019-11-06 DIAGNOSIS — I2510 Atherosclerotic heart disease of native coronary artery without angina pectoris: Secondary | ICD-10-CM

## 2019-11-06 DIAGNOSIS — I110 Hypertensive heart disease with heart failure: Secondary | ICD-10-CM

## 2019-11-06 DIAGNOSIS — I5181 Takotsubo syndrome: Secondary | ICD-10-CM

## 2019-11-06 DIAGNOSIS — E785 Hyperlipidemia, unspecified: Secondary | ICD-10-CM

## 2019-11-06 MED ORDER — AMLODIPINE BESYLATE 2.5 MG PO TABS: 2.5000 mg | ORAL_TABLET | Freq: Every day | ORAL | 3 refills | Status: DC

## 2019-11-06 MED ORDER — PANTOPRAZOLE SODIUM 40 MG PO TBEC: 40.0000 mg | DELAYED_RELEASE_TABLET | Freq: Every day | ORAL | 3 refills | Status: DC

## 2019-11-06 NOTE — Progress Notes (Signed)
Cardiology Office Note:    Date:  11/06/2019   ID:  [name], DOB 23-Nov-1940, MRN [id number]  PCP:  Kelton Pillar, MD  Cardiologist:  Fransico Him, MD    Referring MD: Kelton Pillar, MD   Chief Complaint  Patient presents with  . Coronary Artery Disease  . Hyperlipidemia  . Hypertension  . Aortic Stenosis    History of Present Illness:    [name] is a 79 y.o. female with a hx of CAD 2nd toNSTEMI in 05/2016 felt due to Takotsubo CM,hypertensive heart diseae/atypical HOCM, mild aortic stenosis, posterior noncommunicating artery aneurysm (followed by Dr. 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   Cardiac cath10/10/17 revealed 25 mCx, 75% ostial D1, LVEF 35-45% in pattern of Takotsubo CM, and a 55 mmHg LVOT gradient felt due to subvalvular stenosis.Echo 05/2016 showed EF 60-65%, grade 1 DD, moderate focal hypertrophy of the basilar septum with a narrowed LV outflow tract and mild SAM. There was minimal LVOT gradient of 2mmHg, felt possibly HOCM variant. She underwent cardiac MRI 07/2016 showing moderate basal septal hypertrophy not classic for HOCM morphology with no delayed gadolium uptake to suggest myofibrillar disarray, + SAM with small LVOT gradient in regard to physiology and mild MR, mild LAE, no delayed gad uptake.  Patient has longstanding history of atypical chest discomfort. Poor historian. Last stress test August 2019 was low risk.  Seen 02/03/2019 for chest pain and shortness of breath while having palpitations. She did not have a single episode of palpitation without chest pain.  She is here today for followup and is doing well.  She continues to have chest pain that has become more frequent and more severe.  It feels like a toothache in her chest that radfates into her left shoulder and arm. It is nonexertional and happens when laying in bed at night, sitting on her couch or walking around.  It  lasts about 5 to 6 min.  She thinks it is indigestion and is on Pepcid.  She does not take anything and it resolves.   She denies any SOB, DOE, PND, orthopnea, LE edema, dizziness, palpitations or syncope. She is compliant with her meds and is tolerating meds with no SE.    Past Medical History:  Diagnosis Date  . CAD in native artery    a. 04/2016 NSTEMI with cath showing 25% MCx, 75% DX1 lesion and moderate LVD 35-45% out of proportion to CAD and in a pattern of Takotsubo CM (no clear stressor)  . Dyslipidemia   . GERD (gastroesophageal reflux disease)   . Hypercholesteremia   . Hypertension   . Hypertensive heart disease    initially she was thought to have a HOCM variant by cath with increased LVOT gradient but echo showed only a 25mmHg gradient across the LVOT and SAM and cardiac MRI did not show any late gad enhancement and only a 39mmHg LVOT gradient with 15mm BSH.  There was some mild SAM and mild MR.  EF normal at 76%.    [name] LVH (left ventricular hypertrophy)    a. cMRI 2018 - showing moderate Basal septal hypertrophy not classic for HOCM morphology with no delayed gadolium uptake to suggest myofibrillar disarray, + SAM with small LVOT gradient in regard to physiology and mild MR.  . Mild aortic stenosis   . NSTEMI (non-ST elevated myocardial infarction) ([place]) 04/2016   secondary to stress CM - Takotsubo CM  . PAC (premature atrial  contraction)    Event monitors 10/2013 and 08/2014 showed NSR, ST, PACs, PVCs.   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 Posterior communicating artery aneurysm    followed by Dr Venetia Maxon  . PVC's (premature ventricular contractions)    Event monitors 10/2013 and 08/2014 showed NSR, ST, PACs, PVCs.   . 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 ABDOMINAL HYSTERECTOMY    . BELOW KNEE LEG AMPUTATION     converted  to AKA 5/08  . CARDIAC CATHETERIZATION N/A 04/21/2016   Procedure: Left Heart Cath and Coronary Angiography;  Surgeon: Corky Crafts, MD;  Location: [facility]VAS[facility] CV LAB;  Service: Cardiovascular;  Laterality: N/A;  . IR GENERIC HISTORICAL  02/13/2016   IR RADIOLOGY PERIPHERAL GUIDED IV START 02/13/2016 Berdine Dance, MD [facility]  . IR GENERIC HISTORICAL  02/13/2016   IR US GUIDE VASC ACCESS LEFT 02/13/2016 Berdine Dance, MD [facility]    Current Medications: Current Meds  Medication Sig  . acetaminophen (TYLENOL) 325 MG tablet Take 2 tablets (650 mg total) by mouth every 4 (four) hours as needed for headache or mild pain.  [name] aspirin EC 81 MG EC tablet Take 1 tablet (81 mg total) by mouth daily.  [name] atorvastatin (LIPITOR) 40 MG tablet Take 40 mg by mouth at bedtime.  . Calcium Carbonate-Vitamin D (CALCIUM-D PO) Take 1 tablet by mouth daily.  . COD LIVER OIL PO Take 1 capsule by mouth daily.  . famotidine (PEPCID) 20 MG tablet Take 20 mg by mouth daily.  . fluticasone (FLONASE) 50 MCG/ACT nasal spray Place 1 spray into both nostrils daily as needed (congestion).   . fluticasone (FLOVENT HFA) 110 MCG/ACT inhaler Inhale 1 puff into the lungs as needed.   . furosemide (LASIX) 20 MG tablet TAKE 1 TABLET (20 MG TOTAL) BY MOUTH DAILY AS NEEDED FOR FLUID OR EDEMA.  [name] gabapentin (NEURONTIN) 300 MG capsule Take 300 mg by mouth 3 (three) times daily.  . hydrochlorothiazide (HYDRODIURIL) 12.5 MG tablet Take 12.5 mg by mouth daily.   . Multiple Vitamin (MULTIVITAMIN WITH MINERALS) TABS tablet Take 1 tablet by mouth daily.  . Omega-3 Fatty Acids (FISH OIL PO) Take 1 capsule by mouth daily at 2 PM.      Allergies:   Ace inhibitors, Codeine, and Penicillins   Social History   Socioeconomic History  . Marital status: Single    Spouse name: Not on file  . Number of children: Not on file  . Years of education: Not on file  . Highest education level: Not on file  Occupational History  . Not on  file  Tobacco Use  . Smoking status: Former Smoker    Quit date: 07/13/2004    Years since quitting: 15.3  . Smokeless tobacco: Never Used  Substance and Sexual Activity  . Alcohol use: Yes    Comment: ocassionally   . Drug use: No  . Sexual activity: Not on file  Other Topics Concern  . Not on file  Social History Narrative  . Not on file   Social Determinants of Health   Financial Resource Strain:   . Difficulty of Paying Living Expenses:   Food Insecurity:   . Worried About Programme researcher, broadcasting/film/video in the Last Year:   . [employer] of Food in the Last Year:  Transportation Needs:   . Freight forwarder (Medical):   [name] Lack of Transportation (Non-Medical):   Physical Activity:   . Days of Exercise per Week:   . Minutes of Exercise per Session:   Stress:   . Feeling of Stress :   Social Connections:   . Frequency of Communication with Friends and Family:   . Frequency of Social Gatherings with Friends and Family:   . Attends Religious Services:   . Active Member of Clubs or Organizations:   . Attends Banker Meetings:   [name] Marital Status:      Family History: The patient's family history includes Heart attack in her brother, mother, and sister; Heart disease in her brother, mother, and sister.  ROS:   Please see the history of present illness.    ROS  All other systems reviewed and negative.   EKGs/Labs/Other Studies Reviewed:    The following studies were reviewed today: EKG  EKG:  EKG is not ordered today.    Recent Labs: 02/03/2019: BUN 11; Creatinine, Ser 0.89; Magnesium 2.1; Potassium 4.0; Sodium 142   Recent Lipid Panel    Component Value Date/Time   CHOL 176 04/22/2016 0017   TRIG 260 (H) 04/22/2016 0017   HDL 45 04/22/2016 0017   CHOLHDL 3.9 04/22/2016 0017   VLDL 52 (H) 04/22/2016 0017   LDLCALC 79 04/22/2016 0017    Physical Exam:    VS:  BP 124/72   Pulse 68   Ht 5\' 2"  (1.575 m)   Wt 142 lb 9.6 oz (64.7 kg)   SpO2 98%   BMI  26.08 kg/m     Wt Readings from Last 3 Encounters:  11/06/19 142 lb 9.6 oz (64.7 kg)  03/27/19 142 lb 12.8 oz (64.8 kg)  02/03/19 145 lb 3.2 oz (65.9 kg)     GEN:  Well nourished, well developed in no acute distress HEENT: Normal NECK: No JVD; No carotid bruits LYMPHATICS: No lymphadenopathy CARDIAC: RRR, no murmurs, rubs, gallops RESPIRATORY:  Clear to auscultation without rales, wheezing or rhonchi  ABDOMEN: Soft, non-tender, non-distended MUSCULOSKELETAL:  No edema; No deformity  SKIN: Warm and dry NEUROLOGIC:  Alert and oriented x 3 PSYCHIATRIC:  Normal affect   ASSESSMENT:    1. CAD in native artery   2. Hypertensive heart disease with congestive heart failure, unspecified heart failure type (HCC)   3. Takotsubo cardiomyopathy   4. Mild aortic stenosis   5. Dyslipidemia    PLAN:    In order of problems listed above:  1.  ASCAD - Cardiac cath10/10/17 revealed 25 mCx, 75% ostial D1, LVEF 35-45% in pattern of Takotsubo CM, and a 55 mmHg LVOT gradient felt due to subvalvular stenosis. -she has had chronic CP for years and thinks that it has increased in frequency and severity -She also tends to give a vague history, making assessment challenging -she is noncompliant with meds and has been given a Rx for amlodipine several times and has not gotten it filled -will try starting amlodipine 2.5mg  daily for antianginal control (had avoided nitrates in the past due to ? HOCM dx in the past) -get a Lexiscan myoview to rule out ischemia and 2D echo to reassess AV and LVF -change Pepcid to Protonix 40mg  daily to see if this is GERD -followup with PA in 3 weeks  2.  Hypertensive heart disease  -Bp controlled -continue  Carvedilol 12.5mg  BID and HCTZ 12.5mg  daily  3.  Takotsubo cardiomyopathy  -LVF normal on  echo 2019 at 65-70%  4.  Mild aortic stenosis  -Mild AS by echo 2019 with mean AVG  5.  HLD -LDL goal < 70 -continue lipitor 40mg  daily -repeat FLP and  ALT   Medication Adjustments/Labs and Tests Ordered: Current medicines are reviewed at length with the patient today.  Concerns regarding medicines are outlined above.  No orders of the defined types were placed in this encounter.  No orders of the defined types were placed in this encounter.   Signed, , MD  11/06/2019 11:23 AM    [facility]

## 2019-11-06 NOTE — Addendum Note (Signed)
Addended by: Theresia Majors on: 11/06/2019 11:38 AM   Modules accepted: Orders

## 2019-11-06 NOTE — Patient Instructions (Signed)
Medication Instructions:  Your physician has recommended you make the following change in your medication:  1) STOP taking Pepcid  2) START taking amlodipine 2.5 mg daily 3) START taking Protonix (pantaprazole) 40 mg daily   *If you need a refill on your cardiac medications before your next appointment, please call your pharmacy*   Lab Work: Fasting lipids and ALT when you come in for stress test.   If you have labs (blood work) drawn today and your tests are completely normal, you will receive your results only by: [name] MyChart Message (if you have MyChart) OR . A paper copy in the mail If you have any lab test that is abnormal or we need to change your treatment, we will call you to review the results.   Testing/Procedures: Your physician has requested that you have a lexiscan myoview. For further information please visit [email or web address] Please follow instruction sheet, as given.  Your physician has requested that you have an echocardiogram. Echocardiography is a painless test that uses sound waves to create images of your heart. It provides your doctor with information about the size and shape of your heart and how well your heart's chambers and valves are working. This procedure takes approximately one hour. There are no restrictions for this procedure.   Follow-Up: At [facility], you and your health needs are our priority.  As part of our continuing mission to provide you with exceptional heart care, we have created designated Provider Care Teams.  These Care Teams include your primary Cardiologist (physician) and Advanced Practice Providers (APPs -  Physician Assistants and Nurse Practitioners) who all work together to provide you with the care you need, when you need it.  We recommend signing up for the patient portal called "MyChart".  Sign up information is provided on this After Visit Summary.  MyChart is used to connect with patients for Virtual Visits (Telemedicine).   Patients are able to view lab/test results, encounter notes, upcoming appointments, etc.  Non-urgent messages can be sent to your provider as well.   To learn more about what you can do with MyChart, go to [email or web address].    Your next appointment:   3 week(s)  The format for your next appointment:   In Person  Provider:   Ronie Spies, PA-C or Jacolyn Reedy, PA-C

## 2019-11-21 ENCOUNTER — Ambulatory Visit (HOSPITAL_COMMUNITY)
Admission: RE | Admit: 2019-11-21 | Discharge: 2019-11-21 | Disposition: A | Discharge status: Home / Self Care | Payer: Medicare Other | Source: Ambulatory Visit | Attending: Cardiology | Admitting: Cardiology

## 2019-11-21 DIAGNOSIS — Z01812 Encounter for preprocedural laboratory examination: Secondary | ICD-10-CM | POA: Diagnosis present

## 2019-11-21 DIAGNOSIS — Z20822 Contact with and (suspected) exposure to covid-19: Secondary | ICD-10-CM | POA: Diagnosis not present

## 2019-11-21 LAB — SARS CORONAVIRUS 2 (TAT 6-24 HRS): SARS Coronavirus 2: NEGATIVE

## 2019-11-22 ENCOUNTER — Telehealth (HOSPITAL_COMMUNITY): Payer: Self-pay | Admitting: *Deleted

## 2019-11-22 NOTE — Telephone Encounter (Signed)
Patient given detailed instructions per Myocardial Perfusion Study Information Sheet for the test on 11/24/19 at 10:30. Patient notified to arrive 15 minutes early and that it is imperative to arrive on time for appointment to keep from having the test rescheduled.  If you need to cancel or reschedule your appointment, please call the office within 24 hours of your appointment. . Patient verbalized understanding.[name]

## 2019-11-24 ENCOUNTER — Other Ambulatory Visit: Payer: Self-pay

## 2019-11-24 ENCOUNTER — Other Ambulatory Visit: Payer: Medicare Other | Admitting: *Deleted

## 2019-11-24 ENCOUNTER — Ambulatory Visit (HOSPITAL_COMMUNITY): Payer: Medicare Other | Attending: Cardiovascular Disease

## 2019-11-24 ENCOUNTER — Ambulatory Visit (HOSPITAL_BASED_OUTPATIENT_CLINIC_OR_DEPARTMENT_OTHER): Payer: Medicare Other

## 2019-11-24 VITALS — Ht 62.0 in | Wt 142.0 lb

## 2019-11-24 DIAGNOSIS — E785 Hyperlipidemia, unspecified: Secondary | ICD-10-CM

## 2019-11-24 DIAGNOSIS — R110 Nausea: Secondary | ICD-10-CM

## 2019-11-24 DIAGNOSIS — R072 Precordial pain: Secondary | ICD-10-CM | POA: Diagnosis present

## 2019-11-24 LAB — ECHOCARDIOGRAM COMPLETE
Height: 62 in
Weight: 2272 oz

## 2019-11-24 LAB — MYOCARDIAL PERFUSION IMAGING
LV dias vol: 44 mL (ref 46–106)
LV sys vol: 16 mL
Peak HR: 84 {beats}/min
Rest HR: 50 {beats}/min
SDS: 4
SRS: 2
SSS: 6
TID: 1.08

## 2019-11-24 LAB — LIPID PANEL
Chol/HDL Ratio: 3.8 ratio (ref 0.0–4.4)
Cholesterol, Total: 196 mg/dL (ref 100–199)
HDL: 52 mg/dL (ref >39)
LDL Chol Calc (NIH): 126 mg/dL — ABNORMAL HIGH (ref 0–99)
Triglycerides: 102 mg/dL (ref 0–149)
VLDL Cholesterol Cal: 18 mg/dL (ref 5–40)

## 2019-11-24 LAB — ALT: ALT: 9 IU/L (ref 0–32)

## 2019-11-24 MED ORDER — TECHNETIUM TC 99M TETROFOSMIN IV KIT
31.7000 | PACK | Freq: Once | INTRAVENOUS | Status: AC | PRN
  Administered 2019-11-24: 31.7 via INTRAVENOUS
  Filled 2019-11-24: qty 32

## 2019-11-24 MED ORDER — PERFLUTREN LIPID MICROSPHERE
1.0000 mL | INTRAVENOUS | Status: AC | PRN
  Administered 2019-11-24: 1 mL via INTRAVENOUS

## 2019-11-24 MED ORDER — REGADENOSON 0.4 MG/5ML IV SOLN
0.4000 mg | Freq: Once | INTRAVENOUS | Status: AC
  Administered 2019-11-24: 0.4 mg via INTRAVENOUS

## 2019-11-24 MED ORDER — AMINOPHYLLINE 25 MG/ML IV SOLN
75.0000 mg | Freq: Once | INTRAVENOUS | Status: AC
  Administered 2019-11-24: 75 mg via INTRAVENOUS

## 2019-11-24 MED ORDER — TECHNETIUM TC 99M TETROFOSMIN IV KIT
10.2000 | PACK | Freq: Once | INTRAVENOUS | Status: AC | PRN
  Administered 2019-11-24: 10.2 via INTRAVENOUS
  Filled 2019-11-24: qty 11

## 2019-11-27 ENCOUNTER — Telehealth: Payer: Self-pay

## 2019-11-27 DIAGNOSIS — I2510 Atherosclerotic heart disease of native coronary artery without angina pectoris: Secondary | ICD-10-CM

## 2019-11-27 MED ORDER — ATORVASTATIN CALCIUM 80 MG PO TABS
80.0000 mg | ORAL_TABLET | Freq: Every day | ORAL | 3 refills | Status: DC
Start: 2019-11-27 — End: 2020-09-17

## 2019-11-27 NOTE — Telephone Encounter (Signed)
-----   Message from Quintella Reichert, MD sent at 11/26/2019  6:35 PM EDT ----- LDL not at goal - please increase Lipitor to 80mg  daily and repeat FLP and ALT In 6 weeks

## 2019-12-15 ENCOUNTER — Ambulatory Visit: Payer: Medicare Other | Admitting: Physician Assistant

## 2020-01-03 ENCOUNTER — Encounter (INDEPENDENT_AMBULATORY_CARE_PROVIDER_SITE_OTHER): Payer: Self-pay

## 2020-01-03 ENCOUNTER — Other Ambulatory Visit: Payer: Medicare Other | Admitting: *Deleted

## 2020-01-03 ENCOUNTER — Other Ambulatory Visit: Payer: Self-pay

## 2020-01-03 DIAGNOSIS — I2510 Atherosclerotic heart disease of native coronary artery without angina pectoris: Secondary | ICD-10-CM

## 2020-01-03 LAB — LIPID PANEL
Chol/HDL Ratio: 3 ratio (ref 0.0–4.4)
Cholesterol, Total: 161 mg/dL (ref 100–199)
HDL: 53 mg/dL (ref >39)
LDL Chol Calc (NIH): 92 mg/dL (ref 0–99)
Triglycerides: 87 mg/dL (ref 0–149)
VLDL Cholesterol Cal: 16 mg/dL (ref 5–40)

## 2020-01-03 LAB — ALT: ALT: 17 IU/L (ref 0–32)

## 2020-01-08 ENCOUNTER — Telehealth: Payer: Self-pay

## 2020-01-08 DIAGNOSIS — E785 Hyperlipidemia, unspecified: Secondary | ICD-10-CM

## 2020-01-08 MED ORDER — EZETIMIBE 10 MG PO TABS
10.0000 mg | ORAL_TABLET | Freq: Every day | ORAL | 3 refills | Status: DC
Start: 2020-01-08 — End: 2021-05-08

## 2020-01-08 NOTE — Telephone Encounter (Signed)
-----   Message from Quintella Reichert, MD sent at 01/04/2020  1:00 PM EDT ----- LDL still not at goal, add Zetia 10mg  daily and repeat FLp and ALT in 6 weeks

## 2020-01-08 NOTE — Telephone Encounter (Signed)
The patient has been notified of the result and verbalized understanding.  All questions (if any) were answered. Theresia Majors, RN 01/08/2020 2:21 PM

## 2020-01-09 ENCOUNTER — Encounter: Payer: Self-pay | Admitting: Orthopedic Surgery

## 2020-01-09 ENCOUNTER — Other Ambulatory Visit: Payer: Self-pay

## 2020-01-09 ENCOUNTER — Ambulatory Visit: Payer: Medicare Other | Admitting: Family

## 2020-01-09 ENCOUNTER — Ambulatory Visit (INDEPENDENT_AMBULATORY_CARE_PROVIDER_SITE_OTHER): Payer: Medicare Other | Admitting: Orthopedic Surgery

## 2020-01-09 VITALS — Ht 62.0 in | Wt 142.0 lb

## 2020-01-09 DIAGNOSIS — Z89611 Acquired absence of right leg above knee: Secondary | ICD-10-CM

## 2020-01-09 NOTE — Progress Notes (Signed)
Office Visit Note   Patient: [name]           Date of Birth: 03-19-1941           MRN: [id number] Visit Date: 01/09/2020              Requested by: Maurice Small, MD [address] [place],  [place] [address] PCP: Maurice Small, MD  Chief Complaint  Patient presents with  . Right Leg - Follow-up    HX 12 years ago right AKA      HPI: Patient is an 79 year old woman with a 63 year old prosthesis for right above-the-knee amputation.  Patient states that she was having increasing difficulty ambulating with the leg.  She states that due to recent weight loss she has an improper fit of the prosthesis she has loss of volume in the residual limb she complains of soreness over the end of her stump with weightbearing she complains of rotational instability she states the stump comes out of the socket when walking and that the socket slides up and down her leg with the socket being too large for her leg.  Assessment & Plan: Visit Diagnoses:  1. Hx of AKA (above knee amputation), right (HCC)     Plan: Patient was given a prescription for [facility] for a new above-the-knee prosthesis new liner new materials new supplies.  Patient is a K2 level ambulator.  Follow-Up Instructions: Return if symptoms worsen or fail to improve.   Ortho Exam  Patient is alert, oriented, no adenopathy, well-dressed, normal affect, normal respiratory effort. Examination patient ambulates with a walker she has instability of her leg with lack of rotational stability.  There are no open ulcers no wounds no cellulitis no signs of infection.  Imaging: No results found. No images are attached to the encounter.  Labs: No results found for: HGBA1C, ESRSEDRATE, CRP, LABURIC, REPTSTATUS, GRAMSTAIN, CULT, LABORGA   Lab Results  Component Value Date   ALBUMIN 3.8 04/21/2016    Lab Results  Component Value Date   MG 2.1 02/03/2019   MG 2.4 (H) 11/16/2017   MG 2.0 04/21/2016   No  results found for: VD25OH  No results found for: PREALBUMIN CBC EXTENDED Latest Ref Rng & Units 11/16/2017 08/30/2017 04/23/2016  WBC 3.4 - 10.8 x10E3/uL 5.8 5.8 7.9  RBC 3.77 - 5.28 x10E6/uL 4.37 4.51 3.83(L)  HGB 11.1 - 15.9 g/dL 06.2 37.6 11.1(L)  HCT 34.0 - 46.6 % 39.2 41.5 34.3(L)  PLT 150 - 379 x10E3/uL 154 152 142(L)  NEUTROABS 1 - 7 x10E3/uL 2.2 - -  LYMPHSABS 0 - 3 x10E3/uL 2.9 - -     Body mass index is 25.97 kg/m.  Orders:  No orders of the defined types were placed in this encounter.  No orders of the defined types were placed in this encounter.    Procedures: No procedures performed  Clinical Data: No additional findings.  ROS:  All other systems negative, except as noted in the HPI. Review of Systems  Objective: Vital Signs: Ht 5\' 2"  (1.575 m)   Wt 142 lb (64.4 kg)   BMI 25.97 kg/m   Specialty Comments:  No specialty comments available.  PMFS History: Patient Active Problem List   Diagnosis Date Noted  . Memory loss 11/23/2017  . Takotsubo cardiomyopathy   . Hypertensive heart disease   . Brain aneurysm 04/13/2016  . Dyslipidemia   . CAD in native artery   . PAC (premature atrial contraction)  08/14/2014  . PVC's (premature ventricular contractions) 08/14/2014  . Palpitations 11/02/2013  . Essential hypertension, benign 11/01/2013  . Peripheral vascular disease, unspecified (HCC) 11/01/2013   Past Medical History:  Diagnosis Date  . CAD in native artery    a. 04/2016 NSTEMI with cath showing 25% MCx, 75% DX1 lesion and moderate LVD 35-45% out of proportion to CAD and in a pattern of Takotsubo CM (no clear stressor)  . Dyslipidemia   . GERD (gastroesophageal reflux disease)   . Hypercholesteremia   . Hypertension   . Hypertensive heart disease    initially she was thought to have a HOCM variant by cath with increased LVOT gradient but echo showed only a gradient across the LVOT and SAM and cardiac MRI did not show any late gad  enhancement and only a LVOT gradient with 54mm BSH.  There was some mild SAM and mild MR.  EF normal at 76%.    [name] LVH (left ventricular hypertrophy)    a. cMRI 2018 - showing moderate Basal septal hypertrophy not classic for HOCM morphology with no delayed gadolium uptake to suggest myofibrillar disarray, + SAM with small LVOT gradient in regard to physiology and mild MR.  . Mild aortic stenosis   . NSTEMI (non-ST elevated myocardial infarction) (HCC) 04/2016   secondary to stress CM - Takotsubo CM  . PAC (premature atrial contraction)    Event monitors 10/2013 and 08/2014 showed NSR, ST, PACs, PVCs.   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 Posterior communicating artery aneurysm    followed by Dr Venetia Maxon  . PVC's (premature ventricular contractions)    Event monitors 10/2013 and 08/2014 showed NSR, ST, PACs, PVCs.   . 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 Heart attack Mother   . Heart disease Mother   . Heart attack Brother   . Heart disease Brother   . Heart attack Sister   . Heart disease Sister     Past Surgical History:  Procedure Laterality Date  . ABDOMINAL HYSTERECTOMY    . 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 CARDIAC CATHETERIZATION N/A 04/21/2016   Procedure: Left Heart Cath and Coronary Angiography;  Surgeon: Corky Crafts, MD;  Location: [facility] INVASIVE CV LAB;  Service: Cardiovascular;  Laterality: N/A;  . IR GENERIC HISTORICAL  02/13/2016   IR RADIOLOGY PERIPHERAL GUIDED IV START 02/13/2016 Berdine Dance, MD [facility]  . IR GENERIC HISTORICAL  02/13/2016   IR US GUIDE VASC ACCESS LEFT 02/13/2016 Berdine Dance, MD [facility]   Social History   Occupational History  . Not on file  Tobacco Use  . Smoking status: Former Smoker    Quit date: 07/13/2004    Years since quitting: 15.5  . Smokeless  tobacco: Never Used  Vaping Use  . Vaping Use: Never used  Substance and Sexual Activity  . Alcohol use: Yes    Comment: ocassionally   . Drug use: No  . Sexual activity: Not on file

## 2020-02-19 ENCOUNTER — Other Ambulatory Visit: Payer: Medicare Other

## 2020-04-25 LAB — COVID-19: SARS-CoV-2, NAA: NEGATIVE

## 2020-04-25 LAB — NOVEL CORONAVIRUS (COVID-19): SARS-CoV-2, NAA: NEGATIVE

## 2020-05-01 ENCOUNTER — Other Ambulatory Visit: Payer: Self-pay | Admitting: Family Medicine

## 2020-05-01 DIAGNOSIS — Z1231 Encounter for screening mammogram for malignant neoplasm of breast: Secondary | ICD-10-CM

## 2020-05-29 ENCOUNTER — Ambulatory Visit: Payer: Medicare Other

## 2020-06-04 ENCOUNTER — Other Ambulatory Visit: Payer: Self-pay

## 2020-06-04 ENCOUNTER — Ambulatory Visit
Admission: RE | Admit: 2020-06-04 | Discharge: 2020-06-04 | Disposition: A | Discharge status: Home / Self Care | Payer: Medicare Other | Source: Ambulatory Visit | Attending: Family Medicine | Admitting: Family Medicine

## 2020-06-04 DIAGNOSIS — Z1231 Encounter for screening mammogram for malignant neoplasm of breast: Secondary | ICD-10-CM

## 2020-06-21 ENCOUNTER — Encounter: Attending: Internal Medicine | Primary: Internal Medicine

## 2020-06-28 ENCOUNTER — Telehealth: Payer: Self-pay | Admitting: Cardiology

## 2020-06-28 NOTE — Telephone Encounter (Signed)
Spoke with the patient who states that she has been having indigestion when she lays down at night. She states that she has been taking a Pepcid AC at night which helps. She is also taking Protonix daily.  She denies any chest pain, SOB, dizziness, palpitations. She states that her blood pressure has been good but does not recall her recent reading. Advised her to contact her PCP to let them know. She has an overdue follow-up visit scheduled with Dr. Mayford Knife on 01/12.

## 2020-06-28 NOTE — Telephone Encounter (Signed)
Patient states she has been getting indigestion when she moves around and sometimes when she is laying down as well. She states she does take a medication for it, but it does not seem to last all day.

## 2020-07-09 ENCOUNTER — Encounter

## 2020-07-09 MED ORDER — CHLORTHALIDONE 25 MG TAB
25 mg | ORAL_TABLET | ORAL | 3 refills | Status: DC
Start: 2020-07-09 — End: 2021-10-02

## 2020-07-09 MED ORDER — TELMISARTAN 80 MG TAB
80 mg | ORAL_TABLET | ORAL | 3 refills | Status: DC
Start: 2020-07-09 — End: 2021-10-02

## 2020-07-19 ENCOUNTER — Ambulatory Visit: Payer: Medicare Other

## 2020-07-23 NOTE — Progress Notes (Signed)
This encounter was created in error - please disregard.

## 2020-07-24 ENCOUNTER — Encounter: Payer: Medicare Other | Admitting: Cardiology

## 2020-07-24 DIAGNOSIS — I2510 Atherosclerotic heart disease of native coronary artery without angina pectoris: Secondary | ICD-10-CM

## 2020-07-24 DIAGNOSIS — E785 Hyperlipidemia, unspecified: Secondary | ICD-10-CM

## 2020-07-24 DIAGNOSIS — I350 Nonrheumatic aortic (valve) stenosis: Secondary | ICD-10-CM

## 2020-07-24 DIAGNOSIS — I5181 Takotsubo syndrome: Secondary | ICD-10-CM

## 2020-07-24 DIAGNOSIS — I10 Essential (primary) hypertension: Secondary | ICD-10-CM

## 2020-08-21 ENCOUNTER — Ambulatory Visit: Payer: Medicare Other | Admitting: Cardiology

## 2020-08-23 ENCOUNTER — Ambulatory Visit
Admission: RE | Admit: 2020-08-23 | Discharge: 2020-08-23 | Disposition: A | Discharge status: Home / Self Care | Payer: Medicare Other | Source: Ambulatory Visit | Attending: Family Medicine | Admitting: Family Medicine

## 2020-08-23 ENCOUNTER — Other Ambulatory Visit: Payer: Self-pay

## 2020-08-23 DIAGNOSIS — Z1231 Encounter for screening mammogram for malignant neoplasm of breast: Secondary | ICD-10-CM | POA: Diagnosis not present

## 2020-08-29 ENCOUNTER — Other Ambulatory Visit: Payer: Self-pay | Admitting: Family Medicine

## 2020-08-29 DIAGNOSIS — R928 Other abnormal and inconclusive findings on diagnostic imaging of breast: Secondary | ICD-10-CM

## 2020-09-11 ENCOUNTER — Other Ambulatory Visit: Payer: Medicare Other

## 2020-09-16 ENCOUNTER — Other Ambulatory Visit: Payer: Self-pay

## 2020-09-16 ENCOUNTER — Encounter: Payer: Self-pay | Admitting: Cardiology

## 2020-09-16 ENCOUNTER — Ambulatory Visit (INDEPENDENT_AMBULATORY_CARE_PROVIDER_SITE_OTHER): Payer: Medicare Other | Admitting: Cardiology

## 2020-09-16 VITALS — BP 132/62 | HR 65 | Ht 62.0 in | Wt 133.8 lb

## 2020-09-16 DIAGNOSIS — I2510 Atherosclerotic heart disease of native coronary artery without angina pectoris: Secondary | ICD-10-CM | POA: Diagnosis not present

## 2020-09-16 DIAGNOSIS — I10 Essential (primary) hypertension: Secondary | ICD-10-CM | POA: Diagnosis not present

## 2020-09-16 DIAGNOSIS — I350 Nonrheumatic aortic (valve) stenosis: Secondary | ICD-10-CM | POA: Diagnosis not present

## 2020-09-16 DIAGNOSIS — E785 Hyperlipidemia, unspecified: Secondary | ICD-10-CM | POA: Diagnosis not present

## 2020-09-16 DIAGNOSIS — I5181 Takotsubo syndrome: Secondary | ICD-10-CM

## 2020-09-16 LAB — COMPREHENSIVE METABOLIC PANEL
ALT: 12 IU/L (ref 0–32)
AST: 20 IU/L (ref 0–40)
Albumin/Globulin Ratio: 1.7 (ref 1.2–2.2)
Albumin: 4.3 g/dL (ref 3.6–4.6)
Alkaline Phosphatase: 60 IU/L (ref 44–121)
BUN/Creatinine Ratio: 17 (ref 12–28)
BUN: 15 mg/dL (ref 8–27)
Bilirubin Total: 0.8 mg/dL (ref 0.0–1.2)
CO2: 22 mmol/L (ref 20–29)
Calcium: 10.2 mg/dL (ref 8.7–10.3)
Chloride: 104 mmol/L (ref 96–106)
Creatinine, Ser: 0.9 mg/dL (ref 0.57–1.00)
Globulin, Total: 2.5 g/dL (ref 1.5–4.5)
Glucose: 87 mg/dL (ref 65–99)
Potassium: 3.9 mmol/L (ref 3.5–5.2)
Sodium: 142 mmol/L (ref 134–144)
Total Protein: 6.8 g/dL (ref 6.0–8.5)
eGFR: 64 mL/min/{1.73_m2} (ref >59)

## 2020-09-16 LAB — LIPID PANEL
Chol/HDL Ratio: 2.8 ratio (ref 0.0–4.4)
Cholesterol, Total: 154 mg/dL (ref 100–199)
HDL: 55 mg/dL (ref >39)
LDL Chol Calc (NIH): 87 mg/dL (ref 0–99)
Triglycerides: 60 mg/dL (ref 0–149)
VLDL Cholesterol Cal: 12 mg/dL (ref 5–40)

## 2020-09-16 NOTE — Progress Notes (Signed)
Cardiology Office Note:    Date:  09/16/2020   ID:  [name], DOB Jul 30, 1940, MRN [id number]  PCP:  Maurice Small, MD  Cardiologist:  Armanda Magic, MD    Referring MD: Maurice Small, MD   Chief Complaint  Patient presents with  . Coronary Artery Disease  . Hypertension  . Cardiomyopathy  . Aortic Stenosis  . Hyperlipidemia    History of Present Illness:    [name] is a 80 y.o. female with a hx of CAD 2nd toNSTEMI in 05/2016 felt due to Takotsubo CM,hypertensive heart diseae/atypical HOCM, mild aortic stenosis, posterior noncommunicating artery aneurysm (followed by Dr. 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   Cardiac cath10/10/17 revealed 25 mCx, 75% ostial D1, LVEF 35-45% in pattern of Takotsubo CM, and a 55 mmHg LVOT gradient felt due to subvalvular stenosis.Echo 05/2016 showed EF 60-65%, grade 1 DD, moderate focal hypertrophy of the basilar septum with a narrowed LV outflow tract and mild SAM. There was minimal LVOT gradient of , felt possibly HOCM variant. She underwent cardiac MRI 07/2016 showing moderate basal septal hypertrophy not classic for HOCM morphology with no delayed gadolium uptake to suggest myofibrillar disarray, + SAM with small LVOT gradient in regard to physiology and mild MR, mild LAE, no delayed gad uptake.  Patient has longstanding history of atypical chest discomfort. Poor historian. Last stress test August 2019 and 11/2019 were low risk.    She is here today for followup and is doing well.  She has had some problems with chest pain (she describes it as indigestion) that is associated with belching and gas and if she belches her CP resolves.  She has not really had any anginal chest pain.  She also gets chronic DOE mainly when she is doing something very strenuous.  She denies any PND, orthopnea, dizziness or syncope. She occasionally h as some mild LE edema that resolves  overnight. She tells me that she has been having racing of her heart beat recently that can occur at rest or with exertion.  She is compliant with her meds and is tolerating meds with no SE.    Past Medical History:  Diagnosis Date  . CAD in native artery    a. 04/2016 NSTEMI with cath showing 25% MCx, 75% DX1 lesion and moderate LVD 35-45% out of proportion to CAD and in a pattern of Takotsubo CM (no clear stressor)  . Dyslipidemia   . GERD (gastroesophageal reflux disease)   . Hypercholesteremia   . Hypertension   . Hypertensive heart disease    initially she was thought to have a HOCM variant by cath with increased LVOT gradient but echo showed only a gradient across the LVOT and SAM and cardiac MRI did not show any late gad enhancement and only a LVOT gradient with 38mm BSH.  There was some mild SAM and mild MR.  EF normal at 76%.    [name] LVH (left ventricular hypertrophy)    a. cMRI 2018 - showing moderate Basal septal hypertrophy not classic for HOCM morphology with no delayed gadolium uptake to suggest myofibrillar disarray, + SAM with small LVOT gradient in regard to physiology and mild MR.  . Mild aortic stenosis   . NSTEMI (non-ST elevated myocardial infarction) (HCC) 04/2016   secondary to stress CM - Takotsubo CM  . PAC (premature atrial contraction)    Event monitors 10/2013 and 08/2014 showed NSR, ST, PACs, PVCs.   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 Posterior communicating artery aneurysm    followed by Dr Venetia MaxonStern  . PVC's (premature ventricular contractions)    Event monitors 10/2013 and 08/2014 showed NSR, ST, PACs, PVCs.   . 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 ABDOMINAL HYSTERECTOMY    . 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 CARDIAC CATHETERIZATION N/A 04/21/2016   Procedure: Left Heart  Cath and Coronary Angiography;  Surgeon: Corky CraftsJayadeep S Varanasi, MD;  [facility];  Service: Cardiovascular;  Laterality: N/A;  . IR GENERIC HISTORICAL  02/13/2016   IR RADIOLOGY PERIPHERAL GUIDED IV START 02/13/2016 Berdine DanceMichael Shick, MD [facility]-INTERV RAD  . IR GENERIC HISTORICAL  02/13/2016   IR US GUIDE VASC ACCESS LEFT 02/13/2016 Berdine DanceMichael Shick, MD [facility]    Current Medications: Current Meds  Medication Sig  . acetaminophen (TYLENOL) 325 MG tablet Take 2 tablets (650 mg total) by mouth every 4 (four) hours as needed for headache or mild pain.  [name]. amLODipine (NORVASC) 2.5 MG tablet Take 1 tablet (2.5 mg total) by mouth daily.  [name]. aspirin EC 81 MG EC tablet Take 1 tablet (81 mg total) by mouth daily.  [name]. atorvastatin (LIPITOR) 80 MG tablet Take 1 tablet (80 mg total) by mouth daily.  . Calcium Carbonate-Vitamin D (CALCIUM-D PO) Take 1 tablet by mouth daily.  . carvedilol (COREG) 12.5 MG tablet Take 1 tablet (12.5 mg total) by mouth 2 (two) times daily.  . COD LIVER OIL PO Take 1 capsule by mouth daily.  [name]. ezetimibe (ZETIA) 10 MG tablet Take 1 tablet (10 mg total) by mouth daily.  . fluticasone (FLONASE) 50 MCG/ACT nasal spray Place 1 spray into both nostrils daily as needed (congestion).   . fluticasone (FLOVENT HFA) 110 MCG/ACT inhaler Inhale 1 puff into the lungs as needed.   . gabapentin (NEURONTIN) 300 MG capsule Take 300 mg by mouth 3 (three) times daily.  . hydrochlorothiazide (HYDRODIURIL) 12.5 MG tablet Take 12.5 mg by mouth daily.  . Multiple Vitamin (MULTIVITAMIN WITH MINERALS) TABS tablet Take 1 tablet by mouth daily.  . Omega-3 Fatty Acids (FISH OIL PO) Take 1 capsule by mouth daily at 2 PM.   . pantoprazole (PROTONIX) 40 MG tablet Take 1 tablet (40 mg total) by mouth daily.     Allergies:   Other, Ace inhibitors, Codeine, and Penicillins   Social History   Socioeconomic History  . Marital status: Single    Spouse name: Not on file  . Number of children: Not on file   . Years of education: Not on file  . Highest education level: Not on file  Occupational History  . Not on file  Tobacco Use  . Smoking status: Former Smoker    Quit date: 07/13/2004    Years since quitting: 16.1  . Smokeless tobacco: Never Used  Vaping Use  . Vaping Use: Never used  Substance and Sexual Activity  . Alcohol use: Yes    Comment: ocassionally   . Drug use: No  . Sexual activity: Not on file  Other Topics Concern  . Not on file  Social History Narrative  . Not on file   Social Determinants of Health   Financial Resource Strain: Not on file  Food Insecurity: Not on file  Transportation  Needs: Not on file  Physical Activity: Not on file  Stress: Not on file  Social Connections: Not on file     Family History: The patient's family history includes Heart attack in her brother, mother, and sister; Heart disease in her brother, mother, and sister.  ROS:   Please see the history of present illness.    ROS  All other systems reviewed and negative.   EKGs/Labs/Other Studies Reviewed:    The following studies were reviewed today: EKG  Steffanie Dunn 11/2019 Study Highlights   Nuclear stress EF: 63%.  There was no ST segment deviation noted during stress.  The study is normal.  This is a low risk study.  The left ventricular ejection fraction is normal (55-65%).  2D echo 11/2019 IMPRESSIONS   1. Left ventricular ejection fraction, by estimation, is 60 to 65%. The  left ventricle has normal function. The left ventricle has no regional  wall motion abnormalities. There is mild left ventricular hypertrophy of  the basal-septal segment. Left ventricular diastolic parameters are consistent with Grade I diastolic  dysfunction (impaired relaxation). Elevated left atrial pressure.  2. Right ventricular systolic function is normal. The right ventricular  size is normal. Tricuspid regurgitation signal is inadequate for assessing  PA pressure.  3. The  mitral valve is normal in structure. Mild mitral valve  regurgitation. No evidence of mitral stenosis.  4. The aortic valve is normal in structure. Aortic valve regurgitation is  not visualized. No aortic stenosis is present.  5. The inferior vena cava is normal in size with greater than 50%  respiratory variability, suggesting right atrial pressure of 3 mmHg.   EKG:  EKG is ordered today and showed NSR with nonspecific ST abnormality  Recent Labs: 01/03/2020: ALT 17   Recent Lipid Panel    Component Value Date/Time   CHOL 161 01/03/2020 1106   TRIG 87 01/03/2020 1106   HDL 53 01/03/2020 1106   CHOLHDL 3.0 01/03/2020 1106   CHOLHDL 3.9 04/22/2016 0017   VLDL 52 (H) 04/22/2016 0017   LDLCALC 92 01/03/2020 1106    Physical Exam:    VS:  BP 132/62   Pulse 65   Ht 5\' 2"  (1.575 m)   Wt 133 lb 12.8 oz (60.7 kg)   BMI 24.47 kg/m     Wt Readings from Last 3 Encounters:  09/16/20 133 lb 12.8 oz (60.7 kg)  01/09/20 142 lb (64.4 kg)  11/24/19 142 lb (64.4 kg)    GEN: Well nourished, well developed in no acute distress HEENT: Normal NECK: No JVD; No carotid bruits LYMPHATICS: No lymphadenopathy CARDIAC:RRR, no rubs, gallops.  1/6 SM at RUSB RESPIRATORY:  Clear to auscultation without rales, wheezing or rhonchi  ABDOMEN: Soft, non-tender, non-distended MUSCULOSKELETAL:  No edema; right AKA SKIN: Warm and dry NEUROLOGIC:  Alert and oriented x 3 PSYCHIATRIC:  Normal affect   ASSESSMENT:    1. CAD in native artery   2. Essential hypertension, benign   3. Takotsubo cardiomyopathy   4. Mild aortic stenosis   5. Dyslipidemia    PLAN:    In order of problems listed above:  1.  ASCAD - Cardiac cath10/10/17 revealed 25 mCx, 75% ostial D1, LVEF 35-45% in pattern of Takotsubo CM, and a 55 mmHg LVOT gradient felt due to subvalvular stenosis. -she has had chronic CP for years  -she is noncompliant with meds and has been given a Rx for amlodipine several times and has not  gotten it filled (had avoided  nitrates in the past due to ? HOCM dx in the past) -Steffanie Dunn 11/2019 showed no ischemia -I think her chest discomfort is related to GERD and should continue on  Protonix 40mg  daily>>her discomfort is resolved with belching and she has a lot of gas>>encouraged her to followup with her PCP  2.  Hypertensive heart disease  -Bp controlled -continue  Carvedilol 12.5mg  BID ,amlodipine 2.5mg  daily and HCTZ 12.5mg  daily -check BMET  3.  Takotsubo cardiomyopathy  -LVF normal on echo 2019 at 65-70% and echo 11/2019  4.  Mild aortic stenosis  -no AS by echo 11/2019  5.  HLD -LDL goal < 70 -continue lipitor 40mg  daily -I will repeat an FLP and ALT   Medication Adjustments/Labs and Tests Ordered: Current medicines are reviewed at length with the patient today.  Concerns regarding medicines are outlined above.  Orders Placed This Encounter  Procedures  . EKG 12-Lead   No orders of the defined types were placed in this encounter.   Signed, 12/2019, MD  09/16/2020 11:33 AM    [facility]

## 2020-09-16 NOTE — Addendum Note (Signed)
Addended by: Theresia Majors on: 09/16/2020 11:46 AM   Modules accepted: Orders

## 2020-09-16 NOTE — Patient Instructions (Signed)
Medication Instructions:  Your physician recommends that you continue on your current medications as directed. Please refer to the Current Medication list given to you today.  *If you need a refill on your cardiac medications before your next appointment, please call your pharmacy*   Lab Work: TODAY: CMET and fasting lipids If you have labs (blood work) drawn today and your tests are completely normal, you will receive your results only by: [name] MyChart Message (if you have MyChart) OR . A paper copy in the mail If you have any lab test that is abnormal or we need to change your treatment, we will call you to review the results.   Follow-Up: At [facility], you and your health needs are our priority.  As part of our continuing mission to provide you with exceptional heart care, we have created designated Provider Care Teams.  These Care Teams include your primary Cardiologist (physician) and Advanced Practice Providers (APPs -  Physician Assistants and Nurse Practitioners) who all work together to provide you with the care you need, when you need it.    Your next appointment:   1 year(s)  The format for your next appointment:   In Person  Provider:   You may see Armanda Magic, MD or one of the following Advanced Practice Providers on your designated Care Team:    Ronie Spies, PA-C  Jacolyn Reedy, PA-C

## 2020-09-17 ENCOUNTER — Telehealth: Payer: Self-pay

## 2020-09-17 DIAGNOSIS — I2510 Atherosclerotic heart disease of native coronary artery without angina pectoris: Secondary | ICD-10-CM

## 2020-09-17 MED ORDER — ATORVASTATIN CALCIUM 80 MG PO TABS: 80.0000 mg | ORAL_TABLET | Freq: Every day | ORAL | 3 refills | Status: DC

## 2020-09-17 NOTE — Telephone Encounter (Signed)
-----   Message from Quintella Reichert, MD sent at 09/16/2020 10:04 PM EST ----- LDL not at goal - increase Lipitor to 80mg  daily and repeat FLP and ALT in 6 weeks

## 2020-09-17 NOTE — Telephone Encounter (Signed)
The patient has been notified of the result and verbalized understanding.  All questions (if any) were answered. Theresia Majors, RN 09/17/2020 8:16 AM  Patient will increase atorvastatin to 80 mg daily. She states that she is going to her PCP in April and will see if they will do lab work there. If not she will let us know and she can come have FLP/ALT rechecked here.

## 2020-09-23 ENCOUNTER — Ambulatory Visit: Payer: Medicare Other

## 2020-09-23 ENCOUNTER — Ambulatory Visit
Admission: RE | Admit: 2020-09-23 | Discharge: 2020-09-23 | Disposition: A | Discharge status: Home / Self Care | Payer: Medicare Other | Source: Ambulatory Visit | Attending: Family Medicine | Admitting: Family Medicine

## 2020-09-23 ENCOUNTER — Other Ambulatory Visit: Payer: Self-pay

## 2020-09-23 DIAGNOSIS — R922 Inconclusive mammogram: Secondary | ICD-10-CM | POA: Diagnosis not present

## 2020-09-23 DIAGNOSIS — R928 Other abnormal and inconclusive findings on diagnostic imaging of breast: Secondary | ICD-10-CM

## 2020-09-23 DIAGNOSIS — N6489 Other specified disorders of breast: Secondary | ICD-10-CM | POA: Diagnosis not present

## 2020-10-10 DIAGNOSIS — H35363 Drusen (degenerative) of macula, bilateral: Secondary | ICD-10-CM | POA: Diagnosis not present

## 2020-10-10 DIAGNOSIS — H04123 Dry eye syndrome of bilateral lacrimal glands: Secondary | ICD-10-CM | POA: Diagnosis not present

## 2020-10-10 DIAGNOSIS — H524 Presbyopia: Secondary | ICD-10-CM | POA: Diagnosis not present

## 2020-10-10 DIAGNOSIS — H53143 Visual discomfort, bilateral: Secondary | ICD-10-CM | POA: Diagnosis not present

## 2020-10-10 DIAGNOSIS — H179 Unspecified corneal scar and opacity: Secondary | ICD-10-CM | POA: Diagnosis not present

## 2020-10-17 DIAGNOSIS — I10 Essential (primary) hypertension: Secondary | ICD-10-CM | POA: Diagnosis not present

## 2020-10-17 DIAGNOSIS — I110 Hypertensive heart disease with heart failure: Secondary | ICD-10-CM | POA: Diagnosis not present

## 2020-10-17 DIAGNOSIS — I671 Cerebral aneurysm, nonruptured: Secondary | ICD-10-CM | POA: Diagnosis not present

## 2020-10-17 DIAGNOSIS — I509 Heart failure, unspecified: Secondary | ICD-10-CM | POA: Diagnosis not present

## 2020-10-17 DIAGNOSIS — I340 Nonrheumatic mitral (valve) insufficiency: Secondary | ICD-10-CM | POA: Diagnosis not present

## 2020-10-17 DIAGNOSIS — Z89611 Acquired absence of right leg above knee: Secondary | ICD-10-CM | POA: Diagnosis not present

## 2020-10-17 DIAGNOSIS — I5181 Takotsubo syndrome: Secondary | ICD-10-CM | POA: Diagnosis not present

## 2020-10-17 DIAGNOSIS — E7800 Pure hypercholesterolemia, unspecified: Secondary | ICD-10-CM | POA: Diagnosis not present

## 2020-10-17 DIAGNOSIS — I2510 Atherosclerotic heart disease of native coronary artery without angina pectoris: Secondary | ICD-10-CM | POA: Diagnosis not present

## 2020-10-17 DIAGNOSIS — I739 Peripheral vascular disease, unspecified: Secondary | ICD-10-CM | POA: Diagnosis not present

## 2020-10-17 DIAGNOSIS — I720 Aneurysm of carotid artery: Secondary | ICD-10-CM | POA: Diagnosis not present

## 2020-11-14 ENCOUNTER — Other Ambulatory Visit: Payer: Self-pay

## 2020-11-14 MED ORDER — ATORVASTATIN CALCIUM 80 MG PO TABS: 80.0000 mg | ORAL_TABLET | Freq: Every day | ORAL | 3 refills | Status: DC

## 2020-11-14 NOTE — Telephone Encounter (Signed)
Pt's medication was sent to pt's pharmacy as requested. Confirmation received.  °

## 2020-11-15 ENCOUNTER — Telehealth: Payer: Self-pay | Admitting: Cardiology

## 2020-11-15 NOTE — Telephone Encounter (Signed)
    Pt c/o medication issue:  1. Name of Medication:   atorvastatin (LIPITOR) 80 MG tablet    2. How are you currently taking this medication (dosage and times per day)? Take 1 tablet (80 mg total) by mouth daily.  3. Are you having a reaction (difficulty breathing--STAT)?   4. What is your medication issue? Pt would like to verify dosage of this medication, she said she's been taking 40 mg twice a day not 80 mg

## 2020-11-15 NOTE — Telephone Encounter (Signed)
Called patient in regards to atorvastatin.  She expresses that she takes atorvastatin 40 mg twice a day.  One (40mg  tablet) for Dr. and one(40 mg tablet) for Dr. Valentina Lucks.  She c/o pharmacy giving her 80 mg tablets.  I explained to her that she did not need to take medication twice a day.  She can take 80 mg once daily.  She still did not understand directions so fellow nurse spoke with her.  She then verbalized understanding to take atorvastatin 80 mg PO QD.  She refuses to come in for follow up lab work.  The importance of f/u labs was explained to her multiple times. She was made aware that we need to monitor her liver function.  She reports she is not getting stuck until her lab work with Dr. Mayford Knife in July.  She will have Dr. August send results to Dr. Valentina Lucks.  I advised her that a message would be sent to Dr.Turner's nurse with these details and she may want patient to come in for labs prior to July.  She continues to say no.

## 2021-01-21 DIAGNOSIS — E7800 Pure hypercholesterolemia, unspecified: Secondary | ICD-10-CM | POA: Diagnosis not present

## 2021-03-21 ENCOUNTER — Other Ambulatory Visit: Payer: Self-pay | Admitting: Internal Medicine

## 2021-03-21 ENCOUNTER — Ambulatory Visit (HOSPITAL_COMMUNITY)
Admission: RE | Admit: 2021-03-21 | Discharge: 2021-03-21 | Disposition: A | Discharge status: Home / Self Care | Payer: Medicare Other | Source: Ambulatory Visit | Attending: Internal Medicine | Admitting: Internal Medicine

## 2021-03-21 ENCOUNTER — Other Ambulatory Visit: Payer: Self-pay

## 2021-03-21 ENCOUNTER — Other Ambulatory Visit (HOSPITAL_COMMUNITY): Payer: Self-pay | Admitting: Internal Medicine

## 2021-03-21 DIAGNOSIS — R29898 Other symptoms and signs involving the musculoskeletal system: Secondary | ICD-10-CM | POA: Diagnosis not present

## 2021-03-21 DIAGNOSIS — I671 Cerebral aneurysm, nonruptured: Secondary | ICD-10-CM | POA: Diagnosis not present

## 2021-03-21 DIAGNOSIS — R200 Anesthesia of skin: Secondary | ICD-10-CM | POA: Insufficient documentation

## 2021-03-21 DIAGNOSIS — I10 Essential (primary) hypertension: Secondary | ICD-10-CM | POA: Diagnosis not present

## 2021-03-31 DIAGNOSIS — R2990 Unspecified symptoms and signs involving the nervous system: Secondary | ICD-10-CM | POA: Diagnosis not present

## 2021-03-31 DIAGNOSIS — I10 Essential (primary) hypertension: Secondary | ICD-10-CM | POA: Diagnosis not present

## 2021-04-02 ENCOUNTER — Ambulatory Visit: Admit: 2021-04-02 | Payer: MEDICARE | Attending: Internal Medicine | Primary: Internal Medicine

## 2021-04-02 ENCOUNTER — Ambulatory Visit: Attending: Internal Medicine | Primary: Internal Medicine

## 2021-04-02 DIAGNOSIS — Z0000 Encounter for general adult medical examination without abnormal findings: Secondary | ICD-10-CM

## 2021-04-02 LAB — AMB POC URINALYSIS DIP STICK AUTO W/O MICRO
Bilirubin (UA POC): NEGATIVE
Bilirubin, Urine, POC: NEGATIVE
Blood (UA POC): NEGATIVE
Blood (UA POC): NEGATIVE
Glucose (UA POC): NEGATIVE
Glucose, Urine, POC: NEGATIVE
Ketones (UA POC): NEGATIVE
Ketones, Urine, POC: NEGATIVE
Nitrite, Urine, POC: NEGATIVE
Nitrites (UA POC): NEGATIVE
Specific Gravity, Urine, POC: 1.025 NA (ref 1.001–1.035)
Specific gravity (UA POC): 1.025 (ref 1.001–1.035)
Urobilinogen (UA POC): 0.2 (ref 0.2–1)
Urobilinogen, POC: 0.2 (ref 0.2–1)
pH (UA POC): 5.5 (ref 4.6–8.0)
pH, Urine, POC: 5.5 NA (ref 4.6–8.0)

## 2021-04-02 MED ORDER — TRIAMCINOLONE ACETONIDE 0.1 % TOPICAL CREAM
0.1 % | Freq: Two times a day (BID) | CUTANEOUS | 3 refills | Status: AC | PRN
Start: 2021-04-02 — End: ?

## 2021-04-02 NOTE — Progress Notes (Signed)
This is a Subsequent Medicare Annual Wellness Visit providing Personalized Prevention Plan Services (PPPS) (Performed 12 months after initial AWV and PPPS )    I have reviewed the patient's medical history in detail and updated the computerized patient record.     Hypertension  Chlorthalidone is $80, but she is ok to pay for now.    Taking Micardis as well.  Hypertension ROS: taking medications as instructed, no medication side effects noted, no TIA's, no chest pain on exertion, no dyspnea on exertion, no swelling of ankles     reports that she has never smoked. She has never used smokeless tobacco.    reports no history of alcohol use.   BP Readings from Last 2 Encounters:   04/02/21 135/70   07/12/19 134/77     Reports intermittent leg cramps.  They are relatively significant at times.  Wakes up sometimes at night.  She admits she does not stretch often.  Tries to keep hydrated.    Seeing Dr. Eulas Post for endometrial cancer every 3 months for 2 years.    No sign of recurrence.      Mild urinary frequency.    Denies any urgency, dysuria.  History of radiation therapy.  No current hematuria.  Urinalysis today appears normal with trace leukocytes only.    Hyperlipidemia   taking no medication for this.  No new myalgias, no joint pains, no weakness  No TIA's, no chest pain on exertion, no dyspnea on exertion, no swelling of ankles.   Lab Results   Component Value Date/Time    Cholesterol, total 267 (H) 07/12/2019 12:28 PM    HDL Cholesterol 72 07/12/2019 12:28 PM    LDL, calculated 158.6 (H) 07/12/2019 12:28 PM    VLDL, calculated 36.4 07/12/2019 12:28 PM    Triglyceride 182 (H) 07/12/2019 12:28 PM    CHOL/HDL Ratio 3.7 07/12/2019 12:28 PM         History     Past Medical History:   Diagnosis Date    Endometrial carcinoma ([place]) 02/01/2019    Dr. Nilda Simmer. s/p XRT, chemo.  grade 2 endometrioid.  CT 06/2019 showed no mets.    Essential hypertension     Hematuria 01/2010    Dr. York Cerise, due to catheter, blood clot. [place] nl  2012    Knee pain     L TKR 01/2010.  Dr. Stacie Acres    Mixed hyperlipidemia     Nausea & vomiting     PONV    Osteopenia 01/2016    Pap smear for cervical cancer screening 12/26/2018    Negative, HPV Negative    Spider varicose veins     Urinary incontinence 2020    after XRT, TAH    Vitamin D deficiency 02/2012       Past Surgical History:   Procedure Laterality Date    COLONOSCOPY  01/2010    normal    HX KNEE ARTHROSCOPY      x4    HX KNEE REPLACEMENT  2012    left knee replacement, Dr. Barbaraann Boys OTHER SURGICAL  01/2019    D&C uterus    HX TAH AND BSO  02/13/2019    Dr. Eulas Post       Current Outpatient Medications   Medication Sig    naproxen sodium (Aleve) 220 mg cap Take  by mouth as needed.    triamcinolone acetonide (KENALOG) 0.1 % topical cream Apply  to affected area two (2) times daily as  needed for Skin Irritation. For rash from plants    telmisartan (MICARDIS) 80 mg tablet TAKE 1 TABLET BY MOUTH DAILY    chlorthalidone (HYGROTON) 25 mg tablet TAKE 1 TABLET BY MOUTH DAILY FOR BLOOD PRESSURE    Cholecalciferol, Vitamin D3, (VITAMIN D3) 2,000 unit cap capsule Take 2,000 Units by mouth daily.    multivit-min-iron-FA-lutein (CENTRUM SILVER WOMEN) 8 mg iron-400 mcg-300 mcg tab Take 1 daily     No current facility-administered medications for this visit.       Allergies   Allergen Reactions    Codeine Nausea Only    Lisinopril Other (comments)     Facial flushing    Tylenol-Codeine #2 Nausea and Vomiting       Family History   Problem Relation Age of Onset    Hypertension Mother     Kidney Disease Mother     Heart Disease Mother         afib    Hypertension Brother     Heart Disease Brother     Lung Disease Father         emphysema    Lung Disease Brother     Diabetes Neg Hx     Cancer Neg Hx         reports that she has never smoked. She has never used smokeless tobacco.   reports no history of alcohol use.      Depression Risk Factor Screening:       Alcohol Risk Factor Screening:   On any occasion during the past 3  months, have you had more than 3 drinks containing alcohol?  No    Do you average more than 14 drinks per week?  No      Functional Ability and Level of Safety:     Hearing Loss   mild    Activities of Daily Living   Self-care.   Requires assistance with: no ADLs    Fall Risk     Fall Risk Assessment, last 12 mths 04/02/2021   Able to walk? Yes   Fall in past 12 months? 0   Do you feel unsteady? 0   Are you worried about falling 0         Abuse Screen   Patient is not abused    Review of Systems   A comprehensive review of systems was negative except for that written in the HPI.    Physical Examination     Evaluation of Cognitive Function:  Mood/affect:  neutral, happy  Appearance: age appropriate  Family member/caregiver input: none    Blood pressure 135/70, pulse 86, temperature 97.9 ??F (36.6 ??C), temperature source Oral, resp. rate 14, height 5\' 4"  (1.626 m), weight 184 lb 6.4 oz (83.6 kg), SpO2 97 %.  General appearance: alert, cooperative, no distress, appears stated age  Neck: supple, symmetrical, trachea midline, no adenopathy, thyroid: not enlarged, symmetric, no tenderness/mass/nodules, no carotid bruit and no JVD  Lungs: clear to auscultation bilaterally  Heart: regular rate and rhythm, S1, S2 normal, no murmur, click, rub or gallop  Extremities: extremities normal, atraumatic, no cyanosis or edema    Patient Care Team:  Demarkis Gheen, Balinda Quails, MD as PCP - General (Internal Medicine Physician)  Shanda Howells, Balinda Quails, MD as PCP - [facility] Empaneled Provider  Eustaquio Boyden, MD (Urology)      Advice/Referrals/Counseling   Education and counseling provided.   See below for specific orders    Assessment/Plan     ACP reviewed.  Primary medical decision maker reviewed with patient and updated in chart.    she is otherwise up-to-date or have declined preventative services except for those ordered below.  Diagnoses and all orders for this visit:    1. Medicare annual wellness visit, subsequent  Recommend COVID-19 booster and seasonal  influenza vaccine this fall.    2. Frequent urination  Mildly symptomatic.  Urinalysis shows trace leukocytes.  Not enough urine to culture.  Return to clinic if worsening.  -     AMB POC URINALYSIS DIP STICK AUTO W/O MICRO  -     CULTURE, URINE; Future    3. Leg cramps  Uncontrolled.  Taking chlorthalidone.  Check sodium and potassium levels.  Check magnesium.  Increase hydration and stretch.  -     METABOLIC PANEL, COMPREHENSIVE; Future  -     MAGNESIUM; Future    4. Primary hypertension  Blood pressure is reasonably controlled on telmisartan and chlorthalidone.  Check electrolytes and continue current medication  -     METABOLIC PANEL, COMPREHENSIVE; Future  -     CBC W/O DIFF; Future    5. Contact dermatitis due to plant  Intermittent symptoms.  Refilled steroid cream as needed.  -     triamcinolone acetonide (KENALOG) 0.1 % topical cream; Apply  to affected area two (2) times daily as needed for Skin Irritation. For rash from plants    6. Endometrial carcinoma ([place])  This condition appears to be stable and is being evaluated and managed by her specialist Dr Eulas Post.   No acute findings today warrant change in management plan.      7. Mixed hyperlipidemia  The patient is asked to make an attempt to improve diet and exercise patterns to aid in medical management of this problem.  -     METABOLIC PANEL, COMPREHENSIVE; Future  -     LIPID PANEL; Future    8. Vitamin D deficiency  Unclear status on Vit D  -     VITAMIN D, 25 HYDROXY; Future  .    Potential medication side effects were discussed with the patient; let me know if any occur.  Return for yearly Annual Wellness Visits

## 2021-04-03 LAB — COMPREHENSIVE METABOLIC PANEL
ALT: 16 U/L (ref 12–78)
AST: 10 U/L — ABNORMAL LOW (ref 15–37)
Albumin/Globulin Ratio: 1.3 (ref 1.1–2.2)
Albumin: 3.7 g/dL (ref 3.5–5.0)
Alkaline Phosphatase: 90 U/L (ref 45–117)
Anion Gap: 6 mmol/L (ref 5–15)
BUN: 20 MG/DL (ref 6–20)
Bun/Cre Ratio: 17 (ref 12–20)
CO2: 29 mmol/L (ref 21–32)
Calcium: 9.3 MG/DL (ref 8.5–10.1)
Chloride: 104 mmol/L (ref 97–108)
Creatinine: 1.16 MG/DL — ABNORMAL HIGH (ref 0.55–1.02)
EGFR IF NonAfrican American: 45 mL/min/{1.73_m2} — ABNORMAL LOW (ref >60)
GFR African American: 54 mL/min/{1.73_m2} — ABNORMAL LOW (ref >60)
Globulin: 2.9 g/dL (ref 2.0–4.0)
Glucose: 108 mg/dL — ABNORMAL HIGH (ref 65–100)
Potassium: 4 mmol/L (ref 3.5–5.1)
Sodium: 139 mmol/L (ref 136–145)
Total Bilirubin: 0.4 MG/DL (ref 0.2–1.0)
Total Protein: 6.6 g/dL (ref 6.4–8.2)

## 2021-04-03 LAB — LIPID PANEL
CHOL/HDL Ratio: 4.1 (ref 0.0–5.0)
Chol/HDL Ratio: 4.1 (ref 0.0–5.0)
Cholesterol, Total: 257 MG/DL — ABNORMAL HIGH (ref <200)
Cholesterol, total: 257 MG/DL — ABNORMAL HIGH (ref <200)
HDL Cholesterol: 62 MG/DL
HDL: 62 MG/DL
LDL Calculated: 161.6 MG/DL — ABNORMAL HIGH (ref 0–100)
LDL, calculated: 161.6 MG/DL — ABNORMAL HIGH (ref 0–100)
Triglyceride: 167 MG/DL — ABNORMAL HIGH (ref <150)
Triglycerides: 167 MG/DL — ABNORMAL HIGH (ref <150)
VLDL Cholesterol Calculated: 33.4 MG/DL
VLDL, calculated: 33.4 MG/DL

## 2021-04-03 LAB — MAGNESIUM
Magnesium: 1.9 mg/dL (ref 1.6–2.4)
Magnesium: 1.9 mg/dL (ref 1.6–2.4)

## 2021-04-03 LAB — CBC
Hematocrit: 43.3 % (ref 35.0–47.0)
Hemoglobin: 13.8 g/dL (ref 11.5–16.0)
MCH: 28.8 PG (ref 26.0–34.0)
MCHC: 31.9 g/dL (ref 30.0–36.5)
MCV: 90.4 FL (ref 80.0–99.0)
MPV: 11.2 FL (ref 8.9–12.9)
NRBC Absolute: 0 10*3/uL (ref 0.00–0.01)
Nucleated RBCs: 0 PER 100 WBC
Platelets: 222 10*3/uL (ref 150–400)
RBC: 4.79 M/uL (ref 3.80–5.20)
RDW: 12.7 % (ref 11.5–14.5)
WBC: 7.1 10*3/uL (ref 3.6–11.0)

## 2021-04-03 LAB — VITAMIN D 25 HYDROXY: Vit D, 25-Hydroxy: 27.4 ng/mL — ABNORMAL LOW (ref 30–100)

## 2021-04-03 LAB — METABOLIC PANEL, COMPREHENSIVE
A-G Ratio: 1.3 (ref 1.1–2.2)
ALT (SGPT): 16 U/L (ref 12–78)
AST (SGOT): 10 U/L — ABNORMAL LOW (ref 15–37)
Albumin: 3.7 g/dL (ref 3.5–5.0)
Alk. phosphatase: 90 U/L (ref 45–117)
Anion gap: 6 mmol/L (ref 5–15)
BUN/Creatinine ratio: 17 (ref 12–20)
BUN: 20 MG/DL (ref 6–20)
Bilirubin, total: 0.4 MG/DL (ref 0.2–1.0)
CO2: 29 mmol/L (ref 21–32)
Calcium: 9.3 MG/DL (ref 8.5–10.1)
Chloride: 104 mmol/L (ref 97–108)
Creatinine: 1.16 MG/DL — ABNORMAL HIGH (ref 0.55–1.02)
GFR est AA: 54 mL/min/{1.73_m2} — ABNORMAL LOW (ref >60)
GFR est non-AA: 45 mL/min/{1.73_m2} — ABNORMAL LOW (ref >60)
Globulin: 2.9 g/dL (ref 2.0–4.0)
Glucose: 108 mg/dL — ABNORMAL HIGH (ref 65–100)
Potassium: 4 mmol/L (ref 3.5–5.1)
Protein, total: 6.6 g/dL (ref 6.4–8.2)
Sodium: 139 mmol/L (ref 136–145)

## 2021-04-03 LAB — CBC W/O DIFF
ABSOLUTE NRBC: 0 10*3/uL (ref 0.00–0.01)
HCT: 43.3 % (ref 35.0–47.0)
HGB: 13.8 g/dL (ref 11.5–16.0)
MCH: 28.8 PG (ref 26.0–34.0)
MCHC: 31.9 g/dL (ref 30.0–36.5)
MCV: 90.4 FL (ref 80.0–99.0)
MPV: 11.2 FL (ref 8.9–12.9)
NRBC: 0 PER 100 WBC
PLATELET: 222 10*3/uL (ref 150–400)
RBC: 4.79 M/uL (ref 3.80–5.20)
RDW: 12.7 % (ref 11.5–14.5)
WBC: 7.1 10*3/uL (ref 3.6–11.0)

## 2021-04-03 LAB — VITAMIN D, 25 HYDROXY: Vitamin D 25-Hydroxy: 27.4 ng/mL — ABNORMAL LOW (ref 30–100)

## 2021-05-02 ENCOUNTER — Telehealth: Payer: Self-pay | Admitting: Cardiology

## 2021-05-02 ENCOUNTER — Other Ambulatory Visit: Payer: Self-pay | Admitting: Family Medicine

## 2021-05-02 DIAGNOSIS — Z23 Encounter for immunization: Secondary | ICD-10-CM | POA: Diagnosis not present

## 2021-05-02 DIAGNOSIS — J4520 Mild intermittent asthma, uncomplicated: Secondary | ICD-10-CM | POA: Diagnosis not present

## 2021-05-02 DIAGNOSIS — K219 Gastro-esophageal reflux disease without esophagitis: Secondary | ICD-10-CM | POA: Diagnosis not present

## 2021-05-02 DIAGNOSIS — I110 Hypertensive heart disease with heart failure: Secondary | ICD-10-CM | POA: Diagnosis not present

## 2021-05-02 DIAGNOSIS — Z0000 Encounter for general adult medical examination without abnormal findings: Secondary | ICD-10-CM | POA: Diagnosis not present

## 2021-05-02 DIAGNOSIS — I10 Essential (primary) hypertension: Secondary | ICD-10-CM | POA: Diagnosis not present

## 2021-05-02 DIAGNOSIS — H9190 Unspecified hearing loss, unspecified ear: Secondary | ICD-10-CM | POA: Diagnosis not present

## 2021-05-02 DIAGNOSIS — Z1231 Encounter for screening mammogram for malignant neoplasm of breast: Secondary | ICD-10-CM

## 2021-05-02 DIAGNOSIS — M8580 Other specified disorders of bone density and structure, unspecified site: Secondary | ICD-10-CM

## 2021-05-02 DIAGNOSIS — I509 Heart failure, unspecified: Secondary | ICD-10-CM | POA: Diagnosis not present

## 2021-05-02 DIAGNOSIS — E7800 Pure hypercholesterolemia, unspecified: Secondary | ICD-10-CM | POA: Diagnosis not present

## 2021-05-02 DIAGNOSIS — Z1389 Encounter for screening for other disorder: Secondary | ICD-10-CM | POA: Diagnosis not present

## 2021-05-02 DIAGNOSIS — I2510 Atherosclerotic heart disease of native coronary artery without angina pectoris: Secondary | ICD-10-CM | POA: Diagnosis not present

## 2021-05-02 NOTE — Telephone Encounter (Signed)
[facility] called to schedule the patient for an urgent appt due to patient experiencing angina symptoms. Sent call to triage

## 2021-05-02 NOTE — Telephone Encounter (Signed)
Spoke with nurse at Dr. Jone Baseman office who states that the patient is in the office today seeing Dr. Valentina Lucks. The patient is complaining of increased angina. She denies any other symptoms. Dr. Valentina Lucks is requesting that the patient be seen by cardiology next week. Patient was scheduled for an appointment next week. Advised the nurse to have the patient call us if she develops any further symptoms or symptoms worsen.

## 2021-05-05 DIAGNOSIS — K219 Gastro-esophageal reflux disease without esophagitis: Secondary | ICD-10-CM | POA: Diagnosis not present

## 2021-05-05 DIAGNOSIS — E7800 Pure hypercholesterolemia, unspecified: Secondary | ICD-10-CM | POA: Diagnosis not present

## 2021-05-08 ENCOUNTER — Other Ambulatory Visit: Payer: Self-pay

## 2021-05-08 ENCOUNTER — Ambulatory Visit (INDEPENDENT_AMBULATORY_CARE_PROVIDER_SITE_OTHER): Payer: Medicare Other | Admitting: Cardiology

## 2021-05-08 ENCOUNTER — Encounter: Payer: Self-pay | Admitting: Cardiology

## 2021-05-08 VITALS — BP 134/54 | HR 81 | Ht 62.0 in | Wt 136.0 lb

## 2021-05-08 DIAGNOSIS — R0989 Other specified symptoms and signs involving the circulatory and respiratory systems: Secondary | ICD-10-CM | POA: Diagnosis not present

## 2021-05-08 DIAGNOSIS — I110 Hypertensive heart disease with heart failure: Secondary | ICD-10-CM

## 2021-05-08 DIAGNOSIS — I350 Nonrheumatic aortic (valve) stenosis: Secondary | ICD-10-CM | POA: Diagnosis not present

## 2021-05-08 DIAGNOSIS — I5032 Chronic diastolic (congestive) heart failure: Secondary | ICD-10-CM

## 2021-05-08 DIAGNOSIS — I2510 Atherosclerotic heart disease of native coronary artery without angina pectoris: Secondary | ICD-10-CM

## 2021-05-08 DIAGNOSIS — E785 Hyperlipidemia, unspecified: Secondary | ICD-10-CM | POA: Diagnosis not present

## 2021-05-08 DIAGNOSIS — I5181 Takotsubo syndrome: Secondary | ICD-10-CM

## 2021-05-08 MED ORDER — HYDROCHLOROTHIAZIDE 12.5 MG PO TABS: 12.5000 mg | ORAL_TABLET | Freq: Every day | ORAL | 3 refills | Status: DC

## 2021-05-08 MED ORDER — CARVEDILOL 12.5 MG PO TABS: 12.5000 mg | ORAL_TABLET | Freq: Two times a day (BID) | ORAL | 3 refills | Status: DC

## 2021-05-08 MED ORDER — PANTOPRAZOLE SODIUM 40 MG PO TBEC: 40.0000 mg | DELAYED_RELEASE_TABLET | Freq: Every day | ORAL | 3 refills | Status: DC

## 2021-05-08 MED ORDER — LOSARTAN POTASSIUM 25 MG PO TABS: 25.0000 mg | ORAL_TABLET | Freq: Every day | ORAL | 3 refills | Status: DC

## 2021-05-08 MED ORDER — AMLODIPINE BESYLATE 2.5 MG PO TABS: 2.5000 mg | ORAL_TABLET | Freq: Every day | ORAL | 3 refills | Status: DC

## 2021-05-08 MED ORDER — ATORVASTATIN CALCIUM 80 MG PO TABS: 80.0000 mg | ORAL_TABLET | Freq: Every day | ORAL | 3 refills | Status: DC

## 2021-05-08 MED ORDER — EZETIMIBE 10 MG PO TABS: 10.0000 mg | ORAL_TABLET | Freq: Every day | ORAL | 3 refills | Status: DC

## 2021-05-08 NOTE — Addendum Note (Signed)
Addended by: Cleda Mccreedy on: 05/08/2021 11:06 AM   Modules accepted: Orders

## 2021-05-08 NOTE — Progress Notes (Addendum)
Cardiology Office Note:    Date:  05/08/2021   ID:  [name], DOB 1940-07-28, MRN [id number]  PCP:  Maurice Small, MD  Cardiologist:  Armanda Magic, MD    Referring MD: Maurice Small, MD   Chief Complaint  Patient presents with   Coronary Artery Disease   Hypertension   Cardiomyopathy   Aortic Stenosis   Hyperlipidemia     History of Present Illness:    [name] is a 80 y.o. female with a hx of CAD 2nd to NSTEMI in 05/2016 felt due to Takotsubo CM, hypertensive heart diseae/atypical HOCM, mild aortic stenosis, posterior noncommunicating artery aneurysm (followed by Dr. 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    Cardiac cath 04/21/16 revealed 25 mCx, 75% ostial D1, LVEF 35-45% in pattern of Takotsubo CM, and a 55 mmHg LVOT gradient felt due to subvalvular stenosis. Echo 05/2016 showed EF 60-65%, grade 1 DD, moderate focal hypertrophy of the basilar septum with a narrowed LV outflow tract and mild SAM. There was minimal LVOT gradient of , felt possibly HOCM variant. She underwent cardiac MRI 07/2016 showing moderate basal septal hypertrophy not classic for HOCM morphology with no delayed gadolium uptake to suggest myofibrillar disarray, + SAM with small LVOT gradient in regard to physiology and mild MR, mild LAE, no delayed gad uptake. Patient has longstanding history of atypical chest discomfort.  Poor historian.  Last stress test August 2019 and 11/2019 were low risk.    She is here today for followup.  She continues to have chronic chest pain that is sporadic and not everyday.  It is nonexertional and is mid sternal with some radiation into her left arm.  She will get nauseated with the pain.  She says it lasts 20 minutes and resolves on its own.  It still resolves after she belches a lot. She denies any SOB, DOE, PND, orthopnea, dizziness, palpitations or syncope. She has chronic LE edema that she thinks has  gotten worse.  She is VERY NONCOMPLIANT compliant with meds and stopped taking her carvedilol, amlodipine, Protonix and Zetia  Past Medical History:  Diagnosis Date   CAD in native artery    a. 04/2016 NSTEMI with cath showing 25% MCx, 75% DX1 lesion and moderate LVD 35-45% out of proportion to CAD and in a pattern of Takotsubo CM (no clear stressor)   Dyslipidemia    GERD (gastroesophageal reflux disease)    Hypercholesteremia    Hypertension    Hypertensive heart disease    initially she was thought to have a HOCM variant by cath with increased LVOT gradient but echo showed only a gradient across the LVOT and SAM and cardiac MRI did not show any late gad enhancement and only a LVOT gradient with 83mm BSH.  There was some mild SAM and mild MR.  EF normal at 76%.     LVH (left ventricular hypertrophy)    a. cMRI 2018 - showing moderate Basal septal hypertrophy not classic for HOCM morphology with no delayed gadolium uptake to suggest myofibrillar disarray, + SAM with small LVOT gradient in regard to physiology and mild MR.   Mild aortic stenosis    NSTEMI (non-ST elevated myocardial infarction) (HCC) 04/2016   secondary to stress CM - Takotsubo CM   PAC (premature atrial contraction)    Event monitors 10/2013 and 08/2014 showed NSR, ST, PACs, PVCs.    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   CARDIAC CATHETERIZATION N/A 04/21/2016   Procedure: Left Heart Cath and Coronary Angiography;  Surgeon: Corky Crafts, MD;  Location: [facility]  INVASIVE CV LAB;  Service: Cardiovascular;  Laterality: N/A;   IR GENERIC HISTORICAL  02/13/2016   IR RADIOLOGY PERIPHERAL GUIDED IV START 02/13/2016 Berdine Dance, MD [facility]   IR GENERIC HISTORICAL  02/13/2016   IR US GUIDE VASC ACCESS LEFT 02/13/2016 Berdine Dance, MD [facility]    Current Medications: Current Meds  Medication Sig   acetaminophen (TYLENOL) 325 MG tablet Take 2 tablets (650 mg total) by mouth every 4 (four) hours as needed for headache or mild pain.   aspirin EC 81 MG EC tablet Take 1 tablet (81 mg total) by mouth daily.   atorvastatin (LIPITOR) 80 MG tablet Take 1 tablet (80 mg total) by mouth daily.   Calcium Carbonate-Vitamin D (CALCIUM-D PO) Take 1 tablet by mouth daily.   COD LIVER OIL PO Take 1 capsule by mouth daily.   fluticasone (FLONASE) 50 MCG/ACT nasal spray Place 1 spray into both nostrils daily as needed (congestion).    fluticasone (FLOVENT HFA) 110 MCG/ACT inhaler Inhale 1 puff into the lungs as needed.    gabapentin (NEURONTIN) 300 MG capsule Take 300 mg by mouth 3 (three) times daily.   hydrochlorothiazide (HYDRODIURIL) 12.5 MG tablet Take 12.5 mg by mouth daily.   losartan (COZAAR) 100 MG tablet Take 100 mg by mouth daily.   Multiple Vitamin (MULTIVITAMIN WITH MINERALS) TABS tablet Take 1 tablet by mouth daily.   Omega-3 Fatty Acids (FISH OIL PO) Take 1 capsule by mouth daily at 2 PM.      Allergies:   Other, Ace inhibitors, Codeine, and Penicillins   Social History   Socioeconomic History   Marital status: Single    Spouse name: Not on file   Number of children: Not on file   Years of education: Not on file   Highest education level: Not on file  Occupational History   Not on file  Tobacco Use   Smoking status: Former    Types: Cigarettes    Quit date: 07/13/2004    Years since quitting: 16.8   Smokeless tobacco: Never  Vaping Use   Vaping Use: Never used  Substance and Sexual Activity   Alcohol use: Yes    Comment: ocassionally     Drug use: No   Sexual activity: Not on file  Other Topics Concern   Not on file  Social History Narrative   Not on file   Social Determinants of Health   Financial Resource Strain: Not on file  Food Insecurity: Not on file  Transportation Needs: Not on file  Physical Activity: Not on file  Stress: Not on file  Social Connections: Not on file     Family History: The patient's family history includes Heart attack in her brother, mother, and sister; Heart disease in her brother, mother, and sister.  ROS:   Please see the history of present illness.  ROS  All other systems reviewed and negative.   EKGs/Labs/Other Studies Reviewed:    The following studies were reviewed today: EKG  Steffanie Dunn 11/2019 Study Highlights  Nuclear stress EF: 63%. There was no ST segment deviation noted during stress. The study is normal. This is a low risk study. The left ventricular ejection fraction is normal (55-65%).  2D echo 11/2019 IMPRESSIONS    1. Left ventricular ejection fraction, by estimation, is 60 to 65%. The  left ventricle has normal function. The left ventricle has no regional  wall motion abnormalities. There is mild left ventricular hypertrophy of  the basal-septal segment. Left ventricular diastolic parameters are consistent with Grade I diastolic  dysfunction (impaired relaxation). Elevated left atrial pressure.   2. Right ventricular systolic function is normal. The right ventricular  size is normal. Tricuspid regurgitation signal is inadequate for assessing  PA pressure.   3. The mitral valve is normal in structure. Mild mitral valve  regurgitation. No evidence of mitral stenosis.   4. The aortic valve is normal in structure. Aortic valve regurgitation is  not visualized. No aortic stenosis is present.   5. The inferior vena cava is normal in size with greater than 50%  respiratory variability, suggesting right atrial pressure of 3 mmHg.   EKG:  EKG is  ordered today and showed NSR with LVH by voltage and nonspecific T wave abnormality  Recent Labs: 09/16/2020: ALT 12; BUN 15; Creatinine, Ser 0.90; Potassium 3.9; Sodium 142   Recent Lipid Panel    Component Value Date/Time   CHOL 154 09/16/2020 1153   TRIG 60 09/16/2020 1153   HDL 55 09/16/2020 1153   CHOLHDL 2.8 09/16/2020 1153   CHOLHDL 3.9 04/22/2016 0017   VLDL 52 (H) 04/22/2016 0017   LDLCALC 87 09/16/2020 1153    Physical Exam:    VS:  BP (!) 134/54   Pulse 81   Ht 5\' 2"  (1.575 m)   Wt 136 lb (61.7 kg)   SpO2 98%   BMI 24.87 kg/m     Wt Readings from Last 3 Encounters:  05/08/21 136 lb (61.7 kg)  09/16/20 133 lb 12.8 oz (60.7 kg)  01/09/20 142 lb (64.4 kg)    GEN: Well nourished, well developed in no acute distress HEENT: Normal NECK: No JVD; bilateral carotid bruits LYMPHATICS: No lymphadenopathy CARDIAC:RRR, no rubs, gallops.  1/6 SM at RUSB to LUSB RESPIRATORY:  Clear to auscultation without rales, wheezing or rhonchi  ABDOMEN: Soft, non-tender, non-distended MUSCULOSKELETAL:  No edema; No deformity  SKIN: Warm and dry NEUROLOGIC:  Alert and oriented x 3 PSYCHIATRIC:  Normal affect   ASSESSMENT:    1. CAD in native artery   2. Hypertensive heart disease with chronic diastolic congestive heart failure (HCC)   3. Takotsubo cardiomyopathy   4. Mild aortic stenosis   5. Dyslipidemia    PLAN:    In order of problems listed above:  1.  ASCAD - Cardiac cath 04/21/16 revealed 25 mCx, 75% ostial D1, LVEF 35-45% in pattern of Takotsubo CM, and a 55 mmHg LVOT gradient felt due to subvalvular stenosis. -she has had chronic CP for years  -she is noncompliant with meds and currently has stopped carvedilol, amlodipine, Protonix and Zetia (had avoided nitrates in the past due to ? HOCM dx in the past) -06/21/16 11/2019 showed no ischemia -I think her chest discomfort is related to GERD and likely worse due to stopping her Protonix.  >>her discomfort is  resolved with  belching and she has a lot of gas -Continue prescription drug management with aspirin 81 mg daily, statin  -restart Carvedilol 12.5mg  BID and amlodipine 2.5mg  daily - refilled today  2.  Hypertensive heart disease  -Her BP is adequately controlled on exam today -Restart carvedilol 12.5 mg twice daily and amlodipine 2.5 mg daily  -continue prescription drug management with HCTZ 12.5 mg daily  -her PCP placed her on Losartan 100mg  daily which I am going to decrease to 25mg  daily to allow for more BP to titrate cardiac meds including Carvedilol and amlodipine -I have personally reviewed and interpreted outside labs performed by patient's PCP which showed serum creatinine 0.76 on 05/05/2021 -followup with PharmD in HTN in 2 weeks  3.  Takotsubo cardiomyopathy  -LVF normal on echo 2019 at 65-70% and echo 11/2019  4.  Mild aortic stenosis  -no AS by echo 11/2019  5.  HLD -LDL goal < 70 -I have personally reviewed and interpreted outside labs performed by patient's PCP which showed LDL 67, HDL 48, total cholesterol 12/2019, triglycerides 80 on 05/05/2021 -Continue prescription drug management with Lipitor 40 mg daily-refilled today  and restart Zetia 10mg  daily  6.  Bilateral carotid artery bruits -I will get bilateral arterial dopplers done   Medication Adjustments/Labs and Tests Ordered: Current medicines are reviewed at length with the patient today.  Concerns regarding medicines are outlined above.  Orders Placed This Encounter  Procedures   EKG 12-Lead    No orders of the defined types were placed in this encounter.   Signed, 790, MD  05/08/2021 10:24 AM    [facility]

## 2021-05-08 NOTE — Addendum Note (Signed)
Addended by: Cleda Mccreedy on: 05/08/2021 11:02 AM   Modules accepted: Orders

## 2021-05-08 NOTE — Patient Instructions (Addendum)
Medication Instructions:  Your physician has recommended you make the following change in your medication:   RESTART: Amlodipine, Carvedilol, Ezetimibe, Pantoprazole DECREASE: Losartan to 25mg  daily  *If you need a refill on your cardiac medications before your next appointment, please call your pharmacy*   Lab Work: None If you have labs (blood work) drawn today and your tests are completely normal, you will receive your results only by: MyChart Message (if you have MyChart) OR A paper copy in the mail If you have any lab test that is abnormal or we need to change your treatment, we will call you to review the results.   Testing/Procedures: Your physician has requested that you have a carotid duplex. This test is an ultrasound of the carotid arteries in your neck. It looks at blood flow through these arteries that supply the brain with blood. Allow one hour for this exam. There are no restrictions or special instructions.    Follow-Up: At [facility], you and your health needs are our priority.  As part of our continuing mission to provide you with exceptional heart care, we have created designated Provider Care Teams.  These Care Teams include your primary Cardiologist (physician) and Advanced Practice Providers (APPs -  Physician Assistants and Nurse Practitioners) who all work together to provide you with the care you need, when you need it.  We recommend signing up for the patient portal called "MyChart".  Sign up information is provided on this After Visit Summary.  MyChart is used to connect with patients for Virtual Visits (Telemedicine).  Patients are able to view lab/test results, encounter notes, upcoming appointments, etc.  Non-urgent messages can be sent to your provider as well.   To learn more about what you can do with MyChart, go to [facility].    Your next appointment:   2 month(s)  The format for your next appointment:   In Person  Provider:   You  may see [email or web address], MD or one of the following Advanced Practice Providers on your designated Care Team:   Armanda Magic, PA-C Ronie Spies, PA-C

## 2021-05-12 DIAGNOSIS — I671 Cerebral aneurysm, nonruptured: Secondary | ICD-10-CM | POA: Diagnosis not present

## 2021-05-14 ENCOUNTER — Telehealth: Payer: Self-pay | Admitting: Cardiology

## 2021-05-14 DIAGNOSIS — H905 Unspecified sensorineural hearing loss: Secondary | ICD-10-CM | POA: Diagnosis not present

## 2021-05-14 NOTE — Telephone Encounter (Signed)
Pt c/o medication issue:  1. Name of Medication: pantoprazole (PROTONIX) 40 MG tablet  2. How are you currently taking this medication (dosage and times per day)? Take 1 tablet (40 mg total) by mouth daily.    3. Are you having a reaction (difficulty breathing--STAT)? yes  4. What is your medication issue? giving her diarrhea very bad

## 2021-05-14 NOTE — Telephone Encounter (Signed)
Patient reports that she has Prilosec OTC that usually works for her that she is going to start taking daily. She does not wish to see a GI doctor at this time.

## 2021-05-14 NOTE — Telephone Encounter (Signed)
Spoke with the patient who reports that Protonix is causing her to have diarrhea. She states that it is the only medication she takes in the afternoon and shortly after is when she begins to have loose stools. Advised patient to discontinue Protonix to see if diarrhea resolves.

## 2021-05-16 ENCOUNTER — Ambulatory Visit (HOSPITAL_COMMUNITY)
Admission: RE | Admit: 2021-05-16 | Discharge: 2021-05-16 | Disposition: A | Discharge status: Home / Self Care | Payer: Medicare Other | Source: Ambulatory Visit | Attending: Cardiology | Admitting: Cardiology

## 2021-05-16 ENCOUNTER — Other Ambulatory Visit: Payer: Self-pay

## 2021-05-16 DIAGNOSIS — R0989 Other specified symptoms and signs involving the circulatory and respiratory systems: Secondary | ICD-10-CM | POA: Diagnosis not present

## 2021-05-21 ENCOUNTER — Encounter: Payer: Self-pay | Admitting: Cardiology

## 2021-05-21 DIAGNOSIS — I6529 Occlusion and stenosis of unspecified carotid artery: Secondary | ICD-10-CM | POA: Insufficient documentation

## 2021-05-21 DIAGNOSIS — I720 Aneurysm of carotid artery: Secondary | ICD-10-CM | POA: Insufficient documentation

## 2021-05-23 ENCOUNTER — Ambulatory Visit: Payer: Medicare Other

## 2021-05-23 ENCOUNTER — Telehealth: Payer: Self-pay

## 2021-05-23 DIAGNOSIS — I6523 Occlusion and stenosis of bilateral carotid arteries: Secondary | ICD-10-CM

## 2021-05-23 DIAGNOSIS — I739 Peripheral vascular disease, unspecified: Secondary | ICD-10-CM

## 2021-05-23 DIAGNOSIS — R0989 Other specified symptoms and signs involving the circulatory and respiratory systems: Secondary | ICD-10-CM

## 2021-05-23 NOTE — Telephone Encounter (Signed)
-----   Message from Quintella Reichert, MD sent at 05/21/2021 11:48 AM EST ----- Carotid Doppler showed 40 to 59% stenosis of the right carotid artery and less than 50% stenosis of the right common carotid artery.  There is 1 to 39% stenosis of the left internal carotid artery and greater than 50% stenosis of the common carotid artery on the left.  There is also left subclavian artery stenosis.  Please find out if patient has had any left arm weakness numbness or pain.  Please refer to Dr. Allyson Sabal or Dr. Kirke Corin for further evaluation

## 2021-05-23 NOTE — Telephone Encounter (Signed)
The patient has been notified of the result and verbalized understanding.  All questions (if any) were answered. Theresia Majors, RN 05/23/2021 11:42 AM  Referral has been placed.

## 2021-05-28 ENCOUNTER — Encounter: Payer: Self-pay | Admitting: Cardiovascular Disease

## 2021-05-28 ENCOUNTER — Ambulatory Visit (INDEPENDENT_AMBULATORY_CARE_PROVIDER_SITE_OTHER): Payer: Medicare Other | Admitting: Cardiovascular Disease

## 2021-05-28 ENCOUNTER — Other Ambulatory Visit: Payer: Self-pay

## 2021-05-28 DIAGNOSIS — I771 Stricture of artery: Secondary | ICD-10-CM

## 2021-05-28 NOTE — Patient Instructions (Signed)
Medication Instructions:  Your physician recommends that you continue on your current medications as directed. Please refer to the Current Medication list given to you today.  *If you need a refill on your cardiac medications before your next appointment, please call your pharmacy*   Testing/Procedures: Your physician has requested that you have a carotid duplex. This test is an ultrasound of the carotid arteries in your neck. It looks at blood flow through these arteries that supply the brain with blood. Allow one hour for this exam. There are no restrictions or special instructions. To be done in November 2023.    Follow-Up: At [facility], you and your health needs are our priority.  As part of our continuing mission to provide you with exceptional heart care, we have created designated Provider Care Teams.  These Care Teams include your primary Cardiologist (physician) and Advanced Practice Providers (APPs -  Physician Assistants and Nurse Practitioners) who all work together to provide you with the care you need, when you need it.  We recommend signing up for the patient portal called "MyChart".  Sign up information is provided on this After Visit Summary.  MyChart is used to connect with patients for Virtual Visits (Telemedicine).  Patients are able to view lab/test results, encounter notes, upcoming appointments, etc.  Non-urgent messages can be sent to your provider as well.   To learn more about what you can do with MyChart, go to [email or web address].    Your next appointment:   12 month(s)  The format for your next appointment:   In Person  Provider:  Nanetta Batty, MD

## 2021-05-28 NOTE — Progress Notes (Signed)
05/28/2021 [name]   September 01, 1940  [id number]  Primary Physician Cindy Pillar, MD Primary Cardiologist: Cindy Harp MD [name], [place]  HPI:  [name] is a 80 y.o. mildly overweight divorced African-American female mother of 2, grandmother of 61 grandchildren is accompanied by her son [name] today.  She was referred by [name] for peripheral vascular valuation because of left subclavian artery stenosis demonstrated on recent carotid Dopplers performed 05/16/2021.  She does have a history of treated hypertension and hyperlipidemia.  She has had a non-STEMI in the past as well as hokum and Takotsubo syndrome.  She had what sounds like a TIA back in October with transient numbness of her left face, drooping of her left lip and numbness of her left arm which has since resolved.  Carotid Dopplers revealed moderate right ICA stenosis with moderate left subclavian artery stenosis.  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  She complains of some weakness in her left upper extremity with occasional numbness as well.   Current Meds  Medication Sig   acetaminophen (TYLENOL) 325 MG tablet Take 2 tablets (650 mg total) by mouth every 4 (four) hours as needed for headache or mild pain.   amLODipine (NORVASC) 2.5 MG tablet Take 1 tablet (2.5 mg total) by mouth daily.   aspirin EC 81 MG EC tablet Take 1 tablet (81 mg total) by mouth daily.   atorvastatin (LIPITOR) 80 MG tablet Take 1 tablet (80 mg total) by mouth daily.   Calcium Carbonate-Vitamin D (CALCIUM-D PO) Take 1 tablet by mouth daily.   carvedilol (COREG) 12.5 MG tablet Take 1 tablet (12.5 mg total) by mouth 2 (two) times daily.   COD LIVER OIL PO Take 1 capsule by mouth daily.   ezetimibe (ZETIA) 10 MG tablet Take 1 tablet (10 mg total) by mouth daily.   fluticasone (FLONASE) 50 MCG/ACT nasal spray Place 1 spray into both nostrils daily as needed (congestion).     fluticasone (FLOVENT HFA) 110 MCG/ACT inhaler Inhale 1 puff into the lungs as needed.    gabapentin (NEURONTIN) 300 MG capsule Take 300 mg by mouth 3 (three) times daily.   hydrochlorothiazide (HYDRODIURIL) 12.5 MG tablet Take 1 tablet (12.5 mg total) by mouth daily.   losartan (COZAAR) 25 MG tablet Take 1 tablet (25 mg total) by mouth daily.   Multiple Vitamin (MULTIVITAMIN WITH MINERALS) TABS tablet Take 1 tablet by mouth daily.   Omega-3 Fatty Acids (FISH OIL PO) Take 1 capsule by mouth daily at 2 PM.    omeprazole (PRILOSEC OTC) 20 MG tablet 1 tablet 30 minutes before morning meal     Allergies  Allergen Reactions   Other Other (See Comments)   Ace Inhibitors Cough and Other (See Comments)   Codeine Itching, Rash and Other (See Comments)   Penicillins Itching, Rash and Other (See Comments)    Has patient had a PCN reaction causing immediate rash, facial/tongue/throat swelling, SOB or lightheadedness with hypotension: Yes Has patient had a PCN reaction causing severe rash involving mucus membranes or skin necrosis: No Has patient had a PCN reaction that required hospitalization: Unknown Has patient had a PCN reaction occurring within the last 10 years: No If all of the above answers are "NO", then may proceed with Cephalosporin use.    Social History   Socioeconomic History   Marital status: Single    Spouse name:  Not on file   Number of children: Not on file   Years of education: Not on file   Highest education level: Not on file  Occupational History   Not on file  Tobacco Use   Smoking status: Former    Types: Cigarettes    Quit date: 07/13/2004    Years since quitting: 16.8   Smokeless tobacco: Never  Vaping Use   Vaping Use: Never used  Substance and Sexual Activity   Alcohol use: Yes    Comment: ocassionally    Drug use: No   Sexual activity: Not on file  Other Topics Concern   Not on file  Social History Narrative   Not on file   Social Determinants of  Health   Financial Resource Strain: Not on file  Food Insecurity: Not on file  Transportation Needs: Not on file  Physical Activity: Not on file  Stress: Not on file  Social Connections: Not on file  Intimate Partner Violence: Not on file     Review of Systems: General: negative for chills, fever, night sweats or weight changes.  Cardiovascular: negative for chest pain, dyspnea on exertion, edema, orthopnea, palpitations, paroxysmal nocturnal dyspnea or shortness of breath Dermatological: negative for rash Respiratory: negative for cough or wheezing Urologic: negative for hematuria Abdominal: negative for nausea, vomiting, diarrhea, bright red blood per rectum, melena, or hematemesis Neurologic: negative for visual changes, syncope, or dizziness All other systems reviewed and are otherwise negative except as noted above.    Blood pressure (!) 150/66, pulse 75, height 5\' 2"  (1.575 m), weight 140 lb 12.8 oz (63.9 kg), SpO2 96 %.  General appearance: alert and no distress Neck: no adenopathy, no JVD, supple, symmetrical, trachea midline, thyroid not enlarged, symmetric, no tenderness/mass/nodules, and bilateral carotid bruits and left subclavian  Lungs: clear to auscultation bilaterally Heart: regular rate and rhythm, S1, S2 normal, no murmur, click, rub or gallop Extremities: extremities normal, atraumatic, no cyanosis or edema Pulses: 2+ and symmetric Skin: Skin color, texture, turgor normal. No rashes or lesions Neurologic: Grossly normal  EKG not performed today so I will call  ASSESSMENT AND PLAN:   Stenosis of left subclavian artery (HCC) [name] was referred by [name] for left subclavian artery stenosis.  She had carotid Dopplers performed 05/16/2021 that showed moderate right ICA stenosis and moderate left subclavian artery stenosis.  Her upper extremity pressure differential was only 6 mmHg although she did have stenotic and turbulent flow in her left vertebral.   She does complain of occasional numbness in her left arm and she says that it is slightly weaker than the right arm although she is right-handed.  She has a loud bruit on that side.  At this point, she is minimally ambulatory with a walker status post right AKA and really does not have subclavian steal symptoms.  Given her age and comorbidities uncomfortable following her clinically at this time.     13/10/2020 MD FACP,FACC,FAHA, [facility] 05/28/2021 10:42 AM

## 2021-05-28 NOTE — Assessment & Plan Note (Signed)
[name] was referred by Dr. Mayford Knife for left subclavian artery stenosis.  She had carotid Dopplers performed 05/16/2021 that showed moderate right ICA stenosis and moderate left subclavian artery stenosis.  Her upper extremity pressure differential was only 6 mmHg although she did have stenotic and turbulent flow in her left vertebral.  She does complain of occasional numbness in her left arm and she says that it is slightly weaker than the right arm although she is right-handed.  She has a loud bruit on that side.  At this point, she is minimally ambulatory with a walker status post right AKA and really does not have subclavian steal symptoms.  Given her age and comorbidities uncomfortable following her clinically at this time.

## 2021-07-02 NOTE — Progress Notes (Signed)
Cardiology Office Note    Date:  07/15/2021   ID:  KAILEI COWENS, DOB 1941-03-10, MRN [id number]   PCP:  Maurice Small, MD   [facility]  Cardiologist:  Armanda Magic, MD   Advanced Practice Provider:  No care team member to display Electrophysiologist:  None   [phone]   Chief Complaint  Patient presents with   Follow-up    History of Present Illness:  [name] is a 80 y.o. female  with a hx of CAD  NSTEMI in 05/2016 felt due to Takotsubo CM, hypertensive heart diseae/atypical HOCM, mild aortic stenosis, posterior noncommunicating artery aneurysm (followed by Dr. 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  Cardiac cath 04/21/16 revealed 25 mCx, 75% ostial D1, LVEF 35-45% in pattern of Takotsubo CM, and a 55 mmHg LVOT gradient felt due to subvalvular stenosis. Echo 05/2016 showed EF 60-65%, grade 1 DD, moderate focal hypertrophy of the basilar septum with a narrowed LV outflow tract and mild SAM. There was minimal LVOT gradient of , felt possibly HOCM variant. She underwent cardiac MRI 07/2016 showing moderate basal septal hypertrophy not classic for HOCM morphology with no delayed gadolium uptake to suggest myofibrillar disarray, + SAM with small LVOT gradient in regard to physiology and mild MR, mild LAE, no delayed gad uptake. Patient has longstanding history of atypical chest discomfort.  Poor historian.  Last stress test August 2019 and 11/2019 were low risk.    Last saw Dr. Mayford Knife 04/2021 and having atypical chest pain, LE edema and had stopped her carvedilol, amlodipine, zetia and protonix. These were restarted. Carotid dopplers RICA 40-59%, >50% ECA, bilateral subclavian with weakness in LUE-saw Dr. Allyson Sabal and will follow her clinically given her age and comorbidities.  Patient comes in accompanied by her son. Sometimes she has swelling and weight is up/down 3-4 lbs. She feels full in her  abdomen. No chest pain, dyspnea, palpitations. Sometimes at night when she lays down she coughs clear phlegm. Has a lot of reflux. To f/u with PCP       Past Medical History:  Diagnosis Date   CAD in native artery    a. 04/2016 NSTEMI with cath showing 25% MCx, 75% DX1 lesion and moderate LVD 35-45% out of proportion to CAD and in a pattern of Takotsubo CM (no clear stressor)   Carotid stenosis    Carotid Doppler showed 40 to 59% stenosis of the right carotid artery and less than 50% stenosis of the right common carotid artery.  There is 1 to 39% stenosis of the left internal carotid artery and greater than 50% stenosis of the common carotid artery on the left.  There is also left subclavian artery stenosis. by dopplers 05/2021   Dyslipidemia    GERD (gastroesophageal reflux disease)    Hypercholesteremia    Hypertension    Hypertensive heart disease    initially she was thought to have a HOCM variant by cath with increased LVOT gradient but echo showed only a gradient across the LVOT and SAM and cardiac MRI did not show any late gad enhancement and only a LVOT gradient with 15mm BSH.  There was some mild SAM and mild MR.  EF normal at 76%.     LVH (left ventricular hypertrophy)    a. cMRI 2018 - showing moderate Basal septal hypertrophy not classic for HOCM morphology with no delayed gadolium uptake to suggest myofibrillar disarray, + SAM with  small LVOT gradient in regard to physiology and mild MR.   Mild aortic stenosis    NSTEMI (non-ST elevated myocardial infarction) (HCC) 04/2016   secondary to stress CM - Takotsubo CM   PAC (premature atrial contraction)    Event monitors 10/2013 and 08/2014 showed NSR, ST, PACs, PVCs.    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   CARDIAC CATHETERIZATION N/A 04/21/2016   Procedure: Left Heart Cath and Coronary Angiography;  Surgeon: Corky Crafts, MD;  Location: [facility] INVASIVE CV LAB;  Service: Cardiovascular;  Laterality: N/A;   IR GENERIC HISTORICAL  02/13/2016   IR RADIOLOGY PERIPHERAL GUIDED IV START 02/13/2016 Berdine Dance, MD [facility]   IR GENERIC HISTORICAL  02/13/2016   IR US GUIDE VASC ACCESS LEFT 02/13/2016 Berdine Dance, MD [facility]    Current Medications: Current Meds  Medication Sig   acetaminophen (TYLENOL) 325 MG tablet Take 2 tablets (650 mg total) by mouth every 4 (four) hours as needed for headache or mild pain.   amLODipine (NORVASC) 2.5 MG tablet Take 1 tablet (2.5 mg total) by mouth daily.   aspirin EC 81 MG EC tablet Take 1 tablet (81 mg total) by mouth daily.   atorvastatin (LIPITOR) 80 MG tablet Take 1 tablet (80 mg total) by mouth daily.   Calcium Carbonate-Vitamin D (CALCIUM-D PO) Take 1 tablet by mouth daily.   carvedilol (COREG) 12.5 MG tablet Take 1 tablet (12.5 mg total) by mouth 2 (two) times daily.   ezetimibe (ZETIA) 10 MG tablet Take 1 tablet (10 mg total) by mouth daily.   fluticasone (FLONASE) 50 MCG/ACT nasal spray Place 1 spray into both nostrils daily as needed (congestion).    fluticasone (FLOVENT HFA) 110 MCG/ACT inhaler Inhale 1 puff into the lungs as needed.    gabapentin (NEURONTIN) 300 MG capsule Take 300 mg by mouth 3 (three) times daily.   hydrochlorothiazide (HYDRODIURIL) 12.5 MG tablet Take 1 tablet (12.5 mg total) by mouth daily.   losartan (COZAAR) 100 MG tablet Take 1 tablet by mouth daily.   Multiple Vitamin (MULTIVITAMIN WITH MINERALS) TABS tablet Take 1 tablet by mouth daily.   Omega-3 Fatty Acids (FISH OIL PO) Take  1 capsule by mouth daily at 2 PM.      Allergies:   Other, Ace inhibitors, Codeine, and Penicillins   Social History   Socioeconomic History   Marital status: Single    Spouse name: Not on file   Number of children: Not on file   Years of education: Not on file   Highest education level: Not on file  Occupational History   Not on file  Tobacco Use   Smoking status: Former    Types: Cigarettes    Quit date: 07/13/2004    Years since quitting: 17.0   Smokeless tobacco: Never  Vaping Use   Vaping Use: Never used  Substance and Sexual Activity   Alcohol use: Yes    Comment: ocassionally    Drug use: No  Sexual activity: Not on file  Other Topics Concern   Not on file  Social History Narrative   Not on file   Social Determinants of Health   Financial Resource Strain: Not on file  Food Insecurity: Not on file  Transportation Needs: Not on file  Physical Activity: Not on file  Stress: Not on file  Social Connections: Not on file     Family History:  The patient's  family history includes Heart attack in her brother, mother, and sister; Heart disease in her brother, mother, and sister.   ROS:   Please see the history of present illness.    ROS All other systems reviewed and are negative.   PHYSICAL EXAM:   VS:  BP 126/62 (BP Location: Right Arm, Patient Position: Sitting, Cuff Size: Normal)    Pulse 62    Ht 5\' 2"  (1.575 m)    Wt 138 lb 12.8 oz (63 kg)    SpO2 97%    BMI 25.39 kg/m   Physical Exam  GEN: Well nourished, well developed, in no acute distress  Neck: Bilateral carotid bruits, no JVD, or masses Cardiac:RRR; 1/6 systolic murmur at the left sternal border Respiratory:  clear to auscultation bilaterally, normal work of breathing GI: soft, nontender, nondistended, + BS Ext: Right lower extremity prosthesis,left lower extremity without cyanosis, clubbing, or edema, Good distal pulses bilaterally Neuro:  Alert and Oriented x 3, Psych: euthymic mood, full  affect  Wt Readings from Last 3 Encounters:  07/15/21 138 lb 12.8 oz (63 kg)  05/28/21 140 lb 12.8 oz (63.9 kg)  05/08/21 136 lb (61.7 kg)      Studies/Labs Reviewed:   EKG:  EKG is not ordered today.     Recent Labs: 09/16/2020: ALT 12; BUN 15; Creatinine, Ser 0.90; Potassium 3.9; Sodium 142   Lipid Panel    Component Value Date/Time   CHOL 154 09/16/2020 1153   TRIG 60 09/16/2020 1153   HDL 55 09/16/2020 1153   CHOLHDL 2.8 09/16/2020 1153   CHOLHDL 3.9 04/22/2016 0017   VLDL 52 (H) 04/22/2016 0017   LDLCALC 87 09/16/2020 1153    Additional studies/ records that were reviewed today include:   Carotid dopplers 05/16/21 Summary:  Right Carotid: Velocities in the right ICA are consistent with a 40-59%                 stenosis. Non-hemodynamically significant plaque <50% noted  in                the CCA. The ECA appears >50% stenosed. Stenosis based on  peak                systolic velocities and plaque formation.   Left Carotid: Velocities in the left ICA are consistent with a 1-39%  stenosis.               Hemodynamically significant plaque >50% visualized in the  CCA. The                ECA appears >50% stenosed.   Vertebrals:  Bilateral vertebral arteries demonstrate antegrade flow.  Subclavians: Left subclavian artery was stenotic. Bilateral subclavian  artery              flow was disturbed.   *See table(s) above for measurements and observations.  Suggest follow up study in 12 months.    Electronically signed by 13/4/22 MD on 05/16/2021 at 3:22:07 PM.    13/10/2020 11/2019 Study  Highlights   Nuclear stress EF: 63%. There was no ST segment deviation noted during stress. The study is normal. This is a low risk study. The left ventricular ejection fraction is normal (55-65%).   2D echo 11/2019 IMPRESSIONS    1. Left ventricular ejection fraction, by estimation, is 60 to 65%. The  left ventricle has normal function. The left ventricle has no  regional  wall motion abnormalities. There is mild left ventricular hypertrophy of  the basal-septal segment. Left ventricular diastolic parameters are consistent with Grade I diastolic  dysfunction (impaired relaxation). Elevated left atrial pressure.   2. Right ventricular systolic function is normal. The right ventricular  size is normal. Tricuspid regurgitation signal is inadequate for assessing  PA pressure.   3. The mitral valve is normal in structure. Mild mitral valve  regurgitation. No evidence of mitral stenosis.   4. The aortic valve is normal in structure. Aortic valve regurgitation is  not visualized. No aortic stenosis is present.   5. The inferior vena cava is normal in size with greater than 50%  respiratory variability, suggesting right atrial pressure of 3 mmHg.      Risk Assessment/Calculations:         ASSESSMENT:    1. Coronary artery disease involving native coronary artery of native heart without angina pectoris   2. Hypertensive heart disease with congestive heart failure, unspecified heart failure type (HCC)   3. Takotsubo cardiomyopathy   4. Hyperlipidemia, unspecified hyperlipidemia type   5. Peripheral vascular disease, unspecified (HCC)      PLAN:  In order of problems listed above:   ASCAD Cardiac cath 04/21/16 revealed 25 mCx, 75% ostial D1, LVEF 35-45% in pattern of Takotsubo CM, and a 55 mmHg LVOT gradient felt due to subvalvular stenosis. -she has had chronic CP for years but none recently  (had avoided nitrates in the past due to ? HOCM dx in the past)-Lexiscan myoview 11/2019 showed no ischemia    Hypertensive heart disease Her BP is  controlled today.  She is unsure of what dose losartan she is taking.  She will call back and let us know.  Dr. Mayford Knife decreased to 25 mg daily.   Takotsubo cardiomyopathy -LVF normal on echo 2019 at 65-70% and echo 11/2019. Occasional Left lower ext edema-2 gm sodium diet, support hose. No evidence of edema on  exam today.    HLD LDL 67-10/24/22-LDL goal < 70 on Lipitor 40 mg daily andt Zetia  daily   Bilateral carotid stenosis followed by Dr. Allyson Sabal  Bilateral subclavian stenosis   with weakness in LUE-saw Dr. Allyson Sabal and will follow her clinically given her age and comorbidities.       Shared Decision Making/Informed Consent        Medication Adjustments/Labs and Tests Ordered: Current medicines are reviewed at length with the patient today.  Concerns regarding medicines are outlined above.  Medication changes, Labs and Tests ordered today are listed in the Patient Instructions below. Patient Instructions  Medication Instructions:  Your physician recommends that you continue on your current medications as directed. Please refer to the Current Medication list given to you today.  *If you need a refill on your cardiac medications before your next appointment, please call your pharmacy*   Lab Work: NONE If you have labs (blood work) drawn today and your tests are completely normal, you will receive your results only by: MyChart Message (if you have MyChart) OR A paper copy in the mail If you have any  lab test that is abnormal or we need to change your treatment, we will call you to review the results.   Testing/Procedures: NONE   Follow-Up: At [facility], you and your health needs are our priority.  As part of our continuing mission to provide you with exceptional heart care, we have created designated Provider Care Teams.  These Care Teams include your primary Cardiologist (physician) and Advanced Practice Providers (APPs -  Physician Assistants and Nurse Practitioners) who all work together to provide you with the care you need, when you need it.  We recommend signing up for the patient portal called "MyChart".  Sign up information is provided on this After Visit Summary.  MyChart is used to connect with patients for Virtual Visits (Telemedicine).  Patients are able to view  lab/test results, encounter notes, upcoming appointments, etc.  Non-urgent messages can be sent to your provider as well.   To learn more about what you can do with MyChart, go to [email or web address].    Your next appointment:   6 month(s)  The format for your next appointment:   In Person  Provider:   Armanda Magic, MD  Other Instructions Two Gram Sodium Diet 2000 mg  What is Sodium? Sodium is a mineral found naturally in many foods. The most significant source of sodium in the diet is table salt, which is about 40% sodium.  Processed, convenience, and preserved foods also contain a large amount of sodium.  The body needs only 500 mg of sodium daily to function,  A normal diet provides more than enough sodium even if you do not use salt.  Why Limit Sodium? A build up of sodium in the body can cause thirst, increased blood pressure, shortness of breath, and water retention.  Decreasing sodium in the diet can reduce edema and risk of heart attack or stroke associated with high blood pressure.  Keep in mind that there are many other factors involved in these health problems.  Heredity, obesity, lack of exercise, cigarette smoking, stress and what you eat all play a role.  General Guidelines: Do not add salt at the table or in cooking.  One teaspoon of salt contains over 2 grams of sodium. Read food labels Avoid processed and convenience foods Ask your dietitian before eating any foods not dicussed in the menu planning guidelines Consult your physician if you wish to use a salt substitute or a sodium containing medication such as antacids.  Limit milk and milk products to 16 oz (2 cups) per day.  Shopping Hints: READ LABELS!! "Dietetic" does not necessarily mean low sodium. Salt and other sodium ingredients are often added to foods during processing.    Menu Planning Guidelines Food Group Choose More Often Avoid  Beverages (see also the milk group All fruit juices, low-sodium,  salt-free vegetables juices, low-sodium carbonated beverages Regular vegetable or tomato juices, commercially softened water used for drinking or cooking  Breads and Cereals Enriched white, wheat, rye and pumpernickel bread, hard rolls and dinner rolls; muffins, cornbread and waffles; most dry cereals, cooked cereal without added salt; unsalted crackers and breadsticks; low sodium or homemade bread crumbs Bread, rolls and crackers with salted tops; quick breads; instant hot cereals; pancakes; commercial bread stuffing; self-rising flower and biscuit mixes; regular bread crumbs or cracker crumbs  Desserts and Sweets Desserts and sweets mad with mild should be within allowance Instant pudding mixes and cake mixes  Fats Butter or margarine; vegetable oils; unsalted salad dressings, regular salad dressings limited to  1 Tbs; light, sour and heavy cream Regular salad dressings containing bacon fat, bacon bits, and salt pork; snack dips made with instant soup mixes or processed cheese; salted nuts  Fruits Most fresh, frozen and canned fruits Fruits processed with salt or sodium-containing ingredient (some dried fruits are processed with sodium sulfites        Vegetables Fresh, frozen vegetables and low- sodium canned vegetables Regular canned vegetables, sauerkraut, pickled vegetables, and others prepared in brine; frozen vegetables in sauces; vegetables seasoned with ham, bacon or salt pork  Condiments, Sauces, Miscellaneous  Salt substitute with physician's approval; pepper, herbs, spices; vinegar, lemon or lime juice; hot pepper sauce; garlic powder, onion powder, low sodium soy sauce (1 Tbs.); low sodium condiments (ketchup, chili sauce, mustard) in limited amounts (1 tsp.) fresh ground horseradish; unsalted tortilla chips, pretzels, potato chips, popcorn, salsa (1/4 cup) Any seasoning made with salt including garlic salt, celery salt, onion salt, and seasoned salt; sea salt, rock salt, kosher salt; meat  tenderizers; monosodium glutamate; mustard, regular soy sauce, barbecue, sauce, chili sauce, teriyaki sauce, steak sauce, Worcestershire sauce, and most flavored vinegars; canned gravy and mixes; regular condiments; salted snack foods, olives, picles, relish, horseradish sauce, catsup   Food preparation: Try these seasonings Meats:    Pork Sage, onion Serve with applesauce  Chicken Poultry seasoning, thyme, parsley Serve with cranberry sauce  Lamb Curry powder, rosemary, garlic, thyme Serve with mint sauce or jelly  Veal Marjoram, basil Serve with current jelly, cranberry sauce  Beef Pepper, bay leaf Serve with dry mustard, unsalted chive butter  Fish Bay leaf, dill Serve with unsalted lemon butter, unsalted parsley butter  Vegetables:    Asparagus Lemon juice   Broccoli Lemon juice   Carrots Mustard dressing parsley, mint, nutmeg, glazed with unsalted butter and sugar   Green beans Marjoram, lemon juice, nutmeg,dill seed   Tomatoes Basil, marjoram, onion   Spice /blend for [employer]" 4 tsp ground thyme 1 tsp ground sage 3 tsp ground rosemary 4 tsp ground marjoram   Test your knowledge A product that says "Salt Free" may still contain sodium. True or False Garlic Powder and Hot Pepper Sauce an be used as alternative seasonings.True or False Processed foods have more sodium than fresh foods.  True or False Canned Vegetables have less sodium than froze True or False   WAYS TO DECREASE YOUR SODIUM INTAKE Avoid the use of added salt in cooking and at the table.  Table salt (and other prepared seasonings which contain salt) is probably one of the greatest sources of sodium in the diet.  Unsalted foods can gain flavor from the sweet, sour, and butter taste sensations of herbs and spices.  Instead of using salt for seasoning, try the following seasonings with the foods listed.  Remember: how you use them to enhance natural food flavors is limited only by your creativity... Allspice-Meat,  fish, eggs, fruit, peas, red and yellow vegetables Almond Extract-Fruit baked goods Anise Seed-Sweet breads, fruit, carrots, beets, cottage cheese, cookies (tastes like licorice) Basil-Meat, fish, eggs, vegetables, rice, vegetables salads, soups, sauces Bay Leaf-Meat, fish, stews, poultry Burnet-Salad, vegetables (cucumber-like flavor) Caraway Seed-Bread, cookies, cottage cheese, meat, vegetables, cheese, rice Cardamon-Baked goods, fruit, soups Celery Powder or seed-Salads, salad dressings, sauces, meatloaf, soup, bread.Do not use  celery salt Chervil-Meats, salads, fish, eggs, vegetables, cottage cheese (parsley-like flavor) Chili Power-Meatloaf, chicken cheese, corn, eggplant, egg dishes Chives-Salads cottage cheese, egg dishes, soups, vegetables, sauces Cilantro-Salsa, casseroles Cinnamon-Baked goods, fruit, pork, lamb, chicken, carrots Cloves-Fruit,  baked goods, fish, pot roast, green beans, beets, carrots Coriander-Pastry, cookies, meat, salads, cheese (lemon-orange flavor) Cumin-Meatloaf, fish,cheese, eggs, cabbage,fruit pie (caraway flavor) [employer], fruit, eggs, fish, poultry, cottage cheese, vegetables Dill Seed-Meat, cottage cheese, poultry, vegetables, fish, salads, bread Fennel Seed-Bread, cookies, apples, pork, eggs, fish, beets, cabbage, cheese, Licorice-like flavor Garlic-(buds or powder) Salads, meat, poultry, fish, bread, butter, vegetables, potatoes.Do not  use garlic salt Ginger-Fruit, vegetables, baked goods, meat, fish, poultry Horseradish Root-Meet, vegetables, butter Lemon Juice or Extract-Vegetables, fruit, tea, baked goods, fish salads Mace-Baked goods fruit, vegetables, fish, poultry (taste like nutmeg) Maple Extract-Syrups Marjoram-Meat, chicken, fish, vegetables, breads, green salads (taste like Sage) Mint-Tea, lamb, sherbet, vegetables, desserts, carrots, cabbage Mustard, Dry or Seed-Cheese, eggs, meats, vegetables, poultry Nutmeg-Baked goods, fruit,  chicken, eggs, vegetables, desserts Onion Powder-Meat, fish, poultry, vegetables, cheese, eggs, bread, rice salads (Do not use   Onion salt) Orange Extract-Desserts, baked goods Oregano-Pasta, eggs, cheese, onions, pork, lamb, fish, chicken, vegetables, green salads Paprika-Meat, fish, poultry, eggs, cheese, vegetables Parsley Flakes-Butter, vegetables, meat fish, poultry, eggs, bread, salads (certain forms may   Contain sodium Pepper-Meat fish, poultry, vegetables, eggs Peppermint Extract-Desserts, baked goods Poppy Seed-Eggs, bread, cheese, fruit dressings, baked goods, noodles, vegetables, cottage  [employer], poultry, meat, fish, cauliflower, turnips,eggs bread Saffron-Rice, bread, veal, chicken, fish, eggs Sage-Meat, fish, poultry, onions, eggplant, tomateos, pork, stews Savory-Eggs, salads, poultry, meat, rice, vegetables, soups, pork Tarragon-Meat, poultry, fish, eggs, butter, vegetables (licorice-like flavor)  Thyme-Meat, poultry, fish, eggs, vegetables, (clover-like flavor), sauces, soups Tumeric-Salads, butter, eggs, fish, rice, vegetables (saffron-like flavor) Vanilla Extract-Baked goods, candy Vinegar-Salads, vegetables, meat marinades Walnut Extract-baked goods, candy   2. Choose your Foods Wisely   The following is a list of foods to avoid which are high in sodium:  Meats-Avoid all smoked, canned, salt cured, dried and kosher meat and fish as well as Anchovies   [employer] meats:Bologna, Liverwurst, Pastrami Canned meat or fish  Marinated herring Caviar    Pepperoni Corned Beef   Pizza Dried chipped beef  Salami Frozen breaded fish or meat Salt pork Frankfurters or hot dogs  Sardines Gefilte fish   Sausage Ham (boiled ham, Proscuitto Smoked butt    spiced ham)   Spam      TV Dinners Vegetables Canned vegetables (Regular) Relish Canned mushrooms  Sauerkraut Olives    Tomato juice Pickles  Bakery and  Dessert Products Canned puddings  Cream pies Cheesecake   Decorated cakes Cookies  Beverages/Juices Tomato juice, regular  Gatorade   V-8 vegetable juice, regular  Breads and Cereals Biscuit mixes   Salted potato chips, corn chips, pretzels Bread stuffing mixes  Salted crackers and rolls Pancake and waffle mixes Self-rising flour  Seasonings Accent    Meat sauces Barbecue sauce  Meat tenderizer Catsup    Monosodium glutamate (MSG) Celery salt   Onion salt Chili sauce   Prepared mustard Garlic salt   Salt, seasoned salt, sea salt Gravy mixes   Soy sauce Horseradish   Steak sauce Ketchup   Tartar sauce Lite salt    Teriyaki sauce Marinade mixes   Worcestershire sauce  Others Baking powder   Cocoa and cocoa mixes Baking soda   Commercial casserole mixes Candy-caramels, chocolate  Dehydrated soups    Bars, fudge,nougats  Instant rice and pasta mixes Canned broth or soup  Maraschino cherries Cheese, aged and processed cheese and cheese spreads  Learning Assessment Quiz  Indicated T (for True) or F (for False) for each of the  following statements:  _____ Fresh fruits and vegetables and unprocessed grains are generally low in sodium _____ Water may contain a considerable amount of sodium, depending on the source _____ You can always tell if a food is high in sodium by tasting it _____ Certain laxatives my be high in sodium and should be avoided unless prescribed   by a physician or pharmacist _____ Salt substitutes may be used freely by anyone on a sodium restricted diet _____ Sodium is present in table salt, food additives and as a natural component of   most foods _____ Table salt is approximately 90% sodium _____ Limiting sodium intake may help prevent excess fluid accumulation in the body _____ On a sodium-restricted diet, seasonings such as bouillon soy sauce, and    cooking wine should be used in place of table salt _____ On an ingredient list, a product which lists  monosodium glutamate as the first   ingredient is an appropriate food to include on a low sodium diet  Circle the best answer(s) to the following statements (Hint: there may be more than one correct answer)  11. On a low-sodium diet, some acceptable snack items are:    A. Olives  F. Bean dip   K. Grapefruit juice    B. Salted Pretzels G. Commercial Popcorn   L. Canned peaches    C. Carrot Sticks  H. Bouillon   M. Unsalted nuts   D. [place] fries  I. Peanut butter crackers N. Salami   E. Sweet pickles J. Tomato Juice   O. Pizza  12.  Seasonings that may be used freely on a reduced - sodium diet include   A. Lemon wedges F.Monosodium glutamate K. Celery seed    B.Soysauce   G. Pepper   L. Mustard powder   C. Sea salt  H. Cooking wine  M. Onion flakes   D. Vinegar  E. Prepared horseradish N. Salsa   E. Sage   J. Worcestershire sauce  O. [address]     Elson Clan, PA-C  07/15/2021 2:05 PM    [facility] [address] [place], [place], [place]  [address] Phone: [phone]; Fax: [phone]

## 2021-07-15 ENCOUNTER — Encounter (INDEPENDENT_AMBULATORY_CARE_PROVIDER_SITE_OTHER): Payer: Self-pay

## 2021-07-15 ENCOUNTER — Encounter: Payer: Self-pay | Admitting: Physician Assistant

## 2021-07-15 ENCOUNTER — Other Ambulatory Visit: Payer: Self-pay

## 2021-07-15 ENCOUNTER — Ambulatory Visit: Payer: Medicare Other

## 2021-07-15 ENCOUNTER — Ambulatory Visit (INDEPENDENT_AMBULATORY_CARE_PROVIDER_SITE_OTHER): Payer: Commercial Managed Care - HMO | Admitting: Physician Assistant

## 2021-07-15 VITALS — BP 126/62 | HR 62 | Ht 62.0 in | Wt 138.8 lb

## 2021-07-15 DIAGNOSIS — I110 Hypertensive heart disease with heart failure: Secondary | ICD-10-CM | POA: Diagnosis not present

## 2021-07-15 DIAGNOSIS — E785 Hyperlipidemia, unspecified: Secondary | ICD-10-CM

## 2021-07-15 DIAGNOSIS — I739 Peripheral vascular disease, unspecified: Secondary | ICD-10-CM | POA: Diagnosis not present

## 2021-07-15 DIAGNOSIS — I5181 Takotsubo syndrome: Secondary | ICD-10-CM | POA: Diagnosis not present

## 2021-07-15 DIAGNOSIS — I2510 Atherosclerotic heart disease of native coronary artery without angina pectoris: Secondary | ICD-10-CM | POA: Diagnosis not present

## 2021-07-15 NOTE — Patient Instructions (Signed)
Medication Instructions:  Your physician recommends that you continue on your current medications as directed. Please refer to the Current Medication list given to you today.  *If you need a refill on your cardiac medications before your next appointment, please call your pharmacy*   Lab Work: NONE If you have labs (blood work) drawn today and your tests are completely normal, you will receive your results only by: [place] (if you have MyChart) OR A paper copy in the mail If you have any lab test that is abnormal or we need to change your treatment, we will call you to review the results.   Testing/Procedures: NONE   Follow-Up: At [facility], you and your health needs are our priority.  As part of our continuing mission to provide you with exceptional heart care, we have created designated Provider Care Teams.  These Care Teams include your primary Cardiologist (physician) and Advanced Practice Providers (APPs -  Physician Assistants and Nurse Practitioners) who all work together to provide you with the care you need, when you need it.  We recommend signing up for the patient portal called "MyChart".  Sign up information is provided on this After Visit Summary.  MyChart is used to connect with patients for Virtual Visits (Telemedicine).  Patients are able to view lab/test results, encounter notes, upcoming appointments, etc.  Non-urgent messages can be sent to your provider as well.   To learn more about what you can do with MyChart, go to [email or web address].    Your next appointment:   6 month(s)  The format for your next appointment:   In Person  Provider:   Fransico Him, MD  Other Instructions Two Gram Sodium Diet 2000 mg  What is Sodium? Sodium is a mineral found naturally in many foods. The most significant source of sodium in the diet is table salt, which is about 40% sodium.  Processed, convenience, and preserved foods also contain a large amount of sodium.   The body needs only 500 mg of sodium daily to function,  A normal diet provides more than enough sodium even if you do not use salt.  Why Limit Sodium? A build up of sodium in the body can cause thirst, increased blood pressure, shortness of breath, and water retention.  Decreasing sodium in the diet can reduce edema and risk of heart attack or stroke associated with high blood pressure.  Keep in mind that there are many other factors involved in these health problems.  Heredity, obesity, lack of exercise, cigarette smoking, stress and what you eat all play a role.  General Guidelines: Do not add salt at the table or in cooking.  One teaspoon of salt contains over 2 grams of sodium. Read food labels Avoid processed and convenience foods Ask your dietitian before eating any foods not dicussed in the menu planning guidelines Consult your physician if you wish to use a salt substitute or a sodium containing medication such as antacids.  Limit milk and milk products to 16 oz (2 cups) per day.  Shopping Hints: READ LABELS!! "Dietetic" does not necessarily mean low sodium. Salt and other sodium ingredients are often added to foods during processing.    Menu Planning Guidelines Food Group Choose More Often Avoid  Beverages (see also the milk group All fruit juices, low-sodium, salt-free vegetables juices, low-sodium carbonated beverages Regular vegetable or tomato juices, commercially softened water used for drinking or cooking  Breads and Cereals Enriched white, wheat, rye and pumpernickel bread, hard rolls and  dinner rolls; muffins, cornbread and waffles; most dry cereals, cooked cereal without added salt; unsalted crackers and breadsticks; low sodium or homemade bread crumbs Bread, rolls and crackers with salted tops; quick breads; instant hot cereals; pancakes; commercial bread stuffing; self-rising flower and biscuit mixes; regular bread crumbs or cracker crumbs  Desserts and Sweets Desserts and  sweets mad with mild should be within allowance Instant pudding mixes and cake mixes  Fats Butter or margarine; vegetable oils; unsalted salad dressings, regular salad dressings limited to 1 Tbs; light, sour and heavy cream Regular salad dressings containing bacon fat, bacon bits, and salt pork; snack dips made with instant soup mixes or processed cheese; salted nuts  Fruits Most fresh, frozen and canned fruits Fruits processed with salt or sodium-containing ingredient (some dried fruits are processed with sodium sulfites        Vegetables Fresh, frozen vegetables and low- sodium canned vegetables Regular canned vegetables, sauerkraut, pickled vegetables, and others prepared in brine; frozen vegetables in sauces; vegetables seasoned with ham, bacon or salt pork  Condiments, Sauces, Miscellaneous  Salt substitute with physician's approval; pepper, herbs, spices; vinegar, lemon or lime juice; hot pepper sauce; garlic powder, onion powder, low sodium soy sauce (1 Tbs.); low sodium condiments (ketchup, chili sauce, mustard) in limited amounts (1 tsp.) fresh ground horseradish; unsalted tortilla chips, pretzels, potato chips, popcorn, salsa (1/4 cup) Any seasoning made with salt including garlic salt, celery salt, onion salt, and seasoned salt; sea salt, rock salt, kosher salt; meat tenderizers; monosodium glutamate; mustard, regular soy sauce, barbecue, sauce, chili sauce, teriyaki sauce, steak sauce, Worcestershire sauce, and most flavored vinegars; canned gravy and mixes; regular condiments; salted snack foods, olives, picles, relish, horseradish sauce, catsup   Food preparation: Try these seasonings Meats:    Pork Sage, onion Serve with applesauce  Chicken Poultry seasoning, thyme, parsley Serve with cranberry sauce  Lamb Curry powder, rosemary, garlic, thyme Serve with mint sauce or jelly  Veal Marjoram, basil Serve with current jelly, cranberry sauce  Beef Pepper, bay leaf Serve with dry mustard,  unsalted chive butter  [place] leaf, dill Serve with unsalted lemon butter, unsalted parsley butter  Vegetables:    Asparagus Lemon juice   Broccoli Lemon juice   Carrots Mustard dressing parsley, mint, nutmeg, glazed with unsalted butter and sugar   Green beans Marjoram, lemon juice, nutmeg,dill seed   Tomatoes Basil, marjoram, onion   Spice /blend for [employer]" 4 tsp ground thyme 1 tsp ground sage 3 tsp ground rosemary 4 tsp ground marjoram   Test your knowledge A product that says "Salt Free" may still contain sodium. True or False Garlic Powder and Hot Pepper Sauce an be used as alternative seasonings.True or False Processed foods have more sodium than fresh foods.  True or False Canned Vegetables have less sodium than froze True or False   WAYS TO DECREASE YOUR SODIUM INTAKE Avoid the use of added salt in cooking and at the table.  Table salt (and other prepared seasonings which contain salt) is probably one of the greatest sources of sodium in the diet.  Unsalted foods can gain flavor from the sweet, sour, and butter taste sensations of herbs and spices.  Instead of using salt for seasoning, try the following seasonings with the foods listed.  Remember: how you use them to enhance natural food flavors is limited only by your creativity... Allspice-Meat, fish, eggs, fruit, peas, red and yellow vegetables Almond Extract-Fruit baked goods Anise Seed-Sweet breads, fruit, carrots, beets, cottage  cheese, cookies (tastes like licorice) Basil-Meat, fish, eggs, vegetables, rice, vegetables salads, soups, sauces Bay Leaf-Meat, fish, stews, poultry Burnet-Salad, vegetables (cucumber-like flavor) Caraway Seed-Bread, cookies, cottage cheese, meat, vegetables, cheese, rice Cardamon-Baked goods, fruit, soups Celery Powder or seed-Salads, salad dressings, sauces, meatloaf, soup, bread.Do not use  celery salt Chervil-Meats, salads, fish, eggs, vegetables, cottage cheese (parsley-like  flavor) Chili Power-Meatloaf, chicken cheese, corn, eggplant, egg dishes Chives-Salads cottage cheese, egg dishes, soups, vegetables, sauces Cilantro-Salsa, casseroles Cinnamon-Baked goods, fruit, pork, lamb, chicken, carrots Cloves-Fruit, baked goods, fish, pot roast, green beans, beets, carrots Coriander-Pastry, cookies, meat, salads, cheese (lemon-orange flavor) Cumin-Meatloaf, fish,cheese, eggs, cabbage,fruit pie (caraway flavor) [employer], fruit, eggs, fish, poultry, cottage cheese, vegetables Dill Seed-Meat, cottage cheese, poultry, vegetables, fish, salads, bread Fennel Seed-Bread, cookies, apples, pork, eggs, fish, beets, cabbage, cheese, Licorice-like flavor Garlic-(buds or powder) Salads, meat, poultry, fish, bread, butter, vegetables, potatoes.Do not  use garlic salt Ginger-Fruit, vegetables, baked goods, meat, fish, poultry Horseradish Root-Meet, vegetables, butter Lemon Juice or Extract-Vegetables, fruit, tea, baked goods, fish salads Mace-Baked goods fruit, vegetables, fish, poultry (taste like nutmeg) Maple Extract-Syrups Marjoram-Meat, chicken, fish, vegetables, breads, green salads (taste like Sage) Mint-Tea, lamb, sherbet, vegetables, desserts, carrots, cabbage Mustard, Dry or Seed-Cheese, eggs, meats, vegetables, poultry Nutmeg-Baked goods, fruit, chicken, eggs, vegetables, desserts Onion Powder-Meat, fish, poultry, vegetables, cheese, eggs, bread, rice salads (Do not use   Onion salt) Orange Extract-Desserts, baked goods Oregano-Pasta, eggs, cheese, onions, pork, lamb, fish, chicken, vegetables, green salads Paprika-Meat, fish, poultry, eggs, cheese, vegetables Parsley Flakes-Butter, vegetables, meat fish, poultry, eggs, bread, salads (certain forms may   Contain sodium Pepper-Meat fish, poultry, vegetables, eggs Peppermint Extract-Desserts, baked goods Poppy Seed-Eggs, bread, cheese, fruit dressings, baked goods, noodles, vegetables, cottage  [employer], poultry, meat, fish, cauliflower, turnips,eggs bread Saffron-Rice, bread, veal, chicken, fish, eggs Sage-Meat, fish, poultry, onions, eggplant, tomateos, pork, stews Savory-Eggs, salads, poultry, meat, rice, vegetables, soups, pork Tarragon-Meat, poultry, fish, eggs, butter, vegetables (licorice-like flavor)  Thyme-Meat, poultry, fish, eggs, vegetables, (clover-like flavor), sauces, soups Tumeric-Salads, butter, eggs, fish, rice, vegetables (saffron-like flavor) Vanilla Extract-Baked goods, candy Vinegar-Salads, vegetables, meat marinades Walnut Extract-baked goods, candy   2. Choose your Foods Wisely   The following is a list of foods to avoid which are high in sodium:  Meats-Avoid all smoked, canned, salt cured, dried and kosher meat and fish as well as Anchovies   [employer] meats:Bologna, Liverwurst, Pastrami Canned meat or fish  Marinated herring Caviar    Pepperoni Corned Beef   Pizza Dried chipped beef  Salami Frozen breaded fish or meat Salt pork Frankfurters or hot dogs  Sardines Gefilte fish   Sausage Ham (boiled ham, Proscuitto Smoked butt    spiced ham)   Spam      TV Dinners Vegetables Canned vegetables (Regular) Relish Canned mushrooms  Sauerkraut Olives    Tomato juice Pickles  Bakery and Dessert Products Canned puddings  Cream pies Cheesecake   Decorated cakes Cookies  Beverages/Juices Tomato juice, regular  Gatorade   V-8 vegetable juice, regular  Breads and Cereals Biscuit mixes   Salted potato chips, corn chips, pretzels Bread stuffing mixes  Salted crackers and rolls Pancake and waffle mixes Self-rising flour  Seasonings Accent    Meat sauces Barbecue sauce  Meat tenderizer Catsup    Monosodium glutamate (MSG) Celery salt   Onion salt Chili sauce   Prepared mustard Garlic salt   Salt, seasoned salt, sea salt Gravy mixes   Soy sauce Horseradish  Steak sauce Ketchup   Tartar sauce Lite  salt    Teriyaki sauce Marinade mixes   Worcestershire sauce  Others Baking powder   Cocoa and cocoa mixes Baking soda   Commercial casserole mixes Candy-caramels, chocolate  Dehydrated soups    Bars, fudge,nougats  Instant rice and pasta mixes Canned broth or soup  Maraschino cherries Cheese, aged and processed cheese and cheese spreads  Learning Assessment Quiz  Indicated T (for True) or F (for False) for each of the following statements:  _____ Fresh fruits and vegetables and unprocessed grains are generally low in sodium _____ Water may contain a considerable amount of sodium, depending on the source _____ You can always tell if a food is high in sodium by tasting it _____ Certain laxatives my be high in sodium and should be avoided unless prescribed   by a physician or pharmacist _____ Salt substitutes may be used freely by anyone on a sodium restricted diet _____ Sodium is present in table salt, food additives and as a natural component of   most foods _____ Table salt is approximately 90% sodium _____ Limiting sodium intake may help prevent excess fluid accumulation in the body _____ On a sodium-restricted diet, seasonings such as bouillon soy sauce, and    cooking wine should be used in place of table salt _____ On an ingredient list, a product which lists monosodium glutamate as the first   ingredient is an appropriate food to include on a low sodium diet  Circle the best answer(s) to the following statements (Hint: there may be more than one correct answer)  11. On a low-sodium diet, some acceptable snack items are:    A. Olives  F. Bean dip   K. Grapefruit juice    B. Salted Pretzels G. Commercial Popcorn   L. Canned peaches    C. Carrot Sticks  H. Bouillon   M. Unsalted nuts   D. [place] fries  I. Peanut butter crackers N. Salami   E. Sweet pickles J. Tomato Juice   O. Pizza  12.  Seasonings that may be used freely on a reduced - sodium diet include   A. Lemon  wedges F.Monosodium glutamate K. Celery seed    B.Soysauce   G. Pepper   L. Mustard powder   C. Sea salt  H. Cooking wine  M. Onion flakes   D. Vinegar  E. Prepared horseradish N. Salsa   E. Sage   J. Worcestershire sauce  O. Chutney

## 2021-07-16 ENCOUNTER — Telehealth: Payer: Self-pay | Admitting: Cardiology

## 2021-07-16 NOTE — Telephone Encounter (Signed)
Patient verified she is in fact taking losartan 25 mg daily.The 100 mg dose is removed from med list.

## 2021-07-16 NOTE — Telephone Encounter (Signed)
Pt c/o medication issue:  1. Name of Medication: losartan (COZAAR) 25 MG tablet  2. How are you currently taking this medication (dosage and times per day)? AS DIRECTED  3. Are you having a reaction (difficulty breathing--STAT)?   4. What is your medication issue? PT IS CALLING BACK TO REPORT THAT THIS MEDICINE IS 25 MG

## 2021-09-01 ENCOUNTER — Other Ambulatory Visit: Payer: Self-pay

## 2021-09-01 ENCOUNTER — Encounter: Payer: Self-pay | Admitting: Pharmacist Clinician (PhC)/ Clinical Pharmacy Specialist

## 2021-09-01 ENCOUNTER — Ambulatory Visit (INDEPENDENT_AMBULATORY_CARE_PROVIDER_SITE_OTHER): Payer: Medicare Other | Admitting: Pharmacist Clinician (PhC)/ Clinical Pharmacy Specialist

## 2021-09-01 VITALS — BP 148/54 | HR 64 | Resp 14 | Ht 62.0 in | Wt 138.0 lb

## 2021-09-01 DIAGNOSIS — I10 Essential (primary) hypertension: Secondary | ICD-10-CM | POA: Diagnosis not present

## 2021-09-01 DIAGNOSIS — I110 Hypertensive heart disease with heart failure: Secondary | ICD-10-CM | POA: Diagnosis not present

## 2021-09-01 DIAGNOSIS — I5032 Chronic diastolic (congestive) heart failure: Secondary | ICD-10-CM

## 2021-09-01 MED ORDER — LOSARTAN POTASSIUM 50 MG PO TABS: 50.0000 mg | ORAL_TABLET | Freq: Every day | ORAL | 3 refills | Status: DC

## 2021-09-01 NOTE — Patient Instructions (Signed)
°  Go to the lab in 2-3 weeks to check kidney function  Check your blood pressure at home daily (if able) and keep record of the readings.  Take your BP meds as follows:   Increase losartan to 50 mg once daily.   Cut the 100 mg tablets in half or take 2 of the 25 mg tablets  Continue with all your other medications   Stop using Cod Liver oil  Bring all of your meds, your BP cuff and your record of home blood pressures to your next appointment.  Exercise as youre able, try to walk approximately 30 minutes per day.  Keep salt intake to a minimum, especially watch canned and prepared boxed foods.  Eat more fresh fruits and vegetables and fewer canned items.  Avoid eating in fast food restaurants.    HOW TO TAKE YOUR BLOOD PRESSURE: Rest 5 minutes before taking your blood pressure.  Dont smoke or drink caffeinated beverages for at least 30 minutes before. Take your blood pressure before (not after) you eat. Sit comfortably with your back supported and both feet on the floor (dont cross your legs). Elevate your arm to heart level on a table or a desk. Use the proper sized cuff. It should fit smoothly and snugly around your bare upper arm. There should be enough room to slip a fingertip under the cuff. The bottom edge of the cuff should be 1 inch above the crease of the elbow. Ideally, take 3 measurements at one sitting and record the average.

## 2021-09-01 NOTE — Progress Notes (Signed)
09/01/2021 KENNADIE BRENNER 1941-07-13 [id number]   HPI:  [name] is a 81 y.o. female patient of Dr Mayford Knife, with a PMH below who presents today for hypertension and heart failure clinic evaluation.  She was most recently seen by Herma Carson, at which time her pressure was well controlled at 126/62.  She was unsure of the strength of her losartan at that time, but the chart indicated Dr. Mayford Knife had decreased her to 25 mg from 100 mg.  Since then she reports home B readings are mostly 130-150 systolic and diastolic all WNL.    She is in the office today with her daughter in law.  She has many questions in regards to her medications and the reason why she would need to have multiple medications to treat the same condition.  States takes all medications, but wants to review entire list to see if there is anything she can discontinue.  She notes feeling well and has no concerns for chest pain today.    Past Medical History: CAD NSTEMI 2017, felt d/t Takotsubo CM; left subclavian artery stenosis  CHF 10/17 EF 35-45%, now back to 60-65%  hyperlipidemia LDL 67 (10/22) on atorvastatin 80, ezetimibe 10     Blood Pressure Goal:  130/80  Current Medications: carvedilol 12.5 mg bid, losartan 25 mg qd, amlodipine 2.5 mg qd  Family Hx: mother had hypertension after 30's, died at 31 from this; father died from old age at 20; has 7 brothers, 5 sisters, (1 died at birth);  2 sons, 1 with hypertension;   Social Hx: no longer smokes, quit > 20 years ago; no alcohol, occasional coffee/  Diet: mostly home cooked meals, tries to avoid adding salt; plenty of chicken, fish and pork trying to add in more beans; veggies mix of fresh/frozen/canned   Exercise: no regular exercise, does household chores  Home BP readings: by memory  137/56 last Friday; highest was in 140's - about 148, lowest 127-130, diastolic 50-60's  Home meter < 1 year, has wirst and arm cuffs, was advised not to use    Intolerances: ACEI - cough  Labs: 7/22:  Na 141, K 4.2, Glu 84, BUN 15, SCr 0.86, GFR 67   Wt Readings from Last 3 Encounters:  09/01/21 138 lb (62.6 kg)  07/15/21 138 lb 12.8 oz (63 kg)  05/28/21 140 lb 12.8 oz (63.9 kg)   BP Readings from Last 3 Encounters:  09/01/21 (!) 148/54  07/15/21 126/62  05/28/21 (!) 150/66   Pulse Readings from Last 3 Encounters:  09/01/21 64  07/15/21 62  05/28/21 75    Current Outpatient Medications  Medication Sig Dispense Refill   acetaminophen (TYLENOL) 325 MG tablet Take 2 tablets (650 mg total) by mouth every 4 (four) hours as needed for headache or mild pain.     amLODipine (NORVASC) 2.5 MG tablet Take 1 tablet (2.5 mg total) by mouth daily. 90 tablet 3   aspirin EC 81 MG EC tablet Take 1 tablet (81 mg total) by mouth daily.     atorvastatin (LIPITOR) 80 MG tablet Take 1 tablet (80 mg total) by mouth daily. 90 tablet 3   Calcium Carbonate-Vitamin D (CALCIUM-D PO) Take 1 tablet by mouth daily.     carvedilol (COREG) 12.5 MG tablet Take 1 tablet (12.5 mg total) by mouth 2 (two) times daily. 180 tablet 3   ezetimibe (ZETIA) 10 MG tablet Take 1 tablet (10 mg total) by mouth daily. 90 tablet 3  fluticasone (FLONASE) 50 MCG/ACT nasal spray Place 1 spray into both nostrils daily as needed (congestion).      fluticasone (FLOVENT HFA) 110 MCG/ACT inhaler Inhale 1 puff into the lungs as needed.      gabapentin (NEURONTIN) 300 MG capsule Take 300 mg by mouth 3 (three) times daily.     hydrochlorothiazide (HYDRODIURIL) 12.5 MG tablet Take 1 tablet (12.5 mg total) by mouth daily. 90 tablet 3   losartan (COZAAR) 50 MG tablet Take 1 tablet (50 mg total) by mouth daily. 90 tablet 3   Multiple Vitamin (MULTIVITAMIN WITH MINERALS) TABS tablet Take 1 tablet by mouth daily.     Omega-3 Fatty Acids (FISH OIL PO) Take 1 capsule by mouth daily at 2 PM.      omeprazole (PRILOSEC OTC) 20 MG tablet      vitamin E 45 MG (100 UNITS) capsule Take by mouth daily.      No current facility-administered medications for this visit.    Allergies  Allergen Reactions   Other Other (See Comments)   Ace Inhibitors Cough and Other (See Comments)   Codeine Itching, Rash and Other (See Comments)   Penicillins Itching, Rash and Other (See Comments)    Has patient had a PCN reaction causing immediate rash, facial/tongue/throat swelling, SOB or lightheadedness with hypotension: Yes Has patient had a PCN reaction causing severe rash involving mucus membranes or skin necrosis: No Has patient had a PCN reaction that required hospitalization: Unknown Has patient had a PCN reaction occurring within the last 10 years: No If all of the above answers are "NO", then may proceed with Cephalosporin use.    Past Medical History:  Diagnosis Date   CAD in native artery    a. 04/2016 NSTEMI with cath showing 25% MCx, 75% DX1 lesion and moderate LVD 35-45% out of proportion to CAD and in a pattern of Takotsubo CM (no clear stressor)   Carotid stenosis    Carotid Doppler showed 40 to 59% stenosis of the right carotid artery and less than 50% stenosis of the right common carotid artery.  There is 1 to 39% stenosis of the left internal carotid artery and greater than 50% stenosis of the common carotid artery on the left.  There is also left subclavian artery stenosis. by dopplers 05/2021   Dyslipidemia    GERD (gastroesophageal reflux disease)    Hypercholesteremia    Hypertension    Hypertensive heart disease    initially she was thought to have a HOCM variant by cath with increased LVOT gradient but echo showed only a gradient across the LVOT and SAM and cardiac MRI did not show any late gad enhancement and only a LVOT gradient with 40mm BSH.  There was some mild SAM and mild MR.  EF normal at 76%.     LVH (left ventricular hypertrophy)    a. cMRI 2018 - showing moderate Basal septal hypertrophy not classic for HOCM morphology with no delayed gadolium uptake to  suggest myofibrillar disarray, + SAM with small LVOT gradient in regard to physiology and mild MR.   Mild aortic stenosis    NSTEMI (non-ST elevated myocardial infarction) (HCC) 04/2016   secondary to stress CM - Takotsubo CM   PAC (premature atrial contraction)    Event monitors 10/2013 and 08/2014 showed NSR, ST, PACs, PVCs.    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 rate 14, height 5\' 2"  (1.575 m), weight 138 lb (62.6 kg), SpO2 92 %.  Essential hypertension, benign Patient with essential hypertension, currently not at BP goal.  Reviewed all medications with patient to help her understand uses.  Will have her increase losartan to 50 mg once daily and continue with all other medications.  She will need to repeat metabolic panel in 2-3 weeks and occasionally monitor home BP readings.  I asked that she reach out should they have any concerns.    PharmD CPP [facility] [address] [place], [place] [phone]

## 2021-09-01 NOTE — Assessment & Plan Note (Signed)
Patient with essential hypertension, currently not at BP goal.  Reviewed all medications with patient to help her understand uses.  Will have her increase losartan to 50 mg once daily and continue with all other medications.  She will need to repeat metabolic panel in 2-3 weeks and occasionally monitor home BP readings.  I asked that she reach out should they have any concerns.

## 2021-09-03 ENCOUNTER — Telehealth: Payer: Self-pay | Admitting: Cardiology

## 2021-09-03 DIAGNOSIS — R42 Dizziness and giddiness: Secondary | ICD-10-CM

## 2021-09-03 NOTE — Telephone Encounter (Signed)
Called pt back regarding her c/o dizziness. Pt states that she has been dizzy for a while, but since increasing the losartan she feels that the episodes of dizziness are worsening in intensity, but denies increase in frequency. Pt states that dizziness happens at rest and with ambulation. Says she sits down when these feelings happen. Denies n/v, SOB, tiredness over baseline. Condones vision changes associated with the episodes and states that she doesn't feel her vision gets blurry, but that "its like a film comes over my eyes." Pt verified that she increase dose of Losartan from 25mg  daily to 50mg  daily on 09/01/21 and supplied following readings: 2/21 136/89 2/22 118/54 *asked pt to check BP while on the phone and pt states current BP is 123/61. Denies feeling dizzy at this time. Will forward to Levasy for advisement.

## 2021-09-03 NOTE — Telephone Encounter (Signed)
Cindy Reichert, MD  [name] K Caller: Unspecified (Today,  3:53 PM) Please get a 48 hour BP mointor and also have her check her Bp and HR when she starts to feel bad and let us know what it is         Bp monitor order placed and pt called with instruction to check BP and Hr when she feels bad. Pt states that she understands

## 2021-09-03 NOTE — Telephone Encounter (Signed)
Pt c/o medication issue:  1. Name of Medication: losartan (COZAAR) 50 MG tablet  2. How are you currently taking this medication (dosage and times per day)? Patient now taking 50 mg daily. She was taking 25 mg daily   3. Are you having a reaction (difficulty breathing--STAT)?   4. What is your medication issue? Dizziness     STAT if patient feels like he/she is going to faint   Are you dizzy now? no  Do you feel faint or have you passed out? No. Patient is sitting on the couch   Do you have any other symptoms? no  Have you checked your HR and BP (record if available)?  118/54 HR 2  Patient is not sure if the change in medication is causing the dizziness or not. Please advise

## 2021-09-25 ENCOUNTER — Telehealth: Payer: Self-pay | Admitting: *Deleted

## 2021-09-25 NOTE — Telephone Encounter (Signed)
Called patient to schedule 24 hour ambulatory blood pressure monitor.   ?Patient needs to check on possible transportation, she will call me back Monday-Thursday to schedule appointment once transportation confirmed. ? ?Patient has Left Subclavian Stenosis- place BP cuff on right arm. ?

## 2021-10-02 ENCOUNTER — Encounter

## 2021-10-02 MED ORDER — CHLORTHALIDONE 25 MG TAB
25 mg | ORAL_TABLET | ORAL | 3 refills | Status: AC
Start: 2021-10-02 — End: ?

## 2021-10-02 MED ORDER — TELMISARTAN 80 MG TAB
80 mg | ORAL_TABLET | ORAL | 3 refills | Status: AC
Start: 2021-10-02 — End: ?

## 2021-10-06 ENCOUNTER — Ambulatory Visit
Admission: RE | Admit: 2021-10-06 | Discharge: 2021-10-06 | Disposition: A | Discharge status: Home / Self Care | Payer: Medicare Other | Source: Ambulatory Visit | Attending: Family Medicine | Admitting: Family Medicine

## 2021-10-06 DIAGNOSIS — M85832 Other specified disorders of bone density and structure, left forearm: Secondary | ICD-10-CM | POA: Diagnosis not present

## 2021-10-06 DIAGNOSIS — Z1231 Encounter for screening mammogram for malignant neoplasm of breast: Secondary | ICD-10-CM

## 2021-10-06 DIAGNOSIS — M810 Age-related osteoporosis without current pathological fracture: Secondary | ICD-10-CM | POA: Diagnosis not present

## 2021-10-06 DIAGNOSIS — M8580 Other specified disorders of bone density and structure, unspecified site: Secondary | ICD-10-CM

## 2021-10-06 DIAGNOSIS — Z780 Asymptomatic menopausal state: Secondary | ICD-10-CM | POA: Diagnosis not present

## 2021-10-09 ENCOUNTER — Ambulatory Visit (INDEPENDENT_AMBULATORY_CARE_PROVIDER_SITE_OTHER): Payer: Medicare Other

## 2021-10-09 ENCOUNTER — Other Ambulatory Visit: Payer: Self-pay | Admitting: Cardiology

## 2021-10-09 DIAGNOSIS — Z79899 Other long term (current) drug therapy: Secondary | ICD-10-CM

## 2021-10-09 DIAGNOSIS — R42 Dizziness and giddiness: Secondary | ICD-10-CM | POA: Diagnosis not present

## 2021-10-09 DIAGNOSIS — I10 Essential (primary) hypertension: Secondary | ICD-10-CM

## 2021-10-09 NOTE — Progress Notes (Unsigned)
24 hour ambulatory blood pressure monitor applied to patients Right arm using standard adult cuff due to Left Subclavian Stenosis. ? ?Patient instructed to return monitor Friday, 10/10/21 or Monday 10/13/21 at the latest. ?

## 2021-10-13 ENCOUNTER — Telehealth: Payer: Self-pay | Admitting: *Deleted

## 2021-10-13 NOTE — Telephone Encounter (Signed)
Monitor was returned to office without cuff.  We need the black connector attached to the cuff.  This is to connect the cuff to our ambulatory blood pressure monitor and is not disposible.  Patient will mail connector back to our office. ?

## 2021-10-23 DIAGNOSIS — H04123 Dry eye syndrome of bilateral lacrimal glands: Secondary | ICD-10-CM | POA: Diagnosis not present

## 2021-10-23 DIAGNOSIS — H35363 Drusen (degenerative) of macula, bilateral: Secondary | ICD-10-CM | POA: Diagnosis not present

## 2021-10-23 DIAGNOSIS — H179 Unspecified corneal scar and opacity: Secondary | ICD-10-CM | POA: Diagnosis not present

## 2021-10-23 DIAGNOSIS — H524 Presbyopia: Secondary | ICD-10-CM | POA: Diagnosis not present

## 2021-10-23 DIAGNOSIS — H16223 Keratoconjunctivitis sicca, not specified as Sjogren's, bilateral: Secondary | ICD-10-CM | POA: Diagnosis not present

## 2021-10-31 DIAGNOSIS — Z89611 Acquired absence of right leg above knee: Secondary | ICD-10-CM | POA: Diagnosis not present

## 2021-10-31 DIAGNOSIS — I2510 Atherosclerotic heart disease of native coronary artery without angina pectoris: Secondary | ICD-10-CM | POA: Diagnosis not present

## 2021-10-31 DIAGNOSIS — I509 Heart failure, unspecified: Secondary | ICD-10-CM | POA: Diagnosis not present

## 2021-10-31 DIAGNOSIS — M25511 Pain in right shoulder: Secondary | ICD-10-CM | POA: Diagnosis not present

## 2021-10-31 DIAGNOSIS — I10 Essential (primary) hypertension: Secondary | ICD-10-CM | POA: Diagnosis not present

## 2021-10-31 DIAGNOSIS — I739 Peripheral vascular disease, unspecified: Secondary | ICD-10-CM | POA: Diagnosis not present

## 2021-10-31 DIAGNOSIS — I671 Cerebral aneurysm, nonruptured: Secondary | ICD-10-CM | POA: Diagnosis not present

## 2021-10-31 DIAGNOSIS — I5181 Takotsubo syndrome: Secondary | ICD-10-CM | POA: Diagnosis not present

## 2021-10-31 DIAGNOSIS — G546 Phantom limb syndrome with pain: Secondary | ICD-10-CM | POA: Diagnosis not present

## 2021-10-31 DIAGNOSIS — E7800 Pure hypercholesterolemia, unspecified: Secondary | ICD-10-CM | POA: Diagnosis not present

## 2021-11-19 DIAGNOSIS — M25511 Pain in right shoulder: Secondary | ICD-10-CM | POA: Diagnosis not present

## 2022-01-08 NOTE — Progress Notes (Signed)
Cardiology Office Note:    Date:  01/09/2022   ID:  [name], DOB 08/11/1940, MRN [id number]  PCP:  Maurice Small, MD  Cardiologist:  Armanda Magic, MD    Referring MD: Maurice Small, MD   Chief Complaint  Patient presents with   Coronary Artery Disease   Aortic Stenosis   Hypertension     History of Present Illness:    [name] is a 81 y.o. female with a hx of CAD 2nd to NSTEMI in 05/2016 felt due to Takotsubo CM, hypertensive heart diseae/atypical HOCM, mild aortic stenosis, posterior noncommunicating artery aneurysm (followed by Dr. 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    Cardiac cath 04/21/16 revealed 25 mCx, 75% ostial D1, LVEF 35-45% in pattern of Takotsubo CM, and a 55 mmHg LVOT gradient felt due to subvalvular stenosis. Echo 05/2016 showed EF 60-65%, grade 1 DD, moderate focal hypertrophy of the basilar septum with a narrowed LV outflow tract and mild SAM. There was minimal LVOT gradient of , felt possibly HOCM variant. She underwent cardiac MRI 07/2016 showing moderate basal septal hypertrophy not classic for HOCM morphology with no delayed gadolium uptake to suggest myofibrillar disarray, + SAM with small LVOT gradient in regard to physiology and mild MR, mild LAE, no delayed gad uptake. Patient has longstanding history of atypical chest discomfort.  Poor historian.  Last stress test August 2019 and 11/2019 both for same ongoing atypical CP were low risk.    She is here today for followup and is doing well.  She has chronic atypical CP and SOB that are stable and unchanged from when I saw her last.  She has chronic LE edema that is stable as well. She denies any PND, orthopnea,  palpitations or syncope. She is non compliant with her meds.   Past Medical History:  Diagnosis Date   CAD in native artery    a. 04/2016 NSTEMI with cath showing 25% MCx, 75% DX1 lesion and moderate LVD 35-45% out of  proportion to CAD and in a pattern of Takotsubo CM (no clear stressor)   Carotid stenosis    Carotid Doppler showed 40 to 59% stenosis of the right carotid artery and less than 50% stenosis of the right common carotid artery.  There is 1 to 39% stenosis of the left internal carotid artery and greater than 50% stenosis of the common carotid artery on the left.  There is also left subclavian artery stenosis. by dopplers 05/2021   Dyslipidemia    GERD (gastroesophageal reflux disease)    Hypercholesteremia    Hypertension    Hypertensive heart disease    initially she was thought to have a HOCM variant by cath with increased LVOT gradient but echo showed only a gradient across the LVOT and SAM and cardiac MRI did not show any late gad enhancement and only a LVOT gradient with 43mm BSH.  There was some mild SAM and mild MR.  EF normal at 76%.     LVH (left ventricular hypertrophy)    a. cMRI 2018 - showing moderate Basal septal hypertrophy not classic for HOCM morphology with no delayed gadolium uptake to suggest myofibrillar disarray, + SAM with small LVOT gradient in regard to physiology and mild MR.   Mild aortic stenosis    NSTEMI (non-ST elevated myocardial infarction) (HCC) 04/2016   secondary to stress CM - Takotsubo CM   PAC (premature atrial contraction)  Event monitors 10/2013 and 08/2014 showed NSR, ST, PACs, PVCs.    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   CARDIAC CATHETERIZATION N/A 04/21/2016   Procedure: Left Heart Cath and  Coronary Angiography;  Surgeon: Corky Crafts, MD;  Location: [facility] INVASIVE CV LAB;  Service: Cardiovascular;  Laterality: N/A;   IR GENERIC HISTORICAL  02/13/2016   IR RADIOLOGY PERIPHERAL GUIDED IV START 02/13/2016 Berdine Dance, MD [facility]INTER[facility]   IR GENERIC HISTORICAL  02/13/2016   IR US GUIDE VASC ACCESS LEFT 02/13/2016 Berdine Dance, MD [facility]    Current Medications: Current Meds  Medication Sig   acetaminophen (TYLENOL) 325 MG tablet Take 2 tablets (650 mg total) by mouth every 4 (four) hours as needed for headache or mild pain.   amLODipine (NORVASC) 2.5 MG tablet Take 1 tablet (2.5 mg total) by mouth daily.   aspirin EC 81 MG EC tablet Take 1 tablet (81 mg total) by mouth daily.   atorvastatin (LIPITOR) 80 MG tablet Take 1 tablet (80 mg total) by mouth daily.   Calcium Carbonate-Vitamin D (CALCIUM-D PO) Take 1 tablet by mouth daily.   carvedilol (COREG) 12.5 MG tablet Take 1 tablet (12.5 mg total) by mouth 2 (two) times daily.   ezetimibe (ZETIA) 10 MG tablet Take 1 tablet (10 mg total) by mouth daily.   fluticasone (FLONASE) 50 MCG/ACT nasal spray Place 1 spray into both nostrils daily as needed (congestion).    fluticasone (FLOVENT HFA) 110 MCG/ACT inhaler Inhale 1 puff into the lungs as needed.    gabapentin (NEURONTIN) 300 MG capsule Take 300 mg by mouth 3 (three) times daily.   hydrochlorothiazide (HYDRODIURIL) 12.5 MG tablet Take 1 tablet (12.5 mg total) by mouth daily.   losartan (COZAAR) 50 MG tablet Take 1 tablet (50 mg total) by mouth daily.   Multiple Vitamin (MULTIVITAMIN WITH MINERALS) TABS tablet Take 1 tablet by mouth daily.   Omega-3 Fatty Acids (FISH OIL PO) Take 1 capsule by mouth daily at 2 PM.    omeprazole (PRILOSEC OTC) 20 MG tablet    vitamin E 45 MG (100 UNITS) capsule Take by mouth daily.     Allergies:   Other, Ace inhibitors, Codeine, and Penicillins   Social History   Socioeconomic History   Marital status: Single    Spouse name: Not on file    Number of children: Not on file   Years of education: Not on file   Highest education level: Not on file  Occupational History   Not on file  Tobacco Use   Smoking status: Former    Types: Cigarettes    Quit date: 07/13/2004    Years since quitting: 17.5   Smokeless tobacco: Never  Vaping Use   Vaping Use: Never used  Substance and Sexual Activity   Alcohol use: Yes    Comment: ocassionally    Drug use: No   Sexual activity: Not on file  Other Topics Concern   Not on file  Social  History Narrative   Not on file   Social Determinants of Health   Financial Resource Strain: Not on file  Food Insecurity: Not on file  Transportation Needs: Not on file  Physical Activity: Not on file  Stress: Not on file  Social Connections: Not on file     Family History: The patient's family history includes Heart attack in her brother, mother, and sister; Heart disease in her brother, mother, and sister.  ROS:   Please see the history of present illness.    ROS  All other systems reviewed and negative.   EKGs/Labs/Other Studies Reviewed:    The following studies were reviewed today: EKG  Steffanie Dunn 11/2019 Study Highlights  Nuclear stress EF: 63%. There was no ST segment deviation noted during stress. The study is normal. This is a low risk study. The left ventricular ejection fraction is normal (55-65%).  2D echo 11/2019 IMPRESSIONS    1. Left ventricular ejection fraction, by estimation, is 60 to 65%. The  left ventricle has normal function. The left ventricle has no regional  wall motion abnormalities. There is mild left ventricular hypertrophy of  the basal-septal segment. Left ventricular diastolic parameters are consistent with Grade I diastolic  dysfunction (impaired relaxation). Elevated left atrial pressure.   2. Right ventricular systolic function is normal. The right ventricular  size is normal. Tricuspid regurgitation signal is inadequate for assessing   PA pressure.   3. The mitral valve is normal in structure. Mild mitral valve  regurgitation. No evidence of mitral stenosis.   4. The aortic valve is normal in structure. Aortic valve regurgitation is  not visualized. No aortic stenosis is present.   5. The inferior vena cava is normal in size with greater than 50%  respiratory variability, suggesting right atrial pressure of 3 mmHg.   EKG:  EKG is not ordered today   Recent Labs: No results found for requested labs within last 365 days.   Recent Lipid Panel    Component Value Date/Time   CHOL 154 09/16/2020 1153   TRIG 60 09/16/2020 1153   HDL 55 09/16/2020 1153   CHOLHDL 2.8 09/16/2020 1153   CHOLHDL 3.9 04/22/2016 0017   VLDL 52 (H) 04/22/2016 0017   LDLCALC 87 09/16/2020 1153    Physical Exam:    VS:  BP (!) 148/90   Pulse 78   Ht 5\' 2"  (1.575 m)   Wt 137 lb (62.1 kg)   SpO2 95%   BMI 25.06 kg/m     Wt Readings from Last 3 Encounters:  01/09/22 137 lb (62.1 kg)  09/01/21 138 lb (62.6 kg)  07/15/21 138 lb 12.8 oz (63 kg)    GEN: Well nourished, well developed in no acute distress HEENT: Normal NECK: No JVD; bilateral carotid bruits LYMPHATICS: No lymphadenopathy CARDIAC:RRR, no murmurs, rubs, gallops RESPIRATORY:  Clear to auscultation without rales, wheezing or rhonchi  ABDOMEN: Soft, non-tender, non-distended MUSCULOSKELETAL:  No edema; No deformity  SKIN: Warm and dry NEUROLOGIC:  Alert and oriented x 3 PSYCHIATRIC:  Normal affect   ASSESSMENT:    1. CAD in native artery   2. 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 Takotsubo cardiomyopathy   4. Mild aortic stenosis   5. Hyperlipidemia, unspecified hyperlipidemia type    PLAN:    In order of problems listed above:  1.  ASCAD - Cardiac cath 04/21/16 revealed 25 mCx, 75% ostial D1, LVEF 35-45% in pattern of Takotsubo  CM, and a 55 mmHg LVOT gradient felt due to subvalvular  stenosis. -she has had chronic CP for years  -she is noncompliant with meds and currently has stopped carvedilol, amlodipine, Protonix and Zetia (had avoided nitrates in the past due to ? HOCM dx in the past) -Steffanie Dunn 11/2019 showed no ischemia -continue prescription drug management with ASA 81mg  daily, Atorvastatin 80mg  daily, carvedilol 12.5mg  BID with PRN refills  2.  Hypertensive heart disease  -BP borderline controlled on exam today but she has not taken her BP meds yet today --continue prescription drug management with Carvedilol 12.5mg  BID, HCTZ 12.5mg  daily, Losartan 50mg  daily and Amlodipine 2.5mg  daily with PRN refills -I have personally reviewed and interpreted outside labs performed by patient's PCP which showed SCr 0.99 and K+ 4.4 on 10/2021  3.  Takotsubo cardiomyopathy  -LVF normal on echo 2021 at 60-65%  -continue BB and ARB -repeat echo to reassess LVF  4.  Mild aortic stenosis  -no AS by echo 11/2019 -repeat 2D echo  5.  HLD -LDL goal < 70 -I have personally reviewed and interpreted outside labs performed by patient's PCP which showed LDL 51, HDL 39, TAGs 90 and ALT 15 on 10/2021 -continue prescription drug management with Atorvastatin 40mg  daily and Zetia 10mg  daily with PRN refills   6.  Bilateral carotid artery disease -40 to 59% stenosis of the right carotid artery and less than 50% stenosis of the right common carotid artery.  There is 1 to 39% stenosis of the left internal carotid artery and greater than 50% stenosis of the common carotid artery on the left.  There is also left subclavian artery stenosis>>dopplers 05/2021 -repeat dopplers 05/2022   Medication Adjustments/Labs and Tests Ordered: Current medicines are reviewed at length with the patient today.  Concerns regarding medicines are outlined above.  No orders of the defined types were placed in this encounter.   No orders of the defined types were placed in this encounter.   Signed, 11/2021, MD  01/09/2022 10:20 AM    [facility]

## 2022-01-09 ENCOUNTER — Ambulatory Visit (INDEPENDENT_AMBULATORY_CARE_PROVIDER_SITE_OTHER): Payer: Medicare Other | Admitting: Cardiology

## 2022-01-09 ENCOUNTER — Encounter: Payer: Self-pay | Admitting: Cardiology

## 2022-01-09 VITALS — BP 148/90 | HR 78 | Ht 62.0 in | Wt 137.0 lb

## 2022-01-09 DIAGNOSIS — E785 Hyperlipidemia, unspecified: Secondary | ICD-10-CM

## 2022-01-09 DIAGNOSIS — I5181 Takotsubo syndrome: Secondary | ICD-10-CM | POA: Diagnosis not present

## 2022-01-09 DIAGNOSIS — I5020 Unspecified systolic (congestive) heart failure: Secondary | ICD-10-CM

## 2022-01-09 DIAGNOSIS — I2510 Atherosclerotic heart disease of native coronary artery without angina pectoris: Secondary | ICD-10-CM | POA: Diagnosis not present

## 2022-01-09 DIAGNOSIS — I350 Nonrheumatic aortic (valve) stenosis: Secondary | ICD-10-CM | POA: Diagnosis not present

## 2022-01-09 DIAGNOSIS — I110 Hypertensive heart disease with heart failure: Secondary | ICD-10-CM

## 2022-01-09 NOTE — Patient Instructions (Signed)
Medication Instructions:  Your physician recommends that you continue on your current medications as directed. Please refer to the Current Medication list given to you today.   *If you need a refill on your cardiac medications before your next appointment, please call your pharmacy*  Testing/Procedures: Your physician has requested that you have an echocardiogram. Echocardiography is a painless test that uses sound waves to create images of your heart. It provides your doctor with information about the size and shape of your heart and how well your heart's chambers and valves are working. This procedure takes approximately one hour. There are no restrictions for this procedure.  Your physician has requested that you have a carotid duplex in November 2023. This test is an ultrasound of the carotid arteries in your neck. It looks at blood flow through these arteries that supply the brain with blood. Allow one hour for this exam. There are no restrictions or special instructions.  Follow-Up: At [facility], you and your health needs are our priority.  As part of our continuing mission to provide you with exceptional heart care, we have created designated Provider Care Teams.  These Care Teams include your primary Cardiologist (physician) and Advanced Practice Providers (APPs -  Physician Assistants and Nurse Practitioners) who all work together to provide you with the care you need, when you need it.  Your next appointment:   1 year(s)  The format for your next appointment:   In Person  Provider:   Armanda Magic, MD    Important Information About Sugar

## 2022-01-09 NOTE — Addendum Note (Signed)
Addended by: Theresia Majors on: 01/09/2022 10:27 AM   Modules accepted: Orders

## 2022-01-23 ENCOUNTER — Ambulatory Visit (HOSPITAL_COMMUNITY): Payer: Medicare Other | Attending: Cardiology

## 2022-01-23 DIAGNOSIS — I350 Nonrheumatic aortic (valve) stenosis: Secondary | ICD-10-CM | POA: Diagnosis not present

## 2022-01-23 DIAGNOSIS — I110 Hypertensive heart disease with heart failure: Secondary | ICD-10-CM | POA: Insufficient documentation

## 2022-01-23 DIAGNOSIS — I2510 Atherosclerotic heart disease of native coronary artery without angina pectoris: Secondary | ICD-10-CM | POA: Insufficient documentation

## 2022-01-23 DIAGNOSIS — I5181 Takotsubo syndrome: Secondary | ICD-10-CM | POA: Insufficient documentation

## 2022-01-23 DIAGNOSIS — I5020 Unspecified systolic (congestive) heart failure: Secondary | ICD-10-CM | POA: Insufficient documentation

## 2022-01-23 DIAGNOSIS — E785 Hyperlipidemia, unspecified: Secondary | ICD-10-CM | POA: Diagnosis not present

## 2022-01-23 LAB — ECHOCARDIOGRAM COMPLETE
Area-P 1/2: 2.24 cm2
MV M vel: 5.96 m/s
MV Peak grad: 142.1 mmHg
S' Lateral: 1.5 cm

## 2022-01-25 ENCOUNTER — Encounter: Payer: Self-pay | Admitting: Cardiology

## 2022-01-26 ENCOUNTER — Telehealth: Payer: Self-pay

## 2022-01-26 DIAGNOSIS — I421 Obstructive hypertrophic cardiomyopathy: Secondary | ICD-10-CM

## 2022-01-26 NOTE — Telephone Encounter (Signed)
-----   Message from Quintella Reichert, MD sent at 01/25/2022  9:18 AM EDT ----- Echo showed normal heart function with mild to moderate leakiness of MV and HOCM - please refer to Dr. Izora Ribas

## 2022-01-26 NOTE — Telephone Encounter (Signed)
The patient has been notified of the result and verbalized understanding.  All questions (if any) were answered. Theresia Majors, RN 01/26/2022 4:13 PM  Referral has been placed

## 2022-02-19 ENCOUNTER — Ambulatory Visit (INDEPENDENT_AMBULATORY_CARE_PROVIDER_SITE_OTHER): Payer: Medicare Other | Admitting: Internal Medicine

## 2022-02-19 ENCOUNTER — Institutional Professional Consult (permissible substitution): Payer: Medicare Other | Admitting: Internal Medicine

## 2022-02-19 ENCOUNTER — Encounter: Payer: Self-pay | Admitting: Internal Medicine

## 2022-02-19 VITALS — BP 152/50 | HR 61 | Ht 61.0 in | Wt 137.0 lb

## 2022-02-19 DIAGNOSIS — I421 Obstructive hypertrophic cardiomyopathy: Secondary | ICD-10-CM

## 2022-02-19 DIAGNOSIS — I10 Essential (primary) hypertension: Secondary | ICD-10-CM

## 2022-02-19 MED ORDER — SPIRONOLACTONE 25 MG PO TABS: 12.5000 mg | ORAL_TABLET | Freq: Every day | ORAL | 3 refills | Status: DC

## 2022-02-19 NOTE — Progress Notes (Signed)
Cardiology Office Note:    Date:  02/19/2022   ID:  KELLEIGH SKERRITT, DOB 02/12/1941, MRN [id number]  PCP:  Maurice Small, MD   [facility] Providers Cardiologist:  Armanda Magic, MD     Referring MD: Quintella Reichert, MD   CC: LVOT Obstruction  History of Present Illness:    RAYSA BOSAK is a 81 y.o. 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  CMR septal thickness of 16 mm without LGE.  Had PAD and has a prosthesis after she lost her leg Had severe LVOT obstruction in recent echocardiogram; LVOT gradient may be a bit over-measured; gradient ~ 43 mm Hg  Notes that she first started seeing a doctor regularly at age 24; did not have high blood pressure. 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  She has two sons; both of whom have heart problems, neither of whom have hypertrophic cardiomyopathy. Mother died of MI and HTN in her 31s. Brother died suddenly after HD session.  Patient notes SOB at rest and DOE. Has chest pain with exertion Notes increase fatigue. Notes some palpitations Notes no syncope.  Complains of hand pain.  Past Medical History:  Diagnosis Date   CAD in native artery    a. 04/2016 NSTEMI with cath showing 25% MCx, 75% DX1 lesion and moderate LVD 35-45% out of proportion to CAD and in a pattern of Takotsubo CM (no clear stressor)   Carotid stenosis    Carotid Doppler showed 40 to 59% stenosis of the right carotid artery and less than 50% stenosis of the right common carotid artery.  There is 1 to 39% stenosis of the left internal carotid artery and greater than 50% stenosis of the common carotid artery on the left.  There is also left subclavian artery stenosis. by dopplers 05/2021   Dyslipidemia    GERD (gastroesophageal reflux disease)    HOCM (hypertrophic obstructive cardiomyopathy) (HCC)     Hypercholesteremia    Hypertension    Hypertensive heart disease    initially she was thought to have a HOCM variant by cath with increased LVOT gradient but echo showed only a gradient across the LVOT and SAM and cardiac MRI did not show any late gad enhancement and only a LVOT gradient with 37mm BSH.  There was some mild SAM and mild MR.  EF normal at 76%.     LVH (left ventricular hypertrophy)    a. cMRI 2018 - showing moderate Basal septal hypertrophy not classic for HOCM morphology with no delayed gadolium uptake to suggest myofibrillar disarray, + SAM with small LVOT gradient in regard to physiology and mild MR.   Mild aortic stenosis    NSTEMI (non-ST elevated myocardial infarction) (HCC) 04/2016   secondary to stress CM - Takotsubo CM   PAC (premature atrial contraction)    Event monitors 10/2013 and 08/2014 showed NSR, ST, PACs, PVCs.    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   CARDIAC CATHETERIZATION N/A 04/21/2016   Procedure: Left Heart Cath and Coronary Angiography;  Surgeon: Corky Crafts, MD;  Location: [facility] INVASIVE CV LAB;  Service: Cardiovascular;  Laterality: N/A;   IR GENERIC HISTORICAL  02/13/2016   IR RADIOLOGY PERIPHERAL GUIDED IV START 02/13/2016 Berdine Dance, MD [facility]INTER[facility]   IR GENERIC HISTORICAL  02/13/2016   IR US GUIDE VASC ACCESS LEFT 02/13/2016 Berdine Dance, MD [facility]    Current Medications: Current Meds  Medication Sig   acetaminophen (TYLENOL) 325 MG tablet Take 2 tablets (650 mg total) by  mouth every 4 (four) hours as needed for headache or mild pain.   alendronate (FOSAMAX) 70 MG tablet once a week.   aspirin EC 81 MG EC tablet Take 1 tablet (81 mg total) by mouth daily.   atorvastatin (LIPITOR) 80 MG tablet Take 1 tablet (80 mg total) by mouth daily.   Calcium Carbonate-Vitamin D (CALCIUM-D PO) Take 1 tablet by mouth daily.   carvedilol (COREG) 12.5 MG tablet Take 1 tablet (12.5 mg total) by mouth 2 (two) times daily.   ezetimibe (ZETIA) 10 MG tablet Take 1 tablet (10 mg total) by mouth daily.   fluticasone (FLONASE) 50 MCG/ACT nasal spray Place 1 spray into both nostrils daily as needed (congestion).    fluticasone (FLOVENT HFA) 110 MCG/ACT inhaler Inhale 1 puff into the lungs as needed.    gabapentin (NEURONTIN) 300 MG capsule Take 300 mg by mouth 3 (three) times daily.   losartan (COZAAR) 50 MG tablet Take 1 tablet (50 mg total) by mouth daily.   Multiple Vitamin (MULTIVITAMIN WITH MINERALS) TABS tablet Take 1 tablet by mouth daily.   Omega-3 Fatty Acids (FISH OIL PO) Take 1 capsule by mouth daily at 2 PM.    omeprazole (PRILOSEC OTC) 20 MG tablet    spironolactone (ALDACTONE) 25 MG tablet Take 0.5 tablets (12.5 mg total) by mouth daily.   vitamin E 45 MG (100 UNITS) capsule Take by mouth daily.   [DISCONTINUED] amLODipine (NORVASC) 2.5 MG tablet Take 1 tablet (2.5 mg total) by mouth daily.   [DISCONTINUED] hydrochlorothiazide (HYDRODIURIL) 12.5 MG tablet Take 1 tablet (12.5 mg total) by mouth daily.     Allergies:   Other, Ace inhibitors, Codeine, and Penicillins   Social History   Socioeconomic History   Marital status: Single    Spouse name: Not on file   Number of children: Not on file   Years of education: Not on file   Highest education level: Not on file  Occupational History   Not on file  Tobacco Use   Smoking status: Former    Types: Cigarettes    Quit date: 07/13/2004    Years since quitting: 17.6   Smokeless tobacco: Never  Vaping Use   Vaping  Use: Never used  Substance and Sexual Activity   Alcohol use: Yes    Comment: ocassionally    Drug use: No   Sexual activity: Not on file  Other Topics Concern   Not on file  Social History Narrative   Not on file   Social Determinants of Health   Financial Resource Strain: Not on file  Food Insecurity: Not on file  Transportation Needs: Not on file  Physical Activity: Not on file  Stress: Not on file  Social Connections: Not on file     Family History: The patient's family history  includes Heart attack in her brother, mother, and sister; Heart disease in her brother, mother, and sister.  ROS:   Please see the history of present illness.     All other systems reviewed and are negative.  EKGs/Labs/Other Studies Reviewed:    The following studies were reviewed today:  LEFT HEART CATH AND CORONARY ANGIOGRAPHY 04/21/2016  Narrative  Mid Cx lesion, 25 %stenosed.  Ost 1st Diag lesion, 75 %stenosed.  The left ventricular ejection fraction is 35-45% by visual estimate.  There is mild to moderate left ventricular systolic dysfunction in a pattern of Takotsubo cardiomyopathy.  LV end diastolic pressure is mildly elevated.  50-55 mm Hg resting gradient across the LVOT, likely due to subvalvular stenosis. No difficulty crossing the aortic valve.  Severe tortuosity of the right subclavian making torquing catheters difficult. Consider left radial if cath needed in the future.  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  ACE-I in AM if renal function stable.  No clear source of stress that may have caused this episode.   GATED SPECT MYO PERF W/LEXISCAN STRESS 1D 11/24/2019  Narrative  Nuclear stress EF: 63%.  There was no ST segment deviation noted during stress.  The study is normal.  This is a low risk study.  The left ventricular ejection fraction is normal (55-65%).     ECHO COMPLETE WO IMAGING ENHANCING AGENT  01/23/2022  Narrative ECHOCARDIOGRAM REPORT    Patient Name:   Anne NgSHIRLEY L Rossell Date of Exam: 01/23/2022 Medical Rec #:  [id number]       Height:       62.0 in Accession #:    [id number]      Weight:       137.0 lb Date of Birth:  April 06, 1941       BSA:          1.628 m Patient Age:    81 years        BP:           148/90 mmHg Patient Gender: F               HR:           72 bpm. Exam Location:  [address]  Procedure: 2D Echo, Cardiac Doppler and Color Doppler  Indications:    I35.0 Nonrheumatic aortic (valve) stenosis  History:        Patient has prior history of Echocardiogram examinations, most recent 11/24/2019. CHF, CAD and Previous Myocardial Infarction, Arrythmias:PVC and PAC; Risk Factors:Dyslipidemia. Palpitations. Hypertensive heart disease. PVD. Takotsubo cardiomyopathy. LVH. Left subclavian artery stensosis. Brain aneurysm.  Sonographer:    Cathie BeamsAngela Gregory RCS Referring Phys: [phone] TRACI R TURNER  IMPRESSIONS   1. Left ventricular ejection fraction, by estimation, is 60 to 65%. The left ventricle has normal function. The left ventricle has no regional wall motion abnormalities. There is severe asymmetric left ventricular hypertrophy of the basal-septal segment. LVOT gradient due to SAM, measures up to 55mmHg at rest and 73mmHg with Valsalva 2. Right ventricular systolic function is normal. The right ventricular size is normal. 3. The mitral valve is normal in structure. Mild to moderate mitral valve regurgitation. No evidence of mitral stenosis. [facility] 4. The aortic valve is tricuspid. Aortic valve regurgitation is not visualized. Aortic valve sclerosis is present, with no evidence of aortic valve stenosis.  Conclusion(s)/Recommendation(s): Findings consistent with HOCM. Prior MRI from 2018 is poor quality, would recommend repeating cardiac MRI.  CARDIAC TELEMETRY MONITORING-INTERPRETATION ONLY 03/24/2019  Narrative  Sinus bradycardia, normal sinus  rhythm and sinus tachycardia with average heart rate 61bpm.  Occasional PVCs with PVC load 1%.   CT CORONARY MORPH W/CTA COR W/SCORE W/CA W/CM &/OR WO/CM 02/13/2016  Addendum 02/13/2016  5:40 PM ADDENDUM REPORT: 02/13/2016 17:38  CLINICAL DATA:  Chest pain  EXAM: Cardiac CTA  MEDICATIONS: Sub lingual nitro. 4mg  and lopressor 10mg   TECHNIQUE: Patient was a difficult iv cannulation. Dr eventually used micro-puncture technique in IR radiology to start iv  The patient was scanned on a Philips 256 slice scanner. Gantry rotation speed was 270 msecs. Collimation was .62mm. A 100 kV prospective scan was triggered in the descending thoracic aorta at 111 HU's with 5% padding centered around 78% of the R-R interval. Average HR during the scan was 72 bpm. The 3D data set was interpreted on a dedicated work station using MPR, MIP and VRT modes. A total of 80cc of contrast was used.  FINDINGS: Non-cardiac: See separate report from [facility]. No significant findings on limited lung and soft tissue windows.  Calcium Score:  2 with isolated area of calcification in LM  Coronary Arteries: Right dominant with no anomalies  LM:  Small area of non obstructive calcium  LAD:  Normal tortuous  D1:  Normal large and branching  Circumflex:  Left dominant normal  OM1: Normal  OM2: Normal  RCA:  Small and non dominant normal  PDA:  Left sided normal  PLA:  Left sided normal  IMPRESSION: 1) Poor quality study due to motion artifact and somewhat poor filling of coronary arteries with tortuosity  2) Calcium score 2 isolated foci in LM 23rd percentile for age and sex matched controls  3) Normal left dominant coronary arteries with noted motion artifact and poor distal vessel visualization  10m   Electronically Signed By: [facility] M.D. On: 02/13/2016 17:38  Narrative EXAM: OVER-READ INTERPRETATION  CT CHEST  The following report is an  over-read performed by radiologist Dr. Charlton Haws [facility] on 02/13/2016. This over-read does not include interpretation of cardiac or coronary anatomy or pathology. The coronary calcium score/coronary CTA interpretation by the cardiologist is attached.  COMPARISON:  None.  FINDINGS: Aortic atherosclerosis. Within the visualized portions of the thorax there are no suspicious appearing pulmonary nodules or masses, there is no acute consolidative airspace disease, no pleural effusions, no pneumothorax and no lymphadenopathy. Visualized portions of the upper abdomen again demonstrate aortic atherosclerosis. High density material in the collecting systems of the kidneys bilaterally, presumably contrast material related to test bolus injection. There are no aggressive appearing lytic or blastic lesions noted in the visualized portions of the skeleton.  IMPRESSION: 1. Aortic atherosclerosis. 2. No acute incidental noncardiac findings noted.  Electronically Signed: By: [facility] M.D. On: 02/13/2016 16:32   No results found for this or any previous visit from the past 3650 days.   MR CARDIAC MORPHOLOGY W WO CONTRAST 07/27/2016  Narrative CLINICAL DATA:  Hypertrophic Cardiomyopathy  EXAM: CARDIAC MRI  TECHNIQUE: The patient was scanned on a 1.5 Tesla GE magnet. A dedicated cardiac coil was used. Functional imaging was done using Fiesta sequences. 2,3, and 4 chamber views were done to assess for RWMA's. Modified Simpson's rule using a short axis stack was used to calculate an ejection fraction on a dedicated work 04/14/2016. The patient received 21 cc of Multihance. After 10 minutes inversion recovery sequences were used to assess for infiltration and scar tissue.  CONTRAST:  21 cc Multihance  FINDINGS: Thee was mild LAE. The RA/RV were normal in size and function. There was no ASD/VSD or pericardial effusion. The aortic root was  normal. The aortic valve was trileaflet with no stenosis. There was moderate basal septal hypertrophy 16 mm compared to the mid septum and posterior wall at 8 mm. There appear to be SAM. By flow analysis only a mild LVOT gradient of 5 mmHg. There was mild MR  The quantitative EF was 76% (EDV 62 cc SV 47 cc EDV 16 cc) Delayed enhancement images post gadolinium were normal with no evidence of scar or myofibrillar disarray in the septum  IMPRESSION: 1) Moderate Basal septal hypertrophy not classic for HOCM morphology with no delayed gadolium uptake to suggest myofibrillar disarray (16 mm BSH)  2) There was SAM with small LVOT gradient in regard to physiology and mild MR  3) Mild LAE  4) No delayed gadolinium uptake on inversion recovery sequences.  Peter Nishan     VAS US CAROTID DUPLEX BILATERAL 05/16/2021  Summary: Right Carotid: Velocities in the right ICA are consistent with a 40-59% stenosis. Non-hemodynamically significant plaque <50% noted in the CCA. The ECA appears >50% stenosed. Stenosis based on peak systolic velocities and plaque formation.  Left Carotid: Velocities in the left ICA are consistent with a 1-39% stenosis. Hemodynamically significant plaque >50% visualized in the CCA. The ECA appears >50% stenosed.  Vertebrals:  Bilateral vertebral arteries demonstrate antegrade flow. Subclavians: Left subclavian artery was stenotic. Bilateral subclavian artery flow was disturbed.  *See table(s) above for measurements and observations. Suggest follow up study in 12 months.   Recent Labs: No results found for requested labs within last 365 days.  Recent Lipid Panel    Component Value Date/Time   CHOL 154 09/16/2020 1153   TRIG 60 09/16/2020 1153   HDL 55 09/16/2020 1153   CHOLHDL 2.8 09/16/2020 1153   CHOLHDL 3.9 04/22/2016 0017   VLDL 52 (H) 04/22/2016 0017   LDLCALC 87 09/16/2020 1153       Physical Exam:    VS:  BP (!) 152/50   Pulse 61   Ht 5\' 1"   (1.549 m)   Wt 137 lb (62.1 kg)   SpO2 98%   BMI 25.89 kg/m     Wt Readings from Last 3 Encounters:  02/19/22 137 lb (62.1 kg)  01/09/22 137 lb (62.1 kg)  09/01/21 138 lb (62.6 kg)     GEN: Elderly female in no acute distress HEENT: Normal NECK: No JVD; No carotid bruits LYMPHATICS: No lymphadenopathy CARDIAC: RRR, Systolic murmur at rest more prominent with Valsalva, rubs, gallops RESPIRATORY:  Clear to auscultation without rales, wheezing or rhonchi  ABDOMEN: Soft, non-tender, non-distended MUSCULOSKELETAL:  Trace edemal left leg; R AKA SKIN: Warm and dry NEUROLOGIC:  Alert and oriented x 3 PSYCHIATRIC:  Normal affect   ASSESSMENT:    1. HOCM (hypertrophic obstructive cardiomyopathy) (HCC)   2. Hypertension, essential    PLAN:    Hypertrophic Cardiomyopathy vs Hypertensive Heart disease  - unclear per history whether this is related to hypertension based on her history - will repeat CMR  - gradient may be over-estimated but still have a significant LVOT gradient, not necessarily severe; she has an LVOT obstruction and associated MR - NYHA II-III - we will attempt to control BP by conserving preload and afterload so as to minimize LVOT gradient - we stopped HCTZ and norvasc; will add aldactone 25 mg PO Daily; patient does AMB  BP checks (last 140s) and we will f/u during f/u labs, may increase dose - continue coreg, would ideally decrease losartan if we controlling BP - discussed hydration - we have discussed that if her symptoms are worse that we have other options (given age and comorbidity would offer CMIs but not SRT)   We have discussed the diagnosis as it relates to her two sons; I will likely not recommend genetic testing unless we are in need of a diagnosis; her sons get imaging follow up for their hearts in the [facility] system  Time Spent Directly with Patient:   I have spent a total of 60 minutes with the patient reviewing notes, imaging, EKGs, labs and examining  the patient as well as establishing an assessment and plan that was discussed personally with the patient.  > 50% of time was spent in direct patient care and/or  family and reviewing HCM with patient.  Winter follow with me        Medication Adjustments/Labs and Tests Ordered: Current medicines are reviewed at length with the patient today.  Concerns regarding medicines are outlined above.  Orders Placed This Encounter  Procedures   MR CARDIAC MORPHOLOGY W WO CONTRAST   Basic metabolic panel   CBC   Meds ordered this encounter  Medications   spironolactone (ALDACTONE) 25 MG tablet    Sig: Take 0.5 tablets (12.5 mg total) by mouth daily.    Dispense:  45 tablet    Refill:  3    Patient Instructions  Medication Instructions:  Your physician has recommended you make the following change in your medication:  STOP: amlodipine (Norvasc) STOP: HCTZ START: spironolactone (Aldactone) 12.5 mg by mouth once daily  *If you need a refill on your cardiac medications before your next appointment, please call your pharmacy*   Lab Work: IN 1-2 WEEKS: BMP, CBC  If you have labs (blood work) drawn today and your tests are completely normal, you will receive your results only by: MyChart Message (if you have MyChart) OR A paper copy in the mail If you have any lab test that is abnormal or we need to change your treatment, we will call you to review the results.   Testing/Procedures:  Your physician has requested that you have a cardiac MRI. Cardiac MRI uses a computer to create images of your heart as its beating, producing both still and moving pictures of your heart and major blood vessels. For further information please visit [email or web address]. Please follow the instruction sheet given to you today for more information.    Follow-Up: At [facility], you and your health needs are our priority.  As part of our continuing mission to provide you with exceptional heart care, we have  created designated Provider Care Teams.  These Care Teams include your primary Cardiologist (physician) and Advanced Practice Providers (APPs -  Physician Assistants and Nurse Practitioners) who all work together to provide you with the care you need, when you need it.  We recommend signing up for the patient portal called "MyChart".  Sign up information is provided on this After Visit Summary.  MyChart is used to connect with patients for Virtual Visits (Telemedicine).  Patients are able to view lab/test results, encounter notes, upcoming appointments, etc.  Non-urgent messages can be sent to your provider as well.   To learn more about what you can do with MyChart, go to [email or web address].    Your next appointment:   2 -3 month(s)  The format for your  next appointment:   In Person  Provider:  Riley Lam, MD  Other Instructions  You are scheduled for Cardiac MRI on ______________. Please arrive for your appointment at ______________ ( arrive 30-45 minutes prior to test start time). ?  [facility] [address] [place], [place] [address] [phone] Please take advantage of the free valet parking available at the MAIN entrance (A entrance).  Proceed to the [facility] Radiology Department (First Floor) for check-in.   OR   [facility] [address] [place], [place] [address] [phone] Please take advantage of the free valet parking available at the MAIN entrance. Proceed to [facility] registration for check-in (first floor).  Magnetic resonance imaging (MRI) is a painless test that produces images of the inside of the body without using Xrays.  During an MRI, strong magnets and radio waves work together in a Data processing manager to form detailed images.   MRI images may provide more details about a medical condition than X-rays, CT scans, and ultrasounds can provide.  You may be given earphones to listen for instructions.  You  may eat a light breakfast and take medications as ordered with the exception of HCTZ (fluid pill, other). Please avoid stimulants for 12 hr prior to test. (Ie. Caffeine, nicotine, chocolate, or antihistamine medications)  If a contrast material will be used, an IV will be inserted into one of your veins. Contrast material will be injected into your IV. It will leave your body through your urine within a day. You may be told to drink plenty of fluids to help flush the contrast material out of your system.  You will be asked to remove all metal, including: Watch, jewelry, and other metal objects including hearing aids, hair pieces and dentures. Also wearable glucose monitoring systems (ie. Freestyle Libre and Omnipods) (Braces and fillings normally are not a problem.)   TEST WILL TAKE APPROXIMATELY 1 HOUR  PLEASE NOTIFY SCHEDULING AT LEAST 24 HOURS IN ADVANCE IF YOU ARE UNABLE TO KEEP YOUR APPOINTMENT. [phone]  Please call Rockwell Alexandria, cardiac imaging nurse navigator with any questions/concerns. Rockwell Alexandria RN Navigator Cardiac Imaging Larey Brick RN Navigator Cardiac Imaging [facility] [phone] Office    Important Information About Sugar         Signed, Christell Constant, MD  02/19/2022 4:15 PM    [facility]

## 2022-02-19 NOTE — Patient Instructions (Addendum)
Medication Instructions:  Your physician has recommended you make the following change in your medication:  STOP: amlodipine (Norvasc) STOP: HCTZ START: spironolactone (Aldactone) 12.5 mg by mouth once daily  *If you need a refill on your cardiac medications before your next appointment, please call your pharmacy*   Lab Work: IN 1-2 WEEKS: BMP, CBC  If you have labs (blood work) drawn today and your tests are completely normal, you will receive your results only by: MyChart Message (if you have MyChart) OR A paper copy in the mail If you have any lab test that is abnormal or we need to change your treatment, we will call you to review the results.   Testing/Procedures:  Your physician has requested that you have a cardiac MRI. Cardiac MRI uses a computer to create images of your heart as its beating, producing both still and moving pictures of your heart and major blood vessels. For further information please visit [email or web address]. Please follow the instruction sheet given to you today for more information.    Follow-Up: At [facility], you and your health needs are our priority.  As part of our continuing mission to provide you with exceptional heart care, we have created designated Provider Care Teams.  These Care Teams include your primary Cardiologist (physician) and Advanced Practice Providers (APPs -  Physician Assistants and Nurse Practitioners) who all work together to provide you with the care you need, when you need it.  We recommend signing up for the patient portal called "MyChart".  Sign up information is provided on this After Visit Summary.  MyChart is used to connect with patients for Virtual Visits (Telemedicine).  Patients are able to view lab/test results, encounter notes, upcoming appointments, etc.  Non-urgent messages can be sent to your provider as well.   To learn more about what you can do with MyChart, go to [email or web address].    Your next  appointment:   2 -3 month(s)  The format for your next appointment:   In Person  Provider:  Riley Lam, MD  Other Instructions  You are scheduled for Cardiac MRI on ______________. Please arrive for your appointment at ______________ ( arrive 30-45 minutes prior to test start time). ?  [facility] [address] [place], [place] [address] [phone] Please take advantage of the free valet parking available at the MAIN entrance (A entrance).  Proceed to the [facility] Radiology Department (First Floor) for check-in.   OR   [facility] [address] [place], [place] [address] [phone] Please take advantage of the free valet parking available at the MAIN entrance. Proceed to [facility] registration for check-in (first floor).  Magnetic resonance imaging (MRI) is a painless test that produces images of the inside of the body without using Xrays.  During an MRI, strong magnets and radio waves work together in a Data processing manager to form detailed images.   MRI images may provide more details about a medical condition than X-rays, CT scans, and ultrasounds can provide.  You may be given earphones to listen for instructions.  You may eat a light breakfast and take medications as ordered with the exception of HCTZ (fluid pill, other). Please avoid stimulants for 12 hr prior to test. (Ie. Caffeine, nicotine, chocolate, or antihistamine medications)  If a contrast material will be used, an IV will be inserted into one of your veins. Contrast material will be injected into your IV. It will leave your body through your urine within a  day. You may be told to drink plenty of fluids to help flush the contrast material out of your system.  You will be asked to remove all metal, including: Watch, jewelry, and other metal objects including hearing aids, hair pieces and dentures. Also wearable glucose monitoring systems (ie. Freestyle Libre and  Omnipods) (Braces and fillings normally are not a problem.)   TEST WILL TAKE APPROXIMATELY 1 HOUR  PLEASE NOTIFY SCHEDULING AT LEAST 24 HOURS IN ADVANCE IF YOU ARE UNABLE TO KEEP YOUR APPOINTMENT. [phone]  Please call Rockwell Alexandria, cardiac imaging nurse navigator with any questions/concerns. Rockwell Alexandria RN Navigator Cardiac Imaging Larey Brick RN Navigator Cardiac Imaging [facility] [phone] Office    Important Information About Sugar

## 2022-02-23 ENCOUNTER — Telehealth: Payer: Self-pay | Admitting: Internal Medicine

## 2022-02-23 NOTE — Telephone Encounter (Signed)
Patient called to check if her follow-up visit with Dr. Izora Ribas should be postponed to after her tests in November.

## 2022-02-25 NOTE — Telephone Encounter (Signed)
Called pt rescheduled OV until after Cmri.  Request afternoon OV 06/10/22 at 4 pm.  No further questions or concerns.

## 2022-02-26 ENCOUNTER — Other Ambulatory Visit: Payer: Medicare Other

## 2022-02-26 DIAGNOSIS — I421 Obstructive hypertrophic cardiomyopathy: Secondary | ICD-10-CM

## 2022-02-26 DIAGNOSIS — I10 Essential (primary) hypertension: Secondary | ICD-10-CM | POA: Diagnosis not present

## 2022-02-27 LAB — CBC
Hematocrit: 33.5 % — ABNORMAL LOW (ref 34.0–46.6)
Hemoglobin: 10.8 g/dL — ABNORMAL LOW (ref 11.1–15.9)
MCH: 29.5 pg (ref 26.6–33.0)
MCHC: 32.2 g/dL (ref 31.5–35.7)
MCV: 92 fL (ref 79–97)
Platelets: 152 10*3/uL (ref 150–450)
RBC: 3.66 x10E6/uL — ABNORMAL LOW (ref 3.77–5.28)
RDW: 12.9 % (ref 11.7–15.4)
WBC: 6.8 10*3/uL (ref 3.4–10.8)

## 2022-02-27 LAB — BASIC METABOLIC PANEL
BUN/Creatinine Ratio: 12 (ref 12–28)
BUN: 10 mg/dL (ref 8–27)
CO2: 22 mmol/L (ref 20–29)
Calcium: 9.2 mg/dL (ref 8.7–10.3)
Chloride: 103 mmol/L (ref 96–106)
Creatinine, Ser: 0.85 mg/dL (ref 0.57–1.00)
Glucose: 79 mg/dL (ref 70–99)
Potassium: 4.5 mmol/L (ref 3.5–5.2)
Sodium: 138 mmol/L (ref 134–144)
eGFR: 68 mL/min/{1.73_m2} (ref >59)

## 2022-03-02 ENCOUNTER — Telehealth: Payer: Self-pay | Admitting: Internal Medicine

## 2022-03-02 MED ORDER — HYDROCHLOROTHIAZIDE 12.5 MG PO CAPS: 12.5000 mg | ORAL_CAPSULE | Freq: Every day | ORAL | 3 refills | Status: DC

## 2022-03-02 NOTE — Telephone Encounter (Signed)
Per Dr. Izora Ribas, Add back the HCTZ 12.5 mg PO Daily.  If not improvement over the next few days we will increase aldactone to 25 mg PO daily.  Glad the swelling has resolved off norvasc.  Called patient with Dr. Garnetta Buddy advisement. Patient verbalized understanding and will call back if BP does not go down.

## 2022-03-02 NOTE — Telephone Encounter (Signed)
Called patient back about message. Patient complaining of elevated BP with headache, and blurred vision on Sunday. Patient denies any SOB, chest pain or swelling. Patient stated the swelling has went completely down. Patient's HR 55 to 66. Patient's current BP medications are Spironolactone 12.5 mg daily, losartan 50 mg daily, and Coreg 12.5 mg BID. Patient was recently changed from amlodipine and HCTZ to Spironolactone at office visit on 02/19/22. Will send message to Dr. Izora Ribas for advisement.

## 2022-03-02 NOTE — Telephone Encounter (Signed)
Pt c/o BP issue: STAT if pt c/o blurred vision, one-sided weakness or slurred speech  1. What are your last 5 BP readings?   167/64   168/64 161/58 137/64 141/57 188/64 150/58 168/42 157/66 154/60 176/56 151/55 135/58  2. Are you having any other symptoms (ex. Dizziness, headache, blurred vision, passed out)?   Headache and blurred vision  3. What is your BP issue?   Patient called concerned about having a headache with her increased  BP since Saturday.

## 2022-03-06 ENCOUNTER — Telehealth: Payer: Self-pay

## 2022-03-06 DIAGNOSIS — I10 Essential (primary) hypertension: Secondary | ICD-10-CM

## 2022-03-06 NOTE — Telephone Encounter (Signed)
Pt reports the following BP readings: (advised do not have to check BP this often) 8/25- - 146/70  8/24- - 7:30 am 127/58, 2:30 pm 120/57, 8:30 pm 158/50  8/23- - 7:30 am 132/54, 10:30 am 136/54, 4:30 pm 135/58,   8/22- - 6:30 am 128/57, 7:30 143/62, 2:30 pm 129/58, 4:30 pm 149/58  8/21- - 3:30 pm 173/76  Takes all BP meds around 9am, 2nd dose of coreg around 5-6 pm.   Will send to MD to determine if medication increase is needed.

## 2022-03-06 NOTE — Telephone Encounter (Signed)
-----   Message from Christell Constant, MD sent at 02/28/2022  2:57 PM EDT ----- Results: BMP WNL on aldactone CBC mild normocytic anemia, ordered for CMR Plan: No change in therapy (if elevated BP at home we can try higher aldactone dose) Results of CBC to PCP as FYI   Christell Constant, MD

## 2022-03-10 NOTE — Telephone Encounter (Signed)
Called pt phone continued to ring with no voicemail pick up.  Unable to leave a message.

## 2022-03-12 MED ORDER — SPIRONOLACTONE 25 MG PO TABS: 25.0000 mg | ORAL_TABLET | Freq: Every day | ORAL | 3 refills | Status: DC

## 2022-03-12 NOTE — Telephone Encounter (Signed)
Called pt reviewed MD recommendation to increase Aldactone to 25 mg PO QD, BMP in 1-2 weeks and continue to check BP 1-2 times QD.  Pt was previously checking BP 4-5 times daily.  F/u labs scheduled for 03/27/22. Pt verbalizes understanding.

## 2022-03-24 ENCOUNTER — Ambulatory Visit: Payer: Medicare Other | Attending: Internal Medicine

## 2022-03-24 DIAGNOSIS — I671 Cerebral aneurysm, nonruptured: Secondary | ICD-10-CM | POA: Diagnosis not present

## 2022-03-24 DIAGNOSIS — I10 Essential (primary) hypertension: Secondary | ICD-10-CM

## 2022-03-25 LAB — BASIC METABOLIC PANEL
BUN/Creatinine Ratio: 13 (ref 12–28)
BUN: 12 mg/dL (ref 8–27)
CO2: 23 mmol/L (ref 20–29)
Calcium: 9.6 mg/dL (ref 8.7–10.3)
Chloride: 101 mmol/L (ref 96–106)
Creatinine, Ser: 0.92 mg/dL (ref 0.57–1.00)
Glucose: 77 mg/dL (ref 70–99)
Potassium: 4.8 mmol/L (ref 3.5–5.2)
Sodium: 139 mmol/L (ref 134–144)
eGFR: 62 mL/min/{1.73_m2} (ref >59)

## 2022-03-27 ENCOUNTER — Other Ambulatory Visit: Payer: Medicare Other

## 2022-04-06 DIAGNOSIS — I671 Cerebral aneurysm, nonruptured: Secondary | ICD-10-CM | POA: Diagnosis not present

## 2022-04-20 ENCOUNTER — Other Ambulatory Visit: Payer: Self-pay | Admitting: Cardiology

## 2022-05-01 ENCOUNTER — Ambulatory Visit: Payer: Medicare Other | Admitting: Internal Medicine

## 2022-05-09 ENCOUNTER — Other Ambulatory Visit: Payer: Self-pay | Admitting: Cardiology

## 2022-05-12 ENCOUNTER — Telehealth (HOSPITAL_COMMUNITY): Payer: Self-pay | Admitting: *Deleted

## 2022-05-12 NOTE — Telephone Encounter (Signed)
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 She denies claustrophobia.

## 2022-05-13 ENCOUNTER — Ambulatory Visit (HOSPITAL_COMMUNITY)
Admission: RE | Admit: 2022-05-13 | Discharge: 2022-05-13 | Disposition: A | Discharge status: Home / Self Care | Payer: Medicare Other | Source: Ambulatory Visit | Attending: Internal Medicine | Admitting: Internal Medicine

## 2022-05-13 ENCOUNTER — Other Ambulatory Visit: Payer: Self-pay | Admitting: Internal Medicine

## 2022-05-13 DIAGNOSIS — I421 Obstructive hypertrophic cardiomyopathy: Secondary | ICD-10-CM

## 2022-05-13 MED ORDER — GADOBUTROL 1 MMOL/ML IV SOLN
7.0000 mL | Freq: Once | INTRAVENOUS | Status: AC | PRN
  Administered 2022-05-13: 7 mL via INTRAVENOUS

## 2022-05-18 DIAGNOSIS — G546 Phantom limb syndrome with pain: Secondary | ICD-10-CM | POA: Diagnosis not present

## 2022-05-18 DIAGNOSIS — K219 Gastro-esophageal reflux disease without esophagitis: Secondary | ICD-10-CM | POA: Diagnosis not present

## 2022-05-18 DIAGNOSIS — I2510 Atherosclerotic heart disease of native coronary artery without angina pectoris: Secondary | ICD-10-CM | POA: Diagnosis not present

## 2022-05-18 DIAGNOSIS — I10 Essential (primary) hypertension: Secondary | ICD-10-CM | POA: Diagnosis not present

## 2022-05-18 DIAGNOSIS — Z0000 Encounter for general adult medical examination without abnormal findings: Secondary | ICD-10-CM | POA: Diagnosis not present

## 2022-05-18 DIAGNOSIS — J4520 Mild intermittent asthma, uncomplicated: Secondary | ICD-10-CM | POA: Diagnosis not present

## 2022-05-18 DIAGNOSIS — Z23 Encounter for immunization: Secondary | ICD-10-CM | POA: Diagnosis not present

## 2022-05-18 DIAGNOSIS — I110 Hypertensive heart disease with heart failure: Secondary | ICD-10-CM | POA: Diagnosis not present

## 2022-05-18 DIAGNOSIS — I509 Heart failure, unspecified: Secondary | ICD-10-CM | POA: Diagnosis not present

## 2022-05-18 DIAGNOSIS — E7800 Pure hypercholesterolemia, unspecified: Secondary | ICD-10-CM | POA: Diagnosis not present

## 2022-05-27 ENCOUNTER — Ambulatory Visit (HOSPITAL_COMMUNITY)
Admission: RE | Admit: 2022-05-27 | Discharge: 2022-05-27 | Disposition: A | Discharge status: Home / Self Care | Payer: Medicare Other | Source: Ambulatory Visit | Attending: Cardiovascular Disease | Admitting: Cardiovascular Disease

## 2022-05-27 DIAGNOSIS — R42 Dizziness and giddiness: Secondary | ICD-10-CM

## 2022-05-27 DIAGNOSIS — I771 Stricture of artery: Secondary | ICD-10-CM | POA: Diagnosis not present

## 2022-06-10 ENCOUNTER — Encounter: Payer: Self-pay | Admitting: Internal Medicine

## 2022-06-10 ENCOUNTER — Ambulatory Visit: Payer: Medicare Other | Attending: Internal Medicine | Admitting: Internal Medicine

## 2022-06-10 VITALS — BP 168/68 | HR 61 | Ht 61.0 in | Wt 131.0 lb

## 2022-06-10 DIAGNOSIS — I421 Obstructive hypertrophic cardiomyopathy: Secondary | ICD-10-CM | POA: Diagnosis not present

## 2022-06-10 DIAGNOSIS — I10 Essential (primary) hypertension: Secondary | ICD-10-CM | POA: Diagnosis not present

## 2022-06-10 MED ORDER — SPIRONOLACTONE 50 MG PO TABS: 50.0000 mg | ORAL_TABLET | Freq: Every day | ORAL | 3 refills | Status: DC

## 2022-06-10 NOTE — Patient Instructions (Signed)
Medication Instructions:  INCREASE Spironolactone to 50mg  daily *If you need a refill on your cardiac medications before your next appointment, please call your pharmacy*   Lab Work: BMET in 2 weeks If you have labs (blood work) drawn today and your tests are completely normal, you will receive your results only by: MyChart Message (if you have MyChart) OR A paper copy in the mail If you have any lab test that is abnormal or we need to change your treatment, we will call you to review the results.   Testing/Procedures: Ambulatory referral to PharmD (hypertension clinic) **Please bring BP cuff to visit**   Follow-Up: At [facility], you and your health needs are our priority.  As part of our continuing mission to provide you with exceptional heart care, we have created designated Provider Care Teams.  These Care Teams include your primary Cardiologist (physician) and Advanced Practice Providers (APPs -  Physician Assistants and Nurse Practitioners) who all work together to provide you with the care you need, when you need it.  Your next appointment:   6 month(s)  The format for your next appointment:   In Person  Provider:   [facility], MD    Important Information About Sugar

## 2022-06-10 NOTE — Progress Notes (Signed)
Cardiology Office Note:    Date:  06/10/2022   ID:  [name], DOB 04-25-1941, MRN [id number]  PCP:  Kelton Pillar, MD   [place] [facility] Providers Cardiologist:  Fransico Him, MD     Referring MD: Kelton Pillar, MD   CC: [facility]  History of Present Illness:    [name] is a 81 y.o. 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  CMR septal thickness of 16 mm without LGE.  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   Notes that she first started seeing a doctor regularly at age 59; did not have high blood pressure. 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  Patient feels better than last time. Her SOB and DOE is better, it comes and goes.  Still has DOE and needs to stop and get calm.  How is the fatigue doing.  No difference in her energy.  Palpitations come and go. Chest pains occurs more frequently. Just comes and goes.  No associated with activity.  Blood pressure is 117-140 SBP.  Past Medical History:  Diagnosis Date   CAD in native artery    a. 04/2016 NSTEMI with cath showing 25% MCx, 75% DX1 lesion and moderate LVD 35-45% out of proportion to CAD and in a pattern of Takotsubo CM (no clear stressor)   Carotid stenosis    Carotid Doppler showed 40 to 59% stenosis of the right carotid artery and less than 50% stenosis of the right common carotid artery.  There is 1 to 39% stenosis of the left internal carotid artery and greater than 50% stenosis of the common carotid artery on the left.  There is also left subclavian artery stenosis. by dopplers 05/2021   Dyslipidemia    GERD (gastroesophageal reflux disease)    HOCM (hypertrophic obstructive  cardiomyopathy) (HCC)    Hypercholesteremia    Hypertension    Hypertensive heart disease    initially she was thought to have a HOCM variant by cath with increased LVOT gradient but echo showed only a 49mmHg gradient across the LVOT and SAM and cardiac MRI did not show any late gad enhancement and only a 64mmHg LVOT gradient with 61mm BSH.  There was some mild SAM and mild MR.  EF normal at 76%.     LVH (left ventricular hypertrophy)    a. cMRI 2018 - showing moderate Basal septal hypertrophy not classic for HOCM morphology with no delayed gadolium uptake to suggest myofibrillar disarray, + SAM with small LVOT gradient in regard to physiology and mild MR.   Mild aortic stenosis    NSTEMI (non-ST elevated myocardial infarction) ([place]) 04/2016   secondary to stress CM - Takotsubo CM   PAC (premature atrial contraction)    Event monitors 10/2013 and 08/2014 showed NSR, ST, PACs, PVCs.    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   CARDIAC CATHETERIZATION N/A 04/21/2016   Procedure: Left Heart Cath and Coronary Angiography;  Surgeon: Jettie Booze, MD;  Location: [facility];  Service: Cardiovascular;  Laterality: N/A;   IR GENERIC HISTORICAL  02/13/2016   IR RADIOLOGY PERIPHERAL GUIDED IV START 02/13/2016 Greggory Keen, MD [facility]   IR GENERIC HISTORICAL  02/13/2016   IR US GUIDE VASC ACCESS LEFT 02/13/2016 Greggory Keen, MD [facility]    Current Medications: Current Meds  Medication Sig   acetaminophen (TYLENOL) 325 MG tablet Take 2  tablets (650 mg total) by mouth every 4 (four) hours as needed for headache or mild pain.   alendronate (FOSAMAX) 70 MG tablet once a week.   aspirin EC 81 MG EC tablet Take 1 tablet (81 mg total) by mouth daily.   atorvastatin (LIPITOR) 80 MG tablet Take 1 tablet (80 mg total) by mouth daily.   Calcium Carbonate-Vitamin D (CALCIUM-D PO) Take 1 tablet by mouth daily.   carvedilol (COREG) 12.5 MG tablet TAKE 1 TABLET BY MOUTH 2 TIMES DAILY.   ezetimibe (ZETIA) 10 MG tablet TAKE 1 TABLET BY MOUTH EVERY DAY   fluticasone (FLONASE) 50 MCG/ACT nasal spray Place 1 spray into both nostrils daily as needed (congestion).    fluticasone (FLOVENT HFA) 110 MCG/ACT inhaler Inhale 1 puff into the lungs as needed.    gabapentin (NEURONTIN) 300 MG capsule Take 300 mg by mouth 3 (three) times daily.   hydrochlorothiazide (MICROZIDE) 12.5 MG capsule Take 1 capsule (12.5 mg total) by mouth daily.   losartan (COZAAR) 50 MG tablet Take 1 tablet (50 mg total) by mouth daily.   Multiple Vitamin (MULTIVITAMIN WITH MINERALS) TABS tablet Take 1 tablet by mouth daily.   Omega-3 Fatty Acids (FISH OIL PO) Take 1 capsule by mouth daily at 2 PM.    omeprazole (PRILOSEC OTC) 20 MG tablet    spironolactone (ALDACTONE) 50 MG tablet Take 1 tablet (50 mg total) by mouth daily.   vitamin E 45 MG (100 UNITS) capsule Take by mouth daily.     Allergies:   Other, Ace inhibitors, Codeine, and Penicillins   Social History   Socioeconomic History   Marital status: Single    Spouse name: Not on file   Number of children: Not on file   Years of education: Not on file   Highest education level: Not on file  Occupational History   Not on file  Tobacco Use   Smoking status: Former    Types: Cigarettes    Quit date: 07/13/2004    Years since quitting: 17.9   Smokeless tobacco: Never  Vaping Use   Vaping Use: Never used  Substance and Sexual Activity   Alcohol use: Yes    Comment: ocassionally    Drug use: No   Sexual  activity: Not on file  Other Topics Concern   Not on file  Social History Narrative   Not on file   Social Determinants of Health   Financial Resource Strain: Not on file  Food Insecurity: Not on file  Transportation Needs: Not on file  Physical Activity: Not on file  Stress: Not on file  Social Connections: Not on file     Family History: The patient's family history includes Heart attack in her brother, mother, and sister; Heart  disease in her brother, mother, and sister.  ROS:   Please see the history of present illness.     All other systems reviewed and are negative.  EKGs/Labs/Other Studies Reviewed:    The following studies were reviewed today:  LEFT HEART CATH AND CORONARY ANGIOGRAPHY 04/21/2016  Narrative  Mid Cx lesion, 25 %stenosed.  Ost 1st Diag lesion, 75 %stenosed.  The left ventricular ejection fraction is 35-45% by visual estimate.  There is mild to moderate left ventricular systolic dysfunction in a pattern of Takotsubo cardiomyopathy.  LV end diastolic pressure is mildly elevated.  50-55 mm Hg resting gradient across the LVOT, likely due to subvalvular stenosis. No difficulty crossing the aortic valve.  Severe tortuosity of the right subclavian making torquing catheters difficult. Consider left radial if cath needed in the future.  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  ACE-I in AM if renal function stable.  No clear source of stress that may have caused this episode.   GATED SPECT MYO PERF W/LEXISCAN STRESS 1D 11/24/2019  Narrative  Nuclear stress EF: 63%.  There was no ST segment deviation noted during stress.  The study is normal.  This is a low risk study.  The left ventricular ejection fraction is normal (55-65%).     ECHO COMPLETE WO IMAGING ENHANCING AGENT 01/23/2022  Narrative ECHOCARDIOGRAM REPORT    Patient Name:   MARA FAVERO Date of Exam: 01/23/2022 Medical Rec #:  [id number]       Height:        62.0 in Accession #:    [id number]      Weight:       137.0 lb Date of Birth:  04/19/1941       BSA:          1.628 m Patient Age:    81 years        BP:           148/90 mmHg Patient Gender: F               HR:           72 bpm. Exam Location:  [address]  Procedure: 2D Echo, Cardiac Doppler and Color Doppler  Indications:    I35.0 Nonrheumatic aortic (valve) stenosis  History:        Patient has prior history of Echocardiogram examinations, most recent 11/24/2019. CHF, CAD and Previous Myocardial Infarction, Arrythmias:PVC and PAC; Risk Factors:Dyslipidemia. Palpitations. Hypertensive heart disease. PVD. Takotsubo cardiomyopathy. LVH. Left subclavian artery stensosis. Brain aneurysm.  Sonographer:    Cathie Beams RCS Referring Phys: [phone] TRACI R TURNER  IMPRESSIONS   1. Left ventricular ejection fraction, by estimation, is 60 to 65%. The left ventricle has normal function. The left ventricle has no regional wall motion abnormalities. There is severe asymmetric left ventricular hypertrophy of the basal-septal segment. LVOT gradient due to SAM, measures up to at rest and with Valsalva 2. Right ventricular systolic function is normal. The right ventricular size is normal. 3. The mitral valve is normal in structure. Mild to moderate mitral valve regurgitation. No evidence of mitral stenosis. MR is secondary to [facility] 4. The aortic valve is tricuspid. Aortic valve regurgitation is not visualized. Aortic valve sclerosis is present, with no evidence of aortic valve stenosis.  Conclusion(s)/Recommendation(s): Findings consistent with HOCM. Prior MRI from 2018 is poor quality, would recommend repeating cardiac MRI.  CARDIAC TELEMETRY MONITORING-INTERPRETATION ONLY 03/24/2019  Narrative  Sinus bradycardia, normal sinus rhythm and sinus tachycardia  with average heart rate 61bpm.  Occasional PVCs with PVC load 1%.   CT CORONARY MORPH W/CTA COR W/SCORE W/CA W/CM &/OR  WO/CM 02/13/2016  Addendum 02/13/2016  5:40 PM ADDENDUM REPORT: 02/13/2016 17:38  CLINICAL DATA:  Chest pain  EXAM: Cardiac CTA  MEDICATIONS: Sub lingual nitro. 4mg  and lopressor 10mg   TECHNIQUE: Patient was a difficult iv cannulation. Dr Larena Glassman eventually used micro-puncture technique in IR radiology to start iv  The patient was scanned on a Philips [id number] slice scanner. Gantry rotation speed was 270 msecs. Collimation was .82mm. A 100 kV prospective scan was triggered in the descending thoracic aorta at 111 HU's with 5% padding centered around 78% of the R-R interval. Average HR during the scan was 72 bpm. The 3D data set was interpreted on a dedicated work station using MPR, MIP and VRT modes. A total of 80cc of contrast was used.  FINDINGS: Non-cardiac: See separate report from [facility]. No significant findings on limited lung and soft tissue windows.  Calcium Score:  2 with isolated area of calcification in LM  Coronary Arteries: Right dominant with no anomalies  LM:  Small area of non obstructive calcium  LAD:  Normal tortuous  D1:  Normal large and branching  Circumflex:  Left dominant normal  OM1: Normal  OM2: Normal  RCA:  Small and non dominant normal  PDA:  Left sided normal  PLA:  Left sided normal  IMPRESSION: 1) Poor quality study due to motion artifact and somewhat poor filling of coronary arteries with tortuosity  2) Calcium score 2 isolated foci in LM 23rd percentile for age and sex matched controls  3) Normal left dominant coronary arteries with noted motion artifact and poor distal vessel visualization  Jenkins Rouge   Electronically Signed By: Jenkins Rouge M.D. On: 02/13/2016 17:38  Narrative EXAM: OVER-READ INTERPRETATION  CT CHEST  The following report is an over-read performed by radiologist Dr. Rebekah Chesterfield [facility] on 02/13/2016. This over-read does not include interpretation of cardiac  or coronary anatomy or pathology. The coronary calcium score/coronary CTA interpretation by the cardiologist is attached.  COMPARISON:  None.  FINDINGS: Aortic atherosclerosis. Within the visualized portions of the thorax there are no suspicious appearing pulmonary nodules or masses, there is no acute consolidative airspace disease, no pleural effusions, no pneumothorax and no lymphadenopathy. Visualized portions of the upper abdomen again demonstrate aortic atherosclerosis. High density material in the collecting systems of the kidneys bilaterally, presumably contrast material related to test bolus injection. There are no aggressive appearing lytic or blastic lesions noted in the visualized portions of the skeleton.  IMPRESSION: 1. Aortic atherosclerosis. 2. No acute incidental noncardiac findings noted.  Electronically Signed: By: Vinnie Langton M.D. On: 02/13/2016 16:32   No results found for this or any previous visit from the past 3650 days.   MR CARDIAC MORPHOLOGY W WO CONTRAST 07/27/2016  Narrative CLINICAL DATA:  Hypertrophic Cardiomyopathy  EXAM: CARDIAC MRI  TECHNIQUE: The patient was scanned on a 1.5 Tesla GE magnet. A dedicated cardiac coil was used. Functional imaging was done using Fiesta sequences. 2,3, and 4 chamber views were done to assess for RWMA's. Modified Simpson's rule using a short axis stack was used to calculate an ejection fraction on a dedicated work Conservation officer, nature. The patient received 21 cc of Multihance. After 10 minutes inversion recovery sequences were used to assess for infiltration and scar tissue.  CONTRAST:  21 cc Multihance  FINDINGS: Thee was mild  LAE. The RA/RV were normal in size and function. There was no ASD/VSD or pericardial effusion. The aortic root was normal. The aortic valve was trileaflet with no stenosis. There was moderate basal septal hypertrophy 16 mm compared to the mid septum and posterior  wall at 8 mm. There appear to be SAM. By flow analysis only a mild LVOT gradient of 5 mmHg. There was mild MR  The quantitative EF was 76% (EDV 62 cc SV 47 cc EDV 16 cc) Delayed enhancement images post gadolinium were normal with no evidence of scar or myofibrillar disarray in the septum  IMPRESSION: 1) Moderate Basal septal hypertrophy not classic for HOCM morphology with no delayed gadolium uptake to suggest myofibrillar disarray (16 mm BSH)  2) There was SAM with small LVOT gradient in regard to physiology and mild MR  3) Mild LAE  4) No delayed gadolinium uptake on inversion recovery sequences.  Peter Nishan     VAS US CAROTID DUPLEX BILATERAL 05/16/2021  Summary: Right Carotid: Velocities in the right ICA are consistent with a 40-59% stenosis. Non-hemodynamically significant plaque <50% noted in the CCA. The ECA appears >50% stenosed. Stenosis based on peak systolic velocities and plaque formation.  Left Carotid: Velocities in the left ICA are consistent with a 1-39% stenosis. Hemodynamically significant plaque >50% visualized in the CCA. The ECA appears >50% stenosed.  Vertebrals:  Bilateral vertebral arteries demonstrate antegrade flow. Subclavians: Left subclavian artery was stenotic. Bilateral subclavian artery flow was disturbed.  *See table(s) above for measurements and observations. Suggest follow up study in 12 months.   Recent Labs: 02/26/2022: Hemoglobin 10.8; Platelets 152 03/24/2022: BUN 12; Creatinine, Ser 0.92; Potassium 4.8; Sodium 139  Recent Lipid Panel    Component Value Date/Time   CHOL 154 09/16/2020 1153   TRIG 60 09/16/2020 1153   HDL 55 09/16/2020 1153   CHOLHDL 2.8 09/16/2020 1153   CHOLHDL 3.9 04/22/2016 0017   VLDL 52 (H) 04/22/2016 0017   LDLCALC 87 09/16/2020 1153       Physical Exam:    VS:  BP (!) 168/68   Pulse 61   Ht 5\' 1"  (1.549 m)   Wt 131 lb (59.4 kg)   SpO2 98%   BMI 24.75 kg/m     Wt Readings from Last 3  Encounters:  06/10/22 131 lb (59.4 kg)  02/19/22 137 lb (62.1 kg)  01/09/22 137 lb (62.1 kg)     GEN: Elderly female in no acute distress HEENT: Normal NECK: No JVD; No carotid bruits LYMPHATICS: No lymphadenopathy CARDIAC: RRR, Systolic murmur at rest more prominent with Valsalva, rubs, gallops RESPIRATORY:  Clear to auscultation without rales, wheezing or rhonchi  ABDOMEN: Soft, non-tender, non-distended MUSCULOSKELETAL:  Trace edemal left leg; R AKA SKIN: Warm and dry NEUROLOGIC:  Alert and oriented x 3 PSYCHIATRIC:  Normal affect   ASSESSMENT:    1. HOCM (hypertrophic obstructive cardiomyopathy) ([place])   2. Essential hypertension, benign     PLAN:    Hypertrophic Cardiomyopathy vs Hypertensive Heart disease  - unclear per history whether this is related to hypertension based on her history - NYHA II-III - presents with her sons today; they will get screening echos - I am unclear as to all of her medications but she notes compliance with her list. - will increase her aldactone to 50 mg; continue her Coreg for now, her losartan 50 mg, with hopes of down titrating her HCTZ. - BMP in 1-2 weeks. - I would like her to see Pharm  D Clinic: her goal is to down-titrate her vasodilating medications and her loop diuretic while maintaining BP control; she will bring all of her medications and her BP monitor to this visit - if this fails, I will start her on cardiac myosin inhibitors; - if we cannot get her controled with this therapy, will eval for consideration of ASA.  Three months with me  Time Spent Directly with Patient:   I have spent a total of 40 minutes with the patient reviewing notes, imaging, EKGs, labs and examining the patient as well as establishing an assessment and plan that was discussed personally with the patient.  > 50% of time was spent in direct patient care and/or  family and reviewing imaging with patient .        Medication Adjustments/Labs and Tests  Ordered: Current medicines are reviewed at length with the patient today.  Concerns regarding medicines are outlined above.  Orders Placed This Encounter  Procedures   Basic metabolic panel   AMB Referral to [facility] Pharm-D   EKG 12-Lead   Meds ordered this encounter  Medications   spironolactone (ALDACTONE) 50 MG tablet    Sig: Take 1 tablet (50 mg total) by mouth daily.    Dispense:  90 tablet    Refill:  3    Dose INCREASE    Patient Instructions  Medication Instructions:  INCREASE Spironolactone to 50mg  daily *If you need a refill on your cardiac medications before your next appointment, please call your pharmacy*   Lab Work: BMET in 2 weeks If you have labs (blood work) drawn today and your tests are completely normal, you will receive your results only by: [place] (if you have MyChart) OR A paper copy in the mail If you have any lab test that is abnormal or we need to change your treatment, we will call you to review the results.   Testing/Procedures: Ambulatory referral to PharmD (hypertension clinic) **Please bring BP cuff to visit**   Follow-Up: At [facility], you and your health needs are our priority.  As part of our continuing mission to provide you with exceptional heart care, we have created designated Provider Care Teams.  These Care Teams include your primary Cardiologist (physician) and Advanced Practice Providers (APPs -  Physician Assistants and Nurse Practitioners) who all work together to provide you with the care you need, when you need it.  Your next appointment:   6 month(s)  The format for your next appointment:   In Person  Provider:   Rudean Haskell, MD    Important Information About Sugar         Signed, Werner Lean, MD  06/10/2022 5:47 PM    [place]

## 2022-06-24 ENCOUNTER — Ambulatory Visit: Payer: Medicare Other | Attending: Internal Medicine

## 2022-06-24 DIAGNOSIS — I421 Obstructive hypertrophic cardiomyopathy: Secondary | ICD-10-CM

## 2022-06-24 DIAGNOSIS — I10 Essential (primary) hypertension: Secondary | ICD-10-CM | POA: Diagnosis not present

## 2022-06-25 LAB — BASIC METABOLIC PANEL
BUN/Creatinine Ratio: 10 — ABNORMAL LOW (ref 12–28)
BUN: 11 mg/dL (ref 8–27)
CO2: 23 mmol/L (ref 20–29)
Calcium: 9.7 mg/dL (ref 8.7–10.3)
Chloride: 103 mmol/L (ref 96–106)
Creatinine, Ser: 1.08 mg/dL — ABNORMAL HIGH (ref 0.57–1.00)
Glucose: 88 mg/dL (ref 70–99)
Potassium: 4.6 mmol/L (ref 3.5–5.2)
Sodium: 139 mmol/L (ref 134–144)
eGFR: 51 mL/min/{1.73_m2} — ABNORMAL LOW (ref >59)

## 2022-07-10 ENCOUNTER — Encounter: Attending: Internal Medicine | Primary: Internal Medicine

## 2022-07-21 NOTE — Progress Notes (Unsigned)
Patient ID: [name]                 DOB: October 13, 1940                      MRN: [id number]     HPI: [name] is a 82 y.o. female referred by Dr. Izora Ribas to HTN clinic. PMH is significant for nonobstructive CAD, HLD, Takotsubo cardiomyopathy, PAD s/p R AKA. Last seen by MD 05/2022, ? HOCM vs hypertensive heart disease. Previously wore 24 hr BP cuff in March 2023, overall average BP 134/52. BP was elevated at 168/68 and her spironolactone was increased to 50mg  daily. F/u BMET with slight increase in SCr from 0.92 to 1.08, no med changes made. Pt was referred to PharmD for follow up.   24 hr cuff in March 2023: Overall average BP 134/52mmHg Average Bp awake 134/85mmHg Average Bp asleep 134/52mmHg 50% of SBPs were >129mmHg awake and > 43m asleep 2% of DBPs were > awake and > asleep  Pt presents today in good spirits. Reports toelrating her medications well. Takes all BP meds in the AM (carvedilol dosed 8-9am and 7pm). Denies headache, occasional swelling and dizziness. Ambulates with a cane or walker. Sx resolve on their own. Brings in all home meds today and 3 BP cuffs. Only using 1 bicep cuff which measures similarly to manual clinic reading. Checks BP twice daily at home, in the AM before she takes meds and again in the evening. Detailed list of home BP readings brought in today, see below. Majority at or close to goal.  138/46, HR 71 - home cuff  142/48 - clinic cuff  Current HTN meds: carvedilol 12.5mg  BID, HCTZ 12.5mg  daily, losartan 50mg  daily, spironolactone 50mg  daily (AM)  BP goal: <130/1mmHg  Family History: Heart attack in her brother, mother, and sister; Heart disease in her brother, mother, and sister.   Social History: Former tobacco use, quit in 2006. No drug use, occasional alcohol use.  Diet: salmon, hamburger, snacks on fruit. Avoids canned soup. Drinks Ensure.  Exercise: not much, walks sometimes  Home BP readings:    Wt Readings  from Last 3 Encounters:  06/10/22 131 lb (59.4 kg)  02/19/22 137 lb (62.1 kg)  01/09/22 137 lb (62.1 kg)   BP Readings from Last 3 Encounters:  06/10/22 (!) 168/68  02/19/22 (!) 152/50  01/09/22 (!) 148/90   Pulse Readings from Last 3 Encounters:  06/10/22 61  02/19/22 61  01/09/22 78    Renal function: CrCl cannot be calculated (Patient's most recent lab result is older than the maximum 21 days allowed.).  Past Medical History:  Diagnosis Date   CAD in native artery    a. 04/2016 NSTEMI with cath showing 25% MCx, 75% DX1 lesion and moderate LVD 35-45% out of proportion to CAD and in a pattern of Takotsubo CM (no clear stressor)   Carotid stenosis    Carotid Doppler showed 40 to 59% stenosis of the right carotid artery and less than 50% stenosis of the right common carotid artery.  There is 1 to 39% stenosis of the left internal carotid artery and greater than 50% stenosis of the common carotid artery on the left.  There is also left subclavian artery stenosis. by dopplers 05/2021   Dyslipidemia    GERD (gastroesophageal reflux disease)    HOCM (hypertrophic obstructive cardiomyopathy) (HCC)    Hypercholesteremia    Hypertension    Hypertensive heart disease  initially she was thought to have a HOCM variant by cath with increased LVOT gradient but echo showed only a gradient across the LVOT and SAM and cardiac MRI did not show any late gad enhancement and only a LVOT gradient with 54mm BSH.  There was some mild SAM and mild MR.  EF normal at 76%.     LVH (left ventricular hypertrophy)    a. cMRI 2018 - showing moderate Basal septal hypertrophy not classic for HOCM morphology with no delayed gadolium uptake to suggest myofibrillar disarray, + SAM with small LVOT gradient in regard to physiology and mild MR.   Mild aortic stenosis    NSTEMI (non-ST elevated myocardial infarction) (HCC) 04/2016   secondary to stress CM - Takotsubo CM   PAC (premature atrial  contraction)    Event monitors 10/2013 and 08/2014 showed NSR, ST, PACs, PVCs.    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   EF normalized on repeat echo    Current Outpatient Medications on File Prior to Visit  Medication Sig Dispense Refill   acetaminophen (TYLENOL) 325 MG tablet Take 2 tablets (650 mg total) by mouth every 4 (four) hours as needed for headache or mild pain.     alendronate (FOSAMAX) 70 MG tablet once a week.     aspirin EC 81 MG EC tablet Take 1 tablet (81 mg total) by mouth daily.     atorvastatin (LIPITOR) 80 MG tablet Take 1 tablet (80 mg total) by mouth daily. 90 tablet 3   Calcium Carbonate-Vitamin D (CALCIUM-D PO) Take 1 tablet by mouth daily.     carvedilol (COREG) 12.5 MG tablet TAKE 1 TABLET BY MOUTH 2 TIMES DAILY. 180 tablet 3   ezetimibe (ZETIA) 10 MG tablet TAKE 1 TABLET BY MOUTH EVERY DAY 90 tablet 3   fluticasone (FLONASE) 50 MCG/ACT nasal spray Place 1 spray into both nostrils daily as needed (congestion).      fluticasone (FLOVENT HFA) 110 MCG/ACT inhaler Inhale 1 puff into the lungs as needed.      gabapentin (NEURONTIN) 300 MG capsule Take 300 mg by mouth 3 (three) times daily.     hydrochlorothiazide (MICROZIDE) 12.5 MG capsule Take 1 capsule (12.5 mg total) by mouth daily. 90 capsule 3   losartan (COZAAR) 50 MG tablet Take 1 tablet (50 mg total) by mouth daily. 90 tablet 3   Multiple Vitamin (MULTIVITAMIN WITH MINERALS) TABS tablet Take 1 tablet by mouth daily.     Omega-3 Fatty Acids (FISH OIL PO) Take 1 capsule by mouth daily at 2 PM.      omeprazole (PRILOSEC OTC) 20 MG tablet      spironolactone (ALDACTONE) 50 MG tablet Take 1 tablet (50 mg total) by mouth daily. 90 tablet 3    vitamin E 45 MG (100 UNITS) capsule Take by mouth daily.     No current facility-administered medications on file prior to visit.    Allergies  Allergen Reactions   Other Other (See Comments)   Ace Inhibitors Cough and Other (See Comments)   Codeine Itching, Rash and Other (See Comments)   Penicillins Itching, Rash and Other (See Comments)    Has patient had a PCN reaction  causing immediate rash, facial/tongue/throat swelling, SOB or lightheadedness with hypotension: Yes Has patient had a PCN reaction causing severe rash involving mucus membranes or skin necrosis: No Has patient had a PCN reaction that required hospitalization: Unknown Has patient had a PCN reaction occurring within the last 10 years: No If all of the above answers are "NO", then may proceed with Cephalosporin use.     Assessment/Plan:  1. Hypertension - BP much improved from last visit and closer to goal < 130/44mmHg. Many home readings at goal, home cuff measuring accurately. Advised her to check AM readings a few hours after taking her BP meds rather than right before. Will stop her HCTZ 12.5mg  daily given her HOCM, and increase losartan slightly from 50mg  to 75mg  daily to maintain adequate BP control. Continue carvedilol 12.5mg  BID and spironolactone 50mg  daily. Repeat BMET in 2-3 weeks. Pt will bring in updated home readings at that time to ensure BP remains well controlled.    E. , PharmD, BCACP, [facility]. [address] [place],  [address] Phone: [phone]; Fax: [phone] 07/22/2022 2:27 PM

## 2022-07-22 ENCOUNTER — Encounter: Payer: Self-pay | Admitting: Pharmacist

## 2022-07-22 ENCOUNTER — Ambulatory Visit: Payer: Medicare Other | Attending: Internal Medicine | Admitting: Pharmacist

## 2022-07-22 VITALS — BP 142/48 | HR 70 | Wt 134.0 lb

## 2022-07-22 DIAGNOSIS — I10 Essential (primary) hypertension: Secondary | ICD-10-CM | POA: Diagnosis not present

## 2022-07-22 MED ORDER — LOSARTAN POTASSIUM 50 MG PO TABS: 75.0000 mg | ORAL_TABLET | Freq: Every day | ORAL | 3 refills | Status: DC

## 2022-07-22 NOTE — Patient Instructions (Addendum)
Stop hydrochlorothiazide (HCTZ)  Increase losartan from 50mg  to 75mg  daily. This will be 1 and 1/2 of your 50mg  tablets each day  You can also stop taking your cod liver oil  Continue to check your blood pressure at home  Your goal is < 130/80  Recheck lab work Monday, January 29th any time between 730am and 430pm, you do not need to fast  You can drop off a copy of your home blood pressure readings when you come back for lab - let them know it needs to go to the pharmacist 

## 2022-07-31 ENCOUNTER — Telehealth: Payer: Self-pay | Admitting: Internal Medicine

## 2022-07-31 MED ORDER — LOSARTAN POTASSIUM 100 MG PO TABS: 100.0000 mg | ORAL_TABLET | Freq: Every day | ORAL | 3 refills | Status: DC

## 2022-07-31 NOTE — Telephone Encounter (Signed)
Med changes made at last visit with me on 1/10 were: stopping HCTZ 12.5mg  daily and increasing her losartan from 50mg  to 75mg  daily.   Called pt. She is concerned with elevated BP readings the past few days. Was 174/69 this AM, then laid down for a few minutes and on recheck it was 164/69. Not really concerned with dizziness, reports she hasn't experienced that at all except felt a little "woozy" this AM but was also coughing more and didn't sleep as well last night.  Will increase her losartan to 100mg  daily. She will continue to monitor her BP and keep BMET check on 1/29. Pt appreciative for the call.

## 2022-07-31 NOTE — Telephone Encounter (Signed)
Pt c/o BP issue: STAT if pt c/o blurred vision, one-sided weakness or slurred speech  1. What are your last 5 BP readings?   1/15: (morning) 119/51, (evening) 156/58 1/16: (morning) 139/50, (evening)  110/48 1/17: (morning) 136/51, (evening) 146/41 1/18: (morning) 152/52, (evening) 152/58 1/19: (morning) 157/50,   All taken after medication   2. Are you having any other symptoms (ex. Dizziness, headache, blurred vision, passed out)? Dizziness   3. What is your BP issue? Pt states they changed her bp medication however she is still concerned that her bp is staying around the 50's. She also states she had a little dizziness. Please advise.

## 2022-08-04 ENCOUNTER — Observation Stay (HOSPITAL_COMMUNITY)
Admission: EM | Admit: 2022-08-04 | Discharge: 2022-08-05 | Disposition: A | Discharge status: Home / Self Care | Payer: 59 | Attending: Family Medicine | Admitting: Family Medicine

## 2022-08-04 ENCOUNTER — Emergency Department (HOSPITAL_COMMUNITY): Payer: 59

## 2022-08-04 ENCOUNTER — Encounter (HOSPITAL_COMMUNITY): Payer: Self-pay

## 2022-08-04 ENCOUNTER — Other Ambulatory Visit: Payer: Self-pay

## 2022-08-04 DIAGNOSIS — R079 Chest pain, unspecified: Secondary | ICD-10-CM | POA: Diagnosis not present

## 2022-08-04 DIAGNOSIS — Z8679 Personal history of other diseases of the circulatory system: Secondary | ICD-10-CM | POA: Insufficient documentation

## 2022-08-04 DIAGNOSIS — R0789 Other chest pain: Principal | ICD-10-CM | POA: Insufficient documentation

## 2022-08-04 DIAGNOSIS — Z87891 Personal history of nicotine dependence: Secondary | ICD-10-CM | POA: Diagnosis not present

## 2022-08-04 DIAGNOSIS — I214 Non-ST elevation (NSTEMI) myocardial infarction: Secondary | ICD-10-CM

## 2022-08-04 DIAGNOSIS — I2510 Atherosclerotic heart disease of native coronary artery without angina pectoris: Secondary | ICD-10-CM | POA: Diagnosis present

## 2022-08-04 DIAGNOSIS — Z79899 Other long term (current) drug therapy: Secondary | ICD-10-CM | POA: Diagnosis not present

## 2022-08-04 DIAGNOSIS — I421 Obstructive hypertrophic cardiomyopathy: Secondary | ICD-10-CM | POA: Diagnosis present

## 2022-08-04 DIAGNOSIS — Z7982 Long term (current) use of aspirin: Secondary | ICD-10-CM | POA: Insufficient documentation

## 2022-08-04 DIAGNOSIS — I10 Essential (primary) hypertension: Secondary | ICD-10-CM | POA: Diagnosis present

## 2022-08-04 DIAGNOSIS — I739 Peripheral vascular disease, unspecified: Secondary | ICD-10-CM | POA: Diagnosis present

## 2022-08-04 DIAGNOSIS — E785 Hyperlipidemia, unspecified: Secondary | ICD-10-CM | POA: Diagnosis present

## 2022-08-04 LAB — CBC
HCT: 38.7 % (ref 36.0–46.0)
Hemoglobin: 12.7 g/dL (ref 12.0–15.0)
MCH: 31.1 pg (ref 26.0–34.0)
MCHC: 32.8 g/dL (ref 30.0–36.0)
MCV: 94.9 fL (ref 80.0–100.0)
Platelets: 136 10*3/uL — ABNORMAL LOW (ref 150–400)
RBC: 4.08 MIL/uL (ref 3.87–5.11)
RDW: 15.6 % — ABNORMAL HIGH (ref 11.5–15.5)
WBC: 8.1 10*3/uL (ref 4.0–10.5)
nRBC: 0 % (ref 0.0–0.2)

## 2022-08-04 LAB — BASIC METABOLIC PANEL
Anion gap: 13 (ref 5–15)
BUN: 21 mg/dL (ref 8–23)
CO2: 23 mmol/L (ref 22–32)
Calcium: 10.1 mg/dL (ref 8.9–10.3)
Chloride: 98 mmol/L (ref 98–111)
Creatinine, Ser: 1.15 mg/dL — ABNORMAL HIGH (ref 0.44–1.00)
GFR, Estimated: 47 mL/min — ABNORMAL LOW (ref >60)
Glucose, Bld: 96 mg/dL (ref 70–99)
Potassium: 4.8 mmol/L (ref 3.5–5.1)
Sodium: 134 mmol/L — ABNORMAL LOW (ref 135–145)

## 2022-08-04 LAB — TROPONIN I (HIGH SENSITIVITY)
Troponin I (High Sensitivity): 41 ng/L — ABNORMAL HIGH (ref <18)
Troponin I (High Sensitivity): 62 ng/L — ABNORMAL HIGH (ref <18)

## 2022-08-04 MED ORDER — ACETAMINOPHEN 650 MG RE SUPP: 650.0000 mg | Freq: Four times a day (QID) | RECTAL | Status: DC | PRN

## 2022-08-04 MED ORDER — NALOXONE HCL 0.4 MG/ML IJ SOLN: 0.4000 mg | INTRAMUSCULAR | Status: DC | PRN

## 2022-08-04 MED ORDER — ACETAMINOPHEN 325 MG PO TABS: 650.0000 mg | ORAL_TABLET | Freq: Four times a day (QID) | ORAL | Status: DC | PRN

## 2022-08-04 MED ORDER — FENTANYL CITRATE PF 50 MCG/ML IJ SOSY: 12.5000 ug | PREFILLED_SYRINGE | Freq: Once | INTRAMUSCULAR | Status: DC | PRN

## 2022-08-04 MED ORDER — NITROGLYCERIN 0.4 MG SL SUBL: 0.4000 mg | SUBLINGUAL_TABLET | Freq: Once | SUBLINGUAL | Status: DC

## 2022-08-04 MED ORDER — ENOXAPARIN SODIUM 40 MG/0.4ML IJ SOSY
40.0000 mg | PREFILLED_SYRINGE | INTRAMUSCULAR | Status: DC
  Administered 2022-08-05: 40 mg via SUBCUTANEOUS
  Filled 2022-08-04: qty 0.4

## 2022-08-04 MED ORDER — ASPIRIN 81 MG PO CHEW
324.0000 mg | CHEWABLE_TABLET | Freq: Once | ORAL | Status: AC
  Administered 2022-08-05: 324 mg via ORAL
  Filled 2022-08-04: qty 4

## 2022-08-04 NOTE — H&P (Signed)
History and Physical    [name] VWU:[id number] DOB: 03/10/1941 DOA: 08/04/2022  PCP: Pa, [place]  Patient coming from: Home  Chief Complaint: Chest pain  HPI: [name] is a 82 y.o. female with medical history significant of nonobstructive CAD, prior Takotsubo cardiomyopathy, HOCM, hypertension, hyperlipidemia, PVD, GERD, brain posterior communicating artery aneurysm presented to ED with a chief complaint of chest pain.  Vital signs stable on arrival.  EKG without acute ischemic changes.  Troponin 41> 62.  Chest x-ray showing low lung volumes with mild bibasilar atelectasis.  Patient is a very poor historian.  She is reporting ongoing chest pain.  Does not remember when her symptoms started but thinks it was sometime yesterday.  She started having sudden onset substernal chest pain/tightness which radiated to her left arm.  Associated with nausea and shortness of breath.  She thinks her symptoms started at rest and were not exertional.  She reports history of prior heart attack several years ago.  Denies history of blood clots.  Patient initially stated that her chest pain had subsided but later stated she continues to have 8 out of 10 intensity pain.  Review of Systems:  Review of Systems  All other systems reviewed and are negative.   Past Medical History:  Diagnosis Date   CAD in native artery    a. 04/2016 NSTEMI with cath showing 25% MCx, 75% DX1 lesion and moderate LVD 35-45% out of proportion to CAD and in a pattern of Takotsubo CM (no clear stressor)   Carotid stenosis    Carotid Doppler showed 40 to 59% stenosis of the right carotid artery and less than 50% stenosis of the right common carotid artery.  There is 1 to 39% stenosis of the left internal carotid artery and greater than 50% stenosis of the common carotid artery on the left.  There is also left subclavian artery stenosis. by dopplers 05/2021   Dyslipidemia    GERD (gastroesophageal  reflux disease)    HOCM (hypertrophic obstructive cardiomyopathy) (HCC)    Hypercholesteremia    Hypertension    Hypertensive heart disease    initially she was thought to have a HOCM variant by cath with increased LVOT gradient but echo showed only a 64mmHg gradient across the LVOT and SAM and cardiac MRI did not show any late gad enhancement and only a 79mmHg LVOT gradient with 66mm BSH.  There was some mild SAM and mild MR.  EF normal at 76%.     LVH (left ventricular hypertrophy)    a. cMRI 2018 - showing moderate Basal septal hypertrophy not classic for HOCM morphology with no delayed gadolium uptake to suggest myofibrillar disarray, + SAM with small LVOT gradient in regard to physiology and mild MR.   Mild aortic stenosis    NSTEMI (non-ST elevated myocardial infarction) ([place]) 04/2016   secondary to stress CM - Takotsubo CM   PAC (premature atrial contraction)    Event monitors 10/2013 and 08/2014 showed NSR, ST, PACs, PVCs.    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   EF normalized on repeat echo    Past Surgical History:  Procedure Laterality Date   ABDOMINAL HYSTERECTOMY  BELOW KNEE LEG AMPUTATION     converted to AKA 5/08   CARDIAC CATHETERIZATION N/A 04/21/2016   Procedure: Left Heart Cath and Coronary Angiography;  Surgeon: Jettie Booze, MD;  Location: [facility];  Service: Cardiovascular;  Laterality: N/A;   IR GENERIC HISTORICAL  02/13/2016   IR RADIOLOGY PERIPHERAL GUIDED IV START 02/13/2016 Greggory Keen, MD [facility]   IR GENERIC HISTORICAL  02/13/2016   IR US GUIDE VASC ACCESS LEFT 02/13/2016 Greggory Keen, MD [facility]     reports that she quit smoking about 18 years ago.  Her smoking use included cigarettes. She has never used smokeless tobacco. She reports current alcohol use. She reports that she does not use drugs.  Allergies  Allergen Reactions   Other Other (See Comments)   Ace Inhibitors Cough and Other (See Comments)   Codeine Itching, Rash and Other (See Comments)   Penicillins Itching, Rash and Other (See Comments)    Has patient had a PCN reaction causing immediate rash, facial/tongue/throat swelling, SOB or lightheadedness with hypotension: Yes Has patient had a PCN reaction causing severe rash involving mucus membranes or skin necrosis: No Has patient had a PCN reaction that required hospitalization: Unknown Has patient had a PCN reaction occurring within the last 10 years: No If all of the above answers are "NO", then may proceed with Cephalosporin use.    Family History  Problem Relation Age of Onset   Heart attack Mother    Heart disease Mother    Heart attack Brother    Heart disease Brother    Heart attack Sister    Heart disease Sister     Prior to Admission medications   Medication Sig Start Date End Date Taking? Authorizing Provider  acetaminophen (TYLENOL) 325 MG tablet Take 2 tablets (650 mg total) by mouth every 4 (four) hours as needed for headache or mild pain. 04/23/16   Erlene Quan, PA-C  alendronate (FOSAMAX) 70 MG tablet Take 70 mg by mouth once a week.    [provider]  aspirin EC 81 MG EC tablet Take 1 tablet (81 mg total) by mouth daily. 04/23/16   Erlene Quan, PA-C  atorvastatin (LIPITOR) 80 MG tablet Take 1 tablet (80 mg total) by mouth daily. 05/08/21   Sueanne Margarita, MD  Calcium Carbonate-Vitamin D (CALCIUM-D PO) Take 1 tablet by mouth daily.    [provider]  carvedilol (COREG) 12.5 MG tablet TAKE 1 TABLET BY MOUTH 2 TIMES DAILY. 04/20/22   Sueanne Margarita, MD  Cholecalciferol (VITAMIN D) 125 MCG (5000 UT) CAPS Take 5,000 Units by mouth daily.    [provider]  ezetimibe  (ZETIA) 10 MG tablet TAKE 1 TABLET BY MOUTH EVERY DAY 04/20/22   Sueanne Margarita, MD  ferrous sulfate 325 (65 FE) MG EC tablet Take 325 mg by mouth 3 (three) times a week.    [provider]  fluticasone (FLONASE) 50 MCG/ACT nasal spray Place 1 spray into both nostrils daily as needed (congestion).  05/10/17   [provider]  fluticasone (FLOVENT HFA) 110 MCG/ACT inhaler Inhale 1 puff into the lungs as needed.  05/10/17   [provider]  gabapentin (NEURONTIN) 300 MG capsule Take 600 mg by mouth 3 (three) times daily.    [provider]  losartan (COZAAR) 100 MG tablet Take 1 tablet (100 mg total) by mouth daily. 07/31/22   Werner Lean, MD  Multiple Vitamin (MULTIVITAMIN WITH MINERALS) TABS  tablet Take 1 tablet by mouth daily.    [provider]  Omega-3 Fatty Acids (FISH OIL PO) Take 1 capsule by mouth daily at 2 PM.     [provider]  omeprazole (PRILOSEC OTC) 20 MG tablet     [provider]  spironolactone (ALDACTONE) 50 MG tablet Take 1 tablet (50 mg total) by mouth daily. 06/10/22   Werner Lean, MD    Physical Exam: Vitals:   08/04/22 1900 08/04/22 1930 08/04/22 2000 08/04/22 2121  BP: (!) 140/62 118/78 (!) 135/97 135/68  Pulse: 80 82 86 83  Resp: (!) 24 (!) 28 20 (!) 23  Temp:      TempSrc:      SpO2: 96% 97% 97% 94%  Weight:      Height:        Physical Exam Vitals reviewed.  Constitutional:      General: She is not in acute distress. HENT:     Head: Normocephalic and atraumatic.  Eyes:     Extraocular Movements: Extraocular movements intact.  Cardiovascular:     Rate and Rhythm: Normal rate and regular rhythm.     Pulses: Normal pulses.  Pulmonary:     Effort: Pulmonary effort is normal. No respiratory distress.     Breath sounds: No wheezing.  Abdominal:     General: Bowel sounds are normal. There is no distension.     Palpations: Abdomen is soft.     Tenderness: There is no  abdominal tenderness.  Musculoskeletal:     Cervical back: Normal range of motion.     Right lower leg: No edema.     Left lower leg: No edema.  Skin:    General: Skin is warm and dry.  Neurological:     General: No focal deficit present.     Mental Status: She is oriented to person, place, and time.     Labs on Admission: I have personally reviewed following labs and imaging studies  CBC: Recent Labs  Lab 08/04/22 1228  WBC 8.1  HGB 12.7  HCT 38.7  MCV 94.9  PLT 564*   Basic Metabolic Panel: Recent Labs  Lab 08/04/22 1722  NA 134*  K 4.8  CL 98  CO2 23  GLUCOSE 96  BUN 21  CREATININE 1.15*  CALCIUM 10.1   GFR: Estimated Creatinine Clearance: 31.9 mL/min (A) (by C-G formula based on SCr of 1.15 mg/dL (H)). Liver Function Tests: No results for input(s): "AST", "ALT", "ALKPHOS", "BILITOT", "PROT", "ALBUMIN" in the last 168 hours. No results for input(s): "LIPASE", "AMYLASE" in the last 168 hours. No results for input(s): "AMMONIA" in the last 168 hours. Coagulation Profile: No results for input(s): "INR", "PROTIME" in the last 168 hours. Cardiac Enzymes: No results for input(s): "CKTOTAL", "CKMB", "CKMBINDEX", "TROPONINI" in the last 168 hours. BNP (last 3 results) No results for input(s): "PROBNP" in the last 8760 hours. HbA1C: No results for input(s): "HGBA1C" in the last 72 hours. CBG: No results for input(s): "GLUCAP" in the last 168 hours. Lipid Profile: No results for input(s): "CHOL", "HDL", "LDLCALC", "TRIG", "CHOLHDL", "LDLDIRECT" in the last 72 hours. Thyroid Function Tests: No results for input(s): "TSH", "T4TOTAL", "FREET4", "T3FREE", "THYROIDAB" in the last 72 hours. Anemia Panel: No results for input(s): "VITAMINB12", "FOLATE", "FERRITIN", "TIBC", "IRON", "RETICCTPCT" in the last 72 hours. Urine analysis: No results found for: "COLORURINE", "APPEARANCEUR", "LABSPEC", "PHURINE", "GLUCOSEU", "HGBUR", "BILIRUBINUR", "KETONESUR", "PROTEINUR",  "UROBILINOGEN", "NITRITE", "LEUKOCYTESUR"  Radiological Exams on Admission: DG Chest 2 View  Result Date: 08/04/2022 CLINICAL DATA:  Chest pain. EXAM: CHEST - 2 VIEW COMPARISON:  Chest x-ray dated August 30, 2017. FINDINGS: The heart size and mediastinal contours are within normal limits. Normal pulmonary vascularity. Low lung volumes with mild bibasilar atelectasis. No focal consolidation, pleural effusion, or pneumothorax. No acute osseous abnormality. IMPRESSION: 1. Low lung volumes with mild bibasilar atelectasis. Electronically Signed   By: Obie Dredge M.D.   On: 08/04/2022 13:39    EKG: Independently reviewed.  Sinus rhythm, no significant change since prior tracing.  Assessment and Plan  Chest pain History of Takotsubo cardiomyopathy and nonobstructive CAD per [facility] 2017 History of HOCM EKG without acute ischemic changes.  Troponin 41> 62.  Chest x-ray showing low lung volumes with mild bibasilar atelectasis.  PE less likely given no tachycardia or hypoxia.  Patient is a very poor historian, unknown whether she has dementia.  Chest pain appears to be nonexertional.  She initially stated that her chest pain had subsided but later stated she continues to have 8 out of 10 intensity pain.  Appears comfortable on exam.  Will give a dose of aspirin.  Discussed with Dr. Hyacinth Meeker with cardiology, he recommends checking a third set of troponin.  If stable, hold off on starting IV heparin.  Recommending avoiding nitrates given HOCM and obtaining echocardiogram in the morning.  May need repeat cath versus coronary CT.  He will see the patient and give further recommendations.  Appreciate assistance from cardiology.  Hypertension Hyperlipidemia PVD GERD -Pharmacy med rec pending  DVT prophylaxis: Lovenox Code Status: Full Code (discussed with the patient) Family Communication: Son at bedside. Consults called: Cardiology Level of care: Telemetry bed Admission status: It is my clinical  opinion that referral for OBSERVATION is reasonable and necessary in this patient based on the above information provided. The aforementioned taken together are felt to place the patient at high risk for further clinical deterioration. However, it is anticipated that the patient may be medically stable for discharge from the hospital within 24 to 48 hours.   John Giovanni MD [facility]  If 7PM-7AM, please contact night-coverage [email or web address]  08/04/2022, 10:00 PM

## 2022-08-04 NOTE — ED Provider Triage Note (Signed)
Emergency Medicine Provider Triage Evaluation Note  [name] , a 82 y.o. female  was evaluated in triage.  Pt complains of substernal chest pain with radiation into the left shoulder and down the left arm.  Symptoms began yesterday.  Describes it as a tightness.  States it gets worse with movement and improves with rest.  Has a reported significant cardiac history.  States she currently has no symptoms.  Denied having cough or shortness of breath  Review of Systems  Positive: As above Negative: As above  Physical Exam  BP 138/81 (BP Location: Right Arm)   Pulse 83   Temp 98.3 F (36.8 C) (Oral)   Resp 15   Ht 5\' 2"  (1.575 m)   Wt 61.2 kg   SpO2 94%   BMI 24.69 kg/m  Gen:   Awake, no distress   Resp:  Normal effort  MSK:   Moves extremities without difficulty  Other:  Blowing systolic murmur  Medical Decision Making  Medically screening exam initiated at 11:52 AM.  Appropriate orders placed.  Ples Specter was informed that the remainder of the evaluation will be completed by another provider, this initial triage assessment does not replace that evaluation, and the importance of remaining in the ED until their evaluation is complete.  Chest pain labs   Nehemiah Massed 08/04/22 1153

## 2022-08-04 NOTE — ED Provider Notes (Signed)
Pt's care asumed at 4pm.  Pt awaiting second troponin.  Pt had chest pain that started yesterday at 3pm.  Pt reports continued pain today.  Pt has a history of cardiomyopathy and CAD.  Pt's first troponin of 42 and second of 71.    I spoke to Cardiology Dr. Marcelle Smiling fellow on call.  He advised Medicine admission.  I spoke with Hospitalist who will admit.    Fransico Meadow, Hershal Coria 08/04/22 2209    Pattricia Boss, MD 08/05/22 [phone]

## 2022-08-04 NOTE — ED Provider Notes (Signed)
[facility] Provider Note   CSN: [id number] Arrival date & time: 08/04/22  1129     History  Chief Complaint  Patient presents with   Chest Pain    [name] is a 82 y.o. female with medical history of NSTEMI in 2017, Takotsubo cardiomyopathy, PAD, CAD, carotid stenosis, GERD, hokum, hypertension, hypertensive heart disease, left ventricular hypertrophy, PVCs, mild aortic stenosis.  Patient presents to ED for evaluation of chest pain.  Patient reports that beginning yesterday around 3 PM she was in her usual state of health when she developed centralized chest pain that radiated into her left arm.  Patient reports that at the worst the chest pain was 9 out of 10.  Patient states that the pain has progressed into today.  Patient is unsure if this pain is worse with exertion.  The patient does state that she is short of breath and slightly nauseous.  Patient also endorsing slight lightheadedness.  Patient denies any weakness, syncope, vomiting, fevers, back pain or abdominal pain.  Patient states she has remote history of NSTEMI in 2017 and is currently being followed by cardiology.  Patient denies any blood thinners.   Chest Pain      Home Medications Prior to Admission medications   Medication Sig Start Date End Date Taking? Authorizing Provider  acetaminophen (TYLENOL) 325 MG tablet Take 2 tablets (650 mg total) by mouth every 4 (four) hours as needed for headache or mild pain. 04/23/16   Erlene Quan, PA-C  alendronate (FOSAMAX) 70 MG tablet Take 70 mg by mouth once a week.    [provider]  aspirin EC 81 MG EC tablet Take 1 tablet (81 mg total) by mouth daily. 04/23/16   Erlene Quan, PA-C  atorvastatin (LIPITOR) 80 MG tablet Take 1 tablet (80 mg total) by mouth daily. 05/08/21   Sueanne Margarita, MD  Calcium Carbonate-Vitamin D (CALCIUM-D PO) Take 1 tablet by mouth daily.    [provider]  carvedilol  (COREG) 12.5 MG tablet TAKE 1 TABLET BY MOUTH 2 TIMES DAILY. 04/20/22   Sueanne Margarita, MD  Cholecalciferol (VITAMIN D) 125 MCG (5000 UT) CAPS Take 5,000 Units by mouth daily.    [provider]  ezetimibe (ZETIA) 10 MG tablet TAKE 1 TABLET BY MOUTH EVERY DAY 04/20/22   Sueanne Margarita, MD  ferrous sulfate 325 (65 FE) MG EC tablet Take 325 mg by mouth 3 (three) times a week.    [provider]  fluticasone (FLONASE) 50 MCG/ACT nasal spray Place 1 spray into both nostrils daily as needed (congestion).  05/10/17   [provider]  fluticasone (FLOVENT HFA) 110 MCG/ACT inhaler Inhale 1 puff into the lungs as needed.  05/10/17   [provider]  gabapentin (NEURONTIN) 300 MG capsule Take 600 mg by mouth 3 (three) times daily.    [provider]  losartan (COZAAR) 100 MG tablet Take 1 tablet (100 mg total) by mouth daily. 07/31/22   Werner Lean, MD  Multiple Vitamin (MULTIVITAMIN WITH MINERALS) TABS tablet Take 1 tablet by mouth daily.    [provider]  Omega-3 Fatty Acids (FISH OIL PO) Take 1 capsule by mouth daily at 2 PM.     [provider]  omeprazole (PRILOSEC OTC) 20 MG tablet     [provider]  spironolactone (ALDACTONE) 50 MG tablet Take 1 tablet (50 mg total) by mouth daily. 06/10/22   Chandrasekhar,  Mahesh A, MD      Allergies    Other, Ace inhibitors, Codeine, and Penicillins    Review of Systems   Review of Systems  Cardiovascular:  Positive for chest pain.    Physical Exam Updated Vital Signs BP 121/65   Pulse 83   Temp 99 F (37.2 C) (Oral)   Resp (!) 22   Ht 5\' 2"  (1.575 m)   Wt 61.2 kg   SpO2 97%   BMI 24.69 kg/m  Physical Exam Vitals and nursing note reviewed.  Constitutional:      General: She is not in acute distress.    Appearance: She is not ill-appearing, toxic-appearing or diaphoretic.  HENT:     Head: Normocephalic and atraumatic.     Nose: Nose normal.     Mouth/Throat:      Mouth: Mucous membranes are moist.     Pharynx: Oropharynx is clear.  Eyes:     Extraocular Movements: Extraocular movements intact.     Conjunctiva/sclera: Conjunctivae normal.     Pupils: Pupils are equal, round, and reactive to light.  Cardiovascular:     Rate and Rhythm: Normal rate and regular rhythm.  Pulmonary:     Effort: Pulmonary effort is normal.     Breath sounds: Normal breath sounds. No wheezing.  Abdominal:     General: Abdomen is flat. Bowel sounds are normal.     Palpations: Abdomen is soft.     Tenderness: There is no abdominal tenderness.  Musculoskeletal:     Cervical back: Normal range of motion and neck supple. No tenderness.     Right Lower Extremity: Right leg is amputated below knee.  Neurological:     Mental Status: She is alert.     ED Results / Procedures / Treatments   Labs (all labs ordered are listed, but only abnormal results are displayed) Labs Reviewed  CBC - Abnormal; Notable for the following components:      Result Value   RDW 15.6 (*)    Platelets 136 (*)    All other components within normal limits  TROPONIN I (HIGH SENSITIVITY) - Abnormal; Notable for the following components:   Troponin I (High Sensitivity) 41 (*)    All other components within normal limits  BASIC METABOLIC PANEL  TROPONIN I (HIGH SENSITIVITY)    EKG EKG Interpretation  Date/Time:  Tuesday August 04 2022 11:38:48 EST Ventricular Rate:  87 PR Interval:  142 QRS Duration: 80 QT Interval:  332 QTC Calculation: 399 R Axis:   42 Text Interpretation: Normal sinus rhythm Confirmed by 06-08-2003 [phone]) on 08/04/2022 2:14:58 PM  Radiology DG Chest 2 View  Result Date: 08/04/2022 CLINICAL DATA:  Chest pain. EXAM: CHEST - 2 VIEW COMPARISON:  Chest x-ray dated August 30, 2017. FINDINGS: The heart size and mediastinal contours are within normal limits. Normal pulmonary vascularity. Low lung volumes with mild bibasilar atelectasis. No focal consolidation,  pleural effusion, or pneumothorax. No acute osseous abnormality. IMPRESSION: 1. Low lung volumes with mild bibasilar atelectasis. Electronically Signed   By: September 01, 2017 M.D.   On: 08/04/2022 13:39    Procedures Procedures    Medications Ordered in ED Medications - No data to display  ED Course/ Medical Decision Making/ A&P                          Medical Decision Making  82 year old female presents to ED for evaluation.  Please see HPI for further  details.  Patient afebrile nontachycardic.  Patient lung sounds clear bilaterally, she is not hypoxic on room air.  Patient abdomen soft and compressible throughout.  Negative Levine sign.  Patient neurological examination with no focal deficit.  Patient workup will include CBC, BMP, troponin x 2, chest x-ray, EKG.  Patient CBC unremarkable, no leukocytosis or anemia.  Patient BMP pending.  Patient initial troponin 41, delta pending.  Patient chest x-ray unremarkable without consolidations or effusions.  Patient EKG nonischemic.  At this time, patient workup not complete.  Patient signed out to oncoming provider pending delta troponin.  Patient stable at time of shift handoff.  Final Clinical Impression(s) / ED Diagnoses Final diagnoses:  Chest pain, unspecified type    Rx / DC Orders ED Discharge Orders     None         Azucena Cecil, PA-C 08/04/22 1543    Audley Hose, MD 08/04/22 2208

## 2022-08-04 NOTE — Consult Note (Signed)
Cardiology Consultation:   Patient ID: [name] MRN: [id number]; DOB: Apr 06, 1941  Admit date: 08/04/2022 Date of Consult: 08/04/2022  Primary Care Provider: [facility] Primary Cardiologist: Armanda Magic, MD  Primary Electrophysiologist:  None    Patient Profile:   [name] is a 82 y.o. female with a hx of nonobstructive coronary disease, prior Takotsubo cardiomyopathy, hokum, HTN, HLD, PVD, GERD, posterior communicating artery aneurysm who is being seen today for the evaluation of chest pain at the request of hospital medicine.  History of Present Illness:   [name] is an 82 year old female with above medical problems.  Notably presenting to the emergency department with chest pain.  Reports that the chest pain is persistent but unable to tell when it started.  Thinks it was sometime yesterday.  Noted that she started having sudden onset substernal chest pain and tightness radiating to the arm.  She did have some nausea and dyspnea.  She denies any other symptoms including upper respiratory infection like symptoms, palpitations, abdominal pain.  On arrival to the emergency department initial workup was unremarkable.  ECG without notable changes.  Troponin initially collected was 41 and subsequent was 62.  Given the elevated troponin and ongoing persistent chest pain, cardiology consulted to weigh in evaluation.  Past Medical History:  Diagnosis Date   CAD in native artery    a. 04/2016 NSTEMI with cath showing 25% MCx, 75% DX1 lesion and moderate LVD 35-45% out of proportion to CAD and in a pattern of Takotsubo CM (no clear stressor)   Carotid stenosis    Carotid Doppler showed 40 to 59% stenosis of the right carotid artery and less than 50% stenosis of the right common carotid artery.  There is 1 to 39% stenosis of the left internal carotid artery and greater than 50% stenosis of the common carotid artery on the left.  There is also left subclavian  artery stenosis. by dopplers 05/2021   Dyslipidemia    GERD (gastroesophageal reflux disease)    HOCM (hypertrophic obstructive cardiomyopathy) (HCC)    Hypercholesteremia    Hypertension    Hypertensive heart disease    initially she was thought to have a HOCM variant by cath with increased LVOT gradient but echo showed only a gradient across the LVOT and SAM and cardiac MRI did not show any late gad enhancement and only a LVOT gradient with 29mm BSH.  There was some mild SAM and mild MR.  EF normal at 76%.     LVH (left ventricular hypertrophy)    a. cMRI 2018 - showing moderate Basal septal hypertrophy not classic for HOCM morphology with no delayed gadolium uptake to suggest myofibrillar disarray, + SAM with small LVOT gradient in regard to physiology and mild MR.   Mild aortic stenosis    NSTEMI (non-ST elevated myocardial infarction) (HCC) 04/2016   secondary to stress CM - Takotsubo CM   PAC (premature atrial contraction)    Event monitors 10/2013 and 08/2014 showed NSR, ST, PACs, PVCs.    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 Authorizing Provider  acetaminophen (TYLENOL) 325 MG tablet Take 2 tablets (650 mg total) by mouth every 4 (four) hours as needed for headache or mild pain. 04/23/16   Erlene Quan, PA-C  alendronate (FOSAMAX) 70 MG tablet Take 70 mg by mouth once a week.    [provider]  aspirin EC 81 MG EC tablet Take 1 tablet (81 mg total) by mouth daily. 04/23/16   Erlene Quan, PA-C  atorvastatin (LIPITOR) 80 MG tablet Take 1 tablet (80 mg total) by mouth daily. 05/08/21   Sueanne Margarita, MD  Calcium Carbonate-Vitamin D (CALCIUM-D PO) Take 1 tablet by mouth daily.    [provider]  carvedilol (COREG) 12.5 MG tablet TAKE 1 TABLET BY MOUTH 2 TIMES DAILY. 04/20/22   Sueanne Margarita, MD  Cholecalciferol (VITAMIN D) 125 MCG (5000 UT) CAPS Take 5,000 Units by mouth daily.    [provider]  ezetimibe (ZETIA) 10 MG tablet TAKE 1 TABLET BY MOUTH EVERY DAY 04/20/22   Sueanne Margarita, MD  ferrous sulfate 325 (65 FE) MG EC tablet Take 325 mg by mouth 3 (three) times a week.    [provider]  fluticasone (FLONASE) 50 MCG/ACT nasal spray Place 1 spray into both nostrils daily as needed (congestion).  05/10/17   [provider]  fluticasone (FLOVENT HFA) 110 MCG/ACT inhaler Inhale 1 puff into the lungs as needed.  05/10/17   [provider]  gabapentin (NEURONTIN) 300 MG capsule Take 600 mg by mouth 3 (three) times daily.    [provider]  losartan (COZAAR) 100 MG tablet Take 1 tablet (100 mg total) by mouth daily. 07/31/22   Werner Lean, MD  Multiple Vitamin (MULTIVITAMIN WITH MINERALS) TABS tablet Take 1 tablet by mouth daily.    [provider]  Omega-3 Fatty Acids (FISH OIL PO) Take 1 capsule by mouth daily at 2 PM.     [provider]  omeprazole (PRILOSEC OTC) 20 MG tablet     [provider]  spironolactone (ALDACTONE) 50 MG tablet Take 1 tablet (50 mg total) by mouth daily. 06/10/22   Werner Lean, MD    Inpatient Medications: Scheduled Meds:  aspirin  324 mg Oral Once   enoxaparin (LOVENOX) injection  40 mg Subcutaneous Q24H   Continuous Infusions:  PRN Meds: acetaminophen **OR** acetaminophen, fentaNYL (SUBLIMAZE) injection, naLOXone (NARCAN)  injection  Allergies:    Allergies  Allergen Reactions   Other Other (See Comments)   Ace Inhibitors Cough and Other (See Comments)   Codeine Itching, Rash and Other (See Comments)   Penicillins Itching, Rash and Other (See Comments)    Has patient had a PCN reaction causing immediate rash, facial/tongue/throat swelling, SOB or lightheadedness with hypotension: Yes Has patient had a PCN reaction causing severe rash involving mucus membranes or skin necrosis: No  Has patient had a PCN reaction that required hospitalization: Unknown Has patient had a PCN reaction occurring within the last 10 years: No If all of the above answers are "NO", then may proceed with Cephalosporin use.    Social History:   Social History   Socioeconomic History   Marital status: Single    Spouse name: Not on file   Number of children: Not on file   Years of education: Not on file   Highest education level: Not on file  Occupational History   Not on file  Tobacco Use   Smoking status: Former    Types: Cigarettes    Quit date: 07/13/2004    Years since quitting: 18.0   Smokeless tobacco: Never  Vaping Use   Vaping Use: Never used  Substance and Sexual Activity   Alcohol use: Yes    Comment: ocassionally    Drug use: No   Sexual activity: Not on file  Other Topics Concern   Not on file  Social History Narrative   Not on file   Social Determinants of Health   Financial Resource Strain: Not on file  Food Insecurity: Not on file  Transportation Needs: Not on file  Physical Activity: Not on file  Stress: Not  on file  Social Connections: Not on file  Intimate Partner Violence: Not on file    Family History:    Family History  Problem Relation Age of Onset   Heart attack Mother    Heart disease Mother    Heart attack Brother    Heart disease Brother    Heart attack Sister    Heart disease Sister      Review of Systems: 12 point review of systems negative less otherwise noted in the HPI  Physical Exam/Data:   Vitals:   08/04/22 1930 08/04/22 2000 08/04/22 2121 08/04/22 2200  BP: 118/78 (!) 135/97 135/68 (!) 152/72  Pulse: 82 86 83 81  Resp: (!) 28 20 (!) 23 18  Temp:      TempSrc:      SpO2: 97% 97% 94% 96%  Weight:      Height:       No intake or output data in the 24 hours ending 08/04/22 2321 Filed Weights   08/04/22 1139  Weight: 61.2 kg   Body mass index is 24.69 kg/m.  General:  Well nourished, well developed, in no acute distress*** HEENT: normal Lymph: no adenopathy Neck: no JVD Endocrine:  No thryomegaly Vascular: No carotid bruits; FA pulses 2+ bilaterally without bruits  Cardiac:  normal S1, S2; RRR; no murmur *** Lungs:  clear to auscultation bilaterally, no wheezing, rhonchi or rales  Abd: soft, nontender, no hepatomegaly  Ext: no edema Musculoskeletal:  No deformities, BUE and BLE strength normal and equal Skin: warm and dry  Neuro:  CNs 2-12 intact, no focal abnormalities noted Psych:  Normal affect   EKG:  The EKG was personally reviewed and demonstrates: Sinus rhythm with nonspecific changes Telemetry:  Telemetry was personally reviewed and demonstrates: Sinus rhythm  Relevant CV Studies: Cardiac MRI 05/2022: Normal EF.  Severe asymmetric septal hypertrophy with interventricular septal thickness of 17 mm.  Normal RV function  Laboratory Data:  Chemistry Recent Labs  Lab 08/04/22 1722  NA 134*  K 4.8  CL 98  CO2 23  GLUCOSE 96  BUN 21  CREATININE 1.15*  CALCIUM 10.1  GFRNONAA 47*  ANIONGAP 13    No results for input(s): "PROT",  "ALBUMIN", "AST", "ALT", "  ALKPHOS", "BILITOT" in the last 168 hours. Hematology Recent Labs  Lab 08/04/22 1228  WBC 8.1  RBC 4.08  HGB 12.7  HCT 38.7  MCV 94.9  MCH 31.1  MCHC 32.8  RDW 15.6*  PLT 136*   Cardiac EnzymesNo results for input(s): "TROPONINI" in the last 168 hours. No results for input(s): "TROPIPOC" in the last 168 hours.  BNPNo results for input(s): "BNP", "PROBNP" in the last 168 hours.  DDimer No results for input(s): "DDIMER" in the last 168 hours.  Radiology/Studies:  DG Chest 2 View  Result Date: 08/04/2022 CLINICAL DATA:  Chest pain. EXAM: CHEST - 2 VIEW COMPARISON:  Chest x-ray dated August 30, 2017. FINDINGS: The heart size and mediastinal contours are within normal limits. Normal pulmonary vascularity. Low lung volumes with mild bibasilar atelectasis. No focal consolidation, pleural effusion, or pneumothorax. No acute osseous abnormality. IMPRESSION: 1. Low lung volumes with mild bibasilar atelectasis. Electronically Signed   By: Obie Dredge M.D.   On: 08/04/2022 13:39    Assessment and Plan:   Elevated troponin  {Are we signing off today?:[id number]}  For questions or updates, please contact [facility] Please consult [email or web address] for contact info under     Signed, Joellen Jersey, MD  08/04/2022 11:21 PM

## 2022-08-04 NOTE — ED Triage Notes (Signed)
Pt arrived POV from home c/o centralized CP that radiates down into her left arm that started last night. Pt also endorses SHOB, nausea and dizziness with the pain.

## 2022-08-04 NOTE — ED Notes (Signed)
Pt resting in bed with no acute changes to initial assessment.

## 2022-08-05 ENCOUNTER — Observation Stay (HOSPITAL_BASED_OUTPATIENT_CLINIC_OR_DEPARTMENT_OTHER): Payer: 59

## 2022-08-05 DIAGNOSIS — R072 Precordial pain: Secondary | ICD-10-CM | POA: Diagnosis not present

## 2022-08-05 DIAGNOSIS — R079 Chest pain, unspecified: Secondary | ICD-10-CM | POA: Diagnosis not present

## 2022-08-05 DIAGNOSIS — I421 Obstructive hypertrophic cardiomyopathy: Secondary | ICD-10-CM

## 2022-08-05 DIAGNOSIS — R0789 Other chest pain: Secondary | ICD-10-CM | POA: Diagnosis not present

## 2022-08-05 LAB — ECHOCARDIOGRAM COMPLETE
Area-P 1/2: 4.6 cm2
Calc EF: 65.9 %
Height: 62 in
MV M vel: 5.79 m/s
MV Peak grad: 134.3 mmHg
S' Lateral: 2.8 cm
Single Plane A2C EF: 66.1 %
Single Plane A4C EF: 64.2 %
Weight: 2160 oz

## 2022-08-05 LAB — CBC
HCT: 35.2 % — ABNORMAL LOW (ref 36.0–46.0)
Hemoglobin: 11.6 g/dL — ABNORMAL LOW (ref 12.0–15.0)
MCH: 30.7 pg (ref 26.0–34.0)
MCHC: 33 g/dL (ref 30.0–36.0)
MCV: 93.1 fL (ref 80.0–100.0)
Platelets: 141 10*3/uL — ABNORMAL LOW (ref 150–400)
RBC: 3.78 MIL/uL — ABNORMAL LOW (ref 3.87–5.11)
RDW: 15 % (ref 11.5–15.5)
WBC: 6.7 10*3/uL (ref 4.0–10.5)
nRBC: 0 % (ref 0.0–0.2)

## 2022-08-05 LAB — BASIC METABOLIC PANEL
Anion gap: 12 (ref 5–15)
BUN: 26 mg/dL — ABNORMAL HIGH (ref 8–23)
CO2: 21 mmol/L — ABNORMAL LOW (ref 22–32)
Calcium: 9.3 mg/dL (ref 8.9–10.3)
Chloride: 101 mmol/L (ref 98–111)
Creatinine, Ser: 1.25 mg/dL — ABNORMAL HIGH (ref 0.44–1.00)
GFR, Estimated: 43 mL/min — ABNORMAL LOW (ref >60)
Glucose, Bld: 100 mg/dL — ABNORMAL HIGH (ref 70–99)
Potassium: 4.4 mmol/L (ref 3.5–5.1)
Sodium: 134 mmol/L — ABNORMAL LOW (ref 135–145)

## 2022-08-05 LAB — TROPONIN I (HIGH SENSITIVITY): Troponin I (High Sensitivity): 69 ng/L — ABNORMAL HIGH (ref <18)

## 2022-08-05 MED ORDER — LACTATED RINGERS IV SOLN: INTRAVENOUS | Status: DC

## 2022-08-05 MED ORDER — PERFLUTREN LIPID MICROSPHERE
1.0000 mL | INTRAVENOUS | Status: AC | PRN
  Administered 2022-08-05: 2 mL via INTRAVENOUS

## 2022-08-05 MED ORDER — ONDANSETRON HCL 4 MG PO TABS
4.0000 mg | ORAL_TABLET | Freq: Three times a day (TID) | ORAL | Status: DC | PRN
  Administered 2022-08-05: 4 mg via ORAL
  Filled 2022-08-05: qty 1

## 2022-08-05 NOTE — Progress Notes (Signed)
Rounding Note    Patient Name: [name] Date of Encounter: 08/05/2022  [facility] Cardiologist: Fransico Him, MD  [facility] for HOCM  Subjective   No chest pain this am.   Inpatient Medications    Scheduled Meds:  enoxaparin (LOVENOX) injection  40 mg Subcutaneous Q24H   Continuous Infusions:  PRN Meds: acetaminophen **OR** acetaminophen, fentaNYL (SUBLIMAZE) injection, naLOXone (NARCAN)  injection   Vital Signs    Vitals:   08/05/22 0530 08/05/22 0615 08/05/22 0630 08/05/22 0654  BP: (!) 147/67 125/67 (!) 142/64   Pulse: 79 77 76   Resp: (!) 21 20 20    Temp:    98.8 F (37.1 C)  TempSrc:      SpO2: 93% 96% 93%   Weight:      Height:       No intake or output data in the 24 hours ending 08/05/22 0729    08/04/2022   11:39 AM 07/22/2022    1:30 PM 06/10/2022    4:38 PM  Last 3 Weights  Weight (lbs) 135 lb 134 lb 131 lb  Weight (kg) 61.236 kg 60.782 kg 59.421 kg      Telemetry    Sinus - Personally Reviewed  ECG    NSR, no ischemic changes - Personally Reviewed  Physical Exam   GEN: No acute distress.   Neck: No JVD Cardiac: RRR, systolic murmur.   Respiratory: Clear to auscultation bilaterally. GI: Soft, nontender, non-distended  MS: No LE edema Neuro:  Nonfocal  Psych: Normal affect   Labs    High Sensitivity Troponin:   Recent Labs  Lab 08/04/22 1228 08/04/22 1722 08/05/22 0039  TROPONINIHS 41* 62* 69*     Chemistry Recent Labs  Lab 08/04/22 1722  NA 134*  K 4.8  CL 98  CO2 23  GLUCOSE 96  BUN 21  CREATININE 1.15*  CALCIUM 10.1  GFRNONAA 47*  ANIONGAP 13    Lipids No results for input(s): "CHOL", "TRIG", "HDL", "LABVLDL", "LDLCALC", "CHOLHDL" in the last 168 hours.  Hematology Recent Labs  Lab 08/04/22 1228 08/05/22 0615  WBC 8.1 6.7  RBC 4.08 3.78*  HGB 12.7 11.6*  HCT 38.7 35.2*  MCV 94.9 93.1  MCH 31.1 30.7  MCHC 32.8 33.0  RDW 15.6* 15.0  PLT 136* 141*   Thyroid No results for  input(s): "TSH", "FREET4" in the last 168 hours.  BNPNo results for input(s): "BNP", "PROBNP" in the last 168 hours.  DDimer No results for input(s): "DDIMER" in the last 168 hours.   Radiology    DG Chest 2 View  Result Date: 08/04/2022 CLINICAL DATA:  Chest pain. EXAM: CHEST - 2 VIEW COMPARISON:  Chest x-ray dated August 30, 2017. FINDINGS: The heart size and mediastinal contours are within normal limits. Normal pulmonary vascularity. Low lung volumes with mild bibasilar atelectasis. No focal consolidation, pleural effusion, or pneumothorax. No acute osseous abnormality. IMPRESSION: 1. Low lung volumes with mild bibasilar atelectasis. Electronically Signed   By: Titus Dubin M.D.   On: 08/04/2022 13:39    Cardiac Studies     Patient Profile     82 y.o. female with HOCM, non-obstructive CAD by cath in 2017, prior Takotsubo cardiomyopathy, HTN, HLD, GERD, PAD and an intracranial aneurysm admitted with diarrhea and chest pain.   Assessment & Plan    Chest pain/elevated troponin: Patient with known non-obstructive CAD by cardiac cath in 2017. Now with apparent GI illness with diarrhea, volume loss. Given her history of  HOCM in setting of dehydration and with flat troponin trend, it is likely that her mildly elevated troponin is due to demand ischemia. Anti-coagulation has not been started secondary to her known IC aneurysm. I do not think anti-coagulation is indicated. She had an MRI in November 2023 with normal RV and LV function with moderate MR secondary to [facility]. I do not think an echo is indicated today.  -Her presentation is not c/w ACS. She has no chest pain currently. I will cancel the echo that was ordered. Continue IV fluids today. No plans for cardiac cath so she can eat.  -I do not think she needs an invasive cardiac workup.  -OK to discharge home today after hydration.   For questions or updates, please contact [facility] Please consult [email or web address] for contact  info under        Signed, Lauree Chandler, MD  08/05/2022, 7:29 AM

## 2022-08-05 NOTE — ED Notes (Signed)
RN discussed care update with pt and spouse. Pt informed NPO pending echo for possible cath procedure in future. Pt verbalized understanding

## 2022-08-05 NOTE — ED Notes (Signed)
Breakfast tray ordered 

## 2022-08-05 NOTE — Discharge Planning (Signed)
  Transition of Care [facility]) Screening Note   Patient Details  Name: [name] Date of Birth: 06/03/1941   Transition of Care [facility]) CM/SW Contact:    Fuller Mandril, RN Phone Number: 08/05/2022, 2:06 PM    Transition of Care Department [facility]) has reviewed patient and no TOC needs have been identified at this time. We will continue to monitor patient advancement through interdisciplinary progression rounds. If new patient transition needs arise, please place a TOC consult.

## 2022-08-05 NOTE — ED Notes (Signed)
Pt eating breakfast at bedside.

## 2022-08-05 NOTE — Discharge Summary (Signed)
Physician Discharge Summary   Patient: [name] MRN: [id number] DOB: 1940/10/31  Admit date:     08/04/2022  Discharge date: 08/05/22  Discharge Physician: Tyrone Nine   PCP: [facility]   Recommendations at discharge:  Follow up with PCP in 1-2 weeks with recheck BMP.  Follow up with cardiology for consideration of repeat ischemic evaluation (nonobstructive CAD by cardiac catheterization in 2017).   Discharge Diagnoses: Principal Problem:   Chest pain Active Problems:   Essential hypertension, benign   Peripheral vascular disease, unspecified (HCC)   Dyslipidemia   CAD in native artery   HOCM (hypertrophic obstructive cardiomyopathy) [facility])  Hospital Course: [name] is an 82 y.o. female with medical history significant of nonobstructive CAD, prior Takotsubo cardiomyopathy, HOCM, HTN, HLD, PVD, GERD, posterior communicating artery aneurysm who presented to ED with a chief complaint of chest pain in the setting of a day of loose stools and a couple days of decreased appetite, nausea and poor oral intake. Vital signs stable on arrival.  EKG without acute ischemic changes.  Troponin 41> 62.  Chest x-ray showing low lung volumes with mild bibasilar atelectasis. Mucous membranes were dry, urine specific gravity elevated, so given IV fluids for presumed dehydration. Chest pain has resolved and not recurred. She is tolerating po challenge and has been cleared for discharge per cardiology.    Assessment and Plan: Chest pain, demand myocardial ischemia, HOCM, nonobstructive CAD per [facility] 2017: EKG without acute ischemic changes.  Troponin 41> 62 not consistent with ACS. Suspect more likely related to demand ischemia from dehydration from recent GI illness. Cardiology was consulted, and patient placed in observation with no recurrence of chest pain. Cardiology has low suspicion for ACS, recommends outpatient follow up for consideration of echocardiogram which is  not necessary at this time. We have rehydrated the patient and she's passed po challenge, stable for discharge at this time.    Nausea, vomiting, diarrhea: Abd exam benign, symptoms improved, oral intake facilitated by zofran is adequate to meet hydration and nutritional needs.   Hypertension Hyperlipidemia PVD GERD PCA aneurysm  All chronic medical conditions are stable and will continue outpatient management at discharge.  Consultants: Cardiology Procedures performed: None  Disposition: Home Diet recommendation:  Cardiac diet DISCHARGE MEDICATION: Allergies as of 08/05/2022       Reactions   Other Other (See Comments)   Ace Inhibitors Cough, Other (See Comments)   Codeine Itching, Rash, Other (See Comments)   Penicillins Itching, Rash, Other (See Comments)   Has patient had a PCN reaction causing immediate rash, facial/tongue/throat swelling, SOB or lightheadedness with hypotension: Yes Has patient had a PCN reaction causing severe rash involving mucus membranes or skin necrosis: No Has patient had a PCN reaction that required hospitalization: Unknown Has patient had a PCN reaction occurring within the last 10 years: No If all of the above answers are "NO", then may proceed with Cephalosporin use.        Medication List     TAKE these medications    acetaminophen 325 MG tablet Commonly known as: TYLENOL Take 2 tablets (650 mg total) by mouth every 4 (four) hours as needed for headache or mild pain.   alendronate 70 MG tablet Commonly known as: FOSAMAX Take 70 mg by mouth once a week. Monday   aspirin EC 81 MG tablet Take 1 tablet (81 mg total) by mouth daily.   atorvastatin 80 MG tablet Commonly known as: LIPITOR Take 1 tablet (  80 mg total) by mouth daily.   CALCIUM-D PO Take 1 tablet by mouth daily.   carvedilol 12.5 MG tablet Commonly known as: COREG TAKE 1 TABLET BY MOUTH 2 TIMES DAILY.   ezetimibe 10 MG tablet Commonly known as: ZETIA TAKE 1 TABLET  BY MOUTH EVERY DAY   ferrous sulfate 325 (65 FE) MG EC tablet Take 325 mg by mouth 3 (three) times a week.   FISH OIL PO Take 1 capsule by mouth daily at 2 PM.   fluticasone 110 MCG/ACT inhaler Commonly known as: FLOVENT HFA Inhale 1 puff into the lungs as needed.   fluticasone 50 MCG/ACT nasal spray Commonly known as: FLONASE Place 1 spray into both nostrils daily as needed (congestion).   gabapentin 300 MG capsule Commonly known as: NEURONTIN Take 600 mg by mouth 3 (three) times daily.   losartan 100 MG tablet Commonly known as: COZAAR Take 1 tablet (100 mg total) by mouth daily.   multivitamin with minerals Tabs tablet Take 1 tablet by mouth daily.   omeprazole 20 MG tablet Commonly known as: PRILOSEC OTC Take 20 mg by mouth daily as needed (for heartburn).   spironolactone 50 MG tablet Commonly known as: Aldactone Take 1 tablet (50 mg total) by mouth daily.   Vitamin D 125 MCG (5000 UT) Caps Take 5,000 Units by mouth daily.        Follow-up Information     [facility] Follow up.   Contact information: [address], [address] [place] [address] [phone]         Sueanne Margarita, MD Follow up.   Specialty: Cardiology Contact information: [address] [place] [address] [phone]                Discharge Exam: Danley Danker Weights   08/04/22 1139  Weight: 61.2 kg  BP 118/69   Pulse 95   Temp 98.8 F (37.1 C) (Oral)   Resp 19   Ht 5\' 2"  (1.575 m)   Wt 61.2 kg   SpO2 96%   BMI 24.69 kg/m   Elderly female in no distress requesting a diet so she can eat. Wants to go home. RRR, SEM throughout precordium, no edema Clear, nonlabored RLE prosthesis noted, no LLE edema Soft, NT, ND, +BS  Condition at discharge: stable  The results of significant diagnostics from this hospitalization (including imaging, microbiology, ancillary and laboratory) are listed below for reference.   Imaging  Studies: ECHOCARDIOGRAM COMPLETE  Result Date: 08/05/2022    ECHOCARDIOGRAM REPORT   Patient Name:   [name] Date of Exam: 08/05/2022 Medical Rec #:  [id number]       Height:       62.0 in Accession #:    [id number]      Weight:       135.0 lb Date of Birth:  1941-03-23       BSA:          1.618 m Patient Age:    82 years        BP:           144/62 mmHg Patient Gender: F               HR:           81 bpm. Exam Location:  Inpatient Procedure: 2D Echo and Intracardiac Opacification Agent Indications:    chest pain  History:        Patient  has prior history of Echocardiogram examinations, most                 recent 01/23/2022. HOCM and Hypertrophic Cardiomyopathy; Risk                 Factors:Hypertension and Dyslipidemia.  Sonographer:    Cathie Hoops Referring Phys: [id number] VASUNDHRA RATHORE  Sonographer Comments: Technically difficult study due to poor echo windows. Image acquisition challenging due to respiratory motion. IMPRESSIONS  1. Basal septum 15 mm with calcified contact point from [facility] Prior diagnossi of HOCM with high gradients. would have patient return for limited echo to do better job with LVOT gradients at rest and with valsalva . Left ventricular ejection fraction, by estimation, is 65 to 70%. The left ventricle has normal function. The left ventricle has no regional wall motion abnormalities. There is severe asymmetric left ventricular hypertrophy of the basal and septal segments. Left ventricular diastolic parameters are consistent with Grade I diastolic dysfunction (impaired relaxation).  2. Right ventricular systolic function is normal. The right ventricular size is normal. There is normal pulmonary artery systolic pressure.  3. Mild appearing MR but splay artifact suggests may be more severe due to [facility]. The mitral valve is normal in structure. Mild mitral valve regurgitation. No evidence of mitral stenosis.  4. The aortic valve is tricuspid. Aortic valve regurgitation is not visualized.  No aortic stenosis is present.  5. The inferior vena cava is normal in size with greater than 50% respiratory variability, suggesting right atrial pressure of 3 mmHg. FINDINGS  Left Ventricle: Basal septum 15 mm with calcified contact point from [facility] Prior diagnossi of HOCM with high gradients. would have patient return for limited echo to do better job with LVOT gradients at rest and with valsalva. Left ventricular ejection fraction, by estimation, is 65 to 70%. The left ventricle has normal function. The left ventricle has no regional wall motion abnormalities. Definity contrast agent was given IV to delineate the left ventricular endocardial borders. The left ventricular internal cavity size was normal in size. There is severe asymmetric left ventricular hypertrophy of the basal and septal segments. Left ventricular diastolic parameters are consistent with Grade I diastolic dysfunction (impaired relaxation). Right Ventricle: The right ventricular size is normal. No increase in right ventricular wall thickness. Right ventricular systolic function is normal. There is normal pulmonary artery systolic pressure. The tricuspid regurgitant velocity is 1.58 m/s, and  with an assumed right atrial pressure of 3 mmHg, the estimated right ventricular systolic pressure is 13.0 mmHg. Left Atrium: Left atrial size was normal in size. Right Atrium: Right atrial size was normal in size. Pericardium: There is no evidence of pericardial effusion. Mitral Valve: Mild appearing MR but splay artifact suggests may be more severe due to [facility]. The mitral valve is normal in structure. Mild mitral valve regurgitation. No evidence of mitral valve stenosis. Tricuspid Valve: The tricuspid valve is normal in structure. Tricuspid valve regurgitation is not demonstrated. No evidence of tricuspid stenosis. Aortic Valve: The aortic valve is tricuspid. Aortic valve regurgitation is not visualized. No aortic stenosis is present. Pulmonic Valve: The  pulmonic valve was normal in structure. Pulmonic valve regurgitation is trivial. No evidence of pulmonic stenosis. Aorta: The aortic root is normal in size and structure. Venous: The inferior vena cava is normal in size with greater than 50% respiratory variability, suggesting right atrial pressure of 3 mmHg. IAS/Shunts: No atrial level shunt detected by color flow Doppler.  LEFT VENTRICLE PLAX 2D LVIDd:  4.40 cm     Diastology LVIDs:         2.80 cm     LV e' medial:    4.24 cm/s LV PW:         1.10 cm     LV E/e' medial:  16.9 LV IVS:        1.10 cm     LV e' lateral:   5.00 cm/s LVOT diam:     1.70 cm     LV E/e' lateral: 14.3 LVOT Area:     2.27 cm  LV Volumes (MOD) LV vol d, MOD A2C: 66.8 ml LV vol d, MOD A4C: 67.0 ml LV vol s, MOD A2C: 22.7 ml LV vol s, MOD A4C: 24.0 ml LV SV MOD A2C:     44.1 ml LV SV MOD A4C:     67.0 ml LV SV MOD BP:      44.3 ml RIGHT VENTRICLE RV S prime:     16.60 cm/s LEFT ATRIUM             Index LA Vol (A2C):   28.7 ml 17.74 ml/m LA Vol (A4C):   43.2 ml 26.71 ml/m LA Biplane Vol: 35.1 ml 21.70 ml/m                        PULMONIC VALVE AORTA                 PV Vmax:       1.21 m/s Ao Root diam: 2.70 cm PV Peak grad:  5.9 mmHg  MITRAL VALVE                TRICUSPID VALVE MV Area (PHT): 4.60 cm     TR Peak grad:   10.0 mmHg MV Decel Time: 165 msec     TR Vmax:        158.00 cm/s MR Peak grad: 134.3 mmHg MR Mean grad: 163.0 mmHg    SHUNTS MR Vmax:      579.40 cm/s   Systemic Diam: 1.70 cm MR Vmean:     596.0 cm/s MV E velocity: 71.70 cm/s MV A velocity: 168.00 cm/s MV E/A ratio:  0.43 Jenkins Rouge MD Electronically signed by Jenkins Rouge MD Signature Date/Time: 08/05/2022/12:59:39 PM    Final    DG Chest 2 View  Result Date: 08/04/2022 CLINICAL DATA:  Chest pain. EXAM: CHEST - 2 VIEW COMPARISON:  Chest x-ray dated August 30, 2017. FINDINGS: The heart size and mediastinal contours are within normal limits. Normal pulmonary vascularity. Low lung volumes with mild bibasilar  atelectasis. No focal consolidation, pleural effusion, or pneumothorax. No acute osseous abnormality. IMPRESSION: 1. Low lung volumes with mild bibasilar atelectasis. Electronically Signed   By: Titus Dubin M.D.   On: 08/04/2022 13:39    Microbiology: Results for orders placed or performed during the hospital encounter of 11/21/19  SARS CORONAVIRUS 2 (TAT 6-24 HRS) Nasopharyngeal Nasopharyngeal Swab     Status: None   Collection Time: 11/21/19 11:02 AM   Specimen: Nasopharyngeal Swab  Result Value Ref Range Status   SARS Coronavirus 2 NEGATIVE NEGATIVE Final    Comment: (NOTE) SARS-CoV-2 target nucleic acids are NOT DETECTED. The SARS-CoV-2 RNA is generally detectable in upper and lower respiratory specimens during the acute phase of infection. Negative results do not preclude SARS-CoV-2 infection, do not rule out co-infections with other pathogens, and should not be used as the sole basis for treatment or other  patient management decisions. Negative results must be combined with clinical observations, patient history, and epidemiological information. The expected result is Negative. Fact Sheet for Patients: HairSlick.no Fact Sheet for Healthcare Providers: quierodirigir.com This test is not yet approved or cleared by the [place] FDA and  has been authorized for detection and/or diagnosis of SARS-CoV-2 by FDA under an Emergency Use Authorization (EUA). This EUA will remain  in effect (meaning this test can be used) for the duration of the COVID-19 declaration under Section 56 4(b)(1) of the Act, 21 U.S.C. section 360bbb-3(b)(1), unless the authorization is terminated or revoked sooner. Performed at [facility], [address], [place], [place] [address]     Labs: CBC: Recent Labs  Lab 08/04/22 1228 08/05/22 0615  WBC 8.1 6.7  HGB 12.7 11.6*  HCT 38.7 35.2*  MCV 94.9 93.1  PLT 136* 141*   Basic  Metabolic Panel: Recent Labs  Lab 08/04/22 1722 08/05/22 0615  NA 134* 134*  K 4.8 4.4  CL 98 101  CO2 23 21*  GLUCOSE 96 100*  BUN 21 26*  CREATININE 1.15* 1.25*  CALCIUM 10.1 9.3   Liver Function Tests: No results for input(s): "AST", "ALT", "ALKPHOS", "BILITOT", "PROT", "ALBUMIN" in the last 168 hours. CBG: No results for input(s): "GLUCAP" in the last 168 hours.  Discharge time spent: greater than 30 minutes.  Signed: Tyrone Nine, MD [facility] 08/05/2022

## 2022-08-05 NOTE — ED Notes (Signed)
Pt states she is still feeling nauseous. Attending notified for anti-emetic medication

## 2022-08-05 NOTE — ED Notes (Signed)
Pt states she feels better and ready to go home. Pt ate 75% of her meal and denies nausea at this time.

## 2022-08-05 NOTE — ED Notes (Signed)
Pt ambulated to restroom with walker. Pt stated she felt a little dizzy but was able to ambulate to and from restroom without incident.

## 2022-08-05 NOTE — ED Notes (Signed)
Provided pt emesis bag d/t feeling nauseous after eating. Pt states she hasn't eaten in two days. Pt state she may have ate too fast.

## 2022-08-05 NOTE — Care Management Obs Status (Signed)
[place] NOTIFICATION   Patient Details  Name: [name] MRN: [id number] Date of Birth: 29-Jun-1941   Medicare Observation Status Notification Given:  Yes    Fuller Mandril, RN 08/05/2022, 2:08 PM

## 2022-08-10 ENCOUNTER — Ambulatory Visit: Payer: 59 | Attending: Internal Medicine

## 2022-08-10 DIAGNOSIS — I10 Essential (primary) hypertension: Secondary | ICD-10-CM

## 2022-08-14 ENCOUNTER — Telehealth: Payer: Self-pay | Admitting: *Deleted

## 2022-08-14 DIAGNOSIS — R7989 Other specified abnormal findings of blood chemistry: Secondary | ICD-10-CM

## 2022-08-14 LAB — BASIC METABOLIC PANEL WITH GFR
BUN/Creatinine Ratio: 12 (ref 12–28)
BUN: 40 mg/dL — ABNORMAL HIGH (ref 8–27)
CO2: 20 mmol/L (ref 20–29)
Calcium: 9.3 mg/dL (ref 8.7–10.3)
Chloride: 102 mmol/L (ref 96–106)
Creatinine, Ser: 3.36 mg/dL — ABNORMAL HIGH (ref 0.57–1.00)
Glucose: 96 mg/dL (ref 70–99)
Potassium: 5.1 mmol/L (ref 3.5–5.2)
Sodium: 135 mmol/L (ref 134–144)
eGFR: 13 mL/min/1.73 — ABNORMAL LOW

## 2022-08-14 NOTE — Telephone Encounter (Signed)
Late Entry: Spoke to pt about an hour ago, and then about 10 minutes ago. Creatinine came back at 3.36. Pt has no way of getting back to the office before 5 today, and no way to get to [facility] for stat lab. Discussed w/ DOD, Dr. Irish Lack, and pharmD [facility]. Pt advised to hold Losartan over the weekend, aware we will advise on if/when to restart. Pt advised to come by the office Monday for repeat BMET to check to see if Creatinine is accurate. Pt reports feeling ok, urinating well. Advised to hydrate over the weekend. Aware office will call once Monday lab is reviewed. Patient verbalized understanding and agreeable to plan.

## 2022-08-17 ENCOUNTER — Ambulatory Visit: Payer: 59 | Attending: Cardiology

## 2022-08-17 ENCOUNTER — Telehealth: Payer: Self-pay | Admitting: Pharmacist

## 2022-08-17 DIAGNOSIS — R7989 Other specified abnormal findings of blood chemistry: Secondary | ICD-10-CM

## 2022-08-17 LAB — BASIC METABOLIC PANEL
BUN/Creatinine Ratio: 11 — ABNORMAL LOW (ref 12–28)
BUN: 15 mg/dL (ref 8–27)
CO2: 22 mmol/L (ref 20–29)
Calcium: 9.3 mg/dL (ref 8.7–10.3)
Chloride: 105 mmol/L (ref 96–106)
Creatinine, Ser: 1.36 mg/dL — ABNORMAL HIGH (ref 0.57–1.00)
Glucose: 92 mg/dL (ref 70–99)
Potassium: 5 mmol/L (ref 3.5–5.2)
Sodium: 141 mmol/L (ref 134–144)
eGFR: 39 mL/min/{1.73_m2} — ABNORMAL LOW (ref >59)

## 2022-08-17 NOTE — Telephone Encounter (Signed)
SCr much improved from Friday (? Lab error). Spoke with Dr Gasper Sells, pt to continue holding losartan for now - will recheck BMET on Wednesday when she sees Tessa for follow up. Pt aware.

## 2022-08-18 NOTE — Progress Notes (Unsigned)
Office Visit    Patient Name: [name] Date of Encounter: 08/19/2022  PCP:  Mckinley Jewel, MD   [place]  Cardiologist:  Fransico Him, MD  Advanced Practice Provider:  No care team member to display Electrophysiologist:  None   HPI    [name] is a 82 y.o. female with a past medical history of nonobstructive CAD and HLD with prior Takotsubo cardiomyopathy (normalized myocardium in CMR with septal thickness of 16 mm without LGE), PAD, severe LVOT obstruction (gradient ~43 mmHg), repeat CMR with no LGE (2023) presents today for follow-up visit.  The patient noted that she for started seeing a doctor regularly at age 1.  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  She denied having longstanding high blood pressure.  She was last seen in the office November 2023 and was feeling better than the previous time.  Her SOB and DOE was better and comes and goes.  No difference in her energy level.  Palpitations come and go.  Chest pain is more frequent but they also come and go.  No associated pains with activity.  Today, she tells me that she does have some chest pain and shortness of breath at times.  She also endorses some indigestion and nervousness.  She is on Prilosec as needed.  She states the chest pain sometimes affects her left arm but goes away on its own.  She tries to stay calm.  It happens when she is up moving around and laying down.  It lasts for days at a time.  Nonexertional.  We discussed most recent labs and bump in creatinine.  She has discontinued her losartan and her blood pressure is slightly elevated today at 152/60.  She tells me that at home her systolic blood pressure is between 120-130s.  She remains on Aldactone 50 mg daily.  No lower extremity edema.  Reports no shortness of breath nor dyspnea on exertion.  No edema, orthopnea, PND. Reports no palpitations.   Past  Medical History    Past Medical History:  Diagnosis Date   CAD in native artery    a. 04/2016 NSTEMI with cath showing 25% MCx, 75% DX1 lesion and moderate LVD 35-45% out of proportion to CAD and in a pattern of Takotsubo CM (no clear stressor)   Carotid stenosis    Carotid Doppler showed 40 to 59% stenosis of the right carotid artery and less than 50% stenosis of the right common carotid artery.  There is 1 to 39% stenosis of the left internal carotid artery and greater than 50% stenosis of the common carotid artery on the left.  There is also left subclavian artery stenosis. by dopplers 05/2021   Dyslipidemia    GERD (gastroesophageal reflux disease)    HOCM (hypertrophic obstructive cardiomyopathy) (HCC)    Hypercholesteremia    Hypertension    Hypertensive heart disease    initially she was thought to have a HOCM variant by cath with increased LVOT gradient but echo showed only a 93mmHg gradient across the LVOT and SAM and cardiac MRI did not show any late gad enhancement and only a 52mmHg LVOT gradient with 88mm BSH.  There was some mild SAM and mild MR.  EF normal at 76%.     LVH (left ventricular hypertrophy)    a. cMRI 2018 - showing moderate Basal septal hypertrophy not classic for HOCM morphology with  no delayed gadolium uptake to suggest myofibrillar disarray, + SAM with small LVOT gradient in regard to physiology and mild MR.   Mild aortic stenosis    NSTEMI (non-ST elevated myocardial infarction) (HCC) 04/2016   secondary to stress CM - Takotsubo CM   PAC (premature atrial contraction)    Event monitors 10/2013 and 08/2014 showed NSR, ST, PACs, PVCs.    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   EF  normalized on repeat echo   Past Surgical History:  Procedure Laterality Date   ABDOMINAL HYSTERECTOMY     BELOW KNEE LEG AMPUTATION     converted to AKA 5/08   CARDIAC CATHETERIZATION N/A 04/21/2016   Procedure: Left Heart Cath and Coronary Angiography;  Surgeon: Corky Crafts, MD;  Location: [facility] INVASIVE CV LAB;  Service: Cardiovascular;  Laterality: N/A;   IR GENERIC HISTORICAL  02/13/2016   IR RADIOLOGY PERIPHERAL GUIDED IV START 02/13/2016 Berdine Dance, MD [facility]   IR GENERIC HISTORICAL  02/13/2016   IR US GUIDE VASC ACCESS LEFT 02/13/2016 Berdine Dance, MD [facility]    Allergies  Allergies  Allergen Reactions   Other Other (See Comments)   Ace Inhibitors Cough and Other (See Comments)   Codeine Itching, Rash and Other (See Comments)   Penicillins Itching, Rash and Other (See Comments)    Has patient had a PCN reaction causing immediate rash, facial/tongue/throat swelling, SOB or lightheadedness with hypotension: Yes Has patient had a PCN reaction causing severe rash involving mucus membranes or skin necrosis: No Has patient had a PCN reaction that required hospitalization: Unknown Has patient had a PCN reaction occurring within the last 10 years: No If all of the above answers are "NO", then may proceed with Cephalosporin use.     EKGs/Labs/Other Studies Reviewed:   The following studies were reviewed today: LEFT HEART CATH AND CORONARY ANGIOGRAPHY 04/21/2016   Narrative  Mid Cx lesion, 25 %stenosed.  Ost 1st Diag lesion, 75 %stenosed.  The left ventricular ejection fraction is 35-45% by visual estimate.  There is mild to moderate left ventricular systolic dysfunction in a pattern of Takotsubo cardiomyopathy.  LV end diastolic pressure is mildly elevated.  50-55 mm Hg resting gradient across the LVOT, likely due to subvalvular stenosis. No difficulty crossing the aortic valve.  Severe tortuosity of the right subclavian making torquing catheters  difficult. Consider left radial if cath needed in the future.   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  ACE-I in AM if renal function stable.  No clear source of stress that may have caused this episode.   GATED SPECT MYO PERF W/LEXISCAN STRESS 1D 11/24/2019   Narrative  Nuclear stress EF: 63%.  There was no ST segment deviation noted during stress.  The study is normal.  This is a low risk study.  The left ventricular ejection fraction is normal (55-65%).     ECHO COMPLETE WO IMAGING ENHANCING AGENT 01/23/2022   Narrative ECHOCARDIOGRAM REPORT       Patient Name:   TONNI MANSOUR Date of Exam: 01/23/2022 Medical Rec #:  [id number]       Height:       62.0 in Accession #:  [id number]      Weight:       137.0 lb Date of Birth:  [birth year]       BSA:          1.628 m Patient Age:    57 years        BP:           148/90 mmHg Patient Gender: F               HR:           72 bpm. Exam Location:  [address]   Procedure: 2D Echo, Cardiac Doppler and Color Doppler   Indications:    I35.0 Nonrheumatic aortic (valve) stenosis   History:        Patient has prior history of Echocardiogram examinations, most recent 11/24/2019. CHF, CAD and Previous Myocardial Infarction, Arrythmias:PVC and PAC; Risk Factors:Dyslipidemia. Palpitations. Hypertensive heart disease. PVD. Takotsubo cardiomyopathy. LVH. Left subclavian artery stensosis. Brain aneurysm.   Sonographer:    Diamond Nickel RCS Referring Phys: [place]     1. Left ventricular ejection fraction, by estimation, is 60 to 65%. The left ventricle has normal function. The left ventricle has no regional wall motion abnormalities. There is severe asymmetric left ventricular hypertrophy of the basal-septal segment. LVOT gradient due to SAM, measures up to 35mmHg at rest and 32mmHg with Valsalva 2. Right ventricular systolic function is normal. The right ventricular size is  normal. 3. The mitral valve is normal in structure. Mild to moderate mitral valve regurgitation. No evidence of mitral stenosis. MR is secondary to [facility] 4. The aortic valve is tricuspid. Aortic valve regurgitation is not visualized. Aortic valve sclerosis is present, with no evidence of aortic valve stenosis.   Conclusion(s)/Recommendation(s): Findings consistent with HOCM. Prior MRI from 2018 is poor quality, would recommend repeating cardiac MRI.   CARDIAC TELEMETRY MONITORING-INTERPRETATION ONLY 03/24/2019   Narrative  Sinus bradycardia, normal sinus rhythm and sinus tachycardia with average heart rate 61bpm.  Occasional PVCs with PVC load 1%.   CT CORONARY MORPH W/CTA COR W/SCORE W/CA W/CM &/OR WO/CM 02/13/2016   Addendum 02/13/2016  5:40 PM ADDENDUM REPORT: 02/13/2016 17:38   CLINICAL DATA:  Chest pain   EXAM: Cardiac CTA   MEDICATIONS: Sub lingual nitro. 4mg  and lopressor 10mg    TECHNIQUE: Patient was a difficult iv cannulation. Dr Larena Glassman eventually used micro-puncture technique in IR radiology to start iv   The patient was scanned on a Philips 623 slice scanner. Gantry rotation speed was 270 msecs. Collimation was .43mm. A 100 kV prospective scan was triggered in the descending thoracic aorta at 111 HU's with 5% padding centered around 78% of the R-R interval. Average HR during the scan was 72 bpm. The 3D data set was interpreted on a dedicated work station using MPR, MIP and VRT modes. A total of 80cc of contrast was used.   FINDINGS: Non-cardiac: See separate report from [facility]. No significant findings on limited lung and soft tissue windows.   Calcium Score:  2 with isolated area of calcification in LM   Coronary Arteries: Right dominant with no anomalies   LM:  Small area of non obstructive calcium   LAD:  Normal tortuous   D1:  Normal large and branching   Circumflex:  Left dominant normal   OM1: Normal   OM2: Normal   RCA:   Small and non dominant normal   PDA:  Left sided normal   PLA:  Left sided normal   IMPRESSION: 1) Poor quality study due to motion artifact and somewhat poor filling of coronary arteries with tortuosity   2) Calcium score 2 isolated foci in LM 23rd percentile for age and sex matched controls   3) Normal left dominant coronary arteries with noted motion artifact and poor distal vessel visualization   Jenkins Rouge     Electronically Signed By: Jenkins Rouge M.D. On: 02/13/2016 17:38   Narrative EXAM: OVER-READ INTERPRETATION  CT CHEST   The following report is an over-read performed by radiologist Dr. Rebekah [facility] on 02/13/2016. This over-read does not include interpretation of cardiac or coronary anatomy or pathology. The coronary calcium score/coronary CTA interpretation by the cardiologist is attached.   COMPARISON:  None.   FINDINGS: Aortic atherosclerosis. Within the visualized portions of the thorax there are no suspicious appearing pulmonary nodules or masses, there is no acute consolidative airspace disease, no pleural effusions, no pneumothorax and no lymphadenopathy. Visualized portions of the upper abdomen again demonstrate aortic atherosclerosis. High density material in the collecting systems of the kidneys bilaterally, presumably contrast material related to test bolus injection. There are no aggressive appearing lytic or blastic lesions noted in the visualized portions of the skeleton.   IMPRESSION: 1. Aortic atherosclerosis. 2. No acute incidental noncardiac findings noted.   Electronically Signed: By: Vinnie Langton M.D. On: 02/13/2016 16:32   No results found for this or any previous visit from the past 3650 days.   MR CARDIAC MORPHOLOGY W WO CONTRAST 07/27/2016   Narrative CLINICAL DATA:  Hypertrophic Cardiomyopathy   EXAM: CARDIAC MRI   TECHNIQUE: The patient was scanned on a 1.5 Tesla GE magnet. A  dedicated cardiac coil was used. Functional imaging was done using F[place]a sequences. 2,3, and 4 chamber views were done to assess for RWMA's. Modified Simpson's rule using a short axis stack was used to calculate an ejection fraction on a dedicated work Conservation officer, nature. The patient received 21 cc of Multihance. After 10 minutes inversion recovery sequences were used to assess for infiltration and scar tissue.   CONTRAST:  21 cc Multihance   FINDINGS: Thee was mild LAE. The RA/RV were normal in size and function. There was no ASD/VSD or pericardial effusion. The aortic root was normal. The aortic valve was trileaflet with no stenosis. There was moderate basal septal hypertrophy 16 mm compared to the mid septum and posterior wall at 8 mm. There appear to be SAM. By flow analysis only a mild LVOT gradient of 5 mmHg. There was mild MR   The quantitative EF was 76% (EDV 62 cc SV 47 cc EDV 16 cc) Delayed enhancement images post gadolinium were normal with no evidence of scar or myofibrillar disarray in the septum   IMPRESSION: 1) Moderate Basal septal hypertrophy not classic for HOCM morphology with no delayed gadolium uptake to suggest myofibrillar disarray (16 mm BSH)   2) There was SAM with small LVOT gradient in regard to physiology and mild MR   3) Mild LAE   4) No delayed gadolinium uptake on inversion recovery sequences.   Peter Nishan       VAS US CAROTID DUPLEX BILATERAL 05/16/2021   Summary: Right Carotid: Velocities in the right ICA are consistent with a 40-59% stenosis. Non-hemodynamically significant plaque <50% noted in the CCA. The ECA appears >50% stenosed. Stenosis based on peak systolic velocities and plaque formation.   Left Carotid: Velocities in the left ICA are consistent with  a 1-39% stenosis. Hemodynamically significant plaque >50% visualized in the CCA. The ECA appears >50% stenosed.   Vertebrals:  Bilateral vertebral arteries  demonstrate antegrade flow. Subclavians: Left subclavian artery was stenotic. Bilateral subclavian artery flow was disturbed.   *See table(s) above for measurements and observations. Suggest follow up study in 12 months.   EKG:  EKG is not ordered today.    Recent Labs: 08/05/2022: Hemoglobin 11.6; Platelets 141 08/17/2022: BUN 15; Creatinine, Ser 1.36; Potassium 5.0; Sodium 141  Recent Lipid Panel    Component Value Date/Time   CHOL 154 09/16/2020 1153   TRIG 60 09/16/2020 1153   HDL 55 09/16/2020 1153   CHOLHDL 2.8 09/16/2020 1153   CHOLHDL 3.9 04/22/2016 0017   VLDL 52 (H) 04/22/2016 0017   LDLCALC 87 09/16/2020 1153    Home Medications   Current Meds  Medication Sig   acetaminophen (TYLENOL) 325 MG tablet Take 2 tablets (650 mg total) by mouth every 4 (four) hours as needed for headache or mild pain.   alendronate (FOSAMAX) 70 MG tablet Take 70 mg by mouth once a week. Monday   aspirin EC 81 MG EC tablet Take 1 tablet (81 mg total) by mouth daily.   atorvastatin (LIPITOR) 80 MG tablet Take 1 tablet (80 mg total) by mouth daily.   Calcium Carbonate-Vitamin D (CALCIUM-D PO) Take 1 tablet by mouth daily.   carvedilol (COREG) 12.5 MG tablet TAKE 1 TABLET BY MOUTH 2 TIMES DAILY.   Cholecalciferol (VITAMIN D) 125 MCG (5000 UT) CAPS Take 5,000 Units by mouth daily.   ezetimibe (ZETIA) 10 MG tablet TAKE 1 TABLET BY MOUTH EVERY DAY   ferrous sulfate 325 (65 FE) MG EC tablet Take 325 mg by mouth 3 (three) times a week.   fluticasone (FLONASE) 50 MCG/ACT nasal spray Place 1 spray into both nostrils daily as needed (congestion).    fluticasone (FLOVENT HFA) 110 MCG/ACT inhaler Inhale 1 puff into the lungs as needed.    gabapentin (NEURONTIN) 300 MG capsule Take 600 mg by mouth 3 (three) times daily.   losartan (COZAAR) 100 MG tablet Take 1 tablet (100 mg total) by mouth daily.   Multiple Vitamin (MULTIVITAMIN WITH MINERALS) TABS tablet Take 1 tablet by mouth daily.   Omega-3 Fatty  Acids (FISH OIL PO) Take 1 capsule by mouth daily at 2 PM.    omeprazole (PRILOSEC OTC) 20 MG tablet Take 20 mg by mouth daily as needed (for heartburn).   spironolactone (ALDACTONE) 50 MG tablet Take 1 tablet (50 mg total) by mouth daily.     Review of Systems      All other systems reviewed and are otherwise negative except as noted above.  Physical Exam    VS:  BP (!) 152/60   Pulse 75   Ht 5\' 2"  (1.575 m)   Wt 126 lb 9.6 oz (57.4 kg)   SpO2 98%   BMI 23.16 kg/m  , BMI Body mass index is 23.16 kg/m.  Wt Readings from Last 3 Encounters:  08/19/22 126 lb 9.6 oz (57.4 kg)  08/04/22 135 lb (61.2 kg)  07/22/22 134 lb (60.8 kg)     GEN: Well nourished, well developed, in no acute distress. HEENT: normal. Neck: Supple, no JVD, carotid bruits, or masses. Cardiac: RRR, no murmurs, rubs, or gallops. No clubbing, cyanosis, edema.  Radials/PT 2+ and equal bilaterally.  Respiratory:  Respirations regular and unlabored, clear to auscultation bilaterally. GI: Soft, nontender, nondistended. MS: No deformity or atrophy. Skin: Warm  and dry, no rash. Neuro:  Strength and sensation are intact. Psych: Normal affect.  Assessment & Plan    Atypical chest pain -non-exertional component -lasts for hours regardless of activity -recent ED eval with troponin 42 >>71 -EKG unremarkable -close follow-up recommended  Hypertrophic cardiomyopathy -Recent increase to 50 mg of Aldactone, continue Coreg 12.5mg  BID and losartan stopped due to increase in creatinine -HCTZ stopped by pharmacy back in January -Repeat a BMET today -repeat echo when she comes back to the office in a few months  Hypertension -slightly high today -at home 120s-130s -continue to monitor closely at home -continue to hold Losartan at this time  Hyperlipidemia -LDL 40 -continue lipitor 80mg  daily -continue zetia 10mg  daily     Disposition: Follow up 2-3 months with Fransico Him, MD or APP.  Signed, Elgie Collard, PA-C 08/19/2022, 5:00 PM [place]

## 2022-08-19 ENCOUNTER — Ambulatory Visit: Payer: 59 | Attending: Physician Assistant | Admitting: Physician Assistant

## 2022-08-19 ENCOUNTER — Encounter: Payer: Self-pay | Admitting: Physician Assistant

## 2022-08-19 VITALS — BP 152/60 | HR 75 | Ht 62.0 in | Wt 126.6 lb

## 2022-08-19 DIAGNOSIS — I10 Essential (primary) hypertension: Secondary | ICD-10-CM

## 2022-08-19 DIAGNOSIS — E785 Hyperlipidemia, unspecified: Secondary | ICD-10-CM | POA: Diagnosis not present

## 2022-08-19 DIAGNOSIS — I421 Obstructive hypertrophic cardiomyopathy: Secondary | ICD-10-CM

## 2022-08-19 DIAGNOSIS — I5181 Takotsubo syndrome: Secondary | ICD-10-CM

## 2022-08-19 NOTE — Patient Instructions (Signed)
Medication Instructions:  Your physician recommends that you continue on your current medications as directed. Please refer to the Current Medication list given to you today.  *If you need a refill on your cardiac medications before your next appointment, please call your pharmacy*   Lab Work: BMET today If you have labs (blood work) drawn today and your tests are completely normal, you will receive your results only by: [facility] (if you have MyChart) OR A paper copy in the mail If you have any lab test that is abnormal or we need to change your treatment, we will call you to review the results.   Testing/Procedures: Your physician has requested that you have an echocardiogram prior to 12/31/22 appointment. Echocardiography is a painless test that uses sound waves to create images of your heart. It provides your doctor with information about the size and shape of your heart and how well your heart's chambers and valves are working. This procedure takes approximately one hour. There are no restrictions for this procedure. Please do NOT wear cologne, perfume, aftershave, or lotions (deodorant is allowed). Please arrive 15 minutes prior to your appointment time.    Follow-Up: At [facility], you and your health needs are our priority.  As part of our continuing mission to provide you with exceptional heart care, we have created designated Provider Care Teams.  These Care Teams include your primary Cardiologist (physician) and Advanced Practice Providers (APPs -  Physician Assistants and Nurse Practitioners) who all work together to provide you with the care you need, when you need it.   Your next appointment:   12/31/2022 at 1:30 PM  Provider:   Dr Gasper Sells  Other Instructions Check your blood pressure at home daily, one hour after taking your morning medications, keep a log and if your systolic (top number) is 188 or greater on multiple readings, call and let us know.

## 2022-08-20 LAB — BASIC METABOLIC PANEL
BUN/Creatinine Ratio: 10 — ABNORMAL LOW (ref 12–28)
BUN: 10 mg/dL (ref 8–27)
CO2: 21 mmol/L (ref 20–29)
Calcium: 9.7 mg/dL (ref 8.7–10.3)
Chloride: 101 mmol/L (ref 96–106)
Creatinine, Ser: 1 mg/dL (ref 0.57–1.00)
Glucose: 81 mg/dL (ref 70–99)
Potassium: 4.6 mmol/L (ref 3.5–5.2)
Sodium: 139 mmol/L (ref 134–144)
eGFR: 56 mL/min/{1.73_m2} — ABNORMAL LOW (ref >59)

## 2022-08-20 NOTE — Addendum Note (Signed)
Addended by: ,  E on: 08/20/2022 04:52 PM   Modules accepted: Orders

## 2022-08-24 ENCOUNTER — Telehealth: Payer: Self-pay | Admitting: Internal Medicine

## 2022-08-24 ENCOUNTER — Encounter: Payer: Self-pay | Admitting: Cardiology

## 2022-08-24 DIAGNOSIS — I421 Obstructive hypertrophic cardiomyopathy: Secondary | ICD-10-CM

## 2022-08-24 DIAGNOSIS — I10 Essential (primary) hypertension: Secondary | ICD-10-CM

## 2022-08-24 MED ORDER — LOSARTAN POTASSIUM 25 MG PO TABS: 25.0000 mg | ORAL_TABLET | Freq: Every day | ORAL | 3 refills | Status: DC

## 2022-08-24 NOTE — Telephone Encounter (Signed)
Spoke with the patient and advised on recommendations from [place]. Patient verbalized understanding. Rx has been sent in.

## 2022-08-24 NOTE — Telephone Encounter (Signed)
Pt c/o medication issue:  1. Name of Medication: Losartan   2. How are you currently taking this medication (dosage and times per day)? Not currently taking   3. Are you having a reaction (difficulty breathing--STAT)?   4. What is your medication issue? Patient states she was taken off of this medication until her kidney function came back up and after hearing her results of labs today, she would like to know if she should still continue to hold this medication. Requesting return call.

## 2022-08-24 NOTE — Telephone Encounter (Signed)
Error

## 2022-09-18 ENCOUNTER — Other Ambulatory Visit: Payer: Self-pay | Admitting: Cardiology

## 2022-10-13 NOTE — Telephone Encounter (Signed)
Received immunization report from [facility] indicating patient had received Arexvy 120 mcg IM on 10/12/22 and Comirnaty 12+GFS 23-24 Inj IM. It is noted that patient had already received Arexvy on  03/20/22. Dr. Shanda Howells notified.

## 2022-10-13 NOTE — Telephone Encounter (Signed)
Only 1 RSV vaccine is recommended, but I am not aware of any risk or harm of getting a second vaccine.  I assume that that was decided between patient and pharmacy.

## 2022-10-19 ENCOUNTER — Other Ambulatory Visit: Payer: Self-pay | Admitting: Internal Medicine

## 2022-10-19 ENCOUNTER — Telehealth: Payer: Self-pay | Admitting: Orthopedic Surgery

## 2022-10-19 DIAGNOSIS — Z1231 Encounter for screening mammogram for malignant neoplasm of breast: Secondary | ICD-10-CM

## 2022-10-19 NOTE — Telephone Encounter (Signed)
Patient needs 2 sleeves  for her right amputated leg please send to [facility]  [address] [place], [place] [address]. [phone]

## 2022-10-19 NOTE — Telephone Encounter (Signed)
Patient has not been seen since 2021. Insurance will not approve of any prosthetic supplies/materials if office visit has been greater than 6 months. Can you call pt to schedule her an appt for follow up? Thank you.

## 2022-10-27 ENCOUNTER — Ambulatory Visit (INDEPENDENT_AMBULATORY_CARE_PROVIDER_SITE_OTHER): Payer: 59 | Admitting: Family

## 2022-10-27 DIAGNOSIS — Z89511 Acquired absence of right leg below knee: Secondary | ICD-10-CM

## 2022-10-28 ENCOUNTER — Encounter: Payer: Self-pay | Admitting: Family

## 2022-10-28 NOTE — Progress Notes (Signed)
Office Visit Note   Patient: [name]           Date of Birth: 23-Dec-1940           MRN: [id number] Visit Date: 10/27/2022              Requested by: Ollen Bowl, MD [address] [place],  [place] [address] PCP: Ollen Bowl, MD  Chief Complaint  Patient presents with   Right Leg - Follow-up    Hx BKA       HPI: The patient is a 82 year old woman who is seen status post right below-knee amputation, remote.  She states that her current prosthesis is ill fitting there is release of air and shifting when she steps on her current prosthesis.  Reports volume loss weight loss.  She states it is over 60 years old.  She is requesting an order for new prosthesis materials.  Patient is an existing right transtibial  amputee.  Patient's current comorbidities are not expected to impact the ability to function with the prescribed prosthesis. Patient verbally communicates a strong desire to use a prosthesis. Patient currently requires mobility aids to ambulate without a prosthesis.  Expects not to use mobility aids with a new prosthesis.  Patient is a K2 level ambulator that will use a prosthesis to walk around their home and the community over low level environmental barriers.      Assessment & Plan: Visit Diagnoses: No diagnosis found.  Plan: Given an order for new prosthesis set up with supplies.  She will follow-up with [place] as needed.  Follow-Up Instructions: No follow-ups on file.   Ortho Exam  Patient is alert, oriented, no adenopathy, well-dressed, normal affect, normal respiratory effort. On examination of the right residual limb this is well consolidated well-healed there is no ulceration no callus no impending skin breakdown  Imaging: No results found. No images are attached to the encounter.  Labs: No results found for: "HGBA1C", "ESRSEDRATE", "CRP", "LABURIC", "REPTSTATUS", "GRAMSTAIN", "CULT", "LABORGA"   Lab Results  Component Value  Date   ALBUMIN 4.3 09/16/2020   ALBUMIN 3.8 04/21/2016    Lab Results  Component Value Date   MG 2.1 02/03/2019   MG 2.4 (H) 11/16/2017   MG 2.0 04/21/2016   No results found for: "VD25OH"  No results found for: "PREALBUMIN"    Latest Ref Rng & Units 08/05/2022    6:15 AM 08/04/2022   12:28 PM 02/26/2022    2:45 PM  CBC EXTENDED  WBC 4.0 - 10.5 K/uL 6.7  8.1  6.8   RBC 3.87 - 5.11 MIL/uL 3.78  4.08  3.66   Hemoglobin 12.0 - 15.0 g/dL 19.1  47.8  29.5   HCT 36.0 - 46.0 % 35.2  38.7  33.5   Platelets 150 - 400 K/uL 141  136  152      There is no height or weight on file to calculate BMI.  Orders:  No orders of the defined types were placed in this encounter.  No orders of the defined types were placed in this encounter.    Procedures: No procedures performed  Clinical Data: No additional findings.  ROS:  All other systems negative, except as noted in the HPI. Review of Systems  Objective: Vital Signs: There were no vitals taken for this visit.  Specialty Comments:  No specialty comments available.  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 04/2016 NSTEMI with cath showing 25% MCx, 75% DX1 lesion and moderate LVD 35-45% out of proportion to CAD and in a pattern of Takotsubo CM (no clear stressor)   Carotid stenosis    Carotid  Doppler showed 40 to 59% stenosis of the right carotid artery and less than 50% stenosis of the right common carotid artery.  There is 1 to 39% stenosis of the left internal carotid artery and greater than 50% stenosis of the common carotid artery on the left.  There is also left subclavian artery stenosis. by dopplers 05/2021   Dyslipidemia    GERD (gastroesophageal reflux disease)    HOCM (hypertrophic obstructive cardiomyopathy) (HCC)    Hypercholesteremia    Hypertension    Hypertensive heart disease    initially she was thought to have a HOCM variant by cath with increased LVOT gradient but echo showed only a gradient across the LVOT and SAM and cardiac MRI did not show any late gad enhancement and only a LVOT gradient with 15mm BSH.  There was some mild SAM and mild MR.  EF normal at 76%.     LVH (left ventricular hypertrophy)    a. cMRI 2018 - showing moderate Basal septal hypertrophy not classic for HOCM morphology with no delayed gadolium uptake to suggest myofibrillar disarray, + SAM with small LVOT gradient in regard to physiology and mild MR.   Mild aortic stenosis    NSTEMI (non-ST elevated myocardial infarction) (HCC) 04/2016   secondary to stress CM - Takotsubo CM   PAC (premature atrial contraction)    Event monitors 10/2013 and 08/2014 showed NSR, ST, PACs, PVCs.    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   EF normalized on repeat echo    Family History  Problem Relation Age of Onset   Heart attack Mother    Heart disease Mother    Heart attack Brother    Heart disease Brother    Heart attack Sister    Heart disease Sister     Past Surgical History:  Procedure Laterality Date    ABDOMINAL HYSTERECTOMY     BELOW KNEE LEG AMPUTATION     converted to AKA 5/08   CARDIAC CATHETERIZATION N/A 04/21/2016   Procedure: Left Heart Cath and Coronary Angiography;  Surgeon: Corky Crafts, MD;  Location: [facility] IN[facility]ASIVE [facility];  Service: Cardiovascular;  Laterality: N/A;   IR GENERIC HISTORICAL  02/13/2016   IR RADIOLOGY PERIPHERAL GUIDED IV START 02/13/2016 Berdine Dance, MD [facility]   IR GENERIC HISTORICAL  02/13/2016   IR US GUIDE VASC ACCESS LEFT 02/13/2016 Berdine Dance,  MD [facility]   Social History   Occupational History   Not on file  Tobacco Use   Smoking status: Former    Types: Cigarettes    Quit date: 07/13/2004    Years since quitting: 18.3   Smokeless tobacco: Never  Vaping Use   Vaping Use: Never used  Substance and Sexual Activity   Alcohol use: Yes    Comment: ocassionally    Drug use: No   Sexual activity: Not on file

## 2022-10-29 DIAGNOSIS — H179 Unspecified corneal scar and opacity: Secondary | ICD-10-CM | POA: Diagnosis not present

## 2022-10-29 DIAGNOSIS — H35363 Drusen (degenerative) of macula, bilateral: Secondary | ICD-10-CM | POA: Diagnosis not present

## 2022-10-29 DIAGNOSIS — H43393 Other vitreous opacities, bilateral: Secondary | ICD-10-CM | POA: Diagnosis not present

## 2022-10-29 DIAGNOSIS — H524 Presbyopia: Secondary | ICD-10-CM | POA: Diagnosis not present

## 2022-10-29 DIAGNOSIS — H04123 Dry eye syndrome of bilateral lacrimal glands: Secondary | ICD-10-CM | POA: Diagnosis not present

## 2022-11-10 ENCOUNTER — Telehealth: Payer: Self-pay | Admitting: Family

## 2022-11-10 NOTE — Telephone Encounter (Signed)
10/27/22 progress note faxed to [facility] along with prescription-swo [phone]

## 2022-11-16 DIAGNOSIS — G546 Phantom limb syndrome with pain: Secondary | ICD-10-CM | POA: Diagnosis not present

## 2022-11-16 DIAGNOSIS — I130 Hypertensive heart and chronic kidney disease with heart failure and stage 1 through stage 4 chronic kidney disease, or unspecified chronic kidney disease: Secondary | ICD-10-CM | POA: Diagnosis not present

## 2022-11-16 DIAGNOSIS — N1831 Chronic kidney disease, stage 3a: Secondary | ICD-10-CM | POA: Diagnosis not present

## 2022-11-16 DIAGNOSIS — I509 Heart failure, unspecified: Secondary | ICD-10-CM | POA: Diagnosis not present

## 2022-11-16 DIAGNOSIS — D509 Iron deficiency anemia, unspecified: Secondary | ICD-10-CM | POA: Diagnosis not present

## 2022-11-16 DIAGNOSIS — I2510 Atherosclerotic heart disease of native coronary artery without angina pectoris: Secondary | ICD-10-CM | POA: Diagnosis not present

## 2022-11-16 DIAGNOSIS — I739 Peripheral vascular disease, unspecified: Secondary | ICD-10-CM | POA: Diagnosis not present

## 2022-11-16 DIAGNOSIS — D631 Anemia in chronic kidney disease: Secondary | ICD-10-CM | POA: Diagnosis not present

## 2022-11-16 DIAGNOSIS — J4520 Mild intermittent asthma, uncomplicated: Secondary | ICD-10-CM | POA: Diagnosis not present

## 2022-11-16 DIAGNOSIS — Z89611 Acquired absence of right leg above knee: Secondary | ICD-10-CM | POA: Diagnosis not present

## 2022-11-16 DIAGNOSIS — I10 Essential (primary) hypertension: Secondary | ICD-10-CM | POA: Diagnosis not present

## 2022-11-16 DIAGNOSIS — K219 Gastro-esophageal reflux disease without esophagitis: Secondary | ICD-10-CM | POA: Diagnosis not present

## 2022-11-19 ENCOUNTER — Encounter: Attending: Internal Medicine | Primary: Internal Medicine

## 2022-11-30 ENCOUNTER — Ambulatory Visit
Admission: RE | Admit: 2022-11-30 | Discharge: 2022-11-30 | Disposition: A | Discharge status: Home / Self Care | Payer: 59 | Source: Ambulatory Visit | Attending: Internal Medicine | Admitting: Internal Medicine

## 2022-11-30 DIAGNOSIS — Z1231 Encounter for screening mammogram for malignant neoplasm of breast: Secondary | ICD-10-CM

## 2022-12-01 DIAGNOSIS — Z89611 Acquired absence of right leg above knee: Secondary | ICD-10-CM | POA: Diagnosis not present

## 2022-12-10 ENCOUNTER — Ambulatory Visit: Admit: 2022-12-10 | Payer: MEDICARE | Attending: Internal Medicine | Primary: Internal Medicine

## 2022-12-10 ENCOUNTER — Encounter

## 2022-12-10 DIAGNOSIS — I10 Essential (primary) hypertension: Secondary | ICD-10-CM

## 2022-12-10 DIAGNOSIS — R32 Unspecified urinary incontinence: Secondary | ICD-10-CM

## 2022-12-10 LAB — AMB POC URINALYSIS DIP STICK AUTO W/O MICRO
Blood (UA POC): NEGATIVE
Glucose, Urine, POC: NEGATIVE
KETONES, Urine, POC: NEGATIVE
Nitrite, Urine, POC: NEGATIVE
Protein, Urine, POC: 30
Specific Gravity, Urine, POC: 1.03 (ref 1.001–1.035)
Urobilinogen, POC: 0.2
pH, Urine, POC: 5.5 (ref 4.6–8.0)

## 2022-12-10 MED ORDER — TELMISARTAN 80 MG PO TABS
80 | ORAL_TABLET | Freq: Every day | ORAL | 3 refills | Status: DC
Start: 2022-12-10 — End: 2024-04-20

## 2022-12-10 MED ORDER — TRIAMCINOLONE ACETONIDE 0.1 % EX CREA
0.1 | CUTANEOUS | 3 refills | Status: AC
Start: 2022-12-10 — End: ?

## 2022-12-10 MED ORDER — PREDNISONE 20 MG PO TABS
20 MG | ORAL_TABLET | ORAL | 0 refills | Status: DC
Start: 2022-12-10 — End: 2023-01-06

## 2022-12-10 MED ORDER — AMLODIPINE BESYLATE 5 MG PO TABS
5 MG | ORAL_TABLET | Freq: Every day | ORAL | 0 refills | Status: DC
Start: 2022-12-10 — End: 2023-01-06

## 2022-12-10 NOTE — Progress Notes (Signed)
"\"  Have you been to the ER, urgent care clinic since your last visit?  Hospitalized since your last visit?\"    NO    \"Have you seen or consulted any other health care providers outside of [facility] since your last visit?\"    YES - When: approximately 1 months ago.  Where and Why: Oncology - Dr. Harmon Dun.            Click Here for Release of Records Request"

## 2022-12-10 NOTE — Progress Notes (Signed)
HISTORY OF PRESENT ILLNESS    Chief Complaint   Patient presents with    Medication Check    Medication Refill    Hypertension    Lab Collection     Fasting.        Presents for follow-up    Reports hx of UTI.  She is concerned about prior hx.  Denies any current s/s other than chronic polyuria.  Denies dysuria, hematuria.      Reports leg cramps at night.      Hypertension  Hypertension ROS: taking medications chlorthalidone qod due to urination and leg cramps  no medication side effects noted, no TIA's, no chest pain on exertion, no dyspnea on exertion, no swelling of ankles     reports that she has never smoked. She has never used smokeless tobacco.    reports no history of alcohol use.   BP Readings from Last 2 Encounters:   12/10/22 (!) 172/75   04/02/21 135/70     Hyperlipidemia  ROS: taking medications as instructed, no medication side effects noted  No new myalgias, no joint pains, no weakness  No TIA's, no chest pain on exertion, no dyspnea on exertion, no swelling of ankles.   Lab Results   Component Value Date    CHOL 233 (H) 12/10/2022    TRIG 105 12/10/2022    HDL 73 12/10/2022    LDL 139 (H) 12/10/2022    VLDL 21 12/10/2022    CHOLHDLRATIO 3.2 12/10/2022       Endometrial carcinoma 02/2019  Dr Montez Morita left practice.  Seeing Dr Shelva Majestic since 10/2022.      Asks of prednisone and cream for poison ivy.  Mild R wrist and abd s/s now.  Improving.  She expects more contact later in the summer.    Review of Systems   All other systems reviewed and are negative, except as noted in HPI    Past Medical and Surgical History   has a past medical history of Endometrial carcinoma (HCC), Essential hypertension, Hematuria, Knee pain, Mixed hyperlipidemia, Nausea & vomiting, Osteopenia, Pap smear for cervical cancer screening, Spider varicose veins, Urinary incontinence, and Vitamin D deficiency.     has a past surgical history that includes Colonoscopy (01/2010); Total knee arthroplasty (2012); Knee arthroscopy; other  surgical history (01/2019); and Total abdominal hysterectomy w/ bilateral salpingoophorectomy (02/13/2019).     reports that she has never smoked. She has never used smokeless tobacco. She reports that she does not drink alcohol and does not use drugs.    family history includes Heart Disease in her brother and mother; Hypertension in her brother and mother; Kidney Disease in her mother; Lung Disease in her brother and father.    Physical Exam   Nursing note and vitals reviewed.  Blood pressure 136/78, pulse 71, temperature 98 F (36.7 C), temperature source Oral, resp. rate 18, height 1.626 m (5\' 4" ), weight 76.3 kg (168 lb 3.2 oz), SpO2 96 %.  Constitutional:  No distress.    Eyes: Conjunctivae are normal.   Ears:  Hearing grossly intact  Cardiovascular: Normal rate. regular rhythm, no murmurs or gallops  No edema  Pulmonary/Chest: Effort normal.   CTAB  Musculoskeletal: moves all 4 extremities   Neurological: Alert and oriented to person, place, and time.   Skin: No visible rash noted.   Psychiatric: Normal mood and affect. Behavior is normal.     Assessment and Plan    Trenae was seen today for medication check, medication refill,  hypertension and lab collection.    Diagnoses and all orders for this visit:    Primary hypertension  Blood pressure is borderline controlled on telmisartan and chlorthalidone.  Has significant muscle spasm/cramps and polyuria.  Will discontinue chlorthalidone and start amlodipine.  Continue telmisartan.  Monitor home blood pressures.  Check electrolytes today.  If she has hypokalemia, this should resolve after stopping chlorthalidone.  -     Comprehensive Metabolic Panel; Future  -     CBC with Auto Differential; Future  -     Magnesium; Future  -     amLODIPine (NORVASC) 5 MG tablet; Take 1 tablet by mouth daily Replaces chlorthalidone 11/30/22  -     telmisartan (MICARDIS) 80 MG tablet; Take 1 tablet by mouth daily    Leg cramp  Likely secondary to electrolyte abnormalities from  chlorthalidone.  Discontinue medication and start amlodipine.  Monitor for now.  Stretching.  -     amLODIPine (NORVASC) 5 MG tablet; Take 1 tablet by mouth daily Replaces chlorthalidone 11/30/22    Endometrial carcinoma (HCC)  This condition appears to be stable and is being evaluated and managed by her specialist Dr. Shelva Majestic..   No acute findings today warrant change in management plan.      Mixed hyperlipidemia  Likely stable.  Not taking statin.  -     Lipid Panel; Future    Contact dermatitis and eczema due to plant  Mild resolving rash on arms and abdomen.  Will recommend refill of steroid cream and prednisone.  -     predniSONE (DELTASONE) 20 MG tablet; Take 3 pills (60 mg) daily for 3 days, then 2 pills (40 mg) daily for 3 days, then 1 pill (20 mg) daily for 3 days  -     triamcinolone (KENALOG) 0.1 % cream; Apply topically 2 times daily.    Urinary incontinence, unspecified type  -     AMB POC URINALYSIS DIP STICK AUTO W/O MICRO  Polyuria.  Likely secondary to chlorthalidone.  Trace leukocytes on urinalysis with not enough urine to culture.  This is not clearly infection at this point.  Return to clinic if worsening.        There are no Patient Instructions on file for this visit.    lab results and schedule of future lab studies reviewed with patient  reviewed medications and side effects in detail    Return to clinic for further evaluation if new symptoms develop    No follow-ups on file.     Current Outpatient Medications   Medication Sig    Multiple Vitamins-Minerals (MULTIVITAMIN WOMEN 50+) TABS Take by mouth daily    latanoprost (XALATAN) 0.005 % ophthalmic solution Place 1 drop into both eyes nightly    predniSONE (DELTASONE) 20 MG tablet Take 3 pills (60 mg) daily for 3 days, then 2 pills (40 mg) daily for 3 days, then 1 pill (20 mg) daily for 3 days    triamcinolone (KENALOG) 0.1 % cream Apply topically 2 times daily.    amLODIPine (NORVASC) 5 MG tablet Take 1 tablet by mouth daily Replaces  chlorthalidone 11/30/22    telmisartan (MICARDIS) 80 MG tablet Take 1 tablet by mouth daily    Cholecalciferol 50 MCG (2000 UT) CAPS Take 1 capsule by mouth daily    Naproxen Sodium 220 MG CAPS Take by mouth as needed     No current facility-administered medications for this visit.

## 2022-12-11 LAB — COMPREHENSIVE METABOLIC PANEL
ALT: 17 U/L (ref 12–78)
AST: 13 U/L — ABNORMAL LOW (ref 15–37)
Albumin/Globulin Ratio: 1.2 (ref 1.1–2.2)
Albumin: 3.7 g/dL (ref 3.5–5.0)
Alk Phosphatase: 100 U/L (ref 45–117)
Anion Gap: 4 mmol/L — ABNORMAL LOW (ref 5–15)
BUN/Creatinine Ratio: 22 — ABNORMAL HIGH (ref 12–20)
BUN: 25 MG/DL — ABNORMAL HIGH (ref 6–20)
CO2: 31 mmol/L (ref 21–32)
Calcium: 9.7 MG/DL (ref 8.5–10.1)
Chloride: 106 mmol/L (ref 97–108)
Creatinine: 1.13 MG/DL — ABNORMAL HIGH (ref 0.55–1.02)
Est, Glom Filt Rate: 48 mL/min/{1.73_m2} — ABNORMAL LOW (ref >60)
Globulin: 3 g/dL (ref 2.0–4.0)
Glucose: 103 mg/dL — ABNORMAL HIGH (ref 65–100)
Potassium: 4 mmol/L (ref 3.5–5.1)
Sodium: 141 mmol/L (ref 136–145)
Total Bilirubin: 0.5 MG/DL (ref 0.2–1.0)
Total Protein: 6.7 g/dL (ref 6.4–8.2)

## 2022-12-11 LAB — CBC WITH AUTO DIFFERENTIAL
Basophils %: 1 % (ref 0–1)
Basophils Absolute: 0 10*3/uL (ref 0.0–0.1)
Eosinophils %: 1 % (ref 0–7)
Eosinophils Absolute: 0.1 10*3/uL (ref 0.0–0.4)
Hematocrit: 41.4 % (ref 35.0–47.0)
Hemoglobin: 13.5 g/dL (ref 11.5–16.0)
Immature Granulocytes %: 0 % (ref 0.0–0.5)
Immature Granulocytes Absolute: 0 10*3/uL (ref 0.00–0.04)
Lymphocytes %: 22 % (ref 12–49)
Lymphocytes Absolute: 1.5 10*3/uL (ref 0.8–3.5)
MCH: 28.7 PG (ref 26.0–34.0)
MCHC: 32.6 g/dL (ref 30.0–36.5)
MCV: 88.1 FL (ref 80.0–99.0)
MPV: 11.5 FL (ref 8.9–12.9)
Monocytes %: 5 % (ref 5–13)
Monocytes Absolute: 0.4 10*3/uL (ref 0.0–1.0)
Neutrophils %: 71 % (ref 32–75)
Neutrophils Absolute: 5 10*3/uL (ref 1.8–8.0)
Nucleated RBCs: 0 PER 100 WBC
Platelets: 208 10*3/uL (ref 150–400)
RBC: 4.7 M/uL (ref 3.80–5.20)
RDW: 12.8 % (ref 11.5–14.5)
WBC: 7.1 10*3/uL (ref 3.6–11.0)
nRBC: 0 10*3/uL (ref 0.00–0.01)

## 2022-12-11 LAB — LIPID PANEL
Chol/HDL Ratio: 3.2 (ref 0.0–5.0)
Cholesterol, Total: 233 MG/DL — ABNORMAL HIGH (ref <200)
HDL: 73 MG/DL
LDL Cholesterol: 139 MG/DL — ABNORMAL HIGH (ref 0–100)
Triglycerides: 105 MG/DL (ref <150)
VLDL Cholesterol Calculated: 21 MG/DL

## 2022-12-11 LAB — MAGNESIUM: Magnesium: 2 mg/dL (ref 1.6–2.4)

## 2022-12-13 NOTE — Other (Signed)
Results reviewed.  Please refer to MyChart comments.

## 2022-12-26 ENCOUNTER — Encounter

## 2022-12-29 ENCOUNTER — Ambulatory Visit (HOSPITAL_COMMUNITY): Payer: 59 | Attending: Physician Assistant

## 2022-12-29 DIAGNOSIS — I5181 Takotsubo syndrome: Secondary | ICD-10-CM | POA: Insufficient documentation

## 2022-12-29 LAB — ECHOCARDIOGRAM COMPLETE
Area-P 1/2: 2.33 cm2
S' Lateral: 1.8 cm

## 2022-12-31 ENCOUNTER — Ambulatory Visit: Payer: 59 | Attending: Internal Medicine | Admitting: Internal Medicine

## 2022-12-31 ENCOUNTER — Encounter: Payer: Self-pay | Admitting: Internal Medicine

## 2022-12-31 VITALS — BP 132/58 | HR 57 | Ht 61.0 in | Wt 126.0 lb

## 2022-12-31 DIAGNOSIS — I421 Obstructive hypertrophic cardiomyopathy: Secondary | ICD-10-CM | POA: Diagnosis not present

## 2022-12-31 DIAGNOSIS — I2510 Atherosclerotic heart disease of native coronary artery without angina pectoris: Secondary | ICD-10-CM

## 2022-12-31 DIAGNOSIS — I10 Essential (primary) hypertension: Secondary | ICD-10-CM

## 2022-12-31 MED ORDER — SODIUM CHLORIDE 0.9% FLUSH
3.0000 mL | Freq: Two times a day (BID) | INTRAVENOUS | Status: AC
Start: 2022-12-31 — End: ?

## 2022-12-31 NOTE — H&P (View-Only) (Signed)
Cardiology Office Note:    Date:  12/31/2022   ID:  [name], DOB 05/30/1941, MRN [id number]  PCP:  Pahwani, Rinka R, MD   [facility] Providers Cardiologist:  Traci Turner, MD     Referring MD: Griffin, [name], MD   CC: oHCM  History of Present Illness:    [name] is a 82 y.o. 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  CMR septal thickness of 16 mm without LGE.  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  2024: had BP issues in 2024 related to AKI on losartan.  LVOT gradient improved.  Patient is feeling ok Patient notes that has long as she is not in pain, she feels great.   Has rare should pain with reaching up and improves when she gets the cortisone shot.  She has rare chest pain that occurs are rest. She has chest pain 2-3 times a week.  Improves with rubbing her chest and being quiet. 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  No change in activity.  Patient notes  SOB at rest and no DOE Notes no fatigue. Notes no palpitations Notes no Dizziness. Notes no syncope.  Notable family events include  Son that comes with her; his echo was good 2.5 years Young son did not have HCM this year.    Past Medical History:  Diagnosis Date   CAD in native artery    a. 04/2016 NSTEMI with cath showing 25% MCx, 75% DX1 lesion and moderate LVD 35-45% out of proportion to CAD and in a pattern of Takotsubo CM (no clear stressor)   Carotid stenosis    Carotid Doppler showed 40 to 59% stenosis of the right carotid artery and less than 50% stenosis of the right common carotid artery.  There is 1 to 39% stenosis of the left internal carotid artery and greater than 50% stenosis of the common carotid artery on the left.   There is also left subclavian artery stenosis. by dopplers 05/2021   Dyslipidemia    GERD (gastroesophageal reflux disease)    HOCM (hypertrophic obstructive cardiomyopathy) (HCC)    Hypercholesteremia    Hypertension    Hypertensive heart disease    initially she was thought to have a HOCM variant by cath with increased LVOT gradient but echo showed only a 10mmHg gradient across the LVOT and SAM and cardiac MRI did not show any late gad enhancement and only a 5mmHg LVOT gradient with 15mm BSH.  There was some mild SAM and mild MR.  EF normal at 76%.     LVH (left ventricular hypertrophy)    a. cMRI 2018 - showing moderate Basal septal hypertrophy not classic for HOCM morphology with no delayed gadolium uptake to suggest myofibrillar disarray, + SAM with small LVOT gradient in regard to physiology and mild MR.   Mild aortic stenosis    NSTEMI (non-ST elevated myocardial infarction) (HCC) 04/2016   secondary to stress CM - Takotsubo CM   PAC (premature atrial contraction)    Event monitors 10/2013 and 08/2014 showed NSR, ST, PACs, PVCs.    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   CARDIAC CATHETERIZATION N/A 04/21/2016   Procedure: Left Heart Cath and Coronary Angiography;  Surgeon: Jayadeep S Varanasi, MD;  Location: [facility];  Service: Cardiovascular;  Laterality: N/A;   IR GENERIC HISTORICAL  02/13/2016   IR RADIOLOGY PERIPHERAL GUIDED IV START 02/13/2016 Michael Shick, MD [facility]   IR GENERIC HISTORICAL  02/13/2016   IR US GUIDE VASC  ACCESS LEFT 02/13/2016 Michael Shick, MD [facility]    Current Medications: Current Meds  Medication Sig   acetaminophen (TYLENOL) 325 MG tablet Take 2 tablets (650 mg total) by mouth every 4 (four) hours as needed for headache or mild pain.   alendronate (FOSAMAX) 70 MG tablet Take 70 mg by mouth once a week. Monday   aspirin EC 81 MG EC tablet Take 1 tablet (81 mg total) by mouth daily.   atorvastatin (LIPITOR) 80 MG tablet TAKE 1 TABLET BY MOUTH EVERY DAY   Calcium Carbonate-Vitamin D (CALCIUM-D PO) Take 1 tablet by mouth daily.   carvedilol (COREG) 12.5 MG tablet TAKE 1 TABLET BY MOUTH 2 TIMES DAILY.   Cholecalciferol (VITAMIN D) 125 MCG (5000 UT) CAPS Take 5,000 Units by mouth daily.   ezetimibe (ZETIA) 10 MG tablet TAKE 1 TABLET BY MOUTH EVERY DAY   ferrous sulfate 325 (65 FE) MG EC tablet Take 325 mg by mouth 3 (three) times a week.   fluticasone (FLONASE) 50 MCG/ACT nasal spray Place 1 spray into both nostrils daily as needed (congestion).    fluticasone (FLOVENT HFA) 110 MCG/ACT inhaler Inhale 1 puff into the lungs as needed.    gabapentin (NEURONTIN) 300 MG capsule Take 600 mg by mouth 3 (three) times daily.   losartan (COZAAR) 25 MG tablet Take 1 tablet (25 mg total) by mouth daily.   Multiple Vitamin (MULTIVITAMIN WITH MINERALS) TABS tablet Take 1 tablet by mouth daily.   Omega-3 Fatty Acids (FISH OIL PO) Take 1 capsule by mouth daily at 2 PM.    omeprazole (PRILOSEC OTC) 20 MG tablet Take 20 mg by mouth daily as needed (for heartburn).   spironolactone (ALDACTONE) 50 MG tablet Take 1 tablet (50 mg total) by mouth daily.   Current Facility-Administered Medications for the 12/31/22 encounter (Office Visit) with ,  A, MD  Medication   sodium chloride flush (NS) 0.9 % injection 3 mL     Allergies:   Other, Ace inhibitors, Codeine, and Penicillins   Social History   Socioeconomic History   Marital status: Single    Spouse name: Not on file   Number of  children: Not on file   Years of education: Not on file   Highest education level: Not on file  Occupational History   Not on file  Tobacco Use   Smoking status: Former    Types: Cigarettes    Quit date: 07/13/2004    Years since quitting: 18.4   Smokeless tobacco: Never  Vaping Use   Vaping Use: Never used  Substance and Sexual Activity   Alcohol use: Yes    Comment: ocassionally    Drug use: No   Sexual activity: Not   on file  Other Topics Concern   Not on file  Social History Narrative   Not on file   Social Determinants of Health   Financial Resource Strain: Not on file  Food Insecurity: Not on file  Transportation Needs: Not on file  Physical Activity: Not on file  Stress: Not on file  Social Connections: Not on file     Family History: The patient's family history includes Heart attack in her brother, mother, and sister; Heart disease in her brother, mother, and sister.  ROS:   Please see the history of present illness.     EKGs/Labs/Other Studies Reviewed:    The following studies were reviewed today:  Cardiac Studies & Procedures   CARDIAC CATHETERIZATION  CARDIAC CATHETERIZATION 04/21/2016  Narrative  Mid Cx lesion, 25 %stenosed.  Ost 1st Diag lesion, 75 %stenosed.  The left ventricular ejection fraction is 35-45% by visual estimate.  There is mild to moderate left ventricular systolic dysfunction in a pattern of Takotsubo cardiomyopathy.  LV end diastolic pressure is mildly elevated.  50-55 mm Hg resting gradient across the LVOT, likely due to subvalvular stenosis. No difficulty crossing the aortic valve.  Severe tortuosity of the right subclavian making torquing catheters difficult. Consider left radial if cath needed in the future.  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  ACE-I in AM if renal function stable.  No clear source of stress that may have caused this episode.  Findings Coronary Findings Diagnostic   Dominance: Left  Left Anterior Descending  First Diagonal Branch  Left Circumflex  Intervention  No interventions have been documented.   STRESS TESTS  MYOCARDIAL PERFUSION IMAGING 11/24/2019  Narrative  Nuclear stress EF: 63%.  There was no ST segment deviation noted during stress.  The study is normal.  This is a low risk study.  The left ventricular ejection fraction is normal (55-65%).   ECHOCARDIOGRAM  ECHOCARDIOGRAM COMPLETE 12/29/2022  Narrative ECHOCARDIOGRAM REPORT    Patient Name:   Mykaylah L Bouie Date of Exam: 12/29/2022 Medical Rec #:  [id number]       Height:       62.0 in Accession #:    [id number]      Weight:       126.6 lb Date of Birth:  05/14/1941       BSA:          1.574 m Patient Age:    82 years        BP:           152/60 mmHg Patient Gender: F               HR:           56 bpm. Exam Location:  [address]  Procedure: 2D Echo, Cardiac Doppler and Color Doppler  Indications:    I51.81 Takotsubo cardiomyopathy  History:        Patient has prior history of Echocardiogram examinations, most recent 08/05/2022. Takotsubo cardiomyopathy, SAM, HOCM, Arrythmias:PVC and PAC; Risk Factors:Hypertension and Dyslipidemia.  Sonographer:    William Edwards RDCS Referring Phys: [id number] TESSA N CONTE  IMPRESSIONS   1. Left ventricular ejection fraction, by estimation, is 65 to 70%. The left ventricle has normal function. The left ventricle has no regional wall motion abnormalities. There is severe asymmetric left ventricular hypertrophy of the basal-septal segment. Left ventricular diastolic parameters are consistent with Grade I diastolic dysfunction (impaired relaxation). LVOT peak gradient 9mmHg at rest, 13mmHg with Valsalva 2. Right ventricular   systolic function is normal. The right ventricular size is normal. There is normal pulmonary artery systolic pressure. The estimated right ventricular systolic pressure is 29.6 mmHg. 3. The mitral valve is  normal in structure. Trivial mitral valve regurgitation. No evidence of mitral stenosis. 4. The aortic valve is tricuspid. Aortic valve regurgitation is not visualized. No aortic stenosis is present. 5. The inferior vena cava is normal in size with greater than 50% respiratory variability, suggesting right atrial pressure of 3 mmHg.  FINDINGS Left Ventricle: Left ventricular ejection fraction, by estimation, is 65 to 70%. The left ventricle has normal function. The left ventricle has no regional wall motion abnormalities. The left ventricular internal cavity size was normal in size. There is severe asymmetric left ventricular hypertrophy of the basal-septal segment. Left ventricular diastolic parameters are consistent with Grade I diastolic dysfunction (impaired relaxation).  Right Ventricle: The right ventricular size is normal. No increase in right ventricular wall thickness. Right ventricular systolic function is normal. There is normal pulmonary artery systolic pressure. The tricuspid regurgitant velocity is 2.58 m/s, and with an assumed right atrial pressure of 3 mmHg, the estimated right ventricular systolic pressure is 29.6 mmHg.  Left Atrium: Left atrial size was normal in size.  Right Atrium: Right atrial size was normal in size.  Pericardium: There is no evidence of pericardial effusion.  Mitral Valve: The mitral valve is normal in structure. Trivial mitral valve regurgitation. No evidence of mitral valve stenosis.  Tricuspid Valve: The tricuspid valve is normal in structure. Tricuspid valve regurgitation is trivial.  Aortic Valve: The aortic valve is tricuspid. Aortic valve regurgitation is not visualized. No aortic stenosis is present.  Pulmonic Valve: The pulmonic valve was not well visualized. Pulmonic valve regurgitation is not visualized.  Aorta: The aortic root and ascending aorta are structurally normal, with no evidence of dilitation.  Venous: The inferior vena cava is  normal in size with greater than 50% respiratory variability, suggesting right atrial pressure of 3 mmHg.  IAS/Shunts: The interatrial septum was not well visualized.   LEFT VENTRICLE PLAX 2D LVIDd:         2.90 cm   Diastology LVIDs:         1.80 cm   LV e' medial:    5.11 cm/s LV PW:         1.00 cm   LV E/e' medial:  15.9 LV IVS:        1.60 cm   LV e' lateral:   7.62 cm/s LVOT diam:     1.60 cm   LV E/e' lateral: 10.7 LVOT Area:     2.01 cm   RIGHT VENTRICLE             IVC RV S prime:     16.60 cm/s  IVC diam: 1.50 cm TAPSE (M-mode): 1.8 cm RVSP:           29.6 mmHg  LEFT ATRIUM             Index        RIGHT ATRIUM           Index LA diam:        4.40 cm 2.80 cm/m   RA Pressure: 3.00 mmHg LA Vol (A2C):   41.2 ml 26.17 ml/m  RA Area:     9.62 cm LA Vol (A4C):   56.5 ml 35.90 ml/m  RA Volume:   16.30 ml  10.36 ml/m LA Biplane Vol: 50.5 ml 32.08 ml/m  AORTA   Ao Root diam: 2.80 cm Ao Asc diam:  2.90 cm  MITRAL VALVE                TRICUSPID VALVE MV Area (PHT): 2.33 cm     TR Peak grad:   26.6 mmHg MV Decel Time: 326 msec     TR Vmax:        258.00 cm/s MV E velocity: 81.20 cm/s   Estimated RAP:  3.00 mmHg MV A velocity: 122.00 cm/s  RVSP:           29.6 mmHg MV E/A ratio:  0.67 SHUNTS Systemic Diam: 1.60 cm  Christopher Schumann MD Electronically signed by Christopher Schumann MD Signature Date/Time: 12/29/2022/4:23:47 PM    Final    MONITORS  CARDIAC EVENT MONITOR 03/27/2019  Narrative  Sinus bradycardia, normal sinus rhythm and sinus tachycardia with average heart rate 61bpm.  Occasional PVCs with PVC load 1%.   CT SCANS  CT CORONARY MORPH W/CTA COR W/SCORE 02/13/2016  Addendum 02/13/2016  5:40 PM ADDENDUM REPORT: 02/13/2016 17:38  CLINICAL DATA:  Chest pain  EXAM: Cardiac CTA  MEDICATIONS: Sub lingual nitro. 4mg and lopressor 10mg  TECHNIQUE: Patient was a difficult iv cannulation. Dr Trevor Schick eventually used micro-puncture  technique in IR radiology to start iv  The patient was scanned on a Philips 256 slice scanner. Gantry rotation speed was 270 msecs. Collimation was .9mm. A 100 kV prospective scan was triggered in the descending thoracic aorta at 111 HU's with 5% padding centered around 78% of the R-R interval. Average HR during the scan was 72 bpm. The 3D data set was interpreted on a dedicated work station using MPR, MIP and VRT modes. A total of 80cc of contrast was used.  FINDINGS: Non-cardiac: See separate report from [facility]. No significant findings on limited lung and soft tissue windows.  Calcium Score:  2 with isolated area of calcification in LM  Coronary Arteries: Right dominant with no anomalies  LM:  Small area of non obstructive calcium  LAD:  Normal tortuous  D1:  Normal large and branching  Circumflex:  Left dominant normal  OM1: Normal  OM2: Normal  RCA:  Small and non dominant normal  PDA:  Left sided normal  PLA:  Left sided normal  IMPRESSION: 1) Poor quality study due to motion artifact and somewhat poor filling of coronary arteries with tortuosity  2) Calcium score 2 isolated foci in LM 23rd percentile for age and sex matched controls  3) Normal left dominant coronary arteries with noted motion artifact and poor distal vessel visualization  Peter Nishan   Electronically Signed By: Peter  Nishan M.D. On: 02/13/2016 17:38  Narrative EXAM: OVER-READ INTERPRETATION  CT CHEST  The following report is an over-read performed by radiologist Dr. Daniel Entrikinof [facility] on 02/13/2016. This over-read does not include interpretation of cardiac or coronary anatomy or pathology. The coronary calcium score/coronary CTA interpretation by the cardiologist is attached.  COMPARISON:  None.  FINDINGS: Aortic atherosclerosis. Within the visualized portions of the thorax there are no suspicious appearing pulmonary nodules or masses,  there is no acute consolidative airspace disease, no pleural effusions, no pneumothorax and no lymphadenopathy. Visualized portions of the upper abdomen again demonstrate aortic atherosclerosis. High density material in the collecting systems of the kidneys bilaterally, presumably contrast material related to test bolus injection. There are no aggressive appearing lytic or blastic lesions noted in the visualized portions of the skeleton.  IMPRESSION: 1. Aortic atherosclerosis. 2.   No acute incidental noncardiac findings noted.  Electronically Signed: By: Daniel  Entrikin M.D. On: 02/13/2016 16:32   CARDIAC MRI  MR CARDIAC MORPHOLOGY W WO CONTRAST 05/13/2022  Narrative CLINICAL DATA:  Clinical question of hypertrophic cardiomyopathy Study assumes BSA of 1.63 m2.  EXAM: CARDIAC MRI  TECHNIQUE: The patient was scanned on a 1.5 Tesla GE magnet. A dedicated cardiac coil was used. Functional imaging was done using Fiesta sequences. 2,3, and 4 chamber views were done to assess for RWMA's. Modified Simpson's rule using a short axis stack was used to calculate an ejection fraction on a dedicated work station using Circle software. The patient received 7 cc of Gadavist. After 10 minutes inversion recovery sequences were used to assess for infiltration and scar tissue. Velocity encoding sequences performed for valve assessment.  CONTRAST:  7 cc  of Gadavist  FINDINGS: 1. Normal left ventricular size, with LVEDD 35 mm, and LVEDVi 58 mL/m2.  Severe asymmetric septal hypertrophy, with intraventricular septal thickness of 17 mm, posterior wall thickness of 6 mm, and myocardial mass index of 49 g/m2.  Normal left ventricular systolic function (LVEF =67%). There is systolic anterior motion of the anterior mitral valve leaflet.  Left ventricular parametric mapping notable for normal T2 and ECV signal.  There is no late gadolinium enhancement in the left ventricular myocardium. Six  standard deviation assessment used.  2. Normal right ventricular size with RVEDVI 50 mL/m2.  Normal right ventricular thickness.  Normal right ventricular systolic function (RVEF =55%). There are no regional wall motion abnormalities or aneurysms.  3.  Moderate left atrial enlargement and normal right atrial size.  4. Normal size of the aortic root, ascending aorta and pulmonary artery.  5. Valve assessment:  Aortic Valve: Tri-leaflet aortic valve. No significant regurgitation. Regurgitant fraction 2 %.  LVOT: Aliasing artifact. Peak LVOT Velocity 3.16 m/s at best estimate. LVOT gradient is above 16 mm Hg.  Pulmonic Valve: No significant regurgitation. Regurgitant fraction 3 %.  Tricuspid Valve: Mild tricuspid regurgitation. Regurgitant fraction 17 %.  Mitral Valve: Moderate mitral regurgitation from SAM. Regurgitant fraction 27 %.  6. Normal pericardium. Small pericardial effusion anterior to the RV base and posterior to the LV base with no evidence if increased pericardial pressure.  7. Grossly, no extracardiac findings. Recommended dedicated study if concerned for non-cardiac pathology.  IMPRESSION: Consistent with hypertrophic cardiomyopathy.  Maximal septal thickness 17 mm.  No LGE.  No Aneurysm.  LVEF 67%.  LVOT obstruction noted.  Moderate mitral regurgitation.  Compared to prior slight increase in hypertrophy with increase in MR and LAE, but with no LGE.    MD   Electronically Signed By:     M.D. On: 05/13/2022 13:33          Recent Labs: 08/05/2022: Hemoglobin 11.6; Platelets 141 08/19/2022: BUN 10; Creatinine, Ser 1.00; Potassium 4.6; Sodium 139  Recent Lipid Panel    Component Value Date/Time   CHOL 154 09/16/2020 1153   TRIG 60 09/16/2020 1153   HDL 55 09/16/2020 1153   CHOLHDL 2.8 09/16/2020 1153   CHOLHDL 3.9 04/22/2016 0017   VLDL 52 (H) 04/22/2016 0017   LDLCALC 87 09/16/2020 1153        Physical Exam:    VS:  BP (!) 132/58   Pulse (!) 57   Ht 5' 1" (1.549 m)   Wt 126 lb (57.2 kg)   SpO2 98%   BMI 23.81 kg/m     Wt Readings from Last 3 Encounters:  12/31/22 126 lb (  57.2 kg)  08/19/22 126 lb 9.6 oz (57.4 kg)  08/04/22 135 lb (61.2 kg)    GEN: Elderly female in no acute distress HEENT: Normal NECK: No JVD CARDIAC: RRR, Systolic murmur at rest more prominent with Valsalva, rubs, gallops RESPIRATORY:  Clear to auscultation without rales, wheezing or rhonchi  ABDOMEN: Soft, non-tender, non-distended MUSCULOSKELETAL:  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 HOCM (hypertrophic obstructive cardiomyopathy) (HCC)   2. Coronary artery disease involving native coronary artery of native heart without angina pectoris   3. Essential hypertension, benign     PLAN:    Hypertrophic Cardiomyopathy favored over Hypertensive heart disease - septal Variant  - peak gradient 17 on Valvalva earlier this week - with SAM MR that has likely persistent when not supine - suspicion of Fabry's/Danon or other mimics of HCM: HTN has been elevated - Gene variant: NA  - NYHA II-III - I suspect her symptoms of chest pain either come from obstructive ostial LAD disease or due to inducible gradient HCM - BP is controlled; we are have ben unable to titrate of ARB due to re-fractory BP; she would not tolerate Imdur - Would recommend LHC; if able to intervene on diagonal would recommended this for angina; if not would ask operator to get an invasive LVOT - if PCI; will see Tessa in follow - if gradient about 30 mm Hg will come back to see me for CMI discussion - if not will plan for norpace start and EKG f/u after  Family history reviewed, Discussed family screening  (2 sons negative echo screening)   SCD  Assessment SDM: we have discussed lower risk of SCD; no plans for f/u suveillance for this at  this time; she is 82 years young  Atrial fibrillation (no evidence of AF)  Risks and benefits of cardiac catheterization have been discussed with the patient.  These include bleeding, infection, kidney damage, stroke, heart attack, death.  The patient understands these risks and is willing to proceed.  Access recommendations: R radial Procedural considerations has limited options for medical management; would intervene if feasible   CAD: Continue ASA, statin, zetia   Time Spent Directly with Patient:   I have spent a total of 50 minutes with the patient reviewing notes, imaging, EKGs, labs and examining the patient as well as establishing an assessment and plan that was discussed personally with the patient.  > 50% of time was spent in direct patient care and family and reviewing imaging with patient.        Medication Adjustments/Labs and Tests Ordered: Current medicines are reviewed at length with the patient today.  Concerns regarding medicines are outlined above.  Orders Placed This Encounter  Procedures   Basic metabolic panel   CBC   EKG 12-Lead   Meds ordered this encounter  Medications   sodium chloride flush (NS) 0.9 % injection 3 mL    Patient Instructions  Medication Instructions:  Your physician recommends that you continue on your current medications as directed. Please refer to the Current Medication list given to you today.  *If you need a refill on your cardiac medications before your next appointment, please call your pharmacy*   Lab Work: CBC, BMP  If you have labs (blood work) drawn today and your tests are completely normal, you will receive your results only by: MyChart Message (if you have MyChart) OR   A paper copy in the mail If you have any lab test that is abnormal or we need to change your treatment, we will call you to review the results.   Testing/Procedures: Your physician has requested that you have a cardiac catheterization. Cardiac  catheterization is used to diagnose and/or treat various heart conditions. Doctors may recommend this procedure for a number of different reasons. The most common reason is to evaluate chest pain. Chest pain can be a symptom of coronary artery disease (CAD), and cardiac catheterization can show whether plaque is narrowing or blocking your heart's arteries. This procedure is also used to evaluate the valves, as well as measure the blood flow and oxygen levels in different parts of your heart. For further information please visit [email or web address]. Please follow instruction sheet, as given.    Follow-Up:to be determined At [facility], you and your health needs are our priority.  As part of our continuing mission to provide you with exceptional heart care, we have created designated Provider Care Teams.  These Care Teams include your primary Cardiologist (physician) and Advanced Practice Providers (APPs -  Physician Assistants and Nurse Practitioners) who all work together to provide you with the care you need, when you need it.  We recommend signing up for the patient portal called "MyChart".  Sign up information is provided on this After Visit Summary.  MyChart is used to connect with patients for Virtual Visits (Telemedicine).  Patients are able to view lab/test results, encounter notes, upcoming appointments, etc.  Non-urgent messages can be sent to your provider as well.   To learn more about what you can do with MyChart, go to [email or web address].     Provider:   Tessa Conte, PA-C       Other Instructions   You are scheduled for a Cardiac Catheterization on Wednesday, June 26 with Dr. Christopher McAlhany.  1. Please arrive at the [facility] (Main Entrance A) at [facility]: [address] [place], [place] [address] at 6:30 AM (This time is 2 hour(s) before your procedure to ensure your preparation). Free valet parking service is available. You will check in at  ADMITTING. The support person will be asked to wait in the waiting room.  It is OK to have someone drop you off and come back when you are ready to be discharged.    Special note: Every effort is made to have your procedure done on time. Please understand that emergencies sometimes delay scheduled procedures.  2. Diet: Do not eat solid foods after midnight.  The patient may have clear liquids until 5am upon the day of the procedure.  3. Labs: You will need to have blood drawn TODAY.  4. Medication instructions in preparation for your procedure:   Contrast Allergy: No  Do Not take Spironolactone the day of Heart Cath.    On the morning of your procedure, take your Aspirin 81 mg and any morning medicines NOT listed above.  You may use sips of water.  5. Plan to go home the same day, you will only stay overnight if medically necessary. 6. Bring a current list of your medications and current insurance cards. 7. You MUST have a responsible person to drive you home. 8. Someone MUST be with you the first 24 hours after you arrive home or your discharge will be delayed. 9. Please wear clothes that are easy to get on and off and wear slip-on shoes.  Thank you for allowing us to care for   you!   -- [facility]     Signed,  A , MD  12/31/2022 3:20 PM    [facility] 

## 2022-12-31 NOTE — Patient Instructions (Signed)
Medication Instructions:  Your physician recommends that you continue on your current medications as directed. Please refer to the Current Medication list given to you today.  *If you need a refill on your cardiac medications before your next appointment, please call your pharmacy*   Lab Work: CBC, BMP  If you have labs (blood work) drawn today and your tests are completely normal, you will receive your results only by: MyChart Message (if you have MyChart) OR A paper copy in the mail If you have any lab test that is abnormal or we need to change your treatment, we will call you to review the results.   Testing/Procedures: Your physician has requested that you have a cardiac catheterization. Cardiac catheterization is used to diagnose and/or treat various heart conditions. Doctors may recommend this procedure for a number of different reasons. The most common reason is to evaluate chest pain. Chest pain can be a symptom of coronary artery disease (CAD), and cardiac catheterization can show whether plaque is narrowing or blocking your heart's arteries. This procedure is also used to evaluate the valves, as well as measure the blood flow and oxygen levels in different parts of your heart. For further information please visit [email or web address] Please follow instruction sheet, as given.    Follow-Up:to be determined At [facility], you and your health needs are our priority.  As part of our continuing mission to provide you with exceptional heart care, we have created designated Provider Care Teams.  These Care Teams include your primary Cardiologist (physician) and Advanced Practice Providers (APPs -  Physician Assistants and Nurse Practitioners) who all work together to provide you with the care you need, when you need it.  We recommend signing up for the patient portal called "MyChart".  Sign up information is provided on this After Visit Summary.  MyChart is used to connect with  patients for Virtual Visits (Telemedicine).  Patients are able to view lab/test results, encounter notes, upcoming appointments, etc.  Non-urgent messages can be sent to your provider as well.   To learn more about what you can do with MyChart, go to [email or web address].     Provider:   Jari Favre, PA-C       Other Instructions   You are scheduled for a Cardiac Catheterization on Wednesday, June 26 with Dr. [name].  1. Please arrive at the [facility] (Main Entrance A) at [facility]: [address] [place], [place] [address] at 6:30 AM (This time is 2 hour(s) before your procedure to ensure your preparation). Free valet parking service is available. You will check in at ADMITTING. The support person will be asked to wait in the waiting room.  It is OK to have someone drop you off and come back when you are ready to be discharged.    Special note: Every effort is made to have your procedure done on time. Please understand that emergencies sometimes delay scheduled procedures.  2. Diet: Do not eat solid foods after midnight.  The patient may have clear liquids until 5am upon the day of the procedure.  3. Labs: You will need to have blood drawn TODAY.  4. Medication instructions in preparation for your procedure:   Contrast Allergy: No  Do Not take Spironolactone the day of Heart Cath.    On the morning of your procedure, take your Aspirin 81 mg and any morning medicines NOT listed above.  You may use sips of water.  5. Plan to go home the  same day, you will only stay overnight if medically necessary. 6. Bring a current list of your medications and current insurance cards. 7. You MUST have a responsible person to drive you home. 8. Someone MUST be with you the first 24 hours after you arrive home or your discharge will be delayed. 9. Please wear clothes that are easy to get on and off and wear slip-on shoes.  Thank you for allowing [place] to care for you!   --  [facility]

## 2022-12-31 NOTE — Progress Notes (Signed)
Cardiology Office Note:    Date:  12/31/2022   ID:  [name], DOB 09/28/1940, MRN [id number]  PCP:  Ollen Bowl, MD   [facility] Providers Cardiologist:  Armanda Magic, MD     Referring MD: Maurice Small, MD   CC: [facility]  History of Present Illness:    [name] is a 82 y.o. 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  CMR septal thickness of 16 mm without LGE.  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  2024: had BP issues in 2024 related to AKI on losartan.  LVOT gradient improved.  Patient is feeling ok Patient notes that has long as she is not in pain, she feels great.   Has rare should pain with reaching up and improves when she gets the cortisone shot.  She has rare chest pain that occurs are rest. She has chest pain 2-3 times a week.  Improves with rubbing her chest and being quiet. 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  No change in activity.  Patient notes  SOB at rest and no DOE Notes no fatigue. Notes no palpitations Notes no Dizziness. Notes no syncope.  Notable family events include  Son that comes with her; his echo was good 2.5 years Young son did not have HCM this year.    Past Medical History:  Diagnosis Date   CAD in native artery    a. 04/2016 NSTEMI with cath showing 25% MCx, 75% DX1 lesion and moderate LVD 35-45% out of proportion to CAD and in a pattern of Takotsubo CM (no clear stressor)   Carotid stenosis    Carotid Doppler showed 40 to 59% stenosis of the right carotid artery and less than 50% stenosis of the right common carotid artery.  There is 1 to 39% stenosis of the left internal carotid artery and greater than 50% stenosis of the common carotid artery on the left.   There is also left subclavian artery stenosis. by dopplers 05/2021   Dyslipidemia    GERD (gastroesophageal reflux disease)    HOCM (hypertrophic obstructive cardiomyopathy) (HCC)    Hypercholesteremia    Hypertension    Hypertensive heart disease    initially she was thought to have a HOCM variant by cath with increased LVOT gradient but echo showed only a gradient across the LVOT and SAM and cardiac MRI did not show any late gad enhancement and only a LVOT gradient with 15mm BSH.  There was some mild SAM and mild MR.  EF normal at 76%.     LVH (left ventricular hypertrophy)    a. cMRI 2018 - showing moderate Basal septal hypertrophy not classic for HOCM morphology with no delayed gadolium uptake to suggest myofibrillar disarray, + SAM with small LVOT gradient in regard to physiology and mild MR.   Mild aortic stenosis    NSTEMI (non-ST elevated myocardial infarction) (HCC) 04/2016   secondary to stress CM - Takotsubo CM   PAC (premature atrial contraction)    Event monitors 10/2013 and 08/2014 showed NSR, ST, PACs, PVCs.    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   CARDIAC CATHETERIZATION N/A 04/21/2016   Procedure: Left Heart Cath and Coronary Angiography;  Surgeon: Corky Crafts, MD;  Location: [facility] INVASIVE CV LAB;  Service: Cardiovascular;  Laterality: N/A;   IR GENERIC HISTORICAL  02/13/2016   IR RADIOLOGY PERIPHERAL GUIDED IV START 02/13/2016 Berdine Dance, MD [facility]INTER[facility] RAD   IR GENERIC HISTORICAL  02/13/2016   IR US GUIDE VASC  ACCESS LEFT 02/13/2016 Berdine Dance, MD [facility]    Current Medications: Current Meds  Medication Sig   acetaminophen (TYLENOL) 325 MG tablet Take 2 tablets (650 mg total) by mouth every 4 (four) hours as needed for headache or mild pain.   alendronate (FOSAMAX) 70 MG tablet Take 70 mg by mouth once a week. Monday   aspirin EC 81 MG EC tablet Take 1 tablet (81 mg total) by mouth daily.   atorvastatin (LIPITOR) 80 MG tablet TAKE 1 TABLET BY MOUTH EVERY DAY   Calcium Carbonate-Vitamin D (CALCIUM-D PO) Take 1 tablet by mouth daily.   carvedilol (COREG) 12.5 MG tablet TAKE 1 TABLET BY MOUTH 2 TIMES DAILY.   Cholecalciferol (VITAMIN D) 125 MCG (5000 UT) CAPS Take 5,000 Units by mouth daily.   ezetimibe (ZETIA) 10 MG tablet TAKE 1 TABLET BY MOUTH EVERY DAY   ferrous sulfate 325 (65 FE) MG EC tablet Take 325 mg by mouth 3 (three) times a week.   fluticasone (FLONASE) 50 MCG/ACT nasal spray Place 1 spray into both nostrils daily as needed (congestion).    fluticasone (FLOVENT HFA) 110 MCG/ACT inhaler Inhale 1 puff into the lungs as needed.    gabapentin (NEURONTIN) 300 MG capsule Take 600 mg by mouth 3 (three) times daily.   losartan (COZAAR) 25 MG tablet Take 1 tablet (25 mg total) by mouth daily.   Multiple Vitamin (MULTIVITAMIN WITH MINERALS) TABS tablet Take 1 tablet by mouth daily.   Omega-3 Fatty Acids (FISH OIL PO) Take 1 capsule by mouth daily at 2 PM.    omeprazole (PRILOSEC OTC) 20 MG tablet Take 20 mg by mouth daily as needed (for heartburn).   spironolactone (ALDACTONE) 50 MG tablet Take 1 tablet (50 mg total) by mouth daily.   Current Facility-Administered Medications for the 12/31/22 encounter (Office Visit) with Christell Constant, MD  Medication   sodium chloride flush (NS) 0.9 % injection 3 mL     Allergies:   Other, Ace inhibitors, Codeine, and Penicillins   Social History   Socioeconomic History   Marital status: Single    Spouse name: Not on file   Number of  children: Not on file   Years of education: Not on file   Highest education level: Not on file  Occupational History   Not on file  Tobacco Use   Smoking status: Former    Types: Cigarettes    Quit date: 07/13/2004    Years since quitting: 18.4   Smokeless tobacco: Never  Vaping Use   Vaping Use: Never used  Substance and Sexual Activity   Alcohol use: Yes    Comment: ocassionally    Drug use: No   Sexual activity: Not  on file  Other Topics Concern   Not on file  Social History Narrative   Not on file   Social Determinants of Health   Financial Resource Strain: Not on file  Food Insecurity: Not on file  Transportation Needs: Not on file  Physical Activity: Not on file  Stress: Not on file  Social Connections: Not on file     Family History: The patient's family history includes Heart attack in her brother, mother, and sister; Heart disease in her brother, mother, and sister.  ROS:   Please see the history of present illness.     EKGs/Labs/Other Studies Reviewed:    The following studies were reviewed today:  Cardiac Studies & Procedures   CARDIAC CATHETERIZATION  CARDIAC CATHETERIZATION 04/21/2016  Narrative  Mid Cx lesion, 25 %stenosed.  Ost 1st Diag lesion, 75 %stenosed.  The left ventricular ejection fraction is 35-45% by visual estimate.  There is mild to moderate left ventricular systolic dysfunction in a pattern of Takotsubo cardiomyopathy.  LV end diastolic pressure is mildly elevated.  50-55 mm Hg resting gradient across the LVOT, likely due to subvalvular stenosis. No difficulty crossing the aortic valve.  Severe tortuosity of the right subclavian making torquing catheters difficult. Consider left radial if cath needed in the future.  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  ACE-I in AM if renal function stable.  No clear source of stress that may have caused this episode.  Findings Coronary Findings Diagnostic   Dominance: Left  Left Anterior Descending  First Diagonal Branch  Left Circumflex  Intervention  No interventions have been documented.   STRESS TESTS  MYOCARDIAL PERFUSION IMAGING 11/24/2019  Narrative  Nuclear stress EF: 63%.  There was no ST segment deviation noted during stress.  The study is normal.  This is a low risk study.  The left ventricular ejection fraction is normal (55-65%).   ECHOCARDIOGRAM  ECHOCARDIOGRAM COMPLETE 12/29/2022  Narrative ECHOCARDIOGRAM REPORT    Patient Name:   [name] Date of Exam: 12/29/2022 Medical Rec #:  [id number]       Height:       62.0 in Accession #:    [id number]      Weight:       126.6 lb Date of Birth:  1940/12/22       BSA:          1.574 m Patient Age:    82 years        BP:           152/60 mmHg Patient Gender: F               HR:           56 bpm. Exam Location:  [address]  Procedure: 2D Echo, Cardiac Doppler and Color Doppler  Indications:    I51.81 Takotsubo cardiomyopathy  History:        Patient has prior history of Echocardiogram examinations, most recent 08/05/2022. Takotsubo cardiomyopathy, SAM, HOCM, Arrythmias:PVC and PAC; Risk Factors:Hypertension and Dyslipidemia.  Sonographer:    Samule Ohm RDCS Referring Phys: [id number] TESSA N CONTE  IMPRESSIONS   1. Left ventricular ejection fraction, by estimation, is 65 to 70%. The left ventricle has normal function. The left ventricle has no regional wall motion abnormalities. There is severe asymmetric left ventricular hypertrophy of the basal-septal segment. Left ventricular diastolic parameters are consistent with Grade I diastolic dysfunction (impaired relaxation). LVOT peak gradient at rest, with Valsalva 2. Right ventricular  systolic function is normal. The right ventricular size is normal. There is normal pulmonary artery systolic pressure. The estimated right ventricular systolic pressure is 29.6 mmHg. 3. The mitral valve is  normal in structure. Trivial mitral valve regurgitation. No evidence of mitral stenosis. 4. The aortic valve is tricuspid. Aortic valve regurgitation is not visualized. No aortic stenosis is present. 5. The inferior vena cava is normal in size with greater than 50% respiratory variability, suggesting right atrial pressure of 3 mmHg.  FINDINGS Left Ventricle: Left ventricular ejection fraction, by estimation, is 65 to 70%. The left ventricle has normal function. The left ventricle has no regional wall motion abnormalities. The left ventricular internal cavity size was normal in size. There is severe asymmetric left ventricular hypertrophy of the basal-septal segment. Left ventricular diastolic parameters are consistent with Grade I diastolic dysfunction (impaired relaxation).  Right Ventricle: The right ventricular size is normal. No increase in right ventricular wall thickness. Right ventricular systolic function is normal. There is normal pulmonary artery systolic pressure. The tricuspid regurgitant velocity is 2.58 m/s, and with an assumed right atrial pressure of 3 mmHg, the estimated right ventricular systolic pressure is 29.6 mmHg.  Left Atrium: Left atrial size was normal in size.  Right Atrium: Right atrial size was normal in size.  Pericardium: There is no evidence of pericardial effusion.  Mitral Valve: The mitral valve is normal in structure. Trivial mitral valve regurgitation. No evidence of mitral valve stenosis.  Tricuspid Valve: The tricuspid valve is normal in structure. Tricuspid valve regurgitation is trivial.  Aortic Valve: The aortic valve is tricuspid. Aortic valve regurgitation is not visualized. No aortic stenosis is present.  Pulmonic Valve: The pulmonic valve was not well visualized. Pulmonic valve regurgitation is not visualized.  Aorta: The aortic root and ascending aorta are structurally normal, with no evidence of dilitation.  Venous: The inferior vena cava is  normal in size with greater than 50% respiratory variability, suggesting right atrial pressure of 3 mmHg.  IAS/Shunts: The interatrial septum was not well visualized.   LEFT VENTRICLE PLAX 2D LVIDd:         2.90 cm   Diastology LVIDs:         1.80 cm   LV e' medial:    5.11 cm/s LV PW:         1.00 cm   LV E/e' medial:  15.9 LV IVS:        1.60 cm   LV e' lateral:   7.62 cm/s LVOT diam:     1.60 cm   LV E/e' lateral: 10.7 LVOT Area:     2.01 cm   RIGHT VENTRICLE             IVC RV S prime:     16.60 cm/s  IVC diam: 1.50 cm TAPSE (M-mode): 1.8 cm RVSP:           29.6 mmHg  LEFT ATRIUM             Index        RIGHT ATRIUM           Index LA diam:        4.40 cm 2.80 cm/m   RA Pressure: 3.00 mmHg LA Vol (A2C):   41.2 ml 26.17 ml/m  RA Area:     9.62 cm LA Vol (A4C):   56.5 ml 35.90 ml/m  RA Volume:   16.30 ml  10.36 ml/m LA Biplane Vol: 50.5 ml 32.08 ml/m  AORTA  Ao Root diam: 2.80 cm Ao Asc diam:  2.90 cm  MITRAL VALVE                TRICUSPID VALVE MV Area (PHT): 2.33 cm     TR Peak grad:   26.6 mmHg MV Decel Time: 326 msec     TR Vmax:        258.00 cm/s MV E velocity: 81.20 cm/s   Estimated RAP:  3.00 mmHg MV A velocity: 122.00 cm/s  RVSP:           29.6 mmHg MV E/A ratio:  0.67 SHUNTS Systemic Diam: 1.60 cm  Epifanio Lesches MD Electronically signed by Epifanio Lesches MD Signature Date/Time: 12/29/2022/4:23:47 PM    Final    MONITORS  CARDIAC EVENT MONITOR 03/27/2019  Narrative  Sinus bradycardia, normal sinus rhythm and sinus tachycardia with average heart rate 61bpm.  Occasional PVCs with PVC load 1%.   CT SCANS  CT CORONARY MORPH W/CTA COR W/SCORE 02/13/2016  Addendum 02/13/2016  5:40 PM ADDENDUM REPORT: 02/13/2016 17:38  CLINICAL DATA:  Chest pain  EXAM: Cardiac CTA  MEDICATIONS: Sub lingual nitro. 4mg  and lopressor 10mg   TECHNIQUE: Patient was a difficult iv cannulation. Dr Jetty Peeks eventually used micro-puncture  technique in IR radiology to start iv  The patient was scanned on a Philips 256 slice scanner. Gantry rotation speed was 270 msecs. Collimation was .9mm. A 100 kV prospective scan was triggered in the descending thoracic aorta at 111 HU's with 5% padding centered around 78% of the R-R interval. Average HR during the scan was 72 bpm. The 3D data set was interpreted on a dedicated work station using MPR, MIP and VRT modes. A total of 80cc of contrast was used.  FINDINGS: Non-cardiac: See separate report from [facility]. No significant findings on limited lung and soft tissue windows.  Calcium Score:  2 with isolated area of calcification in LM  Coronary Arteries: Right dominant with no anomalies  LM:  Small area of non obstructive calcium  LAD:  Normal tortuous  D1:  Normal large and branching  Circumflex:  Left dominant normal  OM1: Normal  OM2: Normal  RCA:  Small and non dominant normal  PDA:  Left sided normal  PLA:  Left sided normal  IMPRESSION: 1) Poor quality study due to motion artifact and somewhat poor filling of coronary arteries with tortuosity  2) Calcium score 2 isolated foci in LM 23rd percentile for age and sex matched controls  3) Normal left dominant coronary arteries with noted motion artifact and poor distal vessel visualization  Charlton Haws   Electronically Signed By: Charlton Haws M.D. On: 02/13/2016 17:38  Narrative EXAM: OVER-READ INTERPRETATION  CT CHEST  The following report is an over-read performed by radiologist Dr. Royal Piedra [facility] on 02/13/2016. This over-read does not include interpretation of cardiac or coronary anatomy or pathology. The coronary calcium score/coronary CTA interpretation by the cardiologist is attached.  COMPARISON:  None.  FINDINGS: Aortic atherosclerosis. Within the visualized portions of the thorax there are no suspicious appearing pulmonary nodules or masses,  there is no acute consolidative airspace disease, no pleural effusions, no pneumothorax and no lymphadenopathy. Visualized portions of the upper abdomen again demonstrate aortic atherosclerosis. High density material in the collecting systems of the kidneys bilaterally, presumably contrast material related to test bolus injection. There are no aggressive appearing lytic or blastic lesions noted in the visualized portions of the skeleton.  IMPRESSION: 1. Aortic atherosclerosis. 2.  No acute incidental noncardiac findings noted.  Electronically Signed: By: Trudie Reed M.D. On: 02/13/2016 16:32   CARDIAC MRI  MR CARDIAC MORPHOLOGY W WO CONTRAST 05/13/2022  Narrative CLINICAL DATA:  Clinical question of hypertrophic cardiomyopathy Study assumes BSA of 1.63 m2.  EXAM: CARDIAC MRI  TECHNIQUE: The patient was scanned on a 1.5 Tesla GE magnet. A dedicated cardiac coil was used. Functional imaging was done using Fiesta sequences. 2,3, and 4 chamber views were done to assess for RWMA's. Modified Simpson's rule using a short axis stack was used to calculate an ejection fraction on a dedicated work [employer]. The patient received 7 cc of Gadavist. After 10 minutes inversion recovery sequences were used to assess for infiltration and scar tissue. Velocity encoding sequences performed for valve assessment.  CONTRAST:  7 cc  of Gadavist  FINDINGS: 1. Normal left ventricular size, with LVEDD 35 mm, and LVEDVi 58 mL/m2.  Severe asymmetric septal hypertrophy, with intraventricular septal thickness of 17 mm, posterior wall thickness of 6 mm, and myocardial mass index of 49 g/m2.  Normal left ventricular systolic function (LVEF =67%). There is systolic anterior motion of the anterior mitral valve leaflet.  Left ventricular parametric mapping notable for normal T2 and ECV signal.  There is no late gadolinium enhancement in the left ventricular myocardium. Six  standard deviation assessment used.  2. Normal right ventricular size with RVEDVI 50 mL/m2.  Normal right ventricular thickness.  Normal right ventricular systolic function (RVEF =55%). There are no regional wall motion abnormalities or aneurysms.  3.  Moderate left atrial enlargement and normal right atrial size.  4. Normal size of the aortic root, ascending aorta and pulmonary artery.  5. Valve assessment:  Aortic Valve: Tri-leaflet aortic valve. No significant regurgitation. Regurgitant fraction 2 %.  LVOT: Aliasing artifact. Peak LVOT Velocity 3.16 m/s at best estimate. LVOT gradient is above 16 mm Hg.  Pulmonic Valve: No significant regurgitation. Regurgitant fraction 3 %.  Tricuspid Valve: Mild tricuspid regurgitation. Regurgitant fraction 17 %.  Mitral Valve: Moderate mitral regurgitation from SAM. Regurgitant fraction 27 %.  6. Normal pericardium. Small pericardial effusion anterior to the RV base and posterior to the LV base with no evidence if increased pericardial pressure.  7. Grossly, no extracardiac findings. Recommended dedicated study if concerned for non-cardiac pathology.  IMPRESSION: Consistent with hypertrophic cardiomyopathy.  Maximal septal thickness 17 mm.  No LGE.  No Aneurysm.  LVEF 67%.  LVOT obstruction noted.  Moderate mitral regurgitation.  Compared to prior slight increase in hypertrophy with increase in MR and LAE, but with no LGE.  Riley Lam MD   Electronically Signed By: Riley Lam M.D. On: 05/13/2022 13:33          Recent Labs: 08/05/2022: Hemoglobin 11.6; Platelets 141 08/19/2022: BUN 10; Creatinine, Ser 1.00; Potassium 4.6; Sodium 139  Recent Lipid Panel    Component Value Date/Time   CHOL 154 09/16/2020 1153   TRIG 60 09/16/2020 1153   HDL 55 09/16/2020 1153   CHOLHDL 2.8 09/16/2020 1153   CHOLHDL 3.9 04/22/2016 0017   VLDL 52 (H) 04/22/2016 0017   LDLCALC 87 09/16/2020 1153        Physical Exam:    VS:  BP (!) 132/58   Pulse (!) 57   Ht 5\' 1"  (1.549 m)   Wt 126 lb (57.2 kg)   SpO2 98%   BMI 23.81 kg/m     Wt Readings from Last 3 Encounters:  12/31/22 126 lb (  57.2 kg)  08/19/22 126 lb 9.6 oz (57.4 kg)  08/04/22 135 lb (61.2 kg)    GEN: Elderly female in no acute distress HEENT: Normal NECK: No JVD CARDIAC: RRR, Systolic murmur at rest more prominent with Valsalva, rubs, gallops RESPIRATORY:  Clear to auscultation without rales, wheezing or rhonchi  ABDOMEN: Soft, non-tender, non-distended MUSCULOSKELETAL:  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 HOCM (hypertrophic obstructive cardiomyopathy) (HCC)   2. Coronary artery disease involving native coronary artery of native heart without angina pectoris   3. Essential hypertension, benign     PLAN:    Hypertrophic Cardiomyopathy favored over Hypertensive heart disease - septal Variant  - peak gradient 17 on Valvalva earlier this week - with SAM MR that has likely persistent when not supine - suspicion of Fabry's/Danon or other mimics of HCM: HTN has been elevated - Gene variant: NA  - NYHA II-III - I suspect her symptoms of chest pain either come from obstructive ostial LAD disease or due to inducible gradient HCM - BP is controlled; we are have ben unable to titrate of ARB due to re-fractory BP; she would not tolerate Imdur - Would recommend LHC; if able to intervene on diagonal would recommended this for angina; if not would ask operator to get an invasive LVOT - if PCI; will see Tessa in follow - if gradient about 30 mm Hg will come back to see me for CMI discussion - if not will plan for norpace start and EKG f/u after  Family history reviewed, Discussed family screening  (2 sons negative echo screening)   SCD  Assessment SDM: we have discussed lower risk of SCD; no plans for f/u suveillance for this at  this time; she is 77 years young  Atrial fibrillation (no evidence of AF)  Risks and benefits of cardiac catheterization have been discussed with the patient.  These include bleeding, infection, kidney damage, stroke, heart attack, death.  The patient understands these risks and is willing to proceed.  Access recommendations: R radial Procedural considerations has limited options for medical management; would intervene if feasible   CAD: Continue ASA, statin, zetia   Time Spent Directly with Patient:   I have spent a total of 50 minutes with the patient reviewing notes, imaging, EKGs, labs and examining the patient as well as establishing an assessment and plan that was discussed personally with the patient.  > 50% of time was spent in direct patient care and family and reviewing imaging with patient.        Medication Adjustments/Labs and Tests Ordered: Current medicines are reviewed at length with the patient today.  Concerns regarding medicines are outlined above.  Orders Placed This Encounter  Procedures   Basic metabolic panel   CBC   EKG 12-Lead   Meds ordered this encounter  Medications   sodium chloride flush (NS) 0.9 % injection 3 mL    Patient Instructions  Medication Instructions:  Your physician recommends that you continue on your current medications as directed. Please refer to the Current Medication list given to you today.  *If you need a refill on your cardiac medications before your next appointment, please call your pharmacy*   Lab Work: CBC, BMP  If you have labs (blood work) drawn today and your tests are completely normal, you will receive your results only by: MyChart Message (if you have MyChart) OR  A paper copy in the mail If you have any lab test that is abnormal or we need to change your treatment, we will call you to review the results.   Testing/Procedures: Your physician has requested that you have a cardiac catheterization. Cardiac  catheterization is used to diagnose and/or treat various heart conditions. Doctors may recommend this procedure for a number of different reasons. The most common reason is to evaluate chest pain. Chest pain can be a symptom of coronary artery disease (CAD), and cardiac catheterization can show whether plaque is narrowing or blocking your heart's arteries. This procedure is also used to evaluate the valves, as well as measure the blood flow and oxygen levels in different parts of your heart. For further information please visit [email or web address] Please follow instruction sheet, as given.    Follow-Up:to be determined At [facility], you and your health needs are our priority.  As part of our continuing mission to provide you with exceptional heart care, we have created designated Provider Care Teams.  These Care Teams include your primary Cardiologist (physician) and Advanced Practice Providers (APPs -  Physician Assistants and Nurse Practitioners) who all work together to provide you with the care you need, when you need it.  We recommend signing up for the patient portal called "MyChart".  Sign up information is provided on this After Visit Summary.  MyChart is used to connect with patients for Virtual Visits (Telemedicine).  Patients are able to view lab/test results, encounter notes, upcoming appointments, etc.  Non-urgent messages can be sent to your provider as well.   To learn more about what you can do with MyChart, go to [email or web address].     Provider:   Jari Favre, PA-C       Other Instructions   You are scheduled for a Cardiac Catheterization on Wednesday, June 26 with Dr. [name].  1. Please arrive at the [facility] (Main Entrance A) at [facility]: [address] [place], [place] [address] at 6:30 AM (This time is 2 hour(s) before your procedure to ensure your preparation). Free valet parking service is available. You will check in at  ADMITTING. The support person will be asked to wait in the waiting room.  It is OK to have someone drop you off and come back when you are ready to be discharged.    Special note: Every effort is made to have your procedure done on time. Please understand that emergencies sometimes delay scheduled procedures.  2. Diet: Do not eat solid foods after midnight.  The patient may have clear liquids until 5am upon the day of the procedure.  3. Labs: You will need to have blood drawn TODAY.  4. Medication instructions in preparation for your procedure:   Contrast Allergy: No  Do Not take Spironolactone the day of Heart Cath.    On the morning of your procedure, take your Aspirin 81 mg and any morning medicines NOT listed above.  You may use sips of water.  5. Plan to go home the same day, you will only stay overnight if medically necessary. 6. Bring a current list of your medications and current insurance cards. 7. You MUST have a responsible person to drive you home. 8. Someone MUST be with you the first 24 hours after you arrive home or your discharge will be delayed. 9. Please wear clothes that are easy to get on and off and wear slip-on shoes.  Thank you for allowing [place] to care for  you!   -- [facility] Invasive Cardiovascular services     Signed, Christell Constant, MD  12/31/2022 3:20 PM    [facility]

## 2023-01-01 LAB — BASIC METABOLIC PANEL
BUN/Creatinine Ratio: 19 (ref 12–28)
BUN: 20 mg/dL (ref 8–27)
CO2: 19 mmol/L — ABNORMAL LOW (ref 20–29)
Calcium: 9.8 mg/dL (ref 8.7–10.3)
Chloride: 98 mmol/L (ref 96–106)
Creatinine, Ser: 1.06 mg/dL — ABNORMAL HIGH (ref 0.57–1.00)
Glucose: 89 mg/dL (ref 70–99)
Potassium: 5.1 mmol/L (ref 3.5–5.2)
Sodium: 134 mmol/L (ref 134–144)
eGFR: 52 mL/min/{1.73_m2} — ABNORMAL LOW (ref >59)

## 2023-01-01 LAB — CBC
Hematocrit: 37.8 % (ref 34.0–46.6)
Hemoglobin: 12.2 g/dL (ref 11.1–15.9)
MCH: 29.5 pg (ref 26.6–33.0)
MCHC: 32.3 g/dL (ref 31.5–35.7)
MCV: 91 fL (ref 79–97)
Platelets: 157 10*3/uL (ref 150–450)
RBC: 4.14 x10E6/uL (ref 3.77–5.28)
RDW: 14.5 % (ref 11.7–15.4)
WBC: 7 10*3/uL (ref 3.4–10.8)

## 2023-01-05 ENCOUNTER — Telehealth: Payer: Self-pay | Admitting: Internal Medicine

## 2023-01-05 NOTE — Telephone Encounter (Signed)
Discussed with [facility]-to[facility] Approved Valid to 02/19/2023 [phone]

## 2023-01-05 NOTE — Telephone Encounter (Signed)
Patient is experiencing some side effects she believes its from one or both of these medications listed below    telmisartan (MICARDIS) 80 MG tablet and amLODIPine (NORVASC) 5 MG tablet     Please call [place] [phone] [facility])

## 2023-01-06 ENCOUNTER — Encounter (HOSPITAL_COMMUNITY)
Admission: RE | Disposition: A | Discharge status: Home / Self Care | Payer: Self-pay | Source: Home / Self Care | Attending: Cardiovascular Disease

## 2023-01-06 ENCOUNTER — Other Ambulatory Visit: Payer: Self-pay

## 2023-01-06 ENCOUNTER — Ambulatory Visit (HOSPITAL_COMMUNITY)
Admission: RE | Admit: 2023-01-06 | Discharge: 2023-01-06 | Disposition: A | Discharge status: Home / Self Care | Payer: 59 | Attending: Cardiovascular Disease | Admitting: Cardiovascular Disease

## 2023-01-06 DIAGNOSIS — Z79899 Other long term (current) drug therapy: Secondary | ICD-10-CM | POA: Insufficient documentation

## 2023-01-06 DIAGNOSIS — I119 Hypertensive heart disease without heart failure: Secondary | ICD-10-CM | POA: Insufficient documentation

## 2023-01-06 DIAGNOSIS — I2510 Atherosclerotic heart disease of native coronary artery without angina pectoris: Secondary | ICD-10-CM | POA: Insufficient documentation

## 2023-01-06 DIAGNOSIS — E785 Hyperlipidemia, unspecified: Secondary | ICD-10-CM | POA: Diagnosis not present

## 2023-01-06 DIAGNOSIS — I739 Peripheral vascular disease, unspecified: Secondary | ICD-10-CM | POA: Diagnosis not present

## 2023-01-06 DIAGNOSIS — Z7982 Long term (current) use of aspirin: Secondary | ICD-10-CM | POA: Insufficient documentation

## 2023-01-06 DIAGNOSIS — I421 Obstructive hypertrophic cardiomyopathy: Secondary | ICD-10-CM | POA: Insufficient documentation

## 2023-01-06 DIAGNOSIS — Z87891 Personal history of nicotine dependence: Secondary | ICD-10-CM | POA: Diagnosis not present

## 2023-01-06 HISTORY — PX: LEFT HEART CATH AND CORONARY ANGIOGRAPHY: CATH118249

## 2023-01-06 SURGERY — LEFT HEART CATH AND CORONARY ANGIOGRAPHY
Anesthesia: LOCAL

## 2023-01-06 MED ORDER — CHLORTHALIDONE 25 MG PO TABS
25 | ORAL_TABLET | Freq: Every day | ORAL | 5 refills | Status: DC
Start: 2023-01-06 — End: 2023-01-15

## 2023-01-06 MED ORDER — MIDAZOLAM HCL 2 MG/2ML IJ SOLN
INTRAMUSCULAR | Status: DC | PRN
  Administered 2023-01-06: 1 mg via INTRAVENOUS

## 2023-01-06 MED ORDER — SODIUM CHLORIDE 0.9 % IV SOLN: INTRAVENOUS | Status: AC

## 2023-01-06 MED ORDER — SODIUM CHLORIDE 0.9 % IV SOLN: 250.0000 mL | INTRAVENOUS | Status: DC | PRN

## 2023-01-06 MED ORDER — LABETALOL HCL 5 MG/ML IV SOLN: 10.0000 mg | INTRAVENOUS | Status: DC | PRN

## 2023-01-06 MED ORDER — SODIUM CHLORIDE 0.9% FLUSH: 3.0000 mL | INTRAVENOUS | Status: DC | PRN

## 2023-01-06 MED ORDER — FENTANYL CITRATE (PF) 100 MCG/2ML IJ SOLN
INTRAMUSCULAR | Status: AC
  Filled 2023-01-06: qty 2

## 2023-01-06 MED ORDER — LIDOCAINE HCL (PF) 1 % IJ SOLN
INTRAMUSCULAR | Status: DC | PRN
  Administered 2023-01-06: 5 mL via INTRADERMAL

## 2023-01-06 MED ORDER — SODIUM CHLORIDE 0.9 % WEIGHT BASED INFUSION: 1.0000 mL/kg/h | INTRAVENOUS | Status: DC

## 2023-01-06 MED ORDER — HYDRALAZINE HCL 20 MG/ML IJ SOLN
10.0000 mg | INTRAMUSCULAR | Status: DC | PRN
  Administered 2023-01-06: 10 mg via INTRAVENOUS
  Filled 2023-01-06: qty 1

## 2023-01-06 MED ORDER — FENTANYL CITRATE (PF) 100 MCG/2ML IJ SOLN
INTRAMUSCULAR | Status: DC | PRN
  Administered 2023-01-06: 25 ug via INTRAVENOUS

## 2023-01-06 MED ORDER — ASPIRIN 81 MG PO CHEW: 81.0000 mg | CHEWABLE_TABLET | ORAL | Status: DC

## 2023-01-06 MED ORDER — HEPARIN (PORCINE) IN NACL 1000-0.9 UT/500ML-% IV SOLN
INTRAVENOUS | Status: DC | PRN
  Administered 2023-01-06 (×2): 500 mL

## 2023-01-06 MED ORDER — IOHEXOL 350 MG/ML SOLN
INTRAVENOUS | Status: DC | PRN
  Administered 2023-01-06: 35 mL

## 2023-01-06 MED ORDER — SODIUM CHLORIDE 0.9% FLUSH: 3.0000 mL | Freq: Two times a day (BID) | INTRAVENOUS | Status: DC

## 2023-01-06 MED ORDER — ACETAMINOPHEN 325 MG PO TABS: 650.0000 mg | ORAL_TABLET | ORAL | Status: DC | PRN

## 2023-01-06 MED ORDER — LIDOCAINE HCL (PF) 1 % IJ SOLN
INTRAMUSCULAR | Status: AC
  Filled 2023-01-06: qty 30

## 2023-01-06 MED ORDER — MIDAZOLAM HCL 2 MG/2ML IJ SOLN
INTRAMUSCULAR | Status: AC
  Filled 2023-01-06: qty 2

## 2023-01-06 MED ORDER — ONDANSETRON HCL 4 MG/2ML IJ SOLN: 4.0000 mg | Freq: Four times a day (QID) | INTRAMUSCULAR | Status: DC | PRN

## 2023-01-06 MED ORDER — SODIUM CHLORIDE 0.9 % WEIGHT BASED INFUSION
3.0000 mL/kg/h | INTRAVENOUS | Status: AC
  Administered 2023-01-06: 3 mL/kg/h via INTRAVENOUS

## 2023-01-06 MED ORDER — VERAPAMIL HCL 2.5 MG/ML IV SOLN
INTRAVENOUS | Status: AC
  Filled 2023-01-06: qty 2

## 2023-01-06 SURGICAL SUPPLY — 16 items
CATH INFINITI 5FR MULTPACK ANG (CATHETERS) IMPLANT
CATH LANGSTON DUAL LUM PIG 6FR (CATHETERS) IMPLANT
GLIDESHEATH SLEND SS 6F .021 (SHEATH) IMPLANT
GUIDEWIRE INQWIRE 1.5J.035X260 (WIRE) IMPLANT
INQWIRE 1.5J .035X260CM (WIRE) ×1
KIT HEART LEFT (KITS) ×1 IMPLANT
KIT MICROPUNCTURE NIT STIFF (SHEATH) IMPLANT
PACK CARDIAC CATHETERIZATION (CUSTOM PROCEDURE TRAY) ×1 IMPLANT
SHEATH PINNACLE 5F 10CM (SHEATH) IMPLANT
SHEATH PINNACLE 6F 10CM (SHEATH) IMPLANT
SHEATH PROBE COVER 6X72 (BAG) IMPLANT
SYR MEDRAD MARK 7 150ML (SYRINGE) ×1 IMPLANT
TRANSDUCER W/STOPCOCK (MISCELLANEOUS) ×1 IMPLANT
TUBING ART PRESS 72 MALE/FEM (TUBING) IMPLANT
TUBING CIL FLEX 10 FLL-RA (TUBING) ×1 IMPLANT
WIRE EMERALD 3MM-J .035X150CM (WIRE) IMPLANT

## 2023-01-06 NOTE — Interval H&P Note (Signed)
History and Physical Interval Note:  01/06/2023 7:34 AM  [name]  has presented today for surgery, with the diagnosis of CAD.  The various methods of treatment have been discussed with the patient and family. After consideration of risks, benefits and other options for treatment, the patient has consented to  Procedure(s): LEFT HEART CATH AND CORONARY ANGIOGRAPHY (N/A) as a surgical intervention.  The patient's history has been reviewed, patient examined, no change in status, stable for surgery.  I have reviewed the patient's chart and labs.  Questions were answered to the patient's satisfaction.    Cath Lab Visit (complete for each Cath Lab visit)  Clinical Evaluation Leading to the Procedure:   ACS: No.  Non-ACS:    Anginal Classification: CCS III  Anti-ischemic medical therapy: Minimal Therapy (1 class of medications)  Non-Invasive Test Results: No non-invasive testing performed  Prior CABG: No previous CABG        [name]

## 2023-01-06 NOTE — Discharge Instructions (Signed)
Femoral Site Care This sheet gives you information about how to care for yourself after your procedure. Your health care provider may also give you more specific instructions. If you have problems or questions, contact your health care provider. What can I expect after the procedure?  After the procedure, it is common to have: Bruising that usually fades within 1-2 weeks. Tenderness at the site. Follow these instructions at home: Wound care Follow instructions from your health care provider about how to take care of your insertion site. Make sure you: Wash your hands with soap and water before you change your bandage (dressing). If soap and water are not available, use hand sanitizer. Remove your dressing as told by your health care provider. 24 hours Do not take baths, swim, or use a hot tub until your health care provider approves. You may shower 24-48 hours after the procedure or as told by your health care provider. Gently wash the site with plain soap and water. Pat the area dry with a clean towel. Do not rub the site. This may cause bleeding. Do not apply powder or lotion to the site. Keep the site clean and dry. Check your femoral site every day for signs of infection. Check for: Redness, swelling, or pain. Fluid or blood. Warmth. Pus or a bad smell. Activity For the first 2-3 days after your procedure, or as long as directed: Avoid climbing stairs as much as possible. Do not squat. Do not lift anything that is heavier than 10 lb (4.5 kg), or the limit that you are told, until your health care provider says that it is safe. For 5 days Rest as directed. Avoid sitting for a long time without moving. Get up to take short walks every 1-2 hours. Do not drive for 24 hours if you were given a medicine to help you relax (sedative). General instructions Take over-the-counter and prescription medicines only as told by your health care provider. Keep all follow-up visits as told by your  health care provider. This is important. Contact a health care provider if you have: A fever or chills. You have redness, swelling, or pain around your insertion site. Get help right away if: The catheter insertion area swells very fast. You pass out. You suddenly start to sweat or your skin gets clammy. The catheter insertion area is bleeding, and the bleeding does not stop when you hold steady pressure on the area. The area near or just beyond the catheter insertion site becomes pale, cool, tingly, or numb. These symptoms may represent a serious problem that is an emergency. Do not wait to see if the symptoms will go away. Get medical help right away. Call your local emergency services (911 in the [place] Do not drive yourself to the hospital. Summary After the procedure, it is common to have bruising that usually fades within 1-2 weeks. Check your femoral site every day for signs of infection. Do not lift anything that is heavier than 10 lb (4.5 kg), or the limit that you are told, until your health care provider says that it is safe. This information is not intended to replace advice given to you by your health care provider. Make sure you discuss any questions you have with your health care provider. Document Revised: 07/12/2017 Document Reviewed: 07/12/2017 Elsevier Patient Education  2020 [employer]  

## 2023-01-06 NOTE — Progress Notes (Signed)
Site area: left groin 58F pulled by Orlean Bradford, RN Site Prior to Removal:  Level 0 Pressure Applied For: 30 minutes Manual:   yes Patient Status During Pull:  stable Post Pull Site:  Level 0  Post Pull Instructions Given:  yes Post Pull Pulses Present: left dp and pt dopplered Dressing Applied:  gauze and tegaderm Bedrest begins @ 1015 Comments:

## 2023-01-06 NOTE — Telephone Encounter (Signed)
Patient would like a call to discuss medication side effects. She has currently ceased use.     Patient is experiencing some side effects she believes its from one or both of these medications listed below     telmisartan (MICARDIS) 80 MG tablet and amLODIPine (NORVASC) 5 MG tablet      Please call [place] [phone] [facility])

## 2023-01-06 NOTE — Telephone Encounter (Signed)
I called pt back, pt states someone from our office has already called her today. She is waiting for a call back after our staff speak with pcp

## 2023-01-06 NOTE — Telephone Encounter (Signed)
She has no contraindications to taking chlorthalidone again and she can restart it if she is willing.  She was having polyuria and some muscle cramps on it, but her electrolytes and renal function looked okay on most recent labs.  She was taking chlorthalidone 25 mg every other day.  If she can, try to restart it by taking 1/2 pill daily.  Stop amlodipine because of side effect of edema.  Need to consider additional options.  Continue telmisartan.

## 2023-01-06 NOTE — Telephone Encounter (Signed)
Called spoke with pt   Two pt id confirmed   Pt advise per dr. Samuel Bouche as long as she is willing to take chlorthalidone 25 mg 1/2 tab a day and to stop amlodipine and continue with telmisartan   Pt appt schedule for 7/5 at 3:20 pm for med adjustment

## 2023-01-06 NOTE — Telephone Encounter (Signed)
Was on  chlorthalidone stop due to leg cramps now taking  amlodipine pt states she cannot take the amlodipine last dose yesterday   Pt states since she has been on the amlodipine she has experienced swelling bil ankles and feet and flushing of the face and  HA   BP 157/ does not remember bottom #  Telmisartan taken around 8-9 am this morning   BP READINGS WHILE ON THE PHONE   189/62   180/64  173/61  PT DENIES any HA, SOB, dizziness, chest pain, vision changes while on the phone   Advise pt for the swelling to elevate her legs and wear compression stockings advise pt that these are common side effects of amlodipine and bp is elevated from not taking amlodipine, advise pt these elevated readings put her at high chance of stroke and should go to the ER to be evaluated and removal fluid . Pt states she is not  go ing to the ER feel fines   Advise pt I will call her back, she still has chlorthalidone at home and willing to take that instead

## 2023-01-07 ENCOUNTER — Other Ambulatory Visit: Payer: Self-pay | Admitting: Cardiology

## 2023-01-07 ENCOUNTER — Encounter (HOSPITAL_COMMUNITY): Payer: Self-pay | Admitting: Cardiovascular Disease

## 2023-01-15 ENCOUNTER — Ambulatory Visit
Admit: 2023-01-15 | Discharge: 2023-01-15 | Payer: Medicare Other | Attending: Internal Medicine | Primary: Internal Medicine

## 2023-01-15 DIAGNOSIS — I10 Essential (primary) hypertension: Secondary | ICD-10-CM

## 2023-01-15 MED ORDER — CHLORTHALIDONE 25 MG PO TABS
25 MG | ORAL_TABLET | Freq: Every day | ORAL | 1 refills | Status: DC
Start: 2023-01-15 — End: 2023-04-16

## 2023-01-15 NOTE — Progress Notes (Signed)
"  Have you been to the ER, urgent care clinic since your last visit?  Hospitalized since your last visit?"    NO    "Have you seen or consulted any other health care providers outside of [facility] since your last visit?"    NO            Click Here for Release of Records Request

## 2023-01-15 NOTE — Progress Notes (Signed)
HISTORY OF PRESENT ILLNESS    Chief Complaint   Patient presents with    Blood Pressure Check     Elevated readings        Presents for follow-up.  She is accompanied by her daughter, Kennyth Arnold    Hypertension.  Was seen by me on May 30 and complained of ongoing leg cramps and polyuria.  We discontinued chlorthalidone and started amlodipine.  She says she took it, but could not tolerate it because of significant side effects of bilateral ankle edema, flushing of the face, headaches.  Blood pressure also was high.  Stopped amlodipine 01/06/23 after calling the office, per my instructions..    Taking 1/2 pill of chlorthalidone since 6/26 and increased to 25 mg pill for the past 5 days.  Says she she urinating a lot., but she feels "normal" now that she is increased to 1 whole pill..    Taking micardis 80 mg daily as well, as prescribed.  Cut back caffeine to 1 cup of 1/2 caff per day this week in hopes that will also help with her hypertension.  Edema has completely resolved.  Leg cramps are mild at most.  Not taking potassium    Labs on May 30 reviewed with patient.  Normal renal function, electrolytes, hepatic function, CBC.  She asks about vitamin D level and is taking vitamin D.  Reassured it does not be repeated.    Review of Systems   All other systems reviewed and are negative, except as noted in HPI    Past Medical and Surgical History   has a past medical history of Endometrial carcinoma (HCC), Essential hypertension, Hematuria, Knee pain, Mixed hyperlipidemia, Nausea & vomiting, Osteopenia, Pap smear for cervical cancer screening, Spider varicose veins, Urinary incontinence, and Vitamin D deficiency.     has a past surgical history that includes Colonoscopy (01/2010); Total knee arthroplasty (2012); Knee arthroscopy; other surgical history (01/2019); and Total abdominal hysterectomy w/ bilateral salpingoophorectomy (02/13/2019).     reports that she has never smoked. She has never used smokeless tobacco. She  reports that she does not drink alcohol and does not use drugs.    family history includes Heart Disease in her brother and mother; Hypertension in her brother and mother; Kidney Disease in her mother; Lung Disease in her brother and father.    Physical Exam   Nursing note and vitals reviewed.  Blood pressure 139/78, pulse 78, temperature 97.7 F (36.5 C), temperature source Oral, resp. rate 18, height 1.626 m (5\' 4" ), weight 77 kg (169 lb 12.8 oz), SpO2 98 %.  Constitutional:  No distress.    Eyes: Conjunctivae are normal.   Ears:  Hearing grossly intact  Cardiovascular: Normal rate. regular rhythm, no murmurs or gallops  No edema  Pulmonary/Chest: Effort normal.   CTAB  Musculoskeletal: moves all 4 extremities   Neurological: Alert and oriented to person, place, and time.   Skin: No visible rash noted.   Psychiatric: Normal mood and affect. Behavior is normal.     Assessment and Plan    Aaron was seen today for blood pressure check.    Diagnoses and all orders for this visit:    Primary hypertension  Blood pressure is under much improved control on Micardis and with resumption of chlorthalidone for the past 10 days.  I am concerned about her risk of dehydration and renal stream, so I do recommend that she try to decrease chlorthalidone back to every other day or take 1/2 pill  daily after making sure her blood pressure settles out over the next week.  Continue Micardis.  Recommend repeat renal function in 3 months and return for annual wellness visit at that time as well.  -     chlorthalidone (HYGROTON) 25 MG tablet; Take 1 tablet by mouth daily Re-start 6.26/24    lab results and schedule of future lab studies reviewed with patient  reviewed medications and side effects in detail    Return to clinic for further evaluation if new symptoms develop       Current Outpatient Medications   Medication Sig    chlorthalidone (HYGROTON) 25 MG tablet Take 1 tablet by mouth daily Re-start 6.26/24    Multiple  Vitamins-Minerals (MULTIVITAMIN WOMEN 50+) TABS Take by mouth daily    latanoprost (XALATAN) 0.005 % ophthalmic solution Place 1 drop into both eyes nightly    triamcinolone (KENALOG) 0.1 % cream Apply topically 2 times daily.    telmisartan (MICARDIS) 80 MG tablet Take 1 tablet by mouth daily    Cholecalciferol 50 MCG (2000 UT) CAPS Take 1 capsule by mouth daily    Naproxen Sodium 220 MG CAPS Take by mouth as needed     No current facility-administered medications for this visit.

## 2023-01-29 DIAGNOSIS — I739 Peripheral vascular disease, unspecified: Secondary | ICD-10-CM | POA: Diagnosis not present

## 2023-01-29 DIAGNOSIS — Z89611 Acquired absence of right leg above knee: Secondary | ICD-10-CM | POA: Diagnosis not present

## 2023-02-09 ENCOUNTER — Encounter: Payer: Self-pay | Admitting: Internal Medicine

## 2023-02-09 ENCOUNTER — Ambulatory Visit: Payer: 59 | Attending: Internal Medicine | Admitting: Internal Medicine

## 2023-02-09 VITALS — BP 115/56 | HR 64 | Ht 61.0 in | Wt 133.8 lb

## 2023-02-09 DIAGNOSIS — E785 Hyperlipidemia, unspecified: Secondary | ICD-10-CM

## 2023-02-09 DIAGNOSIS — I2510 Atherosclerotic heart disease of native coronary artery without angina pectoris: Secondary | ICD-10-CM | POA: Diagnosis not present

## 2023-02-09 DIAGNOSIS — I739 Peripheral vascular disease, unspecified: Secondary | ICD-10-CM | POA: Diagnosis not present

## 2023-02-09 DIAGNOSIS — I421 Obstructive hypertrophic cardiomyopathy: Secondary | ICD-10-CM

## 2023-02-09 MED ORDER — RANOLAZINE ER 500 MG PO TB12: 500.0000 mg | ORAL_TABLET | Freq: Two times a day (BID) | ORAL | 3 refills | Status: DC

## 2023-02-09 MED ORDER — NITROGLYCERIN 0.4 MG SL SUBL: SUBLINGUAL_TABLET | SUBLINGUAL | 3 refills | Status: DC

## 2023-02-09 NOTE — Progress Notes (Signed)
Cardiology Office Note:    Date:  02/09/2023   ID:  [name], DOB [birth year], MRN [id number]  PCP:  Ollen Bowl, MD   [facility] Providers Cardiologist:  Armanda Magic, MD     Referring MD: Ollen Bowl, MD   CC: HCM  History of Present Illness:    [name] is a 82 y.o. 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  CMR septal thickness of 16 mm without LGE.  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  2024: had BP issues in 2024 related to AKI on losartan.  LVOT gradient improved.  There was no LVOT gradient found on cath. CP likely comes from obstructive diagonal vessel that is too small to intervene on.  Patient notes no SOB at rest and no DOE Notes no fatigue. Notes no palpitations Notes  CP- one episode last week.  Occurred after extra salt low.  Improved with laying down and rubbing her chest.  Discussed her anginal episode after going out to [place]. Notes no Dizziness. Notes no syncope.  Has previously tried no other medications BO log reviewed today average heart rate 127/46 heart rate 70 on average.     Past Medical History:  Diagnosis Date   CAD in native artery    a. 04/2016 NSTEMI with cath showing 25% MCx, 75% DX1 lesion and moderate LVD 35-45% out of proportion to CAD and in a pattern of Takotsubo CM (no clear stressor)   Carotid stenosis    Carotid Doppler showed 40 to 59% stenosis of the right carotid artery and less than 50% stenosis of the right common carotid artery.  There is 1 to 39% stenosis of the left internal carotid artery and greater than 50% stenosis of the common carotid artery on the left.  There is also left subclavian artery stenosis. by dopplers 05/2021   Dyslipidemia    GERD  (gastroesophageal reflux disease)    HOCM (hypertrophic obstructive cardiomyopathy) (HCC)    Hypercholesteremia    Hypertension    Hypertensive heart disease    initially she was thought to have a HOCM variant by cath with increased LVOT gradient but echo showed only a gradient across the LVOT and SAM and cardiac MRI did not show any late gad enhancement and only a LVOT gradient with 15mm BSH.  There was some mild SAM and mild MR.  EF normal at 76%.     LVH (left ventricular hypertrophy)    a. cMRI 2018 - showing moderate Basal septal hypertrophy not classic for HOCM morphology with no delayed gadolium uptake to suggest myofibrillar disarray, + SAM with small LVOT gradient in regard to physiology and mild MR.   Mild aortic stenosis    NSTEMI (non-ST elevated myocardial infarction) (HCC) 04/2016   secondary to stress CM - Takotsubo CM   PAC (premature atrial contraction)    Event monitors 10/2013 and 08/2014 showed NSR, ST, PACs, PVCs.    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   CARDIAC CATHETERIZATION N/A 04/21/2016   Procedure: Left Heart Cath and Coronary Angiography;  Surgeon: Corky Crafts, MD;  Location: [facility] INVASIVE CV LAB;  Service: Cardiovascular;  Laterality: N/A;   IR GENERIC HISTORICAL  02/13/2016   IR RADIOLOGY PERIPHERAL GUIDED IV START 02/13/2016 Berdine Dance, MD [facility]INTER[facility] RAD   IR GENERIC HISTORICAL  02/13/2016   IR US GUIDE VASC ACCESS LEFT 02/13/2016 Berdine Dance, MD [facility]   LEFT HEART CATH AND CORONARY  ANGIOGRAPHY N/A 01/06/2023   Procedure: LEFT HEART CATH AND CORONARY ANGIOGRAPHY;  Surgeon: Kathleene Hazel, MD;  Location: [facility] INVASIVE CV LAB;  Service: Cardiovascular;  Laterality: N/A;    Current Medications: Current Meds  Medication Sig   acetaminophen (TYLENOL) 325 MG tablet Take 2 tablets (650 mg total) by mouth every 4 (four) hours as needed for headache or mild pain.   alendronate (FOSAMAX) 70 MG tablet Take 70 mg by mouth every Monday.   aspirin EC 81 MG EC tablet Take 1 tablet (81 mg total) by mouth daily.   atorvastatin (LIPITOR) 80 MG tablet TAKE 1 TABLET BY MOUTH EVERY DAY   Calcium Carbonate-Vitamin D (CALCIUM-D PO) Take 1 tablet by mouth daily.   carvedilol (COREG) 12.5 MG tablet TAKE 1 TABLET BY MOUTH 2 TIMES DAILY.   Cholecalciferol (VITAMIN D) 125 MCG (5000 UT) CAPS Take 5,000 Units by mouth daily.   ezetimibe (ZETIA) 10 MG tablet TAKE 1 TABLET BY MOUTH EVERY DAY   ferrous sulfate 325 (65 FE) MG EC tablet Take 325 mg by mouth 3 (three) times a week.   fluticasone (FLONASE) 50 MCG/ACT nasal spray Place 1 spray into both nostrils daily as needed (congestion).    fluticasone (FLOVENT HFA) 110 MCG/ACT inhaler Inhale 1 puff into the lungs daily as needed (shortness of breath).   gabapentin (NEURONTIN) 300 MG capsule Take 600 mg by mouth 3 (three) times daily.   losartan (COZAAR) 25 MG tablet Take 1 tablet (25 mg total) by mouth daily.   Multiple Vitamin (MULTIVITAMIN WITH MINERALS) TABS tablet Take 1 tablet by mouth daily.   Omega-3 Fatty Acids (OMEGA-3 FISH OIL) 300 MG CAPS Take 300 mg by mouth daily.   omeprazole (PRILOSEC OTC) 20 MG tablet Take 20 mg by mouth daily as needed (for heartburn).   Polyvinyl Alcohol-Povidone (REFRESH OP) Place 1 drop into both eyes daily as needed (dry eyes).   spironolactone (ALDACTONE) 50 MG tablet Take 1 tablet (50 mg total) by mouth daily.   Current Facility-Administered Medications for the 02/09/23 encounter (Office Visit) with  Christell Constant, MD  Medication   sodium chloride flush (NS) 0.9 % injection 3 mL     Allergies:   Ace inhibitors, Codeine, and Penicillins   Social History   Socioeconomic History   Marital status: Single    Spouse name: Not on file   Number of children: Not on file   Years of education: Not on file   Highest education level: Not on file  Occupational History   Not on file  Tobacco Use   Smoking status: Former    Current packs/day: 0.00    Types: Cigarettes    Quit date: 07/13/2004    Years since quitting: 18.5   Smokeless tobacco: Never  Vaping  Use   Vaping status: Never Used  Substance and Sexual Activity   Alcohol use: Yes    Comment: ocassionally    Drug use: No   Sexual activity: Not on file  Other Topics Concern   Not on file  Social History Narrative   Not on file   Social Determinants of Health   Financial Resource Strain: Not on file  Food Insecurity: Not on file  Transportation Needs: Not on file  Physical Activity: Not on file  Stress: Not on file  Social Connections: Not on file     Family History: The patient's family history includes Heart attack in her brother, mother, and sister; Heart disease in her brother, mother, and sister.  ROS:   Please see the history of present illness.     EKGs/Labs/Other Studies Reviewed:    The following studies were reviewed today:  Cardiac Studies & Procedures   CARDIAC CATHETERIZATION  CARDIAC CATHETERIZATION 01/06/2023  Narrative   Mid Cx lesion is 25% stenosed.   Ost 1st Diag lesion is 75% stenosed.   Ramus lesion is 75% stenosed.   Mid LM to Dist LM lesion is 25% stenosed.  Mild distal left main stenosis Mild ostial LAD stenosis Moderate ostial Diagonal stenosis, unchanged from last cath in 2017. Moderate caliber vessel. Mild Circumflex stenosis Small non-dominant RCA 15 mm LV to AO gradient with no significant change post PVC  Recommendations: Medical management of  CAD  Findings Coronary Findings Diagnostic  Dominance: Left  Left Main Mid LM to Dist LM lesion is 25% stenosed.  Left Anterior Descending  First Diagonal Branch Ost 1st Diag lesion is 75% stenosed.  Ramus Intermedius Vessel is moderate in size. Ramus lesion is 75% stenosed.  Left Circumflex Mid Cx lesion is 25% stenosed.  Intervention  No interventions have been documented.   CARDIAC CATHETERIZATION  CARDIAC CATHETERIZATION 04/21/2016  Narrative  Mid Cx lesion, 25 %stenosed.  Ost 1st Diag lesion, 75 %stenosed.  The left ventricular ejection fraction is 35-45% by visual estimate.  There is mild to moderate left ventricular systolic dysfunction in a pattern of Takotsubo cardiomyopathy.  LV end diastolic pressure is mildly elevated.  50-55 mm Hg resting gradient across the LVOT, likely due to subvalvular stenosis. No difficulty crossing the aortic valve.  Severe tortuosity of the right subclavian making torquing catheters difficult. Consider left radial if cath needed in the future.  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  ACE-I in AM if renal function stable.  No clear source of stress that may have caused this episode.  Findings Coronary Findings Diagnostic  Dominance: Left  Left Anterior Descending  First Diagonal Branch  Left Circumflex  Intervention  No interventions have been documented.   STRESS TESTS  MYOCARDIAL PERFUSION IMAGING 11/24/2019  Narrative  Nuclear stress EF: 63%.  There was no ST segment deviation noted during stress.  The study is normal.  This is a low risk study.  The left ventricular ejection fraction is normal (55-65%).   ECHOCARDIOGRAM  ECHOCARDIOGRAM COMPLETE 12/29/2022  Narrative ECHOCARDIOGRAM REPORT    Patient Name:   [name] Date of Exam: 12/29/2022 Medical Rec #:  [id number]       Height:       62.0 in Accession #:    [id number]      Weight:       126.6 lb Date of Birth:   [birth year]       BSA:  1.574 m Patient Age:    82 years        BP:           152/60 mmHg Patient Gender: F               HR:           56 bpm. Exam Location:  [address]  Procedure: 2D Echo, Cardiac Doppler and Color Doppler  Indications:    I51.81 Takotsubo cardiomyopathy  History:        Patient has prior history of Echocardiogram examinations, most recent 08/05/2022. Takotsubo cardiomyopathy, SAM, HOCM, Arrythmias:PVC and PAC; Risk Factors:Hypertension and Dyslipidemia.  Sonographer:    Samule Ohm RDCS Referring Phys: [id number] TESSA N CONTE  IMPRESSIONS   1. Left ventricular ejection fraction, by estimation, is 65 to 70%. The left ventricle has normal function. The left ventricle has no regional wall motion abnormalities. There is severe asymmetric left ventricular hypertrophy of the basal-septal segment. Left ventricular diastolic parameters are consistent with Grade I diastolic dysfunction (impaired relaxation). LVOT peak gradient at rest, with Valsalva 2. Right ventricular systolic function is normal. The right ventricular size is normal. There is normal pulmonary artery systolic pressure. The estimated right ventricular systolic pressure is 29.6 mmHg. 3. The mitral valve is normal in structure. Trivial mitral valve regurgitation. No evidence of mitral stenosis. 4. The aortic valve is tricuspid. Aortic valve regurgitation is not visualized. No aortic stenosis is present. 5. The inferior vena cava is normal in size with greater than 50% respiratory variability, suggesting right atrial pressure of 3 mmHg.  FINDINGS Left Ventricle: Left ventricular ejection fraction, by estimation, is 65 to 70%. The left ventricle has normal function. The left ventricle has no regional wall motion abnormalities. The left ventricular internal cavity size was normal in size. There is severe asymmetric left ventricular hypertrophy of the basal-septal segment. Left ventricular  diastolic parameters are consistent with Grade I diastolic dysfunction (impaired relaxation).  Right Ventricle: The right ventricular size is normal. No increase in right ventricular wall thickness. Right ventricular systolic function is normal. There is normal pulmonary artery systolic pressure. The tricuspid regurgitant velocity is 2.58 m/s, and with an assumed right atrial pressure of 3 mmHg, the estimated right ventricular systolic pressure is 29.6 mmHg.  Left Atrium: Left atrial size was normal in size.  Right Atrium: Right atrial size was normal in size.  Pericardium: There is no evidence of pericardial effusion.  Mitral Valve: The mitral valve is normal in structure. Trivial mitral valve regurgitation. No evidence of mitral valve stenosis.  Tricuspid Valve: The tricuspid valve is normal in structure. Tricuspid valve regurgitation is trivial.  Aortic Valve: The aortic valve is tricuspid. Aortic valve regurgitation is not visualized. No aortic stenosis is present.  Pulmonic Valve: The pulmonic valve was not well visualized. Pulmonic valve regurgitation is not visualized.  Aorta: The aortic root and ascending aorta are structurally normal, with no evidence of dilitation.  Venous: The inferior vena cava is normal in size with greater than 50% respiratory variability, suggesting right atrial pressure of 3 mmHg.  IAS/Shunts: The interatrial septum was not well visualized.   LEFT VENTRICLE PLAX 2D LVIDd:         2.90 cm   Diastology LVIDs:         1.80 cm   LV e' medial:    5.11 cm/s LV PW:         1.00 cm   LV E/e' medial:  15.9 LV  IVS:        1.60 cm   LV e' lateral:   7.62 cm/s LVOT diam:     1.60 cm   LV E/e' lateral: 10.7 LVOT Area:     2.01 cm   RIGHT VENTRICLE             IVC RV S prime:     16.60 cm/s  IVC diam: 1.50 cm TAPSE (M-mode): 1.8 cm RVSP:           29.6 mmHg  LEFT ATRIUM             Index        RIGHT ATRIUM           Index LA diam:        4.40 cm 2.80  cm/m   RA Pressure: 3.00 mmHg LA Vol (A2C):   41.2 ml 26.17 ml/m  RA Area:     9.62 cm LA Vol (A4C):   56.5 ml 35.90 ml/m  RA Volume:   16.30 ml  10.36 ml/m LA Biplane Vol: 50.5 ml 32.08 ml/m  AORTA Ao Root diam: 2.80 cm Ao Asc diam:  2.90 cm  MITRAL VALVE                TRICUSPID VALVE MV Area (PHT): 2.33 cm     TR Peak grad:   26.6 mmHg MV Decel Time: 326 msec     TR Vmax:        258.00 cm/s MV E velocity: 81.20 cm/s   Estimated RAP:  3.00 mmHg MV A velocity: 122.00 cm/s  RVSP:           29.6 mmHg MV E/A ratio:  0.67 SHUNTS Systemic Diam: 1.60 cm  Epifanio Lesches MD Electronically signed by Epifanio Lesches MD Signature Date/Time: 12/29/2022/4:23:47 PM    Final    MONITORS  CARDIAC EVENT MONITOR 03/24/2019  Narrative  Sinus bradycardia, normal sinus rhythm and sinus tachycardia with average heart rate 61bpm.  Occasional PVCs with PVC load 1%.   CT SCANS  CT CORONARY MORPH W/CTA COR W/SCORE 02/13/2016  Addendum 02/13/2016  5:40 PM ADDENDUM REPORT: 02/13/2016 17:38  CLINICAL DATA:  Chest pain  EXAM: Cardiac CTA  MEDICATIONS: Sub lingual nitro. 4mg  and lopressor 10mg   TECHNIQUE: Patient was a difficult iv cannulation. Dr Jetty Peeks eventually used micro-puncture technique in IR radiology to start iv  The patient was scanned on a Philips 256 slice scanner. Gantry rotation speed was 270 msecs. Collimation was .9mm. A 100 kV prospective scan was triggered in the descending thoracic aorta at 111 HU's with 5% padding centered around 78% of the R-R interval. Average HR during the scan was 72 bpm. The 3D data set was interpreted on a dedicated work station using MPR, MIP and VRT modes. A total of 80cc of contrast was used.  FINDINGS: Non-cardiac: See separate report from [facility]. No significant findings on limited lung and soft tissue windows.  Calcium Score:  2 with isolated area of calcification in LM  Coronary Arteries: Right  dominant with no anomalies  LM:  Small area of non obstructive calcium  LAD:  Normal tortuous  D1:  Normal large and branching  Circumflex:  Left dominant normal  OM1: Normal  OM2: Normal  RCA:  Small and non dominant normal  PDA:  Left sided normal  PLA:  Left sided normal  IMPRESSION: 1) Poor quality study due to motion artifact and somewhat poor filling of coronary arteries with  tortuosity  2) Calcium score 2 isolated foci in LM 23rd percentile for age and sex matched controls  3) Normal left dominant coronary arteries with noted motion artifact and poor distal vessel visualization  Charlton Haws   Electronically Signed By: Charlton Haws M.D. On: 02/13/2016 17:38  Narrative EXAM: OVER-READ INTERPRETATION  CT CHEST  The following report is an over-read performed by radiologist Dr. Royal Piedra [facility] on 02/13/2016. This over-read does not include interpretation of cardiac or coronary anatomy or pathology. The coronary calcium score/coronary CTA interpretation by the cardiologist is attached.  COMPARISON:  None.  FINDINGS: Aortic atherosclerosis. Within the visualized portions of the thorax there are no suspicious appearing pulmonary nodules or masses, there is no acute consolidative airspace disease, no pleural effusions, no pneumothorax and no lymphadenopathy. Visualized portions of the upper abdomen again demonstrate aortic atherosclerosis. High density material in the collecting systems of the kidneys bilaterally, presumably contrast material related to test bolus injection. There are no aggressive appearing lytic or blastic lesions noted in the visualized portions of the skeleton.  IMPRESSION: 1. Aortic atherosclerosis. 2. No acute incidental noncardiac findings noted.  Electronically Signed: By: Trudie Reed M.D. On: 02/13/2016 16:32   CARDIAC MRI  MR CARDIAC MORPHOLOGY W WO CONTRAST 05/13/2022  Narrative CLINICAL  DATA:  Clinical question of hypertrophic cardiomyopathy Study assumes BSA of 1.63 m2.  EXAM: CARDIAC MRI  TECHNIQUE: The patient was scanned on a 1.5 Tesla GE magnet. A dedicated cardiac coil was used. Functional imaging was done using Fiesta sequences. 2,3, and 4 chamber views were done to assess for RWMA's. Modified Simpson's rule using a short axis stack was used to calculate an ejection fraction on a dedicated work [employer]. The patient received 7 cc of Gadavist. After 10 minutes inversion recovery sequences were used to assess for infiltration and scar tissue. Velocity encoding sequences performed for valve assessment.  CONTRAST:  7 cc  of Gadavist  FINDINGS: 1. Normal left ventricular size, with LVEDD 35 mm, and LVEDVi 58 mL/m2.  Severe asymmetric septal hypertrophy, with intraventricular septal thickness of 17 mm, posterior wall thickness of 6 mm, and myocardial mass index of 49 g/m2.  Normal left ventricular systolic function (LVEF =67%). There is systolic anterior motion of the anterior mitral valve leaflet.  Left ventricular parametric mapping notable for normal T2 and ECV signal.  There is no late gadolinium enhancement in the left ventricular myocardium. Six standard deviation assessment used.  2. Normal right ventricular size with RVEDVI 50 mL/m2.  Normal right ventricular thickness.  Normal right ventricular systolic function (RVEF =55%). There are no regional wall motion abnormalities or aneurysms.  3.  Moderate left atrial enlargement and normal right atrial size.  4. Normal size of the aortic root, ascending aorta and pulmonary artery.  5. Valve assessment:  Aortic Valve: Tri-leaflet aortic valve. No significant regurgitation. Regurgitant fraction 2 %.  LVOT: Aliasing artifact. Peak LVOT Velocity 3.16 m/s at best estimate. LVOT gradient is above 16 mm Hg.  Pulmonic Valve: No significant regurgitation. Regurgitant fraction  3 %.  Tricuspid Valve: Mild tricuspid regurgitation. Regurgitant fraction 17 %.  Mitral Valve: Moderate mitral regurgitation from SAM. Regurgitant fraction 27 %.  6. Normal pericardium. Small pericardial effusion anterior to the RV base and posterior to the LV base with no evidence if increased pericardial pressure.  7. Grossly, no extracardiac findings. Recommended dedicated study if concerned for non-cardiac pathology.  IMPRESSION: Consistent with hypertrophic cardiomyopathy.  Maximal septal thickness 17  mm.  No LGE.  No Aneurysm.  LVEF 67%.  LVOT obstruction noted.  Moderate mitral regurgitation.  Compared to prior slight increase in hypertrophy with increase in MR and LAE, but with no LGE.  Riley Lam MD   Electronically Signed By: Riley Lam M.D. On: 05/13/2022 13:33          Recent Labs: 12/31/2022: BUN 20; Creatinine, Ser 1.06; Hemoglobin 12.2; Platelets 157; Potassium 5.1; Sodium 134  Recent Lipid Panel    Component Value Date/Time   CHOL 154 09/16/2020 1153   TRIG 60 09/16/2020 1153   HDL 55 09/16/2020 1153   CHOLHDL 2.8 09/16/2020 1153   CHOLHDL 3.9 04/22/2016 0017   VLDL 52 (H) 04/22/2016 0017   LDLCALC 87 09/16/2020 1153       Physical Exam:    VS:  BP (!) 115/56   Pulse 64   Ht 5\' 1"  (1.549 m)   Wt 133 lb 12.8 oz (60.7 kg)   SpO2 94%   BMI 25.28 kg/m     Wt Readings from Last 3 Encounters:  02/09/23 133 lb 12.8 oz (60.7 kg)  01/06/23 126 lb (57.2 kg)  12/31/22 126 lb (57.2 kg)    GEN: Elderly female in no acute distress HEENT: Normal NECK: No JVD CARDIAC: RRR, Systolic murmur at rest more prominent with Valsalva, no rubs, gallops RESPIRATORY:  Clear to auscultation without rales, wheezing or rhonchi  ABDOMEN: Soft, non-tender, non-distended MUSCULOSKELETAL:  R leg prothesis in place SKIN: Warm and dry NEUROLOGIC:  Alert and oriented x 3 PSYCHIATRIC:  Normal affect   ASSESSMENT:    1. Coronary artery  disease involving native coronary artery of native heart without angina pectoris   2. HOCM (hypertrophic obstructive cardiomyopathy) (HCC)   3. Dyslipidemia      PLAN:    Hypertrophic Cardiomyopathy favored over Hypertensive heart disease - septal Variant  - peak gradient 17 on Valvalva in 2024 - with SAM MR that has likely persistent when not supine - suspicion of Fabry's/Danon or other mimics of HCM: Has concurrent CAD - Gene variant: NA  - NYHA II-III - - BP is controlled; we are have ben unable to titrate of ARB due to re-fractory BP; she would likely not tolerate Imdur Family history reviewed, Discussed family screening  (2 sons negative echo screening)  SCD  Assessment SDM: we have discussed lower risk of SCD; no plans for f/u suveillance for this at this time; she is 25 years young  Atrial fibrillation (no evidence of AF)  CAD - Continue ASA, statin, zetia, LDL < 55 - this is the larger contributor to her symptoms - unable to intervene at this time - will get PRN nitroglycerin - I have offered her ranolazine 500 mg PO BID  She seems unaware of her diagnosis of CAD or HCM - her son is aware of her medical problems - we re-reviewed her CT imaging today  Time Spent Directly with Patient:   I have spent a total of 40 minutes with the patient reviewing notes, imaging, EKGs, labs and examining the patient as well as establishing an assessment and plan that was discussed personally with the patient.  > 50% of time was spent in direct patient care and family and reviewing imaging with patient.  Winter f/u with me      Medication Adjustments/Labs and Tests Ordered: Current medicines are reviewed at length with the patient today.  Concerns regarding medicines are outlined above.  No orders of the defined  types were placed in this encounter.  No orders of the defined types were placed in this encounter.   There are no Patient Instructions on file for this visit.    Signed, Christell Constant, MD  02/09/2023 11:33 AM    [facility]

## 2023-02-09 NOTE — Patient Instructions (Signed)
Medication Instructions:  Your physician has recommended you make the following change in your medication:  START: ranolazine (Ranexa) 500 mg by mouth twice daily START: Nitroglycerin .04 mg under your tongue as needed for Chest Pain  You may place 1 tablet under your tongue every 5 min for 15 min  *If you need a refill on your cardiac medications before your next appointment, please call your pharmacy*   Lab Work: NONE If you have labs (blood work) drawn today and your tests are completely normal, you will receive your results only by: MyChart Message (if you have MyChart) OR A paper copy in the mail If you have any lab test that is abnormal or we need to change your treatment, we will call you to review the results.   Testing/Procedures: NONE   Follow-Up: At [facility], you and your health needs are our priority.  As part of our continuing mission to provide you with exceptional heart care, we have created designated Provider Care Teams.  These Care Teams include your primary Cardiologist (physician) and Advanced Practice Providers (APPs -  Physician Assistants and Nurse Practitioners) who all work together to provide you with the care you need, when you need it.   Your next appointment:   6 month(s)  Provider:   Riley Lam, MD

## 2023-04-05 ENCOUNTER — Other Ambulatory Visit: Payer: Self-pay | Admitting: Internal Medicine

## 2023-04-06 ENCOUNTER — Telehealth: Payer: Self-pay | Admitting: Orthopedic Surgery

## 2023-04-06 NOTE — Telephone Encounter (Signed)
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 Pt advised to give her a call back please. Jezel Brisky [phone]

## 2023-04-06 NOTE — Telephone Encounter (Signed)
Last ov 10/2022 can you please write?

## 2023-04-07 ENCOUNTER — Telehealth: Payer: Self-pay | Admitting: Internal Medicine

## 2023-04-07 DIAGNOSIS — I421 Obstructive hypertrophic cardiomyopathy: Secondary | ICD-10-CM

## 2023-04-07 DIAGNOSIS — I10 Essential (primary) hypertension: Secondary | ICD-10-CM

## 2023-04-07 MED ORDER — SPIRONOLACTONE 50 MG PO TABS: 50.0000 mg | ORAL_TABLET | Freq: Every day | ORAL | 3 refills | Status: DC

## 2023-04-07 NOTE — Telephone Encounter (Signed)
Pt informed. Rx being faxed to [facility].

## 2023-04-07 NOTE — Telephone Encounter (Signed)
*  STAT* If patient is at the pharmacy, call can be transferred to refill team.   1. Which medications need to be refilled? (please list name of each medication and dose if known)  Spironolactone   2. Would you like to learn more about the convenience, safety, & potential cost savings by using the [facility]?     3. Are you open to using the [facility] (Type [facility].   4. Which pharmacy/location (including street and city if local pharmacy) is medication to be sent to? [facility]  [place], [place]     5. Do they need a 30 day or 90 day supply? 90 days and refills

## 2023-04-07 NOTE — Telephone Encounter (Signed)
done

## 2023-04-07 NOTE — Telephone Encounter (Signed)
Pt's medication was sent to pt's pharmacy as requested. Confirmation received.  °

## 2023-04-16 ENCOUNTER — Ambulatory Visit: Admit: 2023-04-16 | Discharge: 2023-04-21 | Payer: MEDICARE | Attending: Internal Medicine | Primary: Internal Medicine

## 2023-04-16 ENCOUNTER — Encounter

## 2023-04-16 DIAGNOSIS — Z0000 Encounter for general adult medical examination without abnormal findings: Secondary | ICD-10-CM

## 2023-04-16 DIAGNOSIS — I10 Essential (primary) hypertension: Secondary | ICD-10-CM

## 2023-04-16 MED ORDER — CHLORTHALIDONE 25 MG PO TABS
25 | ORAL_TABLET | Freq: Every day | ORAL | 1 refills | Status: DC
Start: 2023-04-16 — End: 2024-03-16

## 2023-04-16 NOTE — Progress Notes (Signed)
 Medicare Annual Wellness Visit    Valerie Barry is here for Medicare AWV and Lab Collection (Nonfasting.)    Valerie Barry was seen today for medicare awv and lab collection.    Diagnoses and all orders for this visit:    Medicare annual wellness visit, subsequent  She will get seasonal influenza vaccine and COVID-19 vaccine at the pharmacy.    Primary hypertension  Blood pressure is under borderline control.  Difficulty tolerating some medications and aggressive control is not warranted due to risk of dizziness.  Continue telmisartan  and 1/2 pill of chlorthalidone .  Check electrolytes.  Increasing close down his leg because increasing spasm and strain kidney function.  Her kidney function has been stable and normal for her age.  -     chlorthalidone  (HYGROTON ) 25 MG tablet; Take 0.5 tablets by mouth daily Re-start 6.26/24  -     Comprehensive Metabolic Panel; Future  -     CBC; Future  -     Lipid Panel; Future    Mixed hyperlipidemia  Unclear status.  On no current medication.    -     Lipid Panel; Future    Situational anxiety  Offered my support.  Significant stress regarding her brother.    Endometrial carcinoma (HCC)  2020.  History of email and radiation therapy.  Followed by Dr. Franchot.      Recommendations for Preventive Services Due: see orders and patient instructions/AVS.  Recommended screening schedule for the next 5-10 years is provided to the patient in written form: see Patient Instructions/AVS.     No follow-ups on file.     Subjective   The following acute and/or chronic problems were also addressed today:    Presents with her daughter, Glade.  Has new kitten which is keeping her busy.  Significant stress regarding her brother,Sonny, who has very significant social and physical stresses and poor living conditions.    Muscle cramps are ongoing, but stable.  Tried [employer] muscle relief without relief.  Tried stopping chlorthalidone  but did not tolerate options as below.    Hypertension  Did not tolerate  amlodipine  due to swelling bil ankles and feet and flushing of the face and HA.  Re-started chlorthalidone  1/2 of 25 mg.  Taking telmisartan   Home BP 140/80s  Hypertension ROS: taking medications as instructed, no medication side effects noted, no TIA's, no chest pain on exertion, no dyspnea on exertion, no swelling of ankles     reports that she has never smoked. She has never used smokeless tobacco.    reports no history of alcohol use.   BP Readings from Last 2 Encounters:   04/16/23 (!) 148/73   01/15/23 139/78     Hyperlipidemia  On no medication for cholesterol.  No new myalgias, no joint pains, no weakness  No TIA's, no chest pain on exertion, no dyspnea on exertion, no swelling of ankles.   Lab Results   Component Value Date    CHOL 236 (H) 04/16/2023    TRIG 126 04/16/2023    HDL 71 04/16/2023    LDL 139.8 (H) 04/16/2023    VLDL 25.2 04/16/2023    CHOLHDLRATIO 3.3 04/16/2023       Patient's complete Health Risk Assessment and screening values have been reviewed and are found in Flowsheets. The following problems were reviewed today and where indicated follow up appointments were made and/or referrals ordered.    Positive Risk Factor Screenings with Interventions:       Cognitive:  Clock Drawing Test (CDT): (!) Abnormal (Was confused with positioining the hands of the clock to show 11:10. Asked for the paper back to re-draw the clock after seeing a clock on the wall.)  Words recalled: 3 Words Recalled  Total Score: 3  Total Score Interpretation: Normal Mini-Cog  Interventions:  She appears to be quite cognitively intact.  Difficulty drawing a clock only.                 Vision Screen:  Do you have difficulty driving, watching TV, or doing any of your daily activities because of your eyesight?: No  Have you had an eye exam within the past year?: (!) No  Interventions:   Patient encouraged to make appointment with their eye specialist      Advanced Directives:  Do you have a Living Will?: (!)  No    Intervention:  has an advanced directive - a copy HAS NOT been provided.                                     Objective   Vitals:    04/16/23 1525   BP: (!) 148/73   Site: Left Upper Arm   Position: Sitting   Cuff Size: Medium Adult   Pulse: 69   Resp: 18   Temp: 97.9 F (36.6 C)   TempSrc: Oral   SpO2: 97%   Weight: 77.1 kg (169 lb 14.4 oz)   Height: 1.626 m (5' 4)      Body mass index is 29.16 kg/m.        General Appearance: alert and oriented to person, place and time, well developed and well- nourished, in no acute distress  Skin: warm and dry, no rash or erythema  Head: normocephalic and atraumatic  Eyes: pupils equal, round, and reactive to light, extraocular eye movements intact, conjunctivae normal  ENT: tympanic membrane, external ear and ear canal normal bilaterally, nose without deformity, nasal mucosa and turbinates normal without polyps  Neck: supple and non-tender without mass, no thyromegaly or thyroid nodules, no cervical lymphadenopathy  Pulmonary/Chest: clear to auscultation bilaterally- no wheezes, rales or rhonchi, normal air movement, no respiratory distress  Cardiovascular: normal rate, regular rhythm, normal S1 and S2, no murmurs, rubs, clicks, or gallops, distal pulses intact, no carotid bruits  Abdomen: soft, non-tender, non-distended, normal bowel sounds, no masses or organomegaly  Extremities: no cyanosis, clubbing or edema  Musculoskeletal: normal range of motion, no joint swelling, deformity or tenderness  Neurologic: reflexes normal and symmetric, no cranial nerve deficit, gait, coordination and speech normal            Allergies   Allergen Reactions    Amlodipine  Swelling and Headaches    Codeine Nausea Only    Lisinopril Other (See Comments)     Facial flushing     Prior to Visit Medications    Medication Sig Taking? Authorizing Provider   chlorthalidone  (HYGROTON ) 25 MG tablet Take 0.5 tablets by mouth daily Re-start 6.26/24 Yes Raizel Wesolowski E, MD   Multiple Vitamins-Minerals  (MULTIVITAMIN WOMEN 50+) TABS Take by mouth daily Yes [provider]   latanoprost (XALATAN) 0.005 % ophthalmic solution Place 1 drop into both eyes nightly Yes [provider]   triamcinolone  (KENALOG ) 0.1 % cream Apply topically 2 times daily. Yes Sommer Spickard, Norleen BRAVO, MD   telmisartan  (MICARDIS ) 80 MG tablet Take 1 tablet by mouth daily Yes  Martha Ellerby, Norleen BRAVO, MD   Cholecalciferol 50 MCG (2000 UT) CAPS Take 1 capsule by mouth daily Yes Automatic Reconciliation, Ar   Naproxen Sodium 220 MG CAPS Take by mouth as needed Yes Automatic Reconciliation, Ar       CareTeam (Including outside providers/suppliers regularly involved in providing care):   Patient Care Team:  Havana, Norleen BRAVO, MD as PCP - General  Silvino Selman, Norleen BRAVO, MD as PCP - Empaneled Provider      Reviewed and updated this visit:  Allergies  Meds  Problems  Med Hx  Surg Hx  Soc Hx  Fam Hx

## 2023-04-16 NOTE — Progress Notes (Signed)
"  Have you been to the ER, urgent care clinic since your last visit?  Hospitalized since your last visit?"    NO    "Have you seen or consulted any other health care providers outside our system since your last visit?"    NO

## 2023-04-16 NOTE — Patient Instructions (Signed)
Learning About Mild Cognitive Impairment (MCI)  What is mild cognitive impairment (MCI)?     It's common to forget things sometimes as we get older. But some older people have memory loss that's more than normal aging. It's called mild cognitive impairment, or MCI. It is not the same as dementia.  People with the condition often know that their memory or mental function has changed. Tests may show some loss. But their minds work well overall. They can carry out daily tasks that are normal for them.  People with MCI have a higher chance of one day getting dementia. But not all people who have it will get dementia. Some people may stay the same over time.  What are the symptoms?  People with MCI have more memory loss than what occurs with normal aging. They may have increasing trouble with recalling words and keeping up with conversations. They may also have trouble remembering important events and making decisions.  What puts you at risk?  The risk of getting MCI increases with age. Having high blood pressure or having a family history of MCI may also increase your risk.  How is it diagnosed?  Your doctor will do a physical exam.  You may be asked questions to check your memory and other mental skills. Your doctor may also talk to close friends and family members. This can help the doctor figure out how your memory and other mental skills have changed.  You may get blood tests and tests that look at your brain.  These questions and tests can make sure you don't have other conditions that can cause symptoms like MCI. These include depression, sleep problems, and side effects from medicines.  How is it treated?  There are no medicines to treat MCI or to keep it from progressing to dementia. But treating conditions like high blood pressure and diabetes may help. A person with MCI needs routine follow-up visits with their doctor to check on changes in the person's mental skills.  How can you care for yourself at  home?  Keeping your body active can help slow MCI. Exercises like walking can help. Try to stay active mentally too. Read or do things like crossword puzzles if you enjoy doing them.  If you need help coping with MCI, you may want to get support from family, friends, a support group, or a counselor who works with people who have MCI.  Though the future isn't always clear, it can be good to plan ahead with instructions for your care. These are called advanced directives. Having a plan can help make sure that you get the care you want.  Current as of: July 01, 2022  Content Version: 14.2   [address], [place].   Care instructions adapted under license by [facility]. If you have questions about a medical condition or this instruction, always ask your healthcare professional. [employer] disclaims any warranty or liability for your use of this information.           Learning About Vision Tests  What are vision tests?     The four most common vision tests are visual acuity tests, refraction, visual field tests, and color vision tests.  Visual acuity (sharpness) tests  These tests are used:  To see if you need glasses or contact lenses.  To monitor an eye problem.  To check an eye injury.  Visual acuity tests are done as part of routine exams. You may also have this test when  you get your driver's license or apply for some types of jobs.  Visual field tests  These tests are used:  To check for vision loss in any area of your range of vision.  To screen for certain eye diseases.  To look for nerve damage after a stroke, head injury, or other problem that could reduce blood flow to the brain.  Refraction and color tests  A refraction test is done to find the right prescription for glasses and contact lenses.  A color vision test is done to check for color blindness.  Color vision is often tested as part of a routine exam. You may also have this test when you apply for a job where recognizing  different colors is important, such as truck driving, Optician, dispensing, or the [employer].  How are vision tests done?  Visual acuity test   You cover one eye at a time.  You read aloud from a wall chart across the room.  You read aloud from a small card that you hold in your hand.  Refraction   You look into a special device.  The device puts lenses of different strengths in front of each eye to see how strong your glasses or contact lenses need to be.  Visual field tests   Your doctor may have you look through special machines.  Or your doctor may simply have you stare straight ahead while they move a finger into and out of your field of vision.  Color vision test   You look at pieces of printed test patterns in various colors. You say what number or symbol you see.  Your doctor may have you trace the number or symbol using a pointer.  How do these tests feel?  There is very little chance of having a problem from this test. If dilating drops are used for a vision test, they may make the eyes sting and cause a medicine taste in the mouth.  Follow-up care is a key part of your treatment and safety. Be sure to make and go to all appointments, and call your doctor if you are having problems. It's also a good idea to know your test results and keep a list of the medicines you take.  Where can you learn more?  Go to [employer] and enter G551 to learn more about "Learning About Vision Tests."  Current as of: December 15, 2021  Content Version: 14.2   [address] [place].   Care instructions adapted under license by [facility]. If you have questions about a medical condition or this instruction, always ask your healthcare professional. [employer] disclaims any warranty or liability for your use of this information.           Advance Directives: Care Instructions  Overview  An advance directive is a legal way to state your wishes at the end of your life. It tells your family and your  doctor what to do if you can't say what you want.  There are two main types of advance directives. You can change them any time your wishes change.  Living will.  This form tells your family and your doctor your wishes about life support and other treatment. The form is also called a declaration.  Medical power of attorney.  This form lets you name a person to make treatment decisions for you when you can't speak for yourself. This person is called a health care agent (health care proxy, health care surrogate). The form is  also called a durable power of attorney for health care.  If you do not have an advance directive, decisions about your medical care may be made by a family member, or by a doctor or a judge who doesn't know you.  It may help to think of an advance directive as a gift to the people who care for you. If you have one, they won't have to make tough decisions by themselves.  For more information, including forms for your state, see the [employer] website ([employer]).  Follow-up care is a key part of your treatment and safety. Be sure to make and go to all appointments, and call your doctor if you are having problems. It's also a good idea to know your test results and keep a list of the medicines you take.  What should you include in an advance directive?  Many states have a unique advance directive form. (It may ask you to address specific issues.) Or you might use a universal form that's approved by many states.  If your form doesn't tell you what to address, it may be hard to know what to include in your advance directive. Use the questions below to help you get started.  Who do you want to make decisions about your medical care if you are not able to?  What life-support measures do you want if you have a serious illness that gets worse over time or can't be cured?  What are you most afraid of that might happen? (Maybe you're afraid of having pain, losing your  independence, or being kept alive by machines.)  Where would you prefer to die? (Your home? A hospital? A nursing home?)  Do you want to donate your organs when you die?  Do you want certain religious practices performed before you die?  When should you call for help?  Be sure to contact your doctor if you have any questions.  Where can you learn more?  Go to [employer] and enter R264 to learn more about "Advance Directives: Care Instructions."  Current as of: May 28, 2022  Content Version: 14.2   [address], [place].   Care instructions adapted under license by [facility]. If you have questions about a medical condition or this instruction, always ask your healthcare professional. [employer] disclaims any warranty or liability for your use of this information.           A Healthy Heart: Care Instructions  Overview     Coronary artery disease, also called heart disease, occurs when a substance called plaque builds up in the vessels that supply oxygen-rich blood to your heart muscle. This can narrow the blood vessels and reduce blood flow. A heart attack happens when blood flow is completely blocked. A high-fat diet, smoking, and other factors increase the risk of heart disease.  Your doctor has found that you have a chance of having heart disease. A heart-healthy lifestyle can help keep your heart healthy and prevent heart disease. This lifestyle includes eating healthy, being active, staying at a weight that's healthy for you, and not smoking or using tobacco. It also includes taking medicines as directed, managing other health conditions, and trying to get a healthy amount of sleep.  Follow-up care is a key part of your treatment and safety. Be sure to make and go to all appointments, and call your doctor if you are having problems. It's also a good idea to know your test results and keep a list of the  medicines you take.  How can you care for yourself at  home?  Diet   Use less salt when you cook and eat. This helps lower your blood pressure. Taste food before salting. Add only a little salt when you think you need it. With time, your taste buds will adjust to less salt.    Eat fewer snack items, fast foods, canned soups, and other high-salt, high-fat, processed foods.    Read food labels and try to avoid saturated and trans fats. They increase your risk of heart disease by raising cholesterol levels.    Limit the amount of solid fat--butter, margarine, and shortening--you eat. Use olive, peanut, or canola oil when you cook. Bake, broil, and steam foods instead of frying them.    Eat a variety of fruit and vegetables every day. Dark green, deep orange, red, or yellow fruits and vegetables are especially good for you. Examples include spinach, carrots, peaches, and berries.    Foods high in fiber can reduce your cholesterol and provide important vitamins and minerals. High-fiber foods include whole-grain cereals and breads, oatmeal, beans, brown rice, citrus fruits, and apples.    Eat lean proteins. Heart-healthy proteins include seafood, lean meats and poultry, eggs, beans, peas, nuts, seeds, and soy products.    Limit drinks and foods with added sugar. These include candy, desserts, and soda pop.   Heart-healthy lifestyle   If your doctor recommends it, get more exercise. For many people, walking is a good choice. Or you may want to swim, bike, or do other activities. Bit by bit, increase the time you're active every day. Try for at least 30 minutes on most days of the week.    Try to quit or cut back on using tobacco and other nicotine products. This includes smoking and vaping. If you need help quitting, talk to your doctor about stop-smoking programs and medicines. These can increase your chances of quitting for good. Quitting is one of the most important things you can do to protect your heart. It is never too late to quit. Try to avoid secondhand  smoke too.    Stay at a weight that's healthy for you. Talk to your doctor if you need help losing weight.    Try to get 7 to 9 hours of sleep each night.    Limit alcohol to 2 drinks a day for men and 1 drink a day for women. Too much alcohol can cause health problems.    Manage other health problems such as diabetes, high blood pressure, and high cholesterol. If you think you may have a problem with alcohol or drug use, talk to your doctor.   Medicines   Take your medicines exactly as prescribed. Call your doctor if you think you are having a problem with your medicine.    If your doctor recommends aspirin, take the amount directed each day. Make sure you take aspirin and not another kind of pain reliever, such as acetaminophen (Tylenol).   When should you call for help?   Call 911 if you have symptoms of a heart attack. These may include:   Chest pain or pressure, or a strange feeling in the chest.    Sweating.    Shortness of breath.    Pain, pressure, or a strange feeling in the back, neck, jaw, or upper belly or in one or both shoulders or arms.    Lightheadedness or sudden weakness.    A fast or irregular heartbeat.   After  you call 911, the operator may tell you to chew 1 adult-strength or 2 to 4 low-dose aspirin. Wait for an ambulance. Do not try to drive yourself.  Watch closely for changes in your health, and be sure to contact your doctor if you have any problems.  Where can you learn more?  Go to [employer] and enter F075 to learn more about "A Healthy Heart: Care Instructions."  Current as of: January 03, 2022  Content Version: 14.2   [address] [place].   Care instructions adapted under license by [facility]. If you have questions about a medical condition or this instruction, always ask your healthcare professional. [employer] disclaims any warranty or liability for your use of this information.      Personalized Preventive Plan for Valerie Barry - 04/16/2023  Medicare offers a range of preventive health benefits. Some of the tests and screenings are paid in full while other may be subject to a deductible, co-insurance, and/or copay.    Some of these benefits include a comprehensive review of your medical history including lifestyle, illnesses that may run in your family, and various assessments and screenings as appropriate.    After reviewing your medical record and screening and assessments performed today your provider may have ordered immunizations, labs, imaging, and/or referrals for you.  A list of these orders (if applicable) as well as your Preventive Care list are included within your After Visit Summary for your review.    Other Preventive Recommendations:    A preventive eye exam performed by an eye specialist is recommended every 1-2 years to screen for glaucoma; cataracts, macular degeneration, and other eye disorders.  A preventive dental visit is recommended every 6 months.  Try to get at least 150 minutes of exercise per week or 10,000 steps per day on a pedometer .  Order or download the FREE "Exercise & Physical Activity: Your Everyday Guide" from [employer]. Call (931)650-7440 or search [employer] online.  You need 1200-1500 mg of calcium and 1000-2000 IU of vitamin D per day. It is possible to meet your calcium requirement with diet alone, but a vitamin D supplement is usually necessary to meet this goal.  When exposed to the sun, use a sunscreen that protects against both UVA and UVB radiation with an SPF of 30 or greater. Reapply every 2 to 3 hours or after sweating, drying off with a towel, or swimming.  Always wear a seat belt when traveling in a car. Always wear a helmet when riding a bicycle or motorcycle.

## 2023-04-17 LAB — CBC
Hematocrit: 41.5 % (ref 35.0–47.0)
Hemoglobin: 13.3 g/dL (ref 11.5–16.0)
MCH: 28.9 pg (ref 26.0–34.0)
MCHC: 32 g/dL (ref 30.0–36.5)
MCV: 90 FL (ref 80.0–99.0)
MPV: 11.5 FL (ref 8.9–12.9)
Nucleated RBCs: 0 /100{WBCs}
Platelets: 241 10*3/uL (ref 150–400)
RBC: 4.61 M/uL (ref 3.80–5.20)
RDW: 13.1 % (ref 11.5–14.5)
WBC: 9.1 10*3/uL (ref 3.6–11.0)
nRBC: 0 10*3/uL (ref 0.00–0.01)

## 2023-04-17 LAB — COMPREHENSIVE METABOLIC PANEL
ALT: 12 U/L (ref 12–78)
AST: 12 U/L — ABNORMAL LOW (ref 15–37)
Albumin/Globulin Ratio: 1.5 (ref 1.1–2.2)
Albumin: 3.9 g/dL (ref 3.5–5.0)
Alk Phosphatase: 95 U/L (ref 45–117)
Anion Gap: 6 mmol/L (ref 2–12)
BUN/Creatinine Ratio: 19 (ref 12–20)
BUN: 22 mg/dL — ABNORMAL HIGH (ref 6–20)
CO2: 29 mmol/L (ref 21–32)
Calcium: 9.3 mg/dL (ref 8.5–10.1)
Chloride: 106 mmol/L (ref 97–108)
Creatinine: 1.15 mg/dL — ABNORMAL HIGH (ref 0.55–1.02)
Est, Glom Filt Rate: 47 mL/min/{1.73_m2} — ABNORMAL LOW (ref >60)
Globulin: 2.6 g/dL (ref 2.0–4.0)
Glucose: 100 mg/dL (ref 65–100)
Potassium: 4 mmol/L (ref 3.5–5.1)
Sodium: 141 mmol/L (ref 136–145)
Total Bilirubin: 0.4 mg/dL (ref 0.2–1.0)
Total Protein: 6.5 g/dL (ref 6.4–8.2)

## 2023-04-17 LAB — LIPID PANEL
Chol/HDL Ratio: 3.3 (ref 0.0–5.0)
Cholesterol, Total: 236 mg/dL — ABNORMAL HIGH (ref <200)
HDL: 71 mg/dL
LDL Cholesterol: 139.8 mg/dL — ABNORMAL HIGH (ref 0–100)
Triglycerides: 126 mg/dL (ref <150)
VLDL Cholesterol Calculated: 25.2 mg/dL

## 2023-04-18 NOTE — Other (Signed)
Results reviewed.  Please refer to MyChart comments.

## 2023-04-22 ENCOUNTER — Other Ambulatory Visit: Payer: Self-pay | Admitting: Cardiology

## 2023-04-28 DIAGNOSIS — I671 Cerebral aneurysm, nonruptured: Secondary | ICD-10-CM | POA: Diagnosis not present

## 2023-05-01 DIAGNOSIS — I739 Peripheral vascular disease, unspecified: Secondary | ICD-10-CM | POA: Diagnosis not present

## 2023-05-01 DIAGNOSIS — Z89611 Acquired absence of right leg above knee: Secondary | ICD-10-CM | POA: Diagnosis not present

## 2023-05-14 ENCOUNTER — Other Ambulatory Visit: Payer: Self-pay | Admitting: Cardiology

## 2023-05-20 DIAGNOSIS — D631 Anemia in chronic kidney disease: Secondary | ICD-10-CM | POA: Diagnosis not present

## 2023-05-20 DIAGNOSIS — I2510 Atherosclerotic heart disease of native coronary artery without angina pectoris: Secondary | ICD-10-CM | POA: Diagnosis not present

## 2023-05-20 DIAGNOSIS — I509 Heart failure, unspecified: Secondary | ICD-10-CM | POA: Diagnosis not present

## 2023-05-20 DIAGNOSIS — I10 Essential (primary) hypertension: Secondary | ICD-10-CM | POA: Diagnosis not present

## 2023-05-20 DIAGNOSIS — I130 Hypertensive heart and chronic kidney disease with heart failure and stage 1 through stage 4 chronic kidney disease, or unspecified chronic kidney disease: Secondary | ICD-10-CM | POA: Diagnosis not present

## 2023-05-20 DIAGNOSIS — Z0000 Encounter for general adult medical examination without abnormal findings: Secondary | ICD-10-CM | POA: Diagnosis not present

## 2023-05-20 DIAGNOSIS — I110 Hypertensive heart disease with heart failure: Secondary | ICD-10-CM | POA: Diagnosis not present

## 2023-05-20 DIAGNOSIS — I720 Aneurysm of carotid artery: Secondary | ICD-10-CM | POA: Diagnosis not present

## 2023-05-20 DIAGNOSIS — Z23 Encounter for immunization: Secondary | ICD-10-CM | POA: Diagnosis not present

## 2023-05-20 DIAGNOSIS — D638 Anemia in other chronic diseases classified elsewhere: Secondary | ICD-10-CM | POA: Diagnosis not present

## 2023-05-20 DIAGNOSIS — E7800 Pure hypercholesterolemia, unspecified: Secondary | ICD-10-CM | POA: Diagnosis not present

## 2023-05-20 DIAGNOSIS — N1831 Chronic kidney disease, stage 3a: Secondary | ICD-10-CM | POA: Diagnosis not present

## 2023-05-20 DIAGNOSIS — Z89611 Acquired absence of right leg above knee: Secondary | ICD-10-CM | POA: Diagnosis not present

## 2023-05-20 DIAGNOSIS — I421 Obstructive hypertrophic cardiomyopathy: Secondary | ICD-10-CM | POA: Diagnosis not present

## 2023-06-01 DIAGNOSIS — I739 Peripheral vascular disease, unspecified: Secondary | ICD-10-CM | POA: Diagnosis not present

## 2023-06-01 DIAGNOSIS — Z89611 Acquired absence of right leg above knee: Secondary | ICD-10-CM | POA: Diagnosis not present

## 2023-06-29 ENCOUNTER — Encounter: Admit: 2023-06-29 | Admitting: Internal Medicine

## 2023-06-29 DIAGNOSIS — I10 Essential (primary) hypertension: Secondary | ICD-10-CM

## 2023-07-01 DIAGNOSIS — Z89611 Acquired absence of right leg above knee: Secondary | ICD-10-CM | POA: Diagnosis not present

## 2023-07-01 DIAGNOSIS — I739 Peripheral vascular disease, unspecified: Secondary | ICD-10-CM | POA: Diagnosis not present

## 2023-07-02 DIAGNOSIS — J069 Acute upper respiratory infection, unspecified: Secondary | ICD-10-CM | POA: Diagnosis not present

## 2023-07-02 DIAGNOSIS — R197 Diarrhea, unspecified: Secondary | ICD-10-CM | POA: Diagnosis not present

## 2023-07-02 DIAGNOSIS — Z9989 Dependence on other enabling machines and devices: Secondary | ICD-10-CM | POA: Diagnosis not present

## 2023-07-02 DIAGNOSIS — K30 Functional dyspepsia: Secondary | ICD-10-CM | POA: Diagnosis not present

## 2023-07-02 DIAGNOSIS — R519 Headache, unspecified: Secondary | ICD-10-CM | POA: Diagnosis not present

## 2023-07-02 DIAGNOSIS — I10 Essential (primary) hypertension: Secondary | ICD-10-CM | POA: Diagnosis not present

## 2023-08-01 DIAGNOSIS — Z89611 Acquired absence of right leg above knee: Secondary | ICD-10-CM | POA: Diagnosis not present

## 2023-08-01 DIAGNOSIS — I739 Peripheral vascular disease, unspecified: Secondary | ICD-10-CM | POA: Diagnosis not present

## 2023-08-12 ENCOUNTER — Other Ambulatory Visit: Payer: Self-pay | Admitting: Physician Assistant

## 2023-09-01 DIAGNOSIS — Z89611 Acquired absence of right leg above knee: Secondary | ICD-10-CM | POA: Diagnosis not present

## 2023-09-01 DIAGNOSIS — I739 Peripheral vascular disease, unspecified: Secondary | ICD-10-CM | POA: Diagnosis not present

## 2023-09-13 DIAGNOSIS — Z89611 Acquired absence of right leg above knee: Secondary | ICD-10-CM | POA: Diagnosis not present

## 2023-09-13 DIAGNOSIS — I739 Peripheral vascular disease, unspecified: Secondary | ICD-10-CM | POA: Diagnosis not present

## 2023-09-13 DIAGNOSIS — M79652 Pain in left thigh: Secondary | ICD-10-CM | POA: Diagnosis not present

## 2023-09-13 DIAGNOSIS — M79602 Pain in left arm: Secondary | ICD-10-CM | POA: Diagnosis not present

## 2023-09-13 DIAGNOSIS — R600 Localized edema: Secondary | ICD-10-CM | POA: Diagnosis not present

## 2023-09-13 DIAGNOSIS — J309 Allergic rhinitis, unspecified: Secondary | ICD-10-CM | POA: Diagnosis not present

## 2023-09-14 ENCOUNTER — Other Ambulatory Visit: Payer: Self-pay | Admitting: Internal Medicine

## 2023-09-14 DIAGNOSIS — M79602 Pain in left arm: Secondary | ICD-10-CM

## 2023-09-14 DIAGNOSIS — M79652 Pain in left thigh: Secondary | ICD-10-CM

## 2023-09-21 ENCOUNTER — Ambulatory Visit
Admission: RE | Admit: 2023-09-21 | Discharge: 2023-09-21 | Disposition: A | Discharge status: Home / Self Care | Source: Ambulatory Visit | Attending: Internal Medicine | Admitting: Internal Medicine

## 2023-09-21 DIAGNOSIS — M79602 Pain in left arm: Secondary | ICD-10-CM

## 2023-09-21 DIAGNOSIS — I70212 Atherosclerosis of native arteries of extremities with intermittent claudication, left leg: Secondary | ICD-10-CM | POA: Diagnosis not present

## 2023-09-21 DIAGNOSIS — M79652 Pain in left thigh: Secondary | ICD-10-CM

## 2023-09-23 ENCOUNTER — Other Ambulatory Visit: Payer: Self-pay | Admitting: Internal Medicine

## 2023-09-23 DIAGNOSIS — I70209 Unspecified atherosclerosis of native arteries of extremities, unspecified extremity: Secondary | ICD-10-CM

## 2023-09-24 NOTE — Progress Notes (Shared)
 Chief Complaint: Patient was seen in consultation today for left lower extremity arterial occlusion.   Referring Physician(s): Pahwani,Rinka R  History of Present Illness: [name] is an 83 y.o. female with a medical history significant for CAD, NSTEMI, Takotsubo cardiomyopathy, CKD, carotid stenosis, HTN, cerebral aneurysm, PAD/PVD and right AKA. She was recently evaluated by her PCP and she reported left upper and left lower extremity pain. She also complained of left leg swelling and orthopnea. These symptoms had been worsening over several months. She had similar symptoms in her right leg before it was amputated. Ultrasound studies of the left arm and left leg were ordered and completed 09/21/23. 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   The patient has been kindly referred to Interventional Radiology to discuss possible treatment options for her left lower extremity occlusive disease.   Past Medical History:  Diagnosis Date   CAD in native artery    a. 04/2016 NSTEMI with cath showing 25% MCx, 75% DX1 lesion and moderate LVD 35-45% out of proportion to CAD and in a pattern of Takotsubo CM (no clear stressor)   Carotid stenosis    Carotid Doppler showed 40 to 59% stenosis of the right carotid artery and less than 50% stenosis of the right common carotid artery.  There is 1 to 39% stenosis of the left internal carotid artery and greater than 50% stenosis of the common carotid artery on the left.  There is also left subclavian artery stenosis. by dopplers 05/2021   Dyslipidemia    GERD (gastroesophageal reflux disease)    HOCM (hypertrophic obstructive cardiomyopathy) (HCC)    Hypercholesteremia    Hypertension    Hypertensive heart disease    initially she was thought to have a HOCM  variant by cath with increased LVOT gradient but echo showed only a gradient across the LVOT and SAM and cardiac MRI did not show any late gad enhancement and only a LVOT gradient with 15mm BSH.  There was some mild SAM and mild MR.  EF normal at 76%.     LVH (left ventricular hypertrophy)    a. cMRI 2018 - showing moderate Basal septal hypertrophy not classic for HOCM morphology with no delayed gadolium uptake to suggest myofibrillar disarray, + SAM with small LVOT gradient in regard to physiology and mild MR.   Mild aortic stenosis    NSTEMI (non-ST elevated myocardial infarction) (HCC) 04/2016   secondary to stress CM - Takotsubo CM   PAC (premature atrial contraction)    Event monitors 10/2013 and 08/2014 showed NSR, ST, PACs, PVCs.    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   CARDIAC CATHETERIZATION N/A 04/21/2016   Procedure: Left Heart Cath and Coronary Angiography;  Surgeon: Corky Crafts, MD;  Location: [facility] INVASIVE CV LAB;  Service: Cardiovascular;  Laterality: N/A;  IR GENERIC HISTORICAL  02/13/2016   IR RADIOLOGY PERIPHERAL GUIDED IV START 02/13/2016 Berdine Dance, MD [facility]INTER[facility]   IR GENERIC HISTORICAL  02/13/2016   IR US GUIDE VASC ACCESS LEFT 02/13/2016 Berdine Dance, MD [facility]   LEFT HEART CATH AND CORONARY ANGIOGRAPHY N/A 01/06/2023   Procedure: LEFT HEART CATH AND CORONARY ANGIOGRAPHY;  Surgeon: Kathleene Hazel, MD;  Location: [facility] INVASIVE CV LAB;  Service: Cardiovascular;  Laterality: N/A;     Allergies: Ace inhibitors, Codeine, and Penicillins  Medications: Prior to Admission medications   Medication Sig Start Date End Date Taking? Authorizing Provider  acetaminophen (TYLENOL) 325 MG tablet Take 2 tablets (650 mg total) by mouth every 4 (four) hours as needed for headache or mild pain. 04/23/16   Abelino Derrick, PA-C  alendronate (FOSAMAX) 70 MG tablet Take 70 mg by mouth every Monday.    [provider]  aspirin EC 81 MG EC tablet Take 1 tablet (81 mg total) by mouth daily. 04/23/16   Abelino Derrick, PA-C  atorvastatin (LIPITOR) 80 MG tablet TAKE 1 TABLET BY MOUTH EVERY DAY 09/18/22   Quintella Reichert, MD  Calcium Carbonate-Vitamin D (CALCIUM-D PO) Take 1 tablet by mouth daily.    [provider]  carvedilol (COREG) 12.5 MG tablet TAKE 1 TABLET BY MOUTH TWICE A DAY 04/22/23   Chandrasekhar, Mahesh A, MD  Cholecalciferol (VITAMIN D) 125 MCG (5000 UT) CAPS Take 5,000 Units by mouth daily.    [provider]  ezetimibe (ZETIA) 10 MG tablet TAKE 1 TABLET BY MOUTH EVERY DAY 05/14/23   Quintella Reichert, MD  ferrous sulfate 325 (65 FE) MG EC tablet Take 325 mg by mouth 3 (three) times a week.    [provider]  fluticasone (FLONASE) 50 MCG/ACT nasal spray Place 1 spray into both nostrils daily as needed (congestion).  05/10/17   [provider]  fluticasone (FLOVENT HFA) 110 MCG/ACT inhaler Inhale 1 puff into the lungs daily as needed (shortness of breath). 05/10/17   [provider]  gabapentin (NEURONTIN) 300 MG capsule Take 600 mg by mouth 3 (three) times daily.    [provider]  losartan (COZAAR) 25 MG tablet TAKE 1 TABLET (25 MG TOTAL) BY MOUTH DAILY. 08/12/23   Quintella Reichert, MD  Multiple Vitamin (MULTIVITAMIN WITH MINERALS) TABS tablet Take 1 tablet by mouth daily.    [provider]  nitroGLYCERIN (NITROSTAT) 0.4 MG SL tablet May take 1 tablet every 5 min for 15 min 02/09/23   Chandrasekhar, Mahesh A, MD   Omega-3 Fatty Acids (OMEGA-3 FISH OIL) 300 MG CAPS Take 300 mg by mouth daily.    [provider]  omeprazole (PRILOSEC OTC) 20 MG tablet Take 20 mg by mouth daily as needed (for heartburn).    [provider]  Polyvinyl Alcohol-Povidone (REFRESH OP) Place 1 drop into both eyes daily as needed (dry eyes).    [provider]  ranolazine (RANEXA) 500 MG 12 hr tablet Take 1 tablet (500 mg total) by mouth 2 (two) times daily. 02/09/23   Christell Constant, MD  spironolactone (ALDACTONE) 50 MG tablet Take 1 tablet (50 mg total) by mouth daily. 04/07/23   Christell Constant, MD     Family History  Problem Relation Age of Onset   Heart attack Mother    Heart disease Mother    Heart attack Brother    Heart disease Brother    Heart attack Sister  Heart disease Sister     Social History   Socioeconomic History   Marital status: Single    Spouse name: Not on file   Number of children: Not on file   Years of education: Not on file   Highest education level: Not on file  Occupational History   Not on file  Tobacco Use   Smoking status: Former    Current packs/day: 0.00    Types: Cigarettes    Quit date: 07/13/2004    Years since quitting: 19.2   Smokeless tobacco: Never  Vaping Use   Vaping status: Never Used  Substance and Sexual Activity   Alcohol use: Yes    Comment: ocassionally    Drug use: No   Sexual activity: Not on file  Other Topics Concern   Not on file  Social History Narrative   Not on file   Social Drivers of Health   Financial Resource Strain: Not on file  Food Insecurity: Not on file  Transportation Needs: Not on file  Physical Activity: Not on file  Stress: Not on file  Social Connections: Not on file    Review of Systems: A 12 point ROS discussed and pertinent positives are indicated in the HPI above.  All other systems are negative.  Review of Systems  Vital Signs: There were no vitals taken for this  visit.  Advance Care Plan: The advanced care plan/surrogate decision maker was discussed at the time of visit and documented in the medical record.    Physical Exam  Imaging: US ARTERIAL LOWER EXTREMITY DUPLEX LEFT (NON-ABI) Result Date: 09/23/2023 CLINICAL DATA:  83 year old female with history of left lower extremity pain, history of peripheral artery disease. EXAM: LEFT LOWER EXTREMITY ARTERIAL DUPLEX SCAN TECHNIQUE: Gray-scale sonography as well as color Doppler and duplex ultrasound was performed to evaluate the lower extremity arteries including the common, superficial and profunda femoral arteries, popliteal artery and calf arteries. COMPARISON:  None Available. FINDINGS: Left lower Extremity ABI: 0.6 Inflow: Normal common femoral arterial waveforms and velocities. No evidence of inflow (aortoiliac) disease. Outflow: Biphasic waveforms and normal flow velocities in the proximal and mid superficial femoral artery. Monophasic waveforms and significantly decreased flow velocities in the distal superficial femoral artery. Monophasic waveforms and mildly diminished flow velocities in the popliteal artery. The profundal demonstrates normal flow velocity and biphasic waveforms. Runoff: Monophasic waveforms throughout. Vessels are patent to the ankle. IMPRESSION: Severe left lower extremity arterial occlusive disease with hemodynamically significant stenosis in the distal superficial femoral artery. Marliss Coots, MD [facility] Electronically Signed   By: Marliss Coots M.D.   On: 09/23/2023 10:21   [place] UPPER EXTREMITY DUPLEX LEFT (NON-WBI) Result Date: 09/22/2023 CLINICAL DATA:  83 year old female with left arm pain EXAM: LEFT UPPER EXTREMITY ARTERIAL DUPLEX SCAN TECHNIQUE: Gray-scale sonography as well as color Doppler and duplex ultrasound was performed to evaluate the arteries of the upper extremity. COMPARISON:  None FINDINGS: Directed duplex  of the left upper extremity demonstrates no elevated velocities. Triphasic waveforms throughout. The wrist brachial index was not performed IMPRESSION: Directed duplex of the left lower extremity demonstrates arterial waveforms maintained throughout Signed, Yvone Neu. Miachel Roux, RPVI [facility] Radiology Electronically Signed   By: Gilmer Mor D.O.   On: 09/22/2023 15:57    Labs:  CBC: Recent Labs    12/31/22 1514  WBC 7.0  HGB 12.2  HCT 37.8  PLT 157    COAGS: No  results for input(s): "INR", "APTT" in the last 8760 hours.  BMP: Recent Labs    12/31/22 1514  NA 134  K 5.1  CL 98  CO2 19*  GLUCOSE 89  BUN 20  CALCIUM 9.8  CREATININE 1.06*    LIVER FUNCTION TESTS: No results for input(s): "BILITOT", "AST", "ALT", "ALKPHOS", "PROT", "ALBUMIN" in the last 8760 hours.  TUMOR MARKERS: No results for input(s): "AFPTM", "CEA", "CA199", "CHROMGRNA" in the last 8760 hours.  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   Thank you for this interesting consult.  I greatly enjoyed meeting [name] and look forward to participating in their care.  A copy of this report was sent to the requesting provider on this date.  Electronically Signed: Mickie Kay, NP 09/24/2023, 4:23 PM   I spent a total of  30 Minutes   in face to face in clinical consultation, greater than 50% of which was counseling/coordinating care for left lower extremity arterial occlusive disease.

## 2023-09-27 ENCOUNTER — Inpatient Hospital Stay
Admission: RE | Admit: 2023-09-27 | Discharge: 2023-09-27 | Disposition: A | Discharge status: Home / Self Care | Source: Ambulatory Visit | Attending: Internal Medicine | Admitting: Internal Medicine

## 2023-09-28 ENCOUNTER — Other Ambulatory Visit: Payer: Self-pay | Admitting: Cardiology

## 2023-09-29 DIAGNOSIS — Z89611 Acquired absence of right leg above knee: Secondary | ICD-10-CM | POA: Diagnosis not present

## 2023-09-29 DIAGNOSIS — I739 Peripheral vascular disease, unspecified: Secondary | ICD-10-CM | POA: Diagnosis not present

## 2023-10-16 NOTE — Progress Notes (Signed)
 Chief Complaint: Patient was seen in consultation today for left lower extremity arterial occlusion  Referring Physician(s): Pahwani,Rinka R  History of Present Illness: [name] is an 83 y.o. female with a medical history significant for CAD, NSTEMI, Takotsubo cardiomyopathy, CKD, carotid stenosis, HTN, cerebral aneurysm, PAD/PVD and right AKA. She was recently evaluated by her PCP and she reported left upper and left lower extremity pain. She also complained of left leg swelling and orthopnea. These symptoms had been worsening over several months. She had similar symptoms in her right leg before it was amputated. Ultrasound studies of the left arm and left leg were ordered and completed 09/21/23. 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  The patient has been kindly referred to Interventional Radiology to discuss possible treatment options for her left lower extremity occlusive disease.   Past Medical History:  Diagnosis Date   CAD in native artery    a. 04/2016 NSTEMI with cath showing 25% MCx, 75% DX1 lesion and moderate LVD 35-45% out of proportion to CAD and in a pattern of Takotsubo CM (no clear stressor)   Carotid stenosis    Carotid Doppler showed 40 to 59% stenosis of the right carotid artery and less than 50% stenosis of the right common carotid artery.  There is 1 to 39% stenosis of the left internal carotid artery and greater than 50% stenosis of the common carotid artery on the left.  There is also left subclavian artery stenosis. by dopplers 05/2021   Dyslipidemia    GERD (gastroesophageal reflux disease)    HOCM (hypertrophic obstructive cardiomyopathy) (HCC)    Hypercholesteremia    Hypertension    Hypertensive heart disease    initially she was thought to have a HOCM variant  by cath with increased LVOT gradient but echo showed only a gradient across the LVOT and SAM and cardiac MRI did not show any late gad enhancement and only a LVOT gradient with 15mm BSH.  There was some mild SAM and mild MR.  EF normal at 76%.     LVH (left ventricular hypertrophy)    a. cMRI 2018 - showing moderate Basal septal hypertrophy not classic for HOCM morphology with no delayed gadolium uptake to suggest myofibrillar disarray, + SAM with small LVOT gradient in regard to physiology and mild MR.   Mild aortic stenosis    NSTEMI (non-ST elevated myocardial infarction) (HCC) 04/2016   secondary to stress CM - Takotsubo CM   PAC (premature atrial contraction)    Event monitors 10/2013 and 08/2014 showed NSR, ST, PACs, PVCs.    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   CARDIAC CATHETERIZATION N/A 04/21/2016   Procedure: Left Heart Cath and Coronary Angiography;  Surgeon: Corky Crafts, MD;  Location: [facility] INVASIVE CV LAB;  Service: Cardiovascular;  Laterality: N/A;   IR  GENERIC HISTORICAL  02/13/2016   IR RADIOLOGY PERIPHERAL GUIDED IV START 02/13/2016 Berdine Dance, MD [facility]   IR GENERIC HISTORICAL  02/13/2016   IR US GUIDE VASC ACCESS LEFT 02/13/2016 Berdine Dance, MD [facility]   LEFT HEART CATH AND CORONARY ANGIOGRAPHY N/A 01/06/2023   Procedure: LEFT HEART CATH AND CORONARY ANGIOGRAPHY;  Surgeon: Kathleene Hazel, MD;  Location: [facility] INVASIVE CV LAB;  Service: Cardiovascular;  Laterality: N/A;    Allergies: Ace  inhibitors, Codeine, and Penicillins  Medications: Prior to Admission medications   Medication Sig Start Date End Date Taking? Authorizing Provider  acetaminophen (TYLENOL) 325 MG tablet Take 2 tablets (650 mg total) by mouth every 4 (four) hours as needed for headache or mild pain. 04/23/16   Abelino Derrick, PA-C  alendronate (FOSAMAX) 70 MG tablet Take 70 mg by mouth every Monday.    [provider]  aspirin EC 81 MG EC tablet Take 1 tablet (81 mg total) by mouth daily. 04/23/16   Abelino Derrick, PA-C  atorvastatin (LIPITOR) 80 MG tablet TAKE 1 TABLET BY MOUTH EVERY DAY 09/29/23   Quintella Reichert, MD  Calcium Carbonate-Vitamin D (CALCIUM-D PO) Take 1 tablet by mouth daily.    [provider]  carvedilol (COREG) 12.5 MG tablet TAKE 1 TABLET BY MOUTH TWICE A DAY 04/22/23   Chandrasekhar, Mahesh A, MD  Cholecalciferol (VITAMIN D) 125 MCG (5000 UT) CAPS Take 5,000 Units by mouth daily.    [provider]  ezetimibe (ZETIA) 10 MG tablet TAKE 1 TABLET BY MOUTH EVERY DAY 05/14/23   Quintella Reichert, MD  ferrous sulfate 325 (65 FE) MG EC tablet Take 325 mg by mouth 3 (three) times a week.    [provider]  fluticasone (FLONASE) 50 MCG/ACT nasal spray Place 1 spray into both nostrils daily as needed (congestion).  05/10/17   [provider]  fluticasone (FLOVENT HFA) 110 MCG/ACT inhaler Inhale 1 puff into the lungs daily as needed (shortness of breath). 05/10/17   [provider]  gabapentin (NEURONTIN) 300 MG capsule Take 600 mg by mouth 3 (three) times daily.    [provider]  losartan (COZAAR) 25 MG tablet TAKE 1 TABLET (25 MG TOTAL) BY MOUTH DAILY. 08/12/23   Quintella Reichert, MD  Multiple Vitamin (MULTIVITAMIN WITH MINERALS) TABS tablet Take 1 tablet by mouth daily.    [provider]  nitroGLYCERIN (NITROSTAT) 0.4 MG SL tablet May take 1 tablet every 5 min for 15 min 02/09/23   Chandrasekhar, Mahesh A, MD  Omega-3 Fatty Acids  (OMEGA-3 FISH OIL) 300 MG CAPS Take 300 mg by mouth daily.    [provider]  omeprazole (PRILOSEC OTC) 20 MG tablet Take 20 mg by mouth daily as needed (for heartburn).    [provider]  Polyvinyl Alcohol-Povidone (REFRESH OP) Place 1 drop into both eyes daily as needed (dry eyes).    [provider]  ranolazine (RANEXA) 500 MG 12 hr tablet Take 1 tablet (500 mg total) by mouth 2 (two) times daily. 02/09/23   Christell Constant, MD  spironolactone (ALDACTONE) 50 MG tablet Take 1 tablet (50 mg total) by mouth daily. 04/07/23   Christell Constant, MD     Family History  Problem Relation Age of Onset   Heart attack Mother    Heart disease Mother    Heart attack Brother    Heart disease Brother    Heart attack Cindy    Heart  disease Cindy     Social History   Socioeconomic History   Marital status: Single    Spouse name: Not on file   Number of children: Not on file   Years of education: Not on file   Highest education level: Not on file  Occupational History   Not on file  Tobacco Use   Smoking status: Former    Current packs/day: 0.00    Types: Cigarettes    Quit date: 07/13/2004    Years since quitting: 19.2   Smokeless tobacco: Never  Vaping Use   Vaping status: Never Used  Substance and Sexual Activity   Alcohol use: Yes    Comment: ocassionally    Drug use: No   Sexual activity: Not on file  Other Topics Concern   Not on file  Social History Narrative   Not on file   Social Drivers of Health   Financial Resource Strain: Not on file  Food Insecurity: Not on file  Transportation Needs: Not on file  Physical Activity: Not on file  Stress: Not on file  Social Connections: Not on file    Review of Systems: A 12 point ROS discussed and pertinent positives are indicated in the HPI above.  All other systems are negative.  Review of Systems  Vital Signs: There were no vitals taken for this visit.  Advance Care Plan: The  advanced care plan/surrogate decision maker was discussed at the time of visit and documented in the medical record.    Physical Exam  Imaging:   Lower extremity duplex left 09/21/23    Labs:  CBC: Recent Labs    12/31/22 1514  WBC 7.0  HGB 12.2  HCT 37.8  PLT 157    COAGS: No results for input(s): "INR", "APTT" in the last 8760 hours.  BMP: Recent Labs    12/31/22 1514  NA 134  K 5.1  CL 98  CO2 19*  GLUCOSE 89  BUN 20  CALCIUM 9.8  CREATININE 1.06*    LIVER FUNCTION TESTS: No results for input(s): "BILITOT", "AST", "ALT", "ALKPHOS", "PROT", "ALBUMIN" in the last 8760 hours.  TUMOR MARKERS: No results for input(s): "AFPTM", "CEA", "CA199", "CHROMGRNA" in the last 8760 hours.  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   Thank you for this interesting consult.  I greatly enjoyed meeting CHERRILL SCRIMA and look forward to participating in their care.  A copy of this report was sent to the requesting provider on this date.  Electronically Signed: Mickie Kay, NP 10/16/2023, 8:47 AM   I spent a total of  40 Minutes   in face to face in clinical consultation, greater than 50% of which was counseling/coordinating care for left lower extremity arterial occlusion.

## 2023-10-18 ENCOUNTER — Ambulatory Visit
Admission: RE | Admit: 2023-10-18 | Discharge: 2023-10-18 | Disposition: A | Discharge status: Home / Self Care | Source: Ambulatory Visit | Attending: Internal Medicine

## 2023-10-18 ENCOUNTER — Other Ambulatory Visit: Payer: Self-pay | Admitting: Interventional Radiology

## 2023-10-18 DIAGNOSIS — I70202 Unspecified atherosclerosis of native arteries of extremities, left leg: Secondary | ICD-10-CM | POA: Diagnosis not present

## 2023-10-18 DIAGNOSIS — I739 Peripheral vascular disease, unspecified: Secondary | ICD-10-CM

## 2023-10-18 DIAGNOSIS — I70209 Unspecified atherosclerosis of native arteries of extremities, unspecified extremity: Secondary | ICD-10-CM

## 2023-10-18 HISTORY — PX: IR RADIOLOGIST EVAL & MGMT: IMG5224

## 2023-10-23 ENCOUNTER — Other Ambulatory Visit: Payer: Self-pay | Admitting: Cardiology

## 2023-10-29 ENCOUNTER — Other Ambulatory Visit

## 2023-10-29 ENCOUNTER — Ambulatory Visit
Admission: RE | Admit: 2023-10-29 | Discharge: 2023-10-29 | Disposition: A | Discharge status: Home / Self Care | Source: Ambulatory Visit | Attending: Interventional Radiology | Admitting: Interventional Radiology

## 2023-10-29 DIAGNOSIS — I739 Peripheral vascular disease, unspecified: Secondary | ICD-10-CM

## 2023-10-29 DIAGNOSIS — I701 Atherosclerosis of renal artery: Secondary | ICD-10-CM | POA: Diagnosis not present

## 2023-10-29 DIAGNOSIS — I708 Atherosclerosis of other arteries: Secondary | ICD-10-CM | POA: Diagnosis not present

## 2023-10-29 DIAGNOSIS — I700 Atherosclerosis of aorta: Secondary | ICD-10-CM | POA: Diagnosis not present

## 2023-10-29 DIAGNOSIS — I70203 Unspecified atherosclerosis of native arteries of extremities, bilateral legs: Secondary | ICD-10-CM | POA: Diagnosis not present

## 2023-10-29 MED ORDER — IOPAMIDOL (ISOVUE-370) INJECTION 76%
500.0000 mL | Freq: Once | INTRAVENOUS | Status: AC | PRN
  Administered 2023-10-29: 100 mL via INTRAVENOUS

## 2023-10-30 DIAGNOSIS — Z89611 Acquired absence of right leg above knee: Secondary | ICD-10-CM | POA: Diagnosis not present

## 2023-10-30 DIAGNOSIS — I739 Peripheral vascular disease, unspecified: Secondary | ICD-10-CM | POA: Diagnosis not present

## 2023-11-06 NOTE — Progress Notes (Signed)
 This encounter was conducted via the [employer] providing interactive audio and visual communication.  The patient provided verbal consent to conduct a virtual appointment.  The patient was located at their primary residence during this encounter.  Referring Physician(s): Pahwani, Rinka R   Chief Complaint: The patient is seen in virtual video consultation today for left lower extremity arterial occlusion.   History of present illness: HPI from initial consultation 10/18/23 [name] is an 83 y.o. female with a medical history significant for CAD, NSTEMI, Takotsubo cardiomyopathy, CKD, carotid stenosis, HTN, cerebral aneurysm, PAD/PVD and right AKA. She was recently evaluated by her PCP and she reported left upper and left lower extremity pain. She also complained of left leg swelling and orthopnea. These symptoms had been worsening over several months. She had similar symptoms in her right leg before it was amputated. Ultrasound studies of the left arm and left leg were ordered and completed 09/21/23. 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   The patient has been kindly referred to Interventional Radiology to discuss possible treatment options for her left lower extremity occlusive disease. Today she complains primarily of crampy pain in her left thigh and calf. This is worsened with ambulation and she reports a sensation of pins and needles, which resolves with rest. She denies any prior non-healing wounds on the left lower extremity. She states that the sensation in her left leg feels very similar to the right prior to her amputation. She is optimized on atherosclerotic risk factor minimization currently on 80 mg atorvastatin  and 10 mg ezetimibe  daily, 81 mg ASA daily, and on 3  antihypertensives.  She would likely benefit from left lower extremity angiogram and possible intervention which we discussed broadly. We discussed obtaining a CTA runoff of the left lower extremity for further assessment. This study was completed 10/29/23 and she presents today via virtual video visit to discuss these results. No changes in her symptoms since her last visit.  She is accompanied today by her son.  Past Medical History:  Diagnosis Date   CAD in native artery    a. 04/2016 NSTEMI with cath showing 25% MCx, 75% DX1 lesion and moderate LVD 35-45% out of proportion to CAD and in a pattern of Takotsubo CM (no clear stressor)   Carotid stenosis    Carotid Doppler showed 40 to 59% stenosis of the right carotid artery and less than 50% stenosis of the right common carotid artery.  There is 1 to 39% stenosis of the left internal carotid artery and greater than 50% stenosis of the common carotid artery on the left.  There is also left subclavian artery stenosis. by dopplers 05/2021   Dyslipidemia    GERD (gastroesophageal reflux disease)    HOCM (hypertrophic obstructive cardiomyopathy) (HCC)    Hypercholesteremia    Hypertension    Hypertensive heart disease    initially she was thought to have a HOCM variant by cath with increased LVOT gradient but echo showed only a gradient across the LVOT and SAM and cardiac MRI did not show any late gad enhancement and only a LVOT gradient with 15mm BSH.  There was some mild SAM and mild MR.  EF normal at 76%.     LVH (left ventricular hypertrophy)    a. cMRI 2018 - showing moderate Basal septal hypertrophy  not classic for HOCM morphology with no delayed gadolium uptake to suggest myofibrillar disarray, + SAM with small LVOT gradient in regard to physiology and mild MR.   Mild aortic stenosis    NSTEMI (non-ST elevated myocardial infarction) (HCC) 04/2016   secondary to stress CM - Takotsubo CM   PAC (premature atrial contraction)     Event monitors 10/2013 and 08/2014 showed NSR, ST, PACs, PVCs.    PAD (peripheral artery disease) (HCC) 11/2005   popliteal bypass failed S/P R BKA, converted to AKA 11/2006   Posterior communicating artery aneurysm    followed by Dr Nigel Bart   PVC's (premature ventricular contractions)    Event monitors 10/2013 and 08/2014 showed NSR, ST, PACs, PVCs.    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   CARDIAC CATHETERIZATION N/A 04/21/2016   Procedure: Left Heart Cath and Coronary Angiography;  Surgeon: Lucendia Rusk, MD;  Location: [facility] INVASIVE CV LAB;  Service: Cardiovascular;  Laterality: N/A;   IR GENERIC HISTORICAL  02/13/2016   IR RADIOLOGY PERIPHERAL GUIDED IV START 02/13/2016 Lucinda Saber, MD [facility]-INTERV RAD   IR GENERIC HISTORICAL  02/13/2016   IR US  GUIDE VASC ACCESS LEFT 02/13/2016 Lucinda Saber, MD [facility]INTER[facility]   IR RADIOLOGIST EVAL & MGMT  10/18/2023   LEFT HEART CATH AND CORONARY ANGIOGRAPHY N/A 01/06/2023   Procedure: LEFT HEART CATH AND CORONARY ANGIOGRAPHY;  Surgeon: Odie Benne, MD;  Location: [facility] INVASIVE CV LAB;  Service: Cardiovascular;  Laterality: N/A;    Allergies: Ace inhibitors, Codeine, and Penicillins  Medications: Prior to Admission medications   Medication Sig Start Date End Date Taking? Authorizing Provider  acetaminophen  (TYLENOL ) 325 MG tablet Take 2 tablets (650 mg total) by mouth every 4 (four) hours as needed for headache or mild pain. 04/23/16   Kilroy, Luke K, PA-C  alendronate (FOSAMAX) 70 MG tablet Take 70 mg by mouth every Monday.    [provider]  aspirin  EC 81 MG EC tablet Take 1 tablet (81 mg total) by mouth daily. 04/23/16   Abel Hoe, PA-C  atorvastatin  (LIPITOR ) 80 MG tablet TAKE 1 TABLET BY MOUTH EVERY DAY 10/26/23   Jacqueline Matsu, MD  Calcium  Carbonate-Vitamin D  (CALCIUM -D PO) Take 1 tablet by mouth daily.    [provider]  carvedilol  (COREG ) 12.5 MG tablet TAKE 1 TABLET BY MOUTH TWICE A DAY 04/22/23   Chandrasekhar, Mahesh A, MD  Cholecalciferol (VITAMIN D) 125 MCG (5000 UT) CAPS Take 5,000 Units by mouth daily.    [provider]  ezetimibe  (ZETIA ) 10 MG tablet TAKE 1 TABLET BY MOUTH EVERY DAY 05/14/23   Jacqueline Matsu, MD  ferrous sulfate 325 (65 FE) MG EC tablet Take 325 mg by mouth 3 (three) times a week.    [provider]  fluticasone (FLONASE) 50 MCG/ACT nasal spray Place 1 spray into both nostrils daily as needed (congestion).  05/10/17   [provider]  fluticasone (FLOVENT HFA) 110 MCG/ACT inhaler Inhale 1 puff into the lungs daily as needed (shortness of breath). 05/10/17   [provider]  gabapentin  (NEURONTIN ) 300 MG capsule Take 600 mg by mouth 3 (three) times daily.    [provider]  losartan  (COZAAR ) 25 MG tablet TAKE 1 TABLET (25 MG TOTAL) BY MOUTH DAILY. 08/12/23  Jacqueline Matsu, MD  Multiple Vitamin (MULTIVITAMIN WITH MINERALS) TABS tablet Take 1 tablet by mouth daily.    [provider]  nitroGLYCERIN  (NITROSTAT ) 0.4 MG SL tablet May take 1 tablet every 5 min for 15 min 02/09/23   Chandrasekhar, Mahesh A, MD  Omega-3 Fatty Acids (OMEGA-3 FISH OIL) 300 MG CAPS Take 300 mg by mouth daily.    [provider]  omeprazole (PRILOSEC OTC) 20 MG tablet Take 20 mg by mouth daily as needed (for heartburn).    [provider]  Polyvinyl Alcohol-Povidone (REFRESH OP) Place 1 drop into both eyes daily as needed (dry eyes).    [provider]  ranolazine  (RANEXA ) 500 MG 12 hr tablet Take 1 tablet (500 mg total) by mouth 2 (two) times daily. 02/09/23   Chandrasekhar, Caretha Chapel, MD  spironolactone  (ALDACTONE ) 50 MG tablet Take 1 tablet (50 mg total) by mouth daily. 04/07/23   Jann Melody, MD     Family History  Problem Relation Age of Onset    Heart attack Mother    Heart disease Mother    Heart attack Brother    Heart disease Brother    Heart attack Sister    Heart disease Sister     Social History   Socioeconomic History   Marital status: Single    Spouse name: Not on file   Number of children: Not on file   Years of education: Not on file   Highest education level: Not on file  Occupational History   Not on file  Tobacco Use   Smoking status: Former    Current packs/day: 0.00    Types: Cigarettes    Quit date: 07/13/2004    Years since quitting: 19.3   Smokeless tobacco: Never  Vaping Use   Vaping status: Never Used  Substance and Sexual Activity   Alcohol use: Yes    Comment: ocassionally    Drug use: No   Sexual activity: Not on file  Other Topics Concern   Not on file  Social History Narrative   Not on file   Social Drivers of Health   Financial Resource Strain: Not on file  Food Insecurity: Not on file  Transportation Needs: Not on file  Physical Activity: Not on file  Stress: Not on file  Social Connections: Not on file     Vital Signs: There were no vitals taken for this visit.  Physical Exam  Patient is alert, oriented and able to participate fully in the conversation. No apparent discomfort or distress observed. She appears appropriately dressed.    Imaging: Lower extremity duplex left 09/21/23    FINDINGS: Left lower Extremity  ABI: 0.6  Inflow: Normal common femoral arterial waveforms and velocities. No evidence of inflow (aortoiliac) disease.  Outflow: Biphasic waveforms and normal flow velocities in the proximal and mid superficial femoral artery. Monophasic waveforms and significantly decreased flow velocities in the distal superficial femoral artery. Monophasic waveforms and mildly diminished flow velocities in the popliteal artery. The profundal demonstrates normal flow velocity and biphasic waveforms.  Runoff: Monophasic waveforms throughout. Vessels are patent to  the ankle.  IMPRESSION: Severe left lower extremity arterial occlusive disease with hemodynamically significant stenosis in the distal superficial femoral artery.  CTA Runoff   ~10 cm occlusion of the mid left SFA  IMPRESSION: VASCULAR  1. Predominantly femoropopliteal disease on the left with high-grade stenosis beginning in the distal common femoral artery and extending into the origin of the superficial femoral artery  which is diffusely diseased in the upper thigh before occluding completely in the mid thigh. Reconstitution as the artery leaves Hunter's canal. The popliteal artery is diseased and diminutive but this appears to be predominantly due to the proximal occlusion rather than intrinsic disease. Patent 3 vessel runoff to the ankle. 2. Mild iliac artery disease with mild stenosis of the proximal left common iliac artery. 3. On the right, there is chronic occlusive disease as described above. Patient is status post above the knee amputation. 4. Variant celiac artery anatomy as above. 5. Mild to moderate stenosis of the proximal SMA. 6. Moderate stenosis of the left renal artery. 7. Small caliber aorta with diffuse atherosclerotic plaque. Aortic Atherosclerosis (ICD10-I70.0).    Labs:  CBC: Recent Labs    12/31/22 1514  WBC 7.0  HGB 12.2  HCT 37.8  PLT 157    COAGS: No results for input(s): "INR", "APTT" in the last 8760 hours.  BMP: Recent Labs    12/31/22 1514  NA 134  K 5.1  CL 98  CO2 19*  GLUCOSE 89  BUN 20  CALCIUM  9.8  CREATININE 1.06*    LIVER FUNCTION TESTS: No results for input(s): "BILITOT", "AST", "ALT", "ALKPHOS", "PROT", "ALBUMIN" in the last 8760 hours.  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  She is optimized from a medical standpoint and would likely benefit from left lower extremity angiogram and possible intervention.   Plan for left lower extremity angiogram and possible recanalization including possible angioplasty, possible atherectomy, and possible stent placement with moderate sedation at [facility].  Plan for antegrade left common femoral artery access, possible retrograde pedal access.  Creasie Doctor, MD Pager: [phone]    I spent a total of 25 Minutes in virtual video clinical consultation, greater than 50% of which was counseling/coordinating care for left lower extremity arterial occlusion.

## 2023-11-08 ENCOUNTER — Ambulatory Visit
Admission: RE | Admit: 2023-11-08 | Discharge: 2023-11-08 | Disposition: A | Discharge status: Home / Self Care | Source: Ambulatory Visit | Attending: Interventional Radiology | Admitting: Interventional Radiology

## 2023-11-08 DIAGNOSIS — I739 Peripheral vascular disease, unspecified: Secondary | ICD-10-CM | POA: Diagnosis not present

## 2023-11-08 HISTORY — PX: IR RADIOLOGIST EVAL & MGMT: IMG5224

## 2023-11-09 ENCOUNTER — Encounter: Payer: Self-pay | Admitting: Interventional Radiology

## 2023-11-17 DIAGNOSIS — N1831 Chronic kidney disease, stage 3a: Secondary | ICD-10-CM | POA: Diagnosis not present

## 2023-11-17 DIAGNOSIS — I2510 Atherosclerotic heart disease of native coronary artery without angina pectoris: Secondary | ICD-10-CM | POA: Diagnosis not present

## 2023-11-17 DIAGNOSIS — I10 Essential (primary) hypertension: Secondary | ICD-10-CM | POA: Diagnosis not present

## 2023-11-17 DIAGNOSIS — R413 Other amnesia: Secondary | ICD-10-CM | POA: Diagnosis not present

## 2023-11-17 DIAGNOSIS — R300 Dysuria: Secondary | ICD-10-CM | POA: Diagnosis not present

## 2023-11-17 DIAGNOSIS — I421 Obstructive hypertrophic cardiomyopathy: Secondary | ICD-10-CM | POA: Diagnosis not present

## 2023-11-17 DIAGNOSIS — I739 Peripheral vascular disease, unspecified: Secondary | ICD-10-CM | POA: Diagnosis not present

## 2023-11-18 ENCOUNTER — Encounter: Payer: MEDICARE | Attending: Internal Medicine | Primary: Internal Medicine

## 2023-11-19 ENCOUNTER — Other Ambulatory Visit (HOSPITAL_COMMUNITY): Payer: Self-pay | Admitting: Interventional Radiology

## 2023-11-19 ENCOUNTER — Telehealth (HOSPITAL_COMMUNITY): Payer: Self-pay | Admitting: Interventional Radiology

## 2023-11-19 DIAGNOSIS — I739 Peripheral vascular disease, unspecified: Secondary | ICD-10-CM

## 2023-11-19 NOTE — Telephone Encounter (Signed)
 Called pt's son to see if they could make 12/01/23 work for his mom's LLE angio/intervention with Dr. Jinx Mourning at [facility]. Asked for him to call me back. JM

## 2023-11-23 DIAGNOSIS — R413 Other amnesia: Secondary | ICD-10-CM | POA: Diagnosis not present

## 2023-11-29 DIAGNOSIS — Z89611 Acquired absence of right leg above knee: Secondary | ICD-10-CM | POA: Diagnosis not present

## 2023-11-29 DIAGNOSIS — I739 Peripheral vascular disease, unspecified: Secondary | ICD-10-CM | POA: Diagnosis not present

## 2023-11-30 ENCOUNTER — Other Ambulatory Visit: Payer: Self-pay | Admitting: Radiology

## 2023-11-30 DIAGNOSIS — I739 Peripheral vascular disease, unspecified: Secondary | ICD-10-CM

## 2023-12-01 ENCOUNTER — Other Ambulatory Visit: Payer: Self-pay

## 2023-12-01 ENCOUNTER — Other Ambulatory Visit (HOSPITAL_COMMUNITY): Payer: Self-pay | Admitting: Interventional Radiology

## 2023-12-01 ENCOUNTER — Ambulatory Visit (HOSPITAL_COMMUNITY)
Admission: RE | Admit: 2023-12-01 | Discharge: 2023-12-01 | Disposition: A | Discharge status: Home / Self Care | Source: Ambulatory Visit | Attending: Interventional Radiology | Admitting: Interventional Radiology

## 2023-12-01 ENCOUNTER — Encounter (HOSPITAL_COMMUNITY): Payer: Self-pay

## 2023-12-01 DIAGNOSIS — Z89611 Acquired absence of right leg above knee: Secondary | ICD-10-CM | POA: Diagnosis not present

## 2023-12-01 DIAGNOSIS — I739 Peripheral vascular disease, unspecified: Secondary | ICD-10-CM

## 2023-12-01 DIAGNOSIS — I70221 Atherosclerosis of native arteries of extremities with rest pain, right leg: Secondary | ICD-10-CM | POA: Insufficient documentation

## 2023-12-01 DIAGNOSIS — Z87891 Personal history of nicotine dependence: Secondary | ICD-10-CM | POA: Insufficient documentation

## 2023-12-01 HISTORY — PX: IR ANGIOGRAM EXTREMITY LEFT: IMG651

## 2023-12-01 LAB — CBC WITH DIFFERENTIAL/PLATELET
Abs Immature Granulocytes: 0.02 10*3/uL (ref 0.00–0.07)
Basophils Absolute: 0.1 10*3/uL (ref 0.0–0.1)
Basophils Relative: 1 %
Eosinophils Absolute: 0.3 10*3/uL (ref 0.0–0.5)
Eosinophils Relative: 3 %
HCT: 35.5 % — ABNORMAL LOW (ref 36.0–46.0)
Hemoglobin: 11.1 g/dL — ABNORMAL LOW (ref 12.0–15.0)
Immature Granulocytes: 0 %
Lymphocytes Relative: 38 %
Lymphs Abs: 2.8 10*3/uL (ref 0.7–4.0)
MCH: 30.1 pg (ref 26.0–34.0)
MCHC: 31.3 g/dL (ref 30.0–36.0)
MCV: 96.2 fL (ref 80.0–100.0)
Monocytes Absolute: 0.9 10*3/uL (ref 0.1–1.0)
Monocytes Relative: 13 %
Neutro Abs: 3.3 10*3/uL (ref 1.7–7.7)
Neutrophils Relative %: 45 %
Platelets: 134 10*3/uL — ABNORMAL LOW (ref 150–400)
RBC: 3.69 MIL/uL — ABNORMAL LOW (ref 3.87–5.11)
RDW: 15.1 % (ref 11.5–15.5)
WBC: 7.4 10*3/uL (ref 4.0–10.5)
nRBC: 0 % (ref 0.0–0.2)

## 2023-12-01 LAB — BASIC METABOLIC PANEL WITH GFR
Anion gap: 6 (ref 5–15)
BUN: 18 mg/dL (ref 8–23)
CO2: 23 mmol/L (ref 22–32)
Calcium: 9.4 mg/dL (ref 8.9–10.3)
Chloride: 107 mmol/L (ref 98–111)
Creatinine, Ser: 1.09 mg/dL — ABNORMAL HIGH (ref 0.44–1.00)
GFR, Estimated: 50 mL/min — ABNORMAL LOW (ref >60)
Glucose, Bld: 90 mg/dL (ref 70–99)
Potassium: 5.2 mmol/L — ABNORMAL HIGH (ref 3.5–5.1)
Sodium: 136 mmol/L (ref 135–145)

## 2023-12-01 LAB — PROTIME-INR
INR: 1.1 (ref 0.8–1.2)
Prothrombin Time: 14.3 s (ref 11.4–15.2)

## 2023-12-01 MED ORDER — FENTANYL CITRATE (PF) 100 MCG/2ML IJ SOLN
INTRAMUSCULAR | Status: AC | PRN
Start: 2023-12-01 — End: 2023-12-01
  Administered 2023-12-01: 25 ug via INTRAVENOUS

## 2023-12-01 MED ORDER — NITROGLYCERIN 2 % TD OINT
0.5000 [in_us] | TOPICAL_OINTMENT | Freq: Once | TRANSDERMAL | Status: DC
  Filled 2023-12-01: qty 30

## 2023-12-01 MED ORDER — MIDAZOLAM HCL 2 MG/2ML IJ SOLN
INTRAMUSCULAR | Status: AC
  Filled 2023-12-01: qty 2

## 2023-12-01 MED ORDER — SODIUM CHLORIDE 0.9% FLUSH: 3.0000 mL | Freq: Two times a day (BID) | INTRAVENOUS | Status: DC

## 2023-12-01 MED ORDER — IODIXANOL 320 MG/ML IV SOLN
100.0000 mL | Freq: Once | INTRAVENOUS | Status: DC | PRN
Start: 2023-12-01 — End: 2023-12-02

## 2023-12-01 MED ORDER — HEPARIN SODIUM (PORCINE) 1000 UNIT/ML IJ SOLN
INTRAMUSCULAR | Status: AC
  Filled 2023-12-01: qty 10

## 2023-12-01 MED ORDER — NITROGLYCERIN 2 % TD OINT
0.5000 [in_us] | TOPICAL_OINTMENT | Freq: Once | TRANSDERMAL | Status: AC
  Administered 2023-12-01: 0.5 [in_us] via TOPICAL
  Filled 2023-12-01: qty 1

## 2023-12-01 MED ORDER — LIDOCAINE HCL 1 % IJ SOLN
INTRAMUSCULAR | Status: AC
Start: 2023-12-01 — End: ?
  Filled 2023-12-01: qty 20

## 2023-12-01 MED ORDER — FENTANYL CITRATE (PF) 100 MCG/2ML IJ SOLN
INTRAMUSCULAR | Status: AC
  Filled 2023-12-01: qty 2

## 2023-12-01 MED ORDER — VERAPAMIL HCL 2.5 MG/ML IV SOLN
INTRAVENOUS | Status: AC
  Filled 2023-12-01: qty 2

## 2023-12-01 MED ORDER — NITROGLYCERIN 1 MG/10 ML FOR IR/CATH LAB
INTRA_ARTERIAL | Status: AC
  Filled 2023-12-01: qty 10

## 2023-12-01 MED ORDER — SODIUM CHLORIDE 0.9% FLUSH: 3.0000 mL | INTRAVENOUS | Status: DC | PRN

## 2023-12-01 MED ORDER — MIDAZOLAM HCL 2 MG/2ML IJ SOLN
INTRAMUSCULAR | Status: AC | PRN
  Administered 2023-12-01: .5 mg via INTRAVENOUS

## 2023-12-01 MED ORDER — IODIXANOL 320 MG/ML IV SOLN: 150.0000 mL | Freq: Once | INTRAVENOUS | Status: DC | PRN

## 2023-12-01 MED ORDER — LIDOCAINE-PRILOCAINE 2.5-2.5 % EX CREA
TOPICAL_CREAM | Freq: Once | CUTANEOUS | Status: AC
  Filled 2023-12-01 (×2): qty 5

## 2023-12-01 NOTE — Procedures (Signed)
 Interventional Radiology Procedure Note  Procedure:  Aborted left lower extremity angiogram  Findings: Please refer to procedural dictation for full description.  Left radial artery atretic, Barbeau D.  Atretic left PT and AT.  Severely stenosed left CFA.  No safe access points for angiogram/intervention.  Complications: None immediate  Estimated Blood Loss: None  Recommendations: Will discuss possible Vascular Surgery consultation.   Creasie Doctor, MD

## 2023-12-01 NOTE — H&P (Signed)
 Chief Complaint: Lower extremity arterial disease  Referring Provider(s): Pahwani, Rinka R   Supervising Physician: Creasie Doctor  Patient Status: [facility] - Out-pt  History of Present Illness: [name] is a 83 y.o. female  with a medical history significant for CAD, NSTEMI, Takotsubo cardiomyopathy, CKD, carotid stenosis, HTN, cerebral aneurysm, PAD/PVD and right AKA.   She was recently evaluated by her PCP and she reported left upper and left lower extremity pain.   She had similar symptoms in her right leg before it was amputated.   Arterial studies showed severe left lower extremity arterial occlusive disease with hemodynamically significant stenosis in the distal superficial femoral artery.  CTA done 10/31/23 showed= 1.Predominantly femoropopliteal disease on the left with high-grade stenosis beginning in the distal common femoral artery and extending into the origin of the superficial femoral artery which is diffusely diseased in the upper thigh before occluding completely in the mid thigh. Reconstitution as the artery leaves Hunter's canal. The popliteal artery is diseased and diminutive but this appears to be predominantly due to the proximal occlusion rather than intrinsic disease. Patent 3 vessel runoff to the ankle. 2. Mild iliac artery disease with mild stenosis of the proximal left common iliac artery. 3. On the right, there is chronic occlusive disease as described above. Patient is status post above the knee amputation.   She is here today for angiography with possible angioplasty.  She is NPO.  Patient is Full Code  Past Medical History:  Diagnosis Date   CAD in native artery    a. 04/2016 NSTEMI with cath showing 25% MCx, 75% DX1 lesion and moderate LVD 35-45% out of proportion to CAD and in a pattern of Takotsubo CM (no clear stressor)   Carotid stenosis    Carotid Doppler showed 40 to 59% stenosis of the right carotid artery and less than 50%  stenosis of the right common carotid artery.  There is 1 to 39% stenosis of the left internal carotid artery and greater than 50% stenosis of the common carotid artery on the left.  There is also left subclavian artery stenosis. by dopplers 05/2021   Dyslipidemia    GERD (gastroesophageal reflux disease)    HOCM (hypertrophic obstructive cardiomyopathy) (HCC)    Hypercholesteremia    Hypertension    Hypertensive heart disease    initially she was thought to have a HOCM variant by cath with increased LVOT gradient but echo showed only a gradient across the LVOT and SAM and cardiac MRI did not show any late gad enhancement and only a LVOT gradient with 15mm BSH.  There was some mild SAM and mild MR.  EF normal at 76%.     LVH (left ventricular hypertrophy)    a. cMRI 2018 - showing moderate Basal septal hypertrophy not classic for HOCM morphology with no delayed gadolium uptake to suggest myofibrillar disarray, + SAM with small LVOT gradient in regard to physiology and mild MR.   Mild aortic stenosis    NSTEMI (non-ST elevated myocardial infarction) (HCC) 04/2016   secondary to stress CM - Takotsubo CM   PAC (premature atrial contraction)    Event monitors 10/2013 and 08/2014 showed NSR, ST, PACs, PVCs.    PAD (peripheral artery disease) (HCC) 11/2005   popliteal bypass failed S/P R BKA, converted to AKA 11/2006   Posterior communicating artery aneurysm    followed by Dr Nigel Bart   PVC's (premature ventricular contractions)    Event monitors 10/2013 and 08/2014  showed NSR, ST, PACs, PVCs.    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   CARDIAC CATHETERIZATION N/A 04/21/2016   Procedure: Left Heart Cath and Coronary Angiography;  Surgeon: Lucendia Rusk, MD;  Location: [facility] INVASIVE CV LAB;  Service: Cardiovascular;  Laterality:  N/A;   IR GENERIC HISTORICAL  02/13/2016   IR RADIOLOGY PERIPHERAL GUIDED IV START 02/13/2016 Lucinda Saber, MD [facility]-INTERV RAD   IR GENERIC HISTORICAL  02/13/2016   IR US  GUIDE VASC ACCESS LEFT 02/13/2016 Lucinda Saber, MD [facility]   IR RADIOLOGIST EVAL & MGMT  10/18/2023   IR RADIOLOGIST EVAL & MGMT  11/08/2023   LEFT HEART CATH AND CORONARY ANGIOGRAPHY N/A 01/06/2023   Procedure: LEFT HEART CATH AND CORONARY ANGIOGRAPHY;  Surgeon: Odie Benne, MD;  Location: [facility] INVASIVE CV LAB;  Service: Cardiovascular;  Laterality: N/A;    Allergies: Ace inhibitors, Codeine, and Penicillins  Medications: Prior to Admission medications   Medication Sig Start Date End Date Taking? Authorizing Provider  acetaminophen  (TYLENOL ) 325 MG tablet Take 2 tablets (650 mg total) by mouth every 4 (four) hours as needed for headache or mild pain. 04/23/16  Yes Kilroy, Luke K, PA-C  alendronate (FOSAMAX) 70 MG tablet Take 70 mg by mouth every Monday.    [provider]  aspirin  EC 81 MG EC tablet Take 1 tablet (81 mg total) by mouth daily. 04/23/16   Abel Hoe, PA-C  atorvastatin  (LIPITOR ) 80 MG tablet TAKE 1 TABLET BY MOUTH EVERY DAY 10/26/23   Jacqueline Matsu, MD  Calcium  Carbonate-Vitamin D (CALCIUM -D PO) Take 1 tablet by mouth daily.    [provider]  carvedilol  (COREG ) 12.5 MG tablet TAKE 1 TABLET BY MOUTH TWICE A DAY 04/22/23   Chandrasekhar, Mahesh A, MD  Cholecalciferol (VITAMIN D) 125 MCG (5000 UT) CAPS Take 5,000 Units by mouth daily.    [provider]  ezetimibe  (ZETIA ) 10 MG tablet TAKE 1 TABLET BY MOUTH EVERY DAY 05/14/23   Jacqueline Matsu, MD  ferrous sulfate 325 (65 FE) MG EC tablet Take 325 mg by mouth 3 (three) times a week.    [provider]  fluticasone (FLONASE) 50 MCG/ACT nasal spray Place 1 spray into both nostrils daily as needed (congestion).  05/10/17   [provider]  fluticasone (FLOVENT HFA) 110 MCG/ACT inhaler Inhale 1 puff into the  lungs daily as needed (shortness of breath). 05/10/17   [provider]  gabapentin  (NEURONTIN ) 300 MG capsule Take 600 mg by mouth 3 (three) times daily.    [provider]  losartan  (COZAAR ) 25 MG tablet TAKE 1 TABLET (25 MG TOTAL) BY MOUTH DAILY. 08/12/23   Jacqueline Matsu, MD  Multiple Vitamin (MULTIVITAMIN WITH MINERALS) TABS tablet Take 1 tablet by mouth daily.    [provider]  nitroGLYCERIN  (NITROSTAT ) 0.4 MG SL tablet May take 1 tablet every 5 min for 15 min 02/09/23   Chandrasekhar, Mahesh A, MD  Omega-3 Fatty Acids (OMEGA-3 FISH OIL) 300 MG CAPS Take 300 mg by mouth daily.    [provider]  omeprazole (PRILOSEC OTC) 20 MG tablet Take 20 mg by mouth daily as needed (for heartburn).    [provider]  Polyvinyl Alcohol-Povidone (REFRESH OP) Place 1 drop into both eyes daily as needed (  dry eyes).    [provider]  ranolazine  (RANEXA ) 500 MG 12 hr tablet Take 1 tablet (500 mg total) by mouth 2 (two) times daily. 02/09/23   Chandrasekhar, Caretha Chapel, MD  spironolactone  (ALDACTONE ) 50 MG tablet Take 1 tablet (50 mg total) by mouth daily. 04/07/23   Jann Melody, MD     Family History  Problem Relation Age of Onset   Heart attack Mother    Heart disease Mother    Heart attack Brother    Heart disease Brother    Heart attack Sister    Heart disease Sister     Social History   Socioeconomic History   Marital status: Single    Spouse name: Not on file   Number of children: Not on file   Years of education: Not on file   Highest education level: Not on file  Occupational History   Not on file  Tobacco Use   Smoking status: Former    Current packs/day: 0.00    Types: Cigarettes    Quit date: 07/13/2004    Years since quitting: 19.3   Smokeless tobacco: Never  Vaping Use   Vaping status: Never Used  Substance and Sexual Activity   Alcohol use: Yes    Comment: ocassionally    Drug use: No   Sexual activity: Not  on file  Other Topics Concern   Not on file  Social History Narrative   Not on file   Social Drivers of Health   Financial Resource Strain: Not on file  Food Insecurity: Not on file  Transportation Needs: Not on file  Physical Activity: Not on file  Stress: Not on file  Social Connections: Not on file     Review of Systems: A 12 point ROS discussed and pertinent positives are indicated in the HPI above.  All other systems are negative.    Vital Signs: BP (!) 156/37   Pulse (!) 53   Temp 97.8 F (36.6 C) (Oral)   Resp 17   Ht 5\' 2"  (1.575 m)   Wt 135 lb (61.2 kg)   SpO2 98%   BMI 24.69 kg/m   Advance Care Plan: The advanced care place/surrogate decision maker was discussed at the time of visit and the patient did not wish to discuss or was not able to name a surrogate decision maker or provide an advance care plan.  Physical Exam Vitals reviewed.  Constitutional:      Comments: Elderly  Cardiovascular:     Rate and Rhythm: Normal rate.  Pulmonary:     Effort: Pulmonary effort is normal. No respiratory distress.  Abdominal:     Palpations: Abdomen is soft.  Musculoskeletal:     Comments: Right leg amputation  Skin:    General: Skin is warm and dry.  Neurological:     General: No focal deficit present.     Mental Status: She is alert and oriented to person, place, and time.  Psychiatric:        Mood and Affect: Mood normal.        Behavior: Behavior normal.        Thought Content: Thought content normal.        Judgment: Judgment normal.     Imaging: IR Radiologist Eval & Mgmt Result Date: 11/08/2023 EXAM: ESTABLISHED PATIENT OFFICE VISIT CHIEF COMPLAINT: See Epic note. HISTORY OF PRESENT ILLNESS: See Epic note. REVIEW OF SYSTEMS: See Epic note. PHYSICAL EXAMINATION: See Epic note. ASSESSMENT AND PLAN: See  Epic note. Creasie Doctor, MD [facility] Radiology Electronically Signed   By: Creasie Doctor M.D.   On:  11/08/2023 22:17    Labs:  CBC: Recent Labs    12/31/22 1514 12/01/23 1230  WBC 7.0 PENDING  HGB 12.2 11.1*  HCT 37.8 35.5*  PLT 157 134*    COAGS: No results for input(s): "INR", "APTT" in the last 8760 hours.  BMP: Recent Labs    12/31/22 1514  NA 134  K 5.1  CL 98  CO2 19*  GLUCOSE 89  BUN 20  CALCIUM  9.8  CREATININE 1.06*    LIVER FUNCTION TESTS: No results for input(s): "BILITOT", "AST", "ALT", "ALKPHOS", "PROT", "ALBUMIN" in the last 8760 hours.  TUMOR MARKERS: No results for input(s): "AFPTM", "CEA", "CA199", "CHROMGRNA" in the last 8760 hours.  Assessment and Plan:  Symptomatic right lower extremity arterial disease - severe pain with ambulation.  Will proceed with lower extremity angiography and possible angioplasty/ stent via radial artery access today by Dr. Jinx Mourning.  Risks and benefits of lower extremity angiography were discussed with the patient including, but not limited to bleeding, infection, vascular injury or contrast induced renal failure.  This interventional procedure involves the use of X-rays and because of the nature of the planned procedure, it is possible that we will have prolonged use of X-ray fluoroscopy.  Potential radiation risks to you include (but are not limited to) the following: - A slightly elevated risk for cancer  several years later in life. This risk is typically less than 0.5% percent. This risk is low in comparison to the normal incidence of human cancer, which is 33% for women and 50% for men according to the [employer] Society. - Radiation induced injury can include skin redness, resembling a rash, tissue breakdown / ulcers and hair loss (which can be temporary or permanent).   The likelihood of either of these occurring depends on the difficulty of the procedure and whether you are sensitive to radiation due to previous procedures, disease, or genetic conditions.   IF your procedure requires a prolonged use  of radiation, you will be notified and given written instructions for further action.  It is your responsibility to monitor the irradiated area for the 2 weeks following the procedure and to notify your physician if you are concerned that you have suffered a radiation induced injury.    All of the patient's questions were answered, patient is agreeable to proceed.  Consent signed and in chart.   Electronically Signed: Connor Deiters, PA-C   12/01/2023, 1:13 PM      I spent a total of    15 Minutes in face to face in clinical consultation, greater than 50% of which was counseling/coordinating care for lower extremity angiography/angioplasty.

## 2023-12-01 NOTE — Sedation Documentation (Addendum)
 Procedure cancelled, see MD Suttle's notes.

## 2023-12-09 ENCOUNTER — Other Ambulatory Visit: Payer: Self-pay | Admitting: Internal Medicine

## 2023-12-09 DIAGNOSIS — Z1231 Encounter for screening mammogram for malignant neoplasm of breast: Secondary | ICD-10-CM

## 2023-12-15 ENCOUNTER — Ambulatory Visit
Admission: RE | Admit: 2023-12-15 | Discharge: 2023-12-15 | Disposition: A | Discharge status: Home / Self Care | Source: Ambulatory Visit | Attending: Internal Medicine

## 2023-12-15 DIAGNOSIS — Z1231 Encounter for screening mammogram for malignant neoplasm of breast: Secondary | ICD-10-CM

## 2023-12-22 ENCOUNTER — Other Ambulatory Visit: Payer: Self-pay | Admitting: Internal Medicine

## 2023-12-22 DIAGNOSIS — R928 Other abnormal and inconclusive findings on diagnostic imaging of breast: Secondary | ICD-10-CM

## 2023-12-23 DIAGNOSIS — H35363 Drusen (degenerative) of macula, bilateral: Secondary | ICD-10-CM | POA: Diagnosis not present

## 2023-12-23 DIAGNOSIS — H40013 Open angle with borderline findings, low risk, bilateral: Secondary | ICD-10-CM | POA: Diagnosis not present

## 2023-12-23 DIAGNOSIS — H179 Unspecified corneal scar and opacity: Secondary | ICD-10-CM | POA: Diagnosis not present

## 2023-12-23 DIAGNOSIS — H04123 Dry eye syndrome of bilateral lacrimal glands: Secondary | ICD-10-CM | POA: Diagnosis not present

## 2023-12-31 ENCOUNTER — Ambulatory Visit
Admission: RE | Admit: 2023-12-31 | Discharge: 2023-12-31 | Disposition: A | Discharge status: Home / Self Care | Source: Ambulatory Visit | Attending: Internal Medicine | Admitting: Internal Medicine

## 2023-12-31 DIAGNOSIS — R928 Other abnormal and inconclusive findings on diagnostic imaging of breast: Secondary | ICD-10-CM

## 2023-12-31 DIAGNOSIS — R922 Inconclusive mammogram: Secondary | ICD-10-CM | POA: Diagnosis not present

## 2023-12-31 DIAGNOSIS — N6323 Unspecified lump in the left breast, lower outer quadrant: Secondary | ICD-10-CM | POA: Diagnosis not present

## 2024-01-04 ENCOUNTER — Ambulatory Visit: Attending: Vascular Surgery | Admitting: Vascular Surgery

## 2024-01-04 ENCOUNTER — Encounter: Payer: Self-pay | Admitting: Vascular Surgery

## 2024-01-04 VITALS — BP 168/55 | HR 63 | Temp 97.9°F | Resp 18 | Ht 62.0 in | Wt 135.0 lb

## 2024-01-04 DIAGNOSIS — I739 Peripheral vascular disease, unspecified: Secondary | ICD-10-CM | POA: Diagnosis not present

## 2024-01-04 NOTE — Progress Notes (Signed)
 Patient name: [name] MRN: [id number] DOB: 1940-12-23 Sex: female  REASON FOR CONSULT: PAD with aborted angiogram by IR  HPI: [name] is a 83 y.o. female, with history of hypertension, hyperlipidemia, coronary artery disease, carotid stenosis, PAD with right AKA that presents for evaluation of PAD with aborted recent angiogram by IR.  Patient underwent attempted angiogram of the left lower extremity on 12/01/2023 with Dr. Jennefer that was aborted due to inadequate access.  Per the notes the left wrist was evaluated and the radial artery was small, the left groin was evaluated and the common femoral was imperceptible, the left pedal arteries were evaluated but these were small.  She did have a CTA aorta bifemoral on 10/29/2023 showing diffuse SFA disease on the left with three-vessel runoff.  Today she tells me that her left leg has a pins-and-needles sensation that is in the thigh and calf similar to her right leg prior to her right leg amputation.  She has no tissue loss in the left lower extremity.  She has no pain in the foot.  She does state the leg gets tired.  Her ABIs were 0.6 on the left.  Past Medical History:  Diagnosis Date   CAD in native artery    a. 04/2016 NSTEMI with cath showing 25% MCx, 75% DX1 lesion and moderate LVD 35-45% out of proportion to CAD and in a pattern of Takotsubo CM (no clear stressor)   Carotid stenosis    Carotid Doppler showed 40 to 59% stenosis of the right carotid artery and less than 50% stenosis of the right common carotid artery.  There is 1 to 39% stenosis of the left internal carotid artery and greater than 50% stenosis of the common carotid artery on the left.  There is also left subclavian artery stenosis. by dopplers 05/2021   Dyslipidemia    GERD (gastroesophageal reflux disease)    HOCM (hypertrophic obstructive cardiomyopathy) (HCC)    Hypercholesteremia    Hypertension    Hypertensive heart disease    initially she was  thought to have a HOCM variant by cath with increased LVOT gradient but echo showed only a gradient across the LVOT and SAM and cardiac MRI did not show any late gad enhancement and only a LVOT gradient with 15mm BSH.  There was some mild SAM and mild MR.  EF normal at 76%.     LVH (left ventricular hypertrophy)    a. cMRI 2018 - showing moderate Basal septal hypertrophy not classic for HOCM morphology with no delayed gadolium uptake to suggest myofibrillar disarray, + SAM with small LVOT gradient in regard to physiology and mild MR.   Mild aortic stenosis    NSTEMI (non-ST elevated myocardial infarction) (HCC) 04/2016   secondary to stress CM - Takotsubo CM   PAC (premature atrial contraction)    Event monitors 10/2013 and 08/2014 showed NSR, ST, PACs, PVCs.    PAD (peripheral artery disease) (HCC) 11/2005   popliteal bypass failed S/P R BKA, converted to AKA 11/2006   Posterior communicating artery aneurysm    followed by Dr Unice   PVC's (premature ventricular contractions)    Event monitors 10/2013 and 08/2014 showed NSR, ST, PACs, PVCs.    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  CARDIAC CATHETERIZATION N/A 04/21/2016   Procedure: Left Heart Cath and Coronary Angiography;  Surgeon: Candyce GORMAN Reek, MD;  Location: [facility] INVASIVE CV LAB;  Service: Cardiovascular;  Laterality: N/A;   IR ANGIOGRAM EXTREMITY LEFT  12/01/2023   IR GENERIC HISTORICAL  02/13/2016   IR RADIOLOGY PERIPHERAL GUIDED IV START 02/13/2016 Ozell Specking, MD [facility]INTER[facility]   IR GENERIC HISTORICAL  02/13/2016   IR US  GUIDE VASC ACCESS LEFT 02/13/2016 Ozell Specking, MD [facility]   IR RADIOLOGIST EVAL & MGMT  10/18/2023   IR RADIOLOGIST EVAL & MGMT  11/08/2023   LEFT HEART CATH AND CORONARY ANGIOGRAPHY N/A 01/06/2023   Procedure: LEFT HEART CATH AND CORONARY  ANGIOGRAPHY;  Surgeon: Verlin Lonni BIRCH, MD;  Location: [facility] INVASIVE CV LAB;  Service: Cardiovascular;  Laterality: N/A;    Family History  Problem Relation Age of Onset   Heart attack Mother    Heart disease Mother    Heart attack Brother    Heart disease Brother    Heart attack Sister    Heart disease Sister     SOCIAL HISTORY: Social History   Socioeconomic History   Marital status: Single    Spouse name: Not on file   Number of children: Not on file   Years of education: Not on file   Highest education level: Not on file  Occupational History   Not on file  Tobacco Use   Smoking status: Former    Current packs/day: 0.00    Types: Cigarettes    Quit date: 07/13/2004    Years since quitting: 19.4   Smokeless tobacco: Never  Vaping Use   Vaping status: Never Used  Substance and Sexual Activity   Alcohol use: Yes    Comment: ocassionally    Drug use: No   Sexual activity: Not on file  Other Topics Concern   Not on file  Social History Narrative   Not on file   Social Drivers of Health   Financial Resource Strain: Not on file  Food Insecurity: Not on file  Transportation Needs: Not on file  Physical Activity: Not on file  Stress: Not on file  Social Connections: Not on file  Intimate Partner Violence: Not on file    Allergies  Allergen Reactions   Ace Inhibitors Cough   Codeine Itching, Rash and Other (See Comments)   Penicillins Itching, Rash and Other (See Comments)    Current Outpatient Medications  Medication Sig Dispense Refill   acetaminophen  (TYLENOL ) 325 MG tablet Take 2 tablets (650 mg total) by mouth every 4 (four) hours as needed for headache or mild pain.     alendronate (FOSAMAX) 70 MG tablet Take 70 mg by mouth every Monday.     aspirin  EC 81 MG EC tablet Take 1 tablet (81 mg total) by mouth daily.     atorvastatin  (LIPITOR ) 80 MG tablet TAKE 1 TABLET BY MOUTH EVERY DAY 90 tablet 0   Calcium  Carbonate-Vitamin D (CALCIUM -D PO) Take 1  tablet by mouth daily.     carvedilol  (COREG ) 12.5 MG tablet TAKE 1 TABLET BY MOUTH TWICE A DAY 180 tablet 2   Cholecalciferol (VITAMIN D) 125 MCG (5000 UT) CAPS Take 5,000 Units by mouth daily.     ezetimibe  (ZETIA ) 10 MG tablet TAKE 1 TABLET BY MOUTH EVERY DAY 90 tablet 2   ferrous sulfate 325 (65 FE) MG EC tablet Take 325 mg by mouth 3 (three) times a week.     fluticasone (FLONASE) 50 MCG/ACT  nasal spray Place 1 spray into both nostrils daily as needed (congestion).      fluticasone (FLOVENT HFA) 110 MCG/ACT inhaler Inhale 1 puff into the lungs daily as needed (shortness of breath).     gabapentin  (NEURONTIN ) 300 MG capsule Take 600 mg by mouth 3 (three) times daily.     losartan  (COZAAR ) 25 MG tablet TAKE 1 TABLET (25 MG TOTAL) BY MOUTH DAILY. 90 tablet 1   Multiple Vitamin (MULTIVITAMIN WITH MINERALS) TABS tablet Take 1 tablet by mouth daily.     nitroGLYCERIN  (NITROSTAT ) 0.4 MG SL tablet May take 1 tablet every 5 min for 15 min 25 tablet 3   Omega-3 Fatty Acids (OMEGA-3 FISH OIL) 300 MG CAPS Take 300 mg by mouth daily.     omeprazole (PRILOSEC OTC) 20 MG tablet Take 20 mg by mouth daily as needed (for heartburn).     Polyvinyl Alcohol-Povidone (REFRESH OP) Place 1 drop into both eyes daily as needed (dry eyes).     ranolazine  (RANEXA ) 500 MG 12 hr tablet Take 1 tablet (500 mg total) by mouth 2 (two) times daily. 180 tablet 3   spironolactone  (ALDACTONE ) 50 MG tablet Take 1 tablet (50 mg total) by mouth daily. 90 tablet 3   Current Facility-Administered Medications  Medication Dose Route Frequency Provider Last Rate Last Admin   sodium chloride  flush (NS) 0.9 % injection 3 mL  3 mL Intravenous Q12H Chandrasekhar, Mahesh A, MD        REVIEW OF SYSTEMS:  [X]  denotes positive finding, [ ]  denotes negative finding Cardiac  Comments:  Chest pain or chest pressure:    Shortness of breath upon exertion:    Short of breath when lying flat:    Irregular heart rhythm:        Vascular     Pain in calf, thigh, or hip brought on by ambulation:    Pain in feet at night that wakes you up from your sleep:     Blood clot in your veins:    Leg swelling:         Pulmonary    Oxygen at home:    Productive cough:     Wheezing:         Neurologic    Sudden weakness in arms or legs:     Sudden numbness in arms or legs:     Sudden onset of difficulty speaking or slurred speech:    Temporary loss of vision in one eye:     Problems with dizziness:         Gastrointestinal    Blood in stool:     Vomited blood:         Genitourinary    Burning when urinating:     Blood in urine:        Psychiatric    Major depression:         Hematologic    Bleeding problems:    Problems with blood clotting too easily:        Skin    Rashes or ulcers:        Constitutional    Fever or chills:      PHYSICAL EXAM: Vitals:   01/04/24 1354  BP: (!) 168/55  Pulse: 63  Resp: 18  Temp: 97.9 F (36.6 C)  TempSrc: Temporal  SpO2: 99%  Weight: 135 lb (61.2 kg)  Height: 5' 2 (1.575 m)    GENERAL: The patient is a well-nourished female, in no acute distress.  The vital signs are documented above. CARDIAC: There is a regular rate and rhythm.  VASCULAR:  Left radial pulse palpable Left brachial pulse palpable Left femoral pulse weakly palpable No palpable left pedal pulses but she has brisk DP PT signals No left leg tissue loss PULMONARY: No respiratory distress. ABDOMEN: Soft and non-tender. MUSCULOSKELETAL: There are no major deformities or cyanosis. NEUROLOGIC: No focal weakness or paresthesias are detected. SKIN: There are no ulcers or rashes noted. PSYCHIATRIC: The patient has a normal affect.  DATA:   CTA reviewed from 10/29/23 with occluded right common femoral artery and left common femoral and SFA disease   Assessment/Plan:   83 y.o. female, with history of hypertension, hyperlipidemia, coronary artery disease, carotid stenosis, PAD with right AKA that presents for  evaluation of PAD with aborted recent angiogram by IR.  Patient underwent attempted angiogram of the left lower extremity on 12/01/2023 by Dr. Jennefer that was aborted due to inadequate access.  Per the notes the left wrist was evaluated and the radial artery was small, the left groin was evaluated in the common femoral was imperceptible, the left pedal arteries were evaluated but these were small.  I discussed with her and her sons a more conservative approach at this time.  She does not have any classic symptoms of critical limb ischemia including no tissue loss and no pain in the foot to suggest rest pain to warrant urgent intervention.  She has very vague complaint of pins-and-needles sensation in the left thigh and calf that comes and goes and she is concerned that this is similar to her prior event in the other leg.  Her ABI is 0.6 which I would expect to be in the claudication range although she does not really ambulate much with her prosthesis other than in the house.  I will see her in 3 months with repeat ABIs for close interval follow-up.  Will plan left brachial access for lower extremity angiogram if she has progression of symptoms to warrant intervention.   Lonni DOROTHA Gaskins, MD [facility] [place] Office: [phone]

## 2024-01-05 ENCOUNTER — Other Ambulatory Visit: Payer: Self-pay

## 2024-01-05 DIAGNOSIS — I739 Peripheral vascular disease, unspecified: Secondary | ICD-10-CM

## 2024-01-26 ENCOUNTER — Other Ambulatory Visit: Payer: Self-pay | Admitting: Cardiology

## 2024-02-02 ENCOUNTER — Other Ambulatory Visit: Payer: Self-pay

## 2024-02-02 ENCOUNTER — Other Ambulatory Visit: Payer: Self-pay | Admitting: Internal Medicine

## 2024-02-02 MED ORDER — EZETIMIBE 10 MG PO TABS: 10.0000 mg | ORAL_TABLET | Freq: Every day | ORAL | 0 refills | Status: DC

## 2024-02-02 MED ORDER — ATORVASTATIN CALCIUM 80 MG PO TABS: 80.0000 mg | ORAL_TABLET | Freq: Every day | ORAL | 0 refills | Status: DC

## 2024-02-25 ENCOUNTER — Other Ambulatory Visit: Payer: Self-pay | Admitting: Cardiology

## 2024-03-02 ENCOUNTER — Other Ambulatory Visit: Payer: Self-pay

## 2024-03-02 DIAGNOSIS — I739 Peripheral vascular disease, unspecified: Secondary | ICD-10-CM

## 2024-03-07 ENCOUNTER — Other Ambulatory Visit: Payer: Self-pay | Admitting: Internal Medicine

## 2024-03-08 ENCOUNTER — Other Ambulatory Visit: Payer: Self-pay | Admitting: Internal Medicine

## 2024-03-16 ENCOUNTER — Encounter

## 2024-03-16 MED ORDER — CHLORTHALIDONE 25 MG PO TABS
25 | ORAL_TABLET | Freq: Every day | ORAL | 3 refills | 90.00000 days | Status: DC
Start: 2024-03-16 — End: 2024-04-20

## 2024-03-28 DIAGNOSIS — G546 Phantom limb syndrome with pain: Secondary | ICD-10-CM | POA: Insufficient documentation

## 2024-03-28 DIAGNOSIS — E7800 Pure hypercholesterolemia, unspecified: Secondary | ICD-10-CM | POA: Insufficient documentation

## 2024-03-28 DIAGNOSIS — J4520 Mild intermittent asthma, uncomplicated: Secondary | ICD-10-CM | POA: Insufficient documentation

## 2024-03-28 DIAGNOSIS — N1831 Chronic kidney disease, stage 3a: Secondary | ICD-10-CM | POA: Insufficient documentation

## 2024-03-28 DIAGNOSIS — H9190 Unspecified hearing loss, unspecified ear: Secondary | ICD-10-CM | POA: Insufficient documentation

## 2024-03-28 DIAGNOSIS — J309 Allergic rhinitis, unspecified: Secondary | ICD-10-CM | POA: Insufficient documentation

## 2024-03-28 DIAGNOSIS — R609 Edema, unspecified: Secondary | ICD-10-CM | POA: Insufficient documentation

## 2024-03-28 DIAGNOSIS — R29818 Other symptoms and signs involving the nervous system: Secondary | ICD-10-CM | POA: Insufficient documentation

## 2024-03-28 DIAGNOSIS — I671 Cerebral aneurysm, nonruptured: Secondary | ICD-10-CM | POA: Insufficient documentation

## 2024-03-28 DIAGNOSIS — D638 Anemia in other chronic diseases classified elsewhere: Secondary | ICD-10-CM | POA: Insufficient documentation

## 2024-03-28 DIAGNOSIS — K219 Gastro-esophageal reflux disease without esophagitis: Secondary | ICD-10-CM | POA: Insufficient documentation

## 2024-03-28 DIAGNOSIS — I700 Atherosclerosis of aorta: Secondary | ICD-10-CM | POA: Insufficient documentation

## 2024-03-28 DIAGNOSIS — S78119A Complete traumatic amputation at level between unspecified hip and knee, initial encounter: Secondary | ICD-10-CM | POA: Insufficient documentation

## 2024-03-28 DIAGNOSIS — I110 Hypertensive heart disease with heart failure: Secondary | ICD-10-CM | POA: Insufficient documentation

## 2024-03-28 DIAGNOSIS — K9289 Other specified diseases of the digestive system: Secondary | ICD-10-CM | POA: Insufficient documentation

## 2024-03-28 DIAGNOSIS — I059 Rheumatic mitral valve disease, unspecified: Secondary | ICD-10-CM | POA: Insufficient documentation

## 2024-03-28 DIAGNOSIS — I2510 Atherosclerotic heart disease of native coronary artery without angina pectoris: Secondary | ICD-10-CM | POA: Insufficient documentation

## 2024-03-28 DIAGNOSIS — I70209 Unspecified atherosclerosis of native arteries of extremities, unspecified extremity: Secondary | ICD-10-CM | POA: Insufficient documentation

## 2024-03-28 DIAGNOSIS — Q079 Congenital malformation of nervous system, unspecified: Secondary | ICD-10-CM | POA: Insufficient documentation

## 2024-04-04 ENCOUNTER — Ambulatory Visit (INDEPENDENT_AMBULATORY_CARE_PROVIDER_SITE_OTHER): Admitting: Neurology

## 2024-04-04 ENCOUNTER — Encounter: Payer: Self-pay | Admitting: Neurology

## 2024-04-04 VITALS — BP 134/67 | HR 64 | Ht 62.0 in | Wt 133.0 lb

## 2024-04-04 DIAGNOSIS — I671 Cerebral aneurysm, nonruptured: Secondary | ICD-10-CM

## 2024-04-04 DIAGNOSIS — Z89611 Acquired absence of right leg above knee: Secondary | ICD-10-CM | POA: Diagnosis not present

## 2024-04-04 DIAGNOSIS — R413 Other amnesia: Secondary | ICD-10-CM

## 2024-04-04 NOTE — Progress Notes (Signed)
 Chief Complaint  Patient presents with   RM14/MEMORY    Pt is here with her Son. Pt states that she has been forgetting things lately that started at the begging of the year. Pt states she has a hx of Stroke and Heart Attack. Pt denies any hx of Alzheimer's or Dementia in her family.  Pt states that she has numbness in her hands.    ASSESSMENT AND PLAN  [name] is a 83 y.o. 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   MEDICAL HISTORY:  [name] a 83 year old female accompanied by her son, seen in request by primary care from [facility] Dr. Vernon, R[place]a for evaluation of worsening memory loss, gait abnormality   History is obtained from the patient and review of electronic medical records. I personally reviewed pertinent available imaging films in PACS.   PMHx of  HTN HLD Stroke CAD  PAD, s/p Right AKA  She lives alone, noticed gradual onset memory loss around 2019, I saw her then for memory issues, MRI of the brain showed small vessel disease including lacunar infarction involving left corona radiata, she does has multiple vascular risk factors including hypertension hyperlipidemia, previous smoker, severe peripheral vascular disease, status post right AKA, walking with prosthetic, but rely on her cane   MRA in February 2019 also demonstrated 6 mm left P-comm aneurysm  She has quit driving since 7778, do not feel  confident finding her ways, eat well, sleeps well, transportation take her to [facility] twice a week for chair yoga, denies new onset focal symptoms,  Mini-Mental status examination only showed mild worsening, 25/11 April 2024, was 27/30 in May 2019  Laboratory evaluation May 2025, CMP with GFR of 52 mildly decreased sodium 135, normal B12 1425, TSH 0.59,  PHYSICAL EXAM:   Vitals:   04/04/24 0900  BP: 134/67  Pulse: 64  SpO2: 99%  Weight: 133 lb (60.3 kg)  Height: 5' 2 (1.575 m)  Body mass index is 24.33 kg/m.  PHYSICAL EXAMNIATION:  Gen: NAD, conversant, well nourised, well groomed                     Cardiovascular: Regular rate rhythm, no peripheral edema, warm, nontender. Eyes: Conjunctivae clear without exudates or hemorrhage Neck: Supple, no carotid bruits. Pulmonary: Clear to auscultation bilaterally   NEUROLOGICAL EXAM:  MENTAL STATUS: Speech/cognition: Awake, alert, oriented to history taking and casual conversation    04/04/2024    9:06 AM 11/23/2017    2:36 PM  MMSE - Mini Mental State Exam  Orientation to time 5 5  Orientation to Place 5 5  Registration 3 3  Attention/ Calculation 1 3  Recall 3 3  Language- name 2 objects 2 2  Language- repeat 1 1  Language- follow 3 step command 3 3  Language- read & follow direction 1 1  Write a sentence 1 1  Copy design 0 0  Total score 25 27    -CRANIAL NERVES: CN II: Visual fields are  full to confrontation. Pupils are round equal and briskly reactive to light. CN III, IV, VI: extraocular movement are normal. No ptosis. CN V: Facial sensation is intact to light touch CN VII: Face is symmetric with normal eye closure  CN VIII: Hearing is normal to causal conversation. CN IX, X: Phonation is normal. CN XI: Head turning and shoulder shrug are intact  MOTOR: There is no pronator drift of out-stretched arms. Muscle bulk and tone are normal. Muscle strength is normal.  REFLEXES: Reflexes are hypoactive and  symmetric at the biceps, triceps, right AKA, absent at ankles. Plantar responses are flexor.  SENSORY: Intact to light touch pinprick vibratory sensation at the left ankle  COORDINATION: There is no trunk or limb dysmetria noted.  GAIT/STANCE: Rely on her walker, where her right AKA prosthetic, stiff right leg  REVIEW OF SYSTEMS:  Full 14 system review of systems performed and notable only for as above All other review of systems were negative.   ALLERGIES: Allergies  Allergen Reactions   Ace Inhibitors Cough   Codeine Itching, Rash and Other (See Comments)   Penicillins Itching, Rash and Other (See Comments)    HOME MEDICATIONS: Current Outpatient Medications  Medication Sig Dispense Refill   acetaminophen  (TYLENOL ) 325 MG tablet Take 2 tablets (650 mg total) by mouth every 4 (four) hours as needed for headache or mild pain.     alendronate (FOSAMAX) 70 MG tablet Take 70 mg by mouth every Monday.     aspirin  EC 81 MG EC tablet Take 1 tablet (81 mg total) by mouth daily.     atorvastatin  (LIPITOR ) 80 MG tablet TAKE 1 TABLET BY MOUTH EVERY DAY 90 tablet 0   Calcium  Carbonate-Vitamin D (CALCIUM -D PO) Take 1 tablet by mouth daily.     carvedilol  (COREG ) 12.5 MG tablet TAKE 1 TABLET BY MOUTH TWICE A DAY 180 tablet 0   Cholecalciferol (VITAMIN D) 125 MCG (5000 UT) CAPS Take 5,000 Units by mouth daily.     ezetimibe  (ZETIA ) 10 MG tablet TAKE 1 TABLET BY MOUTH EVERY DAY 15 tablet 0   ferrous sulfate 325 (65 FE) MG EC tablet Take 325 mg by mouth 3 (three) times a week.     fluticasone (FLONASE) 50 MCG/ACT nasal spray Place 1 spray into both nostrils daily as needed (congestion).      fluticasone (FLOVENT HFA) 110 MCG/ACT inhaler Inhale 1 puff into the lungs daily as needed (shortness of breath).     gabapentin  (NEURONTIN ) 300 MG capsule Take 600 mg by mouth 3 (three) times daily.     losartan  (COZAAR ) 25 MG tablet Take 1 tablet (25 mg total) by mouth daily. Please call [phone] to  schedule an overdue appointment with Dr. Wilbert Bihari for future refills. Thank you. 2nd attempt. 15 tablet 0   Multiple Vitamin (MULTIVITAMIN WITH MINERALS) TABS tablet Take 1 tablet by mouth daily.     Omega-3 Fatty Acids (OMEGA-3 FISH OIL) 300 MG CAPS Take 300 mg by mouth daily.     omeprazole (PRILOSEC OTC) 20 MG tablet Take 20 mg by mouth daily as needed (for heartburn).     spironolactone  (ALDACTONE ) 50 MG tablet Take 1 tablet (50 mg total) by mouth daily. 90 tablet 3   nitroGLYCERIN  (NITROSTAT ) 0.4 MG SL tablet May take 1 tablet every 5 min for 15 min (Patient not taking: Reported on 04/04/2024) 25 tablet 3   Polyvinyl Alcohol-Povidone (REFRESH OP) Place 1 drop into both eyes daily as needed (dry eyes). (Patient  not taking: Reported on 04/04/2024)     ranolazine  (RANEXA ) 500 MG 12 hr tablet Take 1 tablet (500 mg total) by mouth 2 (two) times daily. (Patient not taking: Reported on 04/04/2024) 180 tablet 3   Current Facility-Administered Medications  Medication Dose Route Frequency Provider Last Rate Last Admin   sodium chloride  flush (NS) 0.9 % injection 3 mL  3 mL Intravenous Q12H Chandrasekhar, Mahesh A, MD        PAST MEDICAL HISTORY: Past Medical History:  Diagnosis Date   CAD in native artery    a. 04/2016 NSTEMI with cath showing 25% MCx, 75% DX1 lesion and moderate LVD 35-45% out of proportion to CAD and in a pattern of Takotsubo CM (no clear stressor)   Carotid stenosis    Carotid Doppler showed 40 to 59% stenosis of the right carotid artery and less than 50% stenosis of the right common carotid artery.  There is 1 to 39% stenosis of the left internal carotid artery and greater than 50% stenosis of the common carotid artery on the left.  There is also left subclavian artery stenosis. by dopplers 05/2021   Dyslipidemia    GERD (gastroesophageal reflux disease)    HOCM (hypertrophic obstructive cardiomyopathy) (HCC)    Hypercholesteremia    Hypertension    Hypertensive heart  disease    initially she was thought to have a HOCM variant by cath with increased LVOT gradient but echo showed only a gradient across the LVOT and SAM and cardiac MRI did not show any late gad enhancement and only a LVOT gradient with 15mm BSH.  There was some mild SAM and mild MR.  EF normal at 76%.     LVH (left ventricular hypertrophy)    a. cMRI 2018 - showing moderate Basal septal hypertrophy not classic for HOCM morphology with no delayed gadolium uptake to suggest myofibrillar disarray, + SAM with small LVOT gradient in regard to physiology and mild MR.   Mild aortic stenosis    NSTEMI (non-ST elevated myocardial infarction) (HCC) 04/2016   secondary to stress CM - Takotsubo CM   PAC (premature atrial contraction)    Event monitors 10/2013 and 08/2014 showed NSR, ST, PACs, PVCs.    PAD (peripheral artery disease) 11/2005   popliteal bypass failed S/P R BKA, converted to AKA 11/2006   Posterior communicating artery aneurysm    followed by Dr Unice   PVC's (premature ventricular contractions)    Event monitors 10/2013 and 08/2014 showed NSR, ST, PACs, PVCs.    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   EF normalized on repeat echo    PAST SURGICAL HISTORY: Past Surgical History:  Procedure Laterality Date   ABDOMINAL HYSTERECTOMY     BELOW KNEE LEG AMPUTATION     converted to AKA 5/08   CARDIAC CATHETERIZATION N/A 04/21/2016   Procedure: Left Heart Cath and Coronary Angiography;  Surgeon: Candyce GORMAN Reek, MD;  Location: [facility] INVASIVE CV LAB;  Service: Cardiovascular;  Laterality: N/A;   IR ANGIOGRAM EXTREMITY LEFT  12/01/2023   IR GENERIC HISTORICAL  02/13/2016   IR RADIOLOGY PERIPHERAL GUIDED IV START 02/13/2016 Ozell Specking, MD [facility]INTERV RAD   IR GENERIC HISTORICAL  02/13/2016   IR US  GUIDE VASC ACCESS LEFT 02/13/2016 Ozell Specking, MD [facility]   IR RADIOLOGIST EVAL & MGMT  10/18/2023   IR RADIOLOGIST EVAL & MGMT  11/08/2023   LEFT HEART CATH AND CORONARY ANGIOGRAPHY N/A  01/06/2023   Procedure: LEFT HEART CATH AND CORONARY ANGIOGRAPHY;  Surgeon: Verlin Lonni BIRCH, MD;  Location: [facility] INVASIVE CV LAB;  Service: Cardiovascular;  Laterality: N/A;    FAMILY HISTORY: Family History  Problem Relation Age of Onset   Heart attack Mother    Heart disease Mother    Heart attack Brother    Heart disease Brother    Heart attack Sister    Heart disease Sister     SOCIAL HISTORY: Social History   Socioeconomic History   Marital status: Single    Spouse name: Not on file   Number of children: Not on file   Years of education: Not on file   Highest education level: Not on file  Occupational History   Not on file  Tobacco Use   Smoking status: Former    Current packs/day: 0.00    Types: Cigarettes    Quit date: 07/13/2004    Years since quitting: 19.7   Smokeless tobacco: Never  Vaping Use   Vaping status: Never Used  Substance and Sexual Activity   Alcohol use: Yes    Comment: ocassionally    Drug use: No   Sexual activity: Not on file  Other Topics Concern   Not on file  Social History Narrative   Not on file   Social Drivers of Health   Financial Resource Strain: Not on file  Food Insecurity: Not on file  Transportation Needs: Not on file  Physical Activity: Not on file  Stress: Not on file  Social Connections: Not on file  Intimate Partner Violence: Not on file      Modena Callander, M.D. Ph.D.  [facility] [address], [address] [place], [place] [address] Ph: [phone] Fax: [phone]  CC:  Vernon Velna SAUNDERS, MD [address] [place],  [place] [address]  Vernon Velna SAUNDERS, MD

## 2024-04-05 ENCOUNTER — Telehealth: Payer: Self-pay | Admitting: Neurology

## 2024-04-05 NOTE — Telephone Encounter (Signed)
 no auth required sent to [facility] [phone]

## 2024-04-07 LAB — ATN PROFILE
A -- Beta-amyloid 42/40 Ratio: 0.14 (ref >0.102)
Beta-amyloid 40: 201.98 pg/mL
Beta-amyloid 42: 28.22 pg/mL
N -- NfL, Plasma: 4.51 pg/mL (ref 0.00–9.13)
T -- p-tau181: 1.16 pg/mL — AB (ref 0.00–0.97)

## 2024-04-07 LAB — RPR: RPR Ser Ql: NONREACTIVE

## 2024-04-07 LAB — TSH: TSH: 1.76 u[IU]/mL (ref 0.450–4.500)

## 2024-04-07 LAB — VITAMIN B12: Vitamin B-12: 1261 pg/mL — ABNORMAL HIGH (ref 232–1245)

## 2024-04-08 ENCOUNTER — Ambulatory Visit: Payer: Self-pay | Admitting: Neurology

## 2024-04-11 ENCOUNTER — Ambulatory Visit (HOSPITAL_COMMUNITY)
Admission: RE | Admit: 2024-04-11 | Discharge: 2024-04-11 | Disposition: A | Discharge status: Home / Self Care | Source: Ambulatory Visit | Attending: Vascular Surgery | Admitting: Vascular Surgery

## 2024-04-11 ENCOUNTER — Encounter: Payer: Self-pay | Admitting: Vascular Surgery

## 2024-04-11 ENCOUNTER — Ambulatory Visit: Admitting: Vascular Surgery

## 2024-04-11 VITALS — BP 180/66 | HR 46 | Temp 97.6°F | Resp 20 | Ht 62.0 in | Wt 131.8 lb

## 2024-04-11 DIAGNOSIS — I739 Peripheral vascular disease, unspecified: Secondary | ICD-10-CM | POA: Diagnosis not present

## 2024-04-11 LAB — VAS US ABI WITH/WO TBI: Left ABI: 0.33

## 2024-04-11 NOTE — Progress Notes (Signed)
 Patient name: [name] MRN: [id number] DOB: 08/11/1940 Sex: female  REASON FOR CONSULT: 3 month follow-up PAD (previously aborted angiogram by IR)  HPI: [name] is a 83 y.o. female, with history of hypertension, hyperlipidemia, coronary artery disease, carotid stenosis, PAD with right AKA that presents for 3 month follow-up for her PAD.  Patient underwent attempted angiogram of the left lower extremity on 12/01/2023 with Dr. Jennefer that was aborted due to inadequate access.  Per the notes the left wrist was evaluated and the radial artery was small, the left groin was evaluated and the common femoral was imperceptible, the left pedal arteries were evaluated but these were small.  She did have a CTA aorta bifemoral on 10/29/2023 showing diffuse SFA disease on the left with three-vessel runoff.  Today she has a myriad of complaints.  She complains about her right hand being numb, her left shoulder hurting, her left leg hurting mostly on the side along the lateral thigh when laying in bed.  Denies any real pain in the foot.  She has no tissue loss  Past Medical History:  Diagnosis Date   CAD in native artery    a. 04/2016 NSTEMI with cath showing 25% MCx, 75% DX1 lesion and moderate LVD 35-45% out of proportion to CAD and in a pattern of Takotsubo CM (no clear stressor)   Carotid stenosis    Carotid Doppler showed 40 to 59% stenosis of the right carotid artery and less than 50% stenosis of the right common carotid artery.  There is 1 to 39% stenosis of the left internal carotid artery and greater than 50% stenosis of the common carotid artery on the left.  There is also left subclavian artery stenosis. by dopplers 05/2021   Dyslipidemia    GERD (gastroesophageal reflux disease)    HOCM (hypertrophic obstructive cardiomyopathy) (HCC)    Hypercholesteremia    Hypertension    Hypertensive heart disease    initially she was thought to have a HOCM variant by cath with increased LVOT  gradient but echo showed only a gradient across the LVOT and SAM and cardiac MRI did not show any late gad enhancement and only a LVOT gradient with 15mm BSH.  There was some mild SAM and mild MR.  EF normal at 76%.     LVH (left ventricular hypertrophy)    a. cMRI 2018 - showing moderate Basal septal hypertrophy not classic for HOCM morphology with no delayed gadolium uptake to suggest myofibrillar disarray, + SAM with small LVOT gradient in regard to physiology and mild MR.   Mild aortic stenosis    NSTEMI (non-ST elevated myocardial infarction) (HCC) 04/2016   secondary to stress CM - Takotsubo CM   PAC (premature atrial contraction)    Event monitors 10/2013 and 08/2014 showed NSR, ST, PACs, PVCs.    PAD (peripheral artery disease) 11/2005   popliteal bypass failed S/P R BKA, converted to AKA 11/2006   Posterior communicating artery aneurysm    followed by Dr Unice   PVC's (premature ventricular contractions)    Event monitors 10/2013 and 08/2014 showed NSR, ST, PACs, PVCs.    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   CARDIAC CATHETERIZATION N/A 04/21/2016   Procedure: Left Heart Cath and Coronary Angiography;  Surgeon: Candyce  GORMAN Reek, MD;  Location: [facility] INVASIVE CV LAB;  Service: Cardiovascular;  Laterality: N/A;   IR ANGIOGRAM EXTREMITY LEFT  12/01/2023   IR GENERIC HISTORICAL  02/13/2016   IR RADIOLOGY PERIPHERAL GUIDED IV START 02/13/2016 Ozell Specking, MD [facility]   IR GENERIC HISTORICAL  02/13/2016   IR US  GUIDE VASC ACCESS LEFT 02/13/2016 Ozell Specking, MD [facility]   IR RADIOLOGIST EVAL & MGMT  10/18/2023   IR RADIOLOGIST EVAL & MGMT  11/08/2023   LEFT HEART CATH AND CORONARY ANGIOGRAPHY N/A 01/06/2023   Procedure: LEFT HEART CATH AND CORONARY ANGIOGRAPHY;  Surgeon: Verlin Lonni BIRCH, MD;  Location: [facility]  INVASIVE CV LAB;  Service: Cardiovascular;  Laterality: N/A;    Family History  Problem Relation Age of Onset   Heart attack Mother    Heart disease Mother    Heart attack Brother    Heart disease Brother    Heart attack Sister    Heart disease Sister     SOCIAL HISTORY: Social History   Socioeconomic History   Marital status: Single    Spouse name: Not on file   Number of children: Not on file   Years of education: Not on file   Highest education level: Not on file  Occupational History   Not on file  Tobacco Use   Smoking status: Former    Current packs/day: 0.00    Types: Cigarettes    Quit date: 07/13/2004    Years since quitting: 19.7   Smokeless tobacco: Never  Vaping Use   Vaping status: Never Used  Substance and Sexual Activity   Alcohol use: Yes    Comment: ocassionally    Drug use: No   Sexual activity: Not on file  Other Topics Concern   Not on file  Social History Narrative   Not on file   Social Drivers of Health   Financial Resource Strain: Not on file  Food Insecurity: Not on file  Transportation Needs: Not on file  Physical Activity: Not on file  Stress: Not on file  Social Connections: Not on file  Intimate Partner Violence: Not on file    Allergies  Allergen Reactions   Ace Inhibitors Cough   Codeine Itching, Rash and Other (See Comments)   Penicillins Itching, Rash and Other (See Comments)    Current Outpatient Medications  Medication Sig Dispense Refill   acetaminophen  (TYLENOL ) 325 MG tablet Take 2 tablets (650 mg total) by mouth every 4 (four) hours as needed for headache or mild pain.     alendronate (FOSAMAX) 70 MG tablet Take 70 mg by mouth every Monday.     aspirin  EC 81 MG EC tablet Take 1 tablet (81 mg total) by mouth daily.     atorvastatin  (LIPITOR ) 80 MG tablet TAKE 1 TABLET BY MOUTH EVERY DAY 90 tablet 0   Calcium  Carbonate-Vitamin D (CALCIUM -D PO) Take 1 tablet by mouth daily.     carvedilol  (COREG ) 12.5 MG tablet TAKE  1 TABLET BY MOUTH TWICE A DAY 180 tablet 0   Cholecalciferol (VITAMIN D) 125 MCG (5000 UT) CAPS Take 5,000 Units by mouth daily.     ezetimibe  (ZETIA ) 10 MG tablet TAKE 1 TABLET BY MOUTH EVERY DAY 15 tablet 0   ferrous sulfate 325 (65 FE) MG EC tablet Take 325 mg by mouth 3 (three) times a week.     fluticasone (FLONASE) 50 MCG/ACT nasal spray Place 1 spray into both nostrils daily as needed (congestion).  fluticasone (FLOVENT HFA) 110 MCG/ACT inhaler Inhale 1 puff into the lungs daily as needed (shortness of breath).     gabapentin  (NEURONTIN ) 300 MG capsule Take 600 mg by mouth 3 (three) times daily.     losartan  (COZAAR ) 25 MG tablet Take 1 tablet (25 mg total) by mouth daily. Please call [phone] to schedule an overdue appointment with Dr. Wilbert Bihari for future refills. Thank you. 2nd attempt. 15 tablet 0   Multiple Vitamin (MULTIVITAMIN WITH MINERALS) TABS tablet Take 1 tablet by mouth daily.     Omega-3 Fatty Acids (OMEGA-3 FISH OIL) 300 MG CAPS Take 300 mg by mouth daily.     omeprazole (PRILOSEC OTC) 20 MG tablet Take 20 mg by mouth daily as needed (for heartburn).     spironolactone  (ALDACTONE ) 50 MG tablet Take 1 tablet (50 mg total) by mouth daily. 90 tablet 3   Current Facility-Administered Medications  Medication Dose Route Frequency Provider Last Rate Last Admin   sodium chloride  flush (NS) 0.9 % injection 3 mL  3 mL Intravenous Q12H Chandrasekhar, Mahesh A, MD        REVIEW OF SYSTEMS:  [X]  denotes positive finding, [ ]  denotes negative finding Cardiac  Comments:  Chest pain or chest pressure:    Shortness of breath upon exertion:    Short of breath when lying flat:    Irregular heart rhythm:        Vascular    Pain in calf, thigh, or hip brought on by ambulation:    Pain in feet at night that wakes you up from your sleep:     Blood clot in your veins:    Leg swelling:         Pulmonary    Oxygen at home:    Productive cough:     Wheezing:          Neurologic    Sudden weakness in arms or legs:     Sudden numbness in arms or legs:     Sudden onset of difficulty speaking or slurred speech:    Temporary loss of vision in one eye:     Problems with dizziness:         Gastrointestinal    Blood in stool:     Vomited blood:         Genitourinary    Burning when urinating:     Blood in urine:        Psychiatric    Major depression:         Hematologic    Bleeding problems:    Problems with blood clotting too easily:        Skin    Rashes or ulcers:        Constitutional    Fever or chills:      PHYSICAL EXAM: Vitals:   04/11/24 1553  BP: (!) 180/66  Pulse: (!) 46  Resp: 20  Temp: 97.6 F (36.4 C)  TempSrc: Temporal  SpO2: 97%  Weight: 131 lb 12.8 oz (59.8 kg)  Height: 5' 2 (1.575 m)    GENERAL: The patient is a well-nourished female, in no acute distress. The vital signs are documented above. CARDIAC: There is a regular rate and rhythm.  VASCULAR:  Left radial pulse palpable Left brachial pulse palpable Left femoral pulse weakly palpable No left leg tissue loss PULMONARY: No respiratory distress. ABDOMEN: Soft and non-tender. MUSCULOSKELETAL: There are no major deformities or cyanosis. NEUROLOGIC: No focal weakness or paresthesias are detected.  SKIN: There are no ulcers or rashes noted. PSYCHIATRIC: The patient has a normal affect.  DATA:   ABI today 0.33 left   CTA reviewed from 10/29/23 with occluded right common femoral artery and left common femoral and SFA disease   Assessment/Plan:   83 y.o. female, with history of hypertension, hyperlipidemia, coronary artery disease, carotid stenosis, PAD with right AKA that presents for 3 month follow-up of her PAD with prior aborted angiogram by IR.  Patient underwent attempted angiogram of the left lower extremity on 12/01/2023 by Dr. Jennefer that was aborted due to inadequate access.  Per the notes the left wrist was evaluated and the radial artery was  small, the left groin was evaluated in the common femoral was imperceptible, the left pedal arteries were evaluated but these were small.  I previously recommended a conservative approach based on her symptoms.  Again today she has a whole myriad of complaints including right hand numbness, left shoulder pain, left leg pain laterally in the thigh when laying on her side.  She denies any pain in the foot as would be expected with rest pain.  She has no tissue loss.  I discussed we could come from a brachial approach to evaluate her left lower extremity runoff given her right common femoral is occluded.  Ultimately we agreed on continuing a conservative approach with follow-up in 6 months and ABI.  Her sons are very involved in her care and I discussed if she develops a wound on her foot or starts having pain in the foot or toes it would certainly be more convincing to pursue angiography.  Without question has evidence of severe PAD with an ABI of 0.33 but I cannot really correlate any of her main complaints today to her PAD and she has a lot of arthritic complaints.  Lonni DOROTHA Gaskins, MD [facility] Office: [phone]

## 2024-04-13 ENCOUNTER — Other Ambulatory Visit: Payer: Self-pay | Admitting: *Deleted

## 2024-04-13 DIAGNOSIS — I739 Peripheral vascular disease, unspecified: Secondary | ICD-10-CM

## 2024-04-15 ENCOUNTER — Other Ambulatory Visit: Payer: Self-pay | Admitting: Cardiology

## 2024-04-20 ENCOUNTER — Ambulatory Visit: Admit: 2024-04-20 | Discharge: 2024-04-24 | Payer: MEDICARE | Attending: Internal Medicine | Primary: Internal Medicine

## 2024-04-20 ENCOUNTER — Encounter

## 2024-04-20 ENCOUNTER — Ambulatory Visit
Admission: RE | Admit: 2024-04-20 | Discharge: 2024-04-20 | Disposition: A | Discharge status: Home / Self Care | Source: Ambulatory Visit | Attending: Neurology | Admitting: Neurology

## 2024-04-20 VITALS — BP 132/76 | HR 76 | Temp 98.00000°F | Resp 16 | Ht 64.0 in | Wt 151.0 lb

## 2024-04-20 DIAGNOSIS — R413 Other amnesia: Secondary | ICD-10-CM

## 2024-04-20 DIAGNOSIS — I671 Cerebral aneurysm, nonruptured: Secondary | ICD-10-CM | POA: Diagnosis not present

## 2024-04-20 DIAGNOSIS — Z0000 Encounter for general adult medical examination without abnormal findings: Principal | ICD-10-CM

## 2024-04-20 LAB — COMPREHENSIVE METABOLIC PANEL
ALT: 10 U/L (ref 10–35)
AST: 18 U/L (ref 10–35)
Albumin/Globulin Ratio: 1.3 (ref 1.1–2.2)
Albumin: 3.6 g/dL (ref 3.5–5.2)
Alk Phosphatase: 78 U/L (ref 35–104)
Anion Gap: 9 mmol/L (ref 2–14)
BUN/Creatinine Ratio: 19 (ref 12–20)
BUN: 19 mg/dL (ref 8–23)
CO2: 29 mmol/L (ref 20–29)
Calcium: 9.7 mg/dL (ref 8.8–10.2)
Chloride: 106 mmol/L (ref 98–107)
Creatinine: 1 mg/dL (ref 0.60–1.00)
Est, Glom Filt Rate: 56 ml/min/1.73m2 — ABNORMAL LOW (ref >59)
Globulin: 2.7 g/dL (ref 2.0–4.0)
Glucose: 101 mg/dL — ABNORMAL HIGH (ref 65–100)
Potassium: 5 mmol/L (ref 3.5–5.1)
Sodium: 144 mmol/L (ref 136–145)
Total Bilirubin: 0.3 mg/dL (ref 0.0–1.2)
Total Protein: 6.3 g/dL — ABNORMAL LOW (ref 6.4–8.3)

## 2024-04-20 LAB — CBC
Hematocrit: 40 % (ref 35.0–47.0)
Hemoglobin: 13 g/dL (ref 11.5–16.0)
MCH: 28.4 pg (ref 26.0–34.0)
MCHC: 32.5 g/dL (ref 30.0–36.5)
MCV: 87.5 FL (ref 80.0–99.0)
MPV: 11.8 FL (ref 8.9–12.9)
Nucleated RBCs: 0 /100{WBCs}
Platelets: 210 K/uL (ref 150–400)
RBC: 4.57 M/uL (ref 3.80–5.20)
RDW: 13 % (ref 11.5–14.5)
WBC: 7.8 K/uL (ref 3.6–11.0)
nRBC: 0 K/uL (ref 0.00–0.01)

## 2024-04-20 LAB — MAGNESIUM: Magnesium: 1.9 mg/dL (ref 1.6–2.4)

## 2024-04-20 LAB — LIPID PANEL
Chol/HDL Ratio: 3.3 (ref 0.0–5.0)
Cholesterol, Total: 224 mg/dL — ABNORMAL HIGH (ref 0–200)
HDL: 67 mg/dL — ABNORMAL HIGH (ref 40–60)
LDL Cholesterol: 132 mg/dL — ABNORMAL HIGH (ref 0–100)
Triglycerides: 122 mg/dL (ref 0–150)
VLDL Cholesterol Calculated: 24 mg/dL

## 2024-04-20 LAB — FERRITIN: Ferritin: 90 ng/mL (ref 13–252)

## 2024-04-20 MED ORDER — TETANUS-DIPHTH-ACELL PERTUSSIS 5-2-15.5 LF-MCG/0.5 IM SUSP
5-2-15.5 | Freq: Once | INTRAMUSCULAR | 0 refills | Status: AC
Start: 2024-04-20 — End: 2024-04-20

## 2024-04-20 MED ORDER — TELMISARTAN 80 MG PO TABS
80 | ORAL_TABLET | Freq: Every day | ORAL | 3 refills | Status: AC
Start: 2024-04-20 — End: ?

## 2024-04-20 MED ORDER — CHLORTHALIDONE 25 MG PO TABS
25 | ORAL_TABLET | ORAL | 3 refills | Status: AC
Start: 2024-04-20 — End: ?

## 2024-04-20 NOTE — Progress Notes (Signed)
 "Medicare Annual Wellness Visit    Valerie Barry is here for Medicare AWV    Assessment & Plan   Medicare annual wellness visit, subsequent  - She was advised to receive her influenza vaccine and COVID-19 booster at [facility]. A tetanus booster was also recommended, and a prescription for it was provided. She was informed that Medicare does not cover the tetanus vaccine at the clinic, and she should get it at the pharmacy.  Primary hypertension   Her blood pressure readings at home are around 130-140 mmHg.  - A refill for telmisartan  was provided. The dosage of chlorthalidone  was adjusted to half a pill every other day to manage her blood pressure and potentially alleviate leg cramps.  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  - A change in her blood pressure medication, specifically reducing chlorthalidone  to half a pill every other day, was recommended to see if it helps with the leg cramps. Stretching exercises were also suggested. A ferritin level test will be conducted to rule out iron deficiency as a contributing factor.  -     Magnesium; Future  Endometrial carcinoma (HCC)  Mixed hyperlipidemia  This condition is controlled on current medication regimen as written in medication list.  Medications refilled.  -     Lipid Panel; Future  Iron deficiency  Based on my history and available data, the overall control of this problem is unclear.  Will adjust medications and plan of care based on further evaluation.   -     CBC; Future  -     Ferritin; Future       No follow-ups on file.      Subjective   The following acute and/or chronic problems were also addressed today:    History of Present Illness  The patient is an 83 year old woman with hypertension and other medical issues, presenting for her annual wellness visit and follow-up.    She reports experiencing ongoing leg cramps, predominantly in the left leg where she had a knee replacement. These cramps occur at night, disrupting her sleep, and intensify after yard work. The frequency of these cramps is approximately four times a week. Despite trying over-the-counter dissolvable tablets, she found no relief. She has attempted to alleviate the cramps through exercise, applying warm cloths before bedtime, and using a heating pad throughout the night, but these measures have not been effective. She has undergone six knee surgeries, including one total knee replacement, which have limited her mobility in the yard.    HTN   She monitors her blood pressure at home, which typically ranges between 130 and 140. Her current medication regimen includes telmisartan  and half a tablet of chlorthalidone . She is seeking a refill for her telmisartan  prescription.    She has an appointment at [facility] to get influenza vaccine and COVID-19 booster. She has had all of the COVID-19 vaccines this year. She has received the RSV vaccine twice and does not wish  to receive it again.     She is not experiencing any heartburn, difficulty swallowing, constipation, or diarrhea. However, she notes that her bowel movements can be loose depending on her diet, but are otherwise normal.    She recently consulted with her oncologist, Dr. Devora, marking five years since her endometrial cancer diagnosis. She underwent radiation and chemotherapy in 2020, with three surgeries performed within a span of 12 days in August 2020. Chemotherapy was initiated in 03/2019, followed by radiation at the end of 05/2019.    Hobbies: Yard work    PAST SURGICAL HISTORY:  - Knee replacement surgery  -  Three surgeries related to endometrial cancer in 02/2019  - Chemotherapy initiated in 03/2019  - Radiation therapy at the end of 05/2019     Patient's complete Health Risk Assessment and screening values have been reviewed and are found in Flowsheets. The following problems were reviewed today and where indicated follow up appointments were made and/or referrals ordered.    Positive Risk Factor Screenings with Interventions:                        Advanced Directives:  Do you have a Living Will?: (!) No  Intervention:  has an advanced directive - a copy HAS NOT been provided.                                     Objective   Vitals:    04/20/24 1009   BP: 132/76   Pulse: 76   Resp: 16   Temp: 98 F (36.7 C)   SpO2: 97%   Weight: 68.5 kg (151 lb)   Height: 1.626 m (5' 4)      Body mass index is 25.92 kg/m.      General Appearance: alert and oriented to person, place and time, well developed and well- nourished, in no acute distress  Skin: warm and dry, no rash or erythema  Head: normocephalic and atraumatic  Eyes: pupils equal, round, and reactive to light, extraocular eye movements intact, conjunctivae normal  ENT: tympanic membrane, external ear and ear canal normal bilaterally, nose without deformity, nasal mucosa and turbinates normal without polyps  Neck: supple and non-tender without mass, no thyromegaly or thyroid nodules, no cervical lymphadenopathy  Pulmonary/Chest: clear to auscultation bilaterally- no wheezes, rales or rhonchi, normal air movement, no respiratory distress  Cardiovascular: normal rate, regular rhythm, normal S1 and S2, no murmurs, rubs, clicks, or gallops, distal pulses intact, no carotid bruits  Abdomen: soft, non-tender, non-distended, normal bowel sounds, no masses or organomegaly  Extremities: no cyanosis, clubbing or edema  Musculoskeletal: normal range of motion, no joint swelling, deformity or tenderness  Neurologic: reflexes normal and symmetric, no cranial nerve deficit, gait,  coordination and speech normal            Allergies   Allergen Reactions    Amlodipine  Swelling and Headaches    Codeine Nausea Only    Lisinopril Other (See Comments)     Facial flushing     Prior to Visit Medications   Medication Sig Taking? Authorizing Provider   chlorthalidone  (HYGROTON ) 25 MG tablet Take 0.5 tablets by mouth daily Yes Praveen Coia E, MD   Multiple Vitamins-Minerals (MULTIVITAMIN WOMEN 50+) TABS Take by mouth daily Yes [provider]   latanoprost (XALATAN) 0.005 % ophthalmic solution Place 1 drop into both eyes nightly  Yes [provider]   triamcinolone  (KENALOG ) 0.1 % cream Apply topically 2 times daily. Yes Ameliarose Shark, Norleen BRAVO, MD   telmisartan  (MICARDIS ) 80 MG tablet Take 1 tablet by mouth daily Yes Tykeria Wawrzyniak E, MD   Naproxen Sodium 220 MG CAPS Take by mouth as needed Yes Automatic Reconciliation, Ar   Cholecalciferol 50 MCG (2000 UT) CAPS Take 1 capsule by mouth daily  Patient not taking: Reported on 04/20/2024  Automatic Reconciliation, Ar       CareTeam (Including outside providers/suppliers regularly involved in providing care):   Patient Care Team:  Felsenthal, Norleen BRAVO, MD as PCP - General  Corinthia, Norleen BRAVO, MD as PCP - Empaneled Provider     Recommendations for Preventive Services Due: see orders and patient instructions/AVS.  Recommended screening schedule for the next 5-10 years is provided to the patient in written form: see Patient Instructions/AVS.     Reviewed and updated this visit:  Tobacco  Allergies  Meds  Surg Hx               "

## 2024-04-20 NOTE — Progress Notes (Signed)
"  Identified pt with two pt identifiers(name and DOB). Reviewed record in preparation for visit and have obtained necessary documentation. All patient medications has been reviewed.  Chief Complaint   Patient presents with    Medicare AWV       Health Maintenance Due   Topic    DTaP/Tdap/Td vaccine (2 - Td or Tdap)    Flu vaccine (1)    COVID-19 Vaccine (8 - 2025-26 season)    Annual Wellness Visit [facility])      Health Maintenance Review: Patient reminded of due or due soon health maintenance. I have asked the patient to contact his/her primary care provider (PCP) for follow-up on his/her health maintenance.    Wt Readings from Last 3 Encounters:   04/20/24 68.5 kg (151 lb)   04/16/23 77.1 kg (169 lb 14.4 oz)   01/15/23 77 kg (169 lb 12.8 oz)     Temp Readings from Last 3 Encounters:   04/20/24 98 F (36.7 C)   04/16/23 97.9 F (36.6 C) (Oral)   01/15/23 97.7 F (36.5 C) (Oral)     BP Readings from Last 3 Encounters:   04/20/24 132/76   04/16/23 (!) 148/73   01/15/23 139/78     Pulse Readings from Last 3 Encounters:   04/20/24 76   04/16/23 69   01/15/23 78       1. Have you been to the ER, urgent care clinic since your last visit?  Hospitalized since your last visit? No    2. Have you seen or consulted any other health care providers outside of the [facility] since your last visit? No     3. For patients aged 29-75: Has the patient had a colonoscopy / FIT/ Cologuard? NA - based on age    If the patient is female:    4. For patients aged 71-74: Has the patient had a mammogram within the past 2 years? NA - based on age or sex    65. For patients aged 21-65: Has the patient had a pap smear? NA - based on age or sex    Patient is accompanied by daughter I have received verbal consent from DANITA PROUD to discuss any/all medical information while they are present in the room.   "

## 2024-04-20 NOTE — Patient Instructions (Signed)
 "     Eating Healthy Foods: Care Instructions  With every meal, you can make healthy food choices. Try to eat a variety of fruits, vegetables, whole grains, lean proteins, and low-fat dairy products. This can help you get the right balance of nutrients, including vitamins and minerals. Small changes add up over time. You can start by adding one healthy food to your meals each day.    Try to make half your plate fruits and vegetables, one-fourth whole grains, and one-fourth lean proteins. Try including dairy with your meals.   Eat more fruits and vegetables. Try to have them with most meals and snacks.   Foods for healthy eating        Fruits   These can be fresh, frozen, canned, or dried.  Try to choose whole fruit rather than fruit juice.  Eat a variety of colors.        Vegetables   These can be fresh, frozen, canned, or dried.  Beans, peas, and lentils count too.        Whole grains   Choose whole-grain breads, cereals, and noodles.  Try brown rice.        Lean proteins   These can include lean meat, poultry, fish, and eggs.  You can also have tofu, beans, peas, lentils, nuts, and seeds.        Dairy   Try milk, yogurt, and cheese.  Choose low-fat or fat-free when you can.  If you need to, use lactose-free milk or fortified plant-based milk products, such as soy milk.        Water   Drink water when you're thirsty.  Limit sugar-sweetened drinks, including soda, fruit drinks, and sports drinks.  Where can you learn more?  Go to [employer] and enter T756 to learn more about Eating Healthy Foods: Care Instructions.  Current as of: April 19, 2023  Content Version: 14.6   2024-2025 [place], Isabel.   Care instructions adapted under license by [facility]. If you have questions about a medical condition or this instruction, always ask your healthcare professional. Romayne Alderman, [facility], disclaims any warranty or liability for your use of this information.         Advance  Directives: Care Instructions  Overview  An advance directive is a legal way to state your wishes at the end of your life. It tells your loved ones and doctor what to do if you can't say what you want.  There are two main types of advance directives. You can change them any time your wishes change.  Living will. This form tells your loved ones and doctor your wishes about life support and other treatment. The form is also called a declaration.  Medical power of attorney. This form lets you name a person to make treatment decisions for you when you can't speak for yourself. This person is called a health care agent (health care proxy, health care surrogate). The form is also called a durable power of attorney for health care.  If you do not have an advance directive, decisions about your medical care may be made by a family member or doctor who doesn't know you or by a judge.  It may help to think of an advance directive as a gift to the people who care for you. If you have one, they won't have to make tough decisions by themselves.  For more information, including forms for your state, see the [employer] website (plumberbiz.com.cy).  Follow-up care is  a key part of your treatment and safety. Be sure to make and go to all appointments, and call your doctor if you are having problems. It's also a good idea to know your test results and keep a list of the medicines you take.  What should you include in an advance directive?  Many states have a unique advance directive form. (It may ask you to address specific issues.) Or you might use a universal form that's approved by many states.  If your form doesn't tell you what to address, it may be hard to know what to include in your advance directive. Use the questions below to help you get started.  Who do you want to make decisions about your medical care if you are not able to?  What life-support measures do you want if you have a serious  illness that gets worse over time or can't be cured?  What are you most afraid of that might happen? (Maybe you're afraid of having pain, losing your independence, or being kept alive by machines.)  Where would you prefer to die? (Your home? A hospital? A nursing home?)  Do you want to donate your organs when you die?  Do you want certain religious practices performed before you die?  When should you call for help?  Be sure to contact your doctor if you have any questions.  Where can you learn more?  Go to [employer] and enter R264 to learn more about Advance Directives: Care Instructions.  Current as of: January 11, 2024  Content Version: 14.6   2024-2025 [place], .   Care instructions adapted under license by [facility]. If you have questions about a medical condition or this instruction, always ask your healthcare professional. Romayne Alderman, [facility], disclaims any warranty or liability for your use of this information.         A Healthy Heart: Care Instructions  Overview    Coronary artery disease, also called heart disease, occurs when a substance called plaque builds up in the vessels that supply oxygen-rich blood to your heart muscle. This can narrow the blood vessels and reduce blood flow. A heart attack happens when blood flow is completely blocked. A high-fat diet, smoking, and other factors increase the risk of heart disease.  Your doctor has found that you have a chance of having heart disease. A heart-healthy lifestyle can help keep your heart healthy and prevent heart disease. This lifestyle includes eating healthy, being active, staying at a weight that's healthy for you, and not smoking, vaping, or using other tobacco or nicotine products. It also includes taking medicines as directed, managing other health conditions, and trying to get a healthy amount of sleep.  Follow-up care is a key part of your treatment and safety. Be sure to make and go to all  appointments, and contact your doctor if you are having problems. It's also a good idea to know your test results and keep a list of the medicines you take.  How can you care for yourself at home?  Diet  Use less salt when you cook and eat. This helps lower your blood pressure. Taste food before salting. Add only a little salt when you think you need it. With time, your taste buds will adjust to less salt.  Eat fewer snack items, fast foods, canned soups, and other high-salt, high-fat, processed foods.  Read food labels and try to avoid saturated and trans fats. They increase your risk of heart disease by  raising cholesterol levels.  Limit the amount of solid fat--butter, margarine, and shortening--you eat. Use olive, peanut, or canola oil when you cook. Bake, broil, and steam foods instead of frying them.  Eat a variety of fruit and vegetables every day. Dark green, deep orange, red, or yellow fruits and vegetables are especially good for you. Examples include spinach, carrots, peaches, and berries.  Foods high in fiber can reduce your cholesterol and provide important vitamins and minerals. High-fiber foods include whole-grain cereals and breads, oatmeal, beans, brown rice, citrus fruits, and apples.  Eat lean proteins. Heart-healthy proteins include seafood, lean meats and poultry, eggs, beans, peas, nuts, seeds, and soy products.  Limit drinks and foods with added sugar. These include candy, desserts, and soda pop.  Heart-healthy lifestyle  If your doctor recommends it, get more exercise. For many people, walking is a good choice. Or you may want to swim, bike, or do other activities. Bit by bit, increase the time you're active every day. Try for at least 30 minutes on most days of the week.  If you smoke, vape, or use other tobacco or nicotine products, try to quit. If you cant quit, cut back as much as you can. If you need help quitting, talk to your doctor about quit programs and medicines. Quitting is one  of the most important things you can do to protect your heart. Also avoid secondhand smoke and the aerosol mist from vaping.  Stay at a weight that's healthy for you. Talk to your doctor if you need help losing weight.  Try to get 7 to 9 hours of sleep each night.  Limit alcohol to 2 drinks a day for men and 1 drink a day for women. Too much alcohol can cause health problems.  Manage other health problems such as diabetes, high blood pressure, and high cholesterol. If you think you may have a problem with alcohol or drug use, talk to your doctor.  Medicines  Take your medicines exactly as prescribed. Contact your doctor if you think you are having a problem with your medicine.  When should you call for help?  Call 911 if you have symptoms of a heart attack. These may include:  Chest pain or pressure, or a strange feeling in the chest.  Sweating.  Shortness of breath.  Pain, pressure, or a strange feeling in the back, neck, jaw, or upper belly or in one or both shoulders or arms.  Lightheadedness or sudden weakness.  A fast or irregular heartbeat.  After you call 911, the operator may tell you to chew 1 adult-strength or 2 to 4 low-dose aspirin. Wait for an ambulance. Do not try to drive yourself.  Watch closely for changes in your health, and be sure to contact your doctor if you have any problems.  Where can you learn more?  Go to [employer] and enter F075 to learn more about A Healthy Heart: Care Instructions.  Current as of: February 10, 2023  Content Version: 14.6   2024-2025 [place], .   Care instructions adapted under license by [facility]. If you have questions about a medical condition or this instruction, always ask your healthcare professional. Romayne Alderman, [facility], disclaims any warranty or liability for your use of this information.    Personalized Preventive Plan for Valerie Barry - 04/20/2024  Medicare offers a range of preventive health benefits. Some of the  tests and screenings are paid in full while other may be subject to a deductible, co-insurance,  and/or copay.  Some of these benefits include a comprehensive review of your medical history including lifestyle, illnesses that may run in your family, and various assessments and screenings as appropriate.  After reviewing your medical record and screening and assessments performed today your provider may have ordered immunizations, labs, imaging, and/or referrals for you.  A list of these orders (if applicable) as well as your Preventive Care list are included within your After Visit Summary for your review.      "

## 2024-04-23 NOTE — Result Encounter Note (Signed)
"  Results reviewed.  Please refer to MyChart comments.      "

## 2024-04-27 ENCOUNTER — Other Ambulatory Visit: Payer: Self-pay | Admitting: Cardiology

## 2024-05-01 NOTE — Telephone Encounter (Signed)
 Pt of Dr. Shlomo. Passed 3rd attempt. Does Dr. Shlomo want to refill? Please advise.

## 2024-05-04 ENCOUNTER — Other Ambulatory Visit: Payer: Self-pay | Admitting: Internal Medicine

## 2024-05-04 DIAGNOSIS — Z23 Encounter for immunization: Secondary | ICD-10-CM | POA: Diagnosis not present

## 2024-05-04 DIAGNOSIS — R197 Diarrhea, unspecified: Secondary | ICD-10-CM | POA: Diagnosis not present

## 2024-05-04 DIAGNOSIS — R1313 Dysphagia, pharyngeal phase: Secondary | ICD-10-CM | POA: Diagnosis not present

## 2024-05-04 DIAGNOSIS — N1831 Chronic kidney disease, stage 3a: Secondary | ICD-10-CM | POA: Diagnosis not present

## 2024-05-10 ENCOUNTER — Ambulatory Visit: Attending: Neurology | Admitting: Physical Therapy

## 2024-05-10 ENCOUNTER — Other Ambulatory Visit: Payer: Self-pay | Admitting: Cardiology

## 2024-05-10 DIAGNOSIS — G546 Phantom limb syndrome with pain: Secondary | ICD-10-CM | POA: Insufficient documentation

## 2024-05-10 DIAGNOSIS — R2689 Other abnormalities of gait and mobility: Secondary | ICD-10-CM | POA: Insufficient documentation

## 2024-05-10 DIAGNOSIS — R2681 Unsteadiness on feet: Secondary | ICD-10-CM | POA: Insufficient documentation

## 2024-05-10 DIAGNOSIS — M6281 Muscle weakness (generalized): Secondary | ICD-10-CM | POA: Insufficient documentation

## 2024-05-10 NOTE — Telephone Encounter (Signed)
 Pt has had numerous attempts to make overdue appt and pt still has not done so. Would Dr. Shlomo still like to refill medication without pt being seen?  Please address

## 2024-05-10 NOTE — Telephone Encounter (Signed)
 Call to patient to advise she needs an appointment before coreg  can be refilled. Patient states she has plenty for now but scheduled f/u for 07/19/24.

## 2024-05-11 ENCOUNTER — Ambulatory Visit: Admitting: Physical Therapy

## 2024-05-11 ENCOUNTER — Encounter: Payer: Self-pay | Admitting: Physical Therapy

## 2024-05-11 DIAGNOSIS — M6281 Muscle weakness (generalized): Secondary | ICD-10-CM | POA: Diagnosis present

## 2024-05-11 DIAGNOSIS — I671 Cerebral aneurysm, nonruptured: Secondary | ICD-10-CM | POA: Diagnosis not present

## 2024-05-11 DIAGNOSIS — R2689 Other abnormalities of gait and mobility: Secondary | ICD-10-CM | POA: Diagnosis present

## 2024-05-11 DIAGNOSIS — R2681 Unsteadiness on feet: Secondary | ICD-10-CM

## 2024-05-11 DIAGNOSIS — G546 Phantom limb syndrome with pain: Secondary | ICD-10-CM | POA: Diagnosis present

## 2024-05-11 NOTE — Therapy (Signed)
 OUTPATIENT PHYSICAL THERAPY NEURO EVALUATION   Patient Name: [name] MRN: [id number] DOB:Nov 26, 1940, 83 y.o., female Today's Date: 05/11/2024   PCP: Vernon Velna SAUNDERS, MD  REFERRING PROVIDER: Onita Duos, MD  END OF SESSION:  PT End of Session - 05/11/24 1356     Visit Number 1    Number of Visits 13    Date for Recertification  06/29/24    Authorization Type [employer] Dual Complete    PT Start Time 1354    PT Stop Time 1433    PT Time Calculation (min) 39 min    Activity Tolerance Patient tolerated treatment well    Behavior During Therapy WFL for tasks assessed/performed          Past Medical History:  Diagnosis Date   CAD in native artery    a. 04/2016 NSTEMI with cath showing 25% MCx, 75% DX1 lesion and moderate LVD 35-45% out of proportion to CAD and in a pattern of Takotsubo CM (no clear stressor)   Carotid stenosis    Carotid Doppler showed 40 to 59% stenosis of the right carotid artery and less than 50% stenosis of the right common carotid artery.  There is 1 to 39% stenosis of the left internal carotid artery and greater than 50% stenosis of the common carotid artery on the left.  There is also left subclavian artery stenosis. by dopplers 05/2021   Dyslipidemia    GERD (gastroesophageal reflux disease)    HOCM (hypertrophic obstructive cardiomyopathy) (HCC)    Hypercholesteremia    Hypertension    Hypertensive heart disease    initially she was thought to have a HOCM variant by cath with increased LVOT gradient but echo showed only a gradient across the LVOT and SAM and cardiac MRI did not show any late gad enhancement and only a LVOT gradient with 15mm BSH.  There was some mild SAM and mild MR.  EF normal at 76%.     LVH (left ventricular hypertrophy)    a. cMRI 2018 - showing moderate Basal septal hypertrophy not classic for HOCM morphology with no delayed gadolium uptake to suggest myofibrillar disarray, + SAM with small LVOT gradient in regard to  physiology and mild MR.   Mild aortic stenosis    NSTEMI (non-ST elevated myocardial infarction) (HCC) 04/2016   secondary to stress CM - Takotsubo CM   PAC (premature atrial contraction)    Event monitors 10/2013 and 08/2014 showed NSR, ST, PACs, PVCs.    PAD (peripheral artery disease) 11/2005   popliteal bypass failed S/P R BKA, converted to AKA 11/2006   Posterior communicating artery aneurysm    followed by Dr Unice   PVC's (premature ventricular contractions)    Event monitors 10/2013 and 08/2014 showed NSR, ST, PACs, PVCs.    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   EF normalized on repeat echo   Past Surgical History:  Procedure Laterality Date   ABDOMINAL HYSTERECTOMY     BELOW KNEE LEG AMPUTATION     converted to AKA 5/08   CARDIAC CATHETERIZATION N/A 04/21/2016   Procedure: Left Heart Cath and Coronary Angiography;  Surgeon: Candyce GORMAN Reek, MD;  Location: [facility] INVASIVE CV LAB;  Service: Cardiovascular;  Laterality: N/A;   IR ANGIOGRAM EXTREMITY LEFT  12/01/2023   IR GENERIC HISTORICAL  02/13/2016   IR RADIOLOGY PERIPHERAL GUIDED IV START 02/13/2016 Ozell Specking, MD [facility]-INTERV RAD   IR GENERIC HISTORICAL  02/13/2016   IR US  GUIDE VASC ACCESS LEFT 02/13/2016 Ozell Specking, MD  [facility]   IR RADIOLOGIST EVAL & MGMT  10/18/2023   IR RADIOLOGIST EVAL & MGMT  11/08/2023   LEFT HEART CATH AND CORONARY ANGIOGRAPHY N/A 01/06/2023   Procedure: LEFT HEART CATH AND CORONARY ANGIOGRAPHY;  Surgeon: Verlin Lonni BIRCH, MD;  Location: [facility] INVASIVE CV LAB;  Service: Cardiovascular;  Laterality: N/A;   Patient Active Problem List   Diagnosis Date Noted   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) 04/04/2024   Coronary arteriosclerosis in native artery 03/28/2024   Hypertensive heart failure (HCC) 03/28/2024   Above-knee amputation (HCC) 03/28/2024   Allergic rhinitis 03/28/2024   Anemia of chronic disease 03/28/2024   Atherosclerosis of aorta 03/28/2024   Edema, unspecified  03/28/2024   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/28/2024   Duodenogastric reflux 03/28/2024   Gastro-esophageal reflux disease without esophagitis 03/28/2024   Hearing loss 03/28/2024   Hereditary disorder of nervous system 03/28/2024   Pure hypercholesterolemia 03/28/2024   Mild intermittent asthma 03/28/2024   Mitral valve disorder 03/28/2024   Morbid obesity (HCC) 03/28/2024   Phantom limb syndrome with pain (HCC) 03/28/2024   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   Stage 3a chronic kidney disease (HCC) 03/28/2024   Stenosis of superficial femoral artery 03/28/2024   PAD (peripheral artery disease) 01/04/2024   Hypertrophic obstructive cardiomyopathy (HCC) 06/10/2022   Stenosis of left subclavian artery 05/28/2021   Aneurysm artery, neck 05/21/2021   Memory loss 11/23/2017   Dysphonia 05/17/2017   Laryngopharyngeal reflux (LPR) 05/17/2017   Throat clearing 05/17/2017   Takotsubo cardiomyopathy    Angina pectoris 04/21/2016   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   Dyslipidemia    PVC's (premature ventricular contractions) 08/14/2014   Essential hypertension, benign 11/01/2013   Peripheral vascular disease 11/01/2013   Symptomatic mammary hypertrophy 08/16/2012    ONSET DATE: 04/04/2024 (referral)   REFERRING DIAG: R41.3 (ICD-10-CM) - Memory loss I67.1 (ICD-10-CM) - Intracranial aneurysm Z89.611 (ICD-10-CM) - 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 Per son, pt is not walking on R AKA correctly and has not been for about 6 years. It has gotten to the point where  if she does walk correctly, she does not feel right. States she does not do much at home, mostly watches TV and will occasionally garden.   Is inconsistent wearing her R AKA prosthetic at home. Some days she does not wear it, other days she may wear it for 5 hours. Mainly uses her WC at home. States she feels safer performing house chores in her [facility] as she is very fearful of falling.   Goes to the [facility] about 2x/week and does chair yoga. Also likes the crunch machine. Does not drive    Pt accompanied by: Son, Ananias   PERTINENT HISTORY: hypertension, hyperlipidemia, coronary artery disease, carotid stenosis, PAD with right AKA  PAIN:  Are you having pain? Yes: NPRS scale: 4/10 Pain location: R residual limb, L thigh and L calf   Pain description: Phantom pain Aggravating factors: none  Relieving factors: Pain medicine   PRECAUTIONS: Fall  RED FLAGS: None   WEIGHT BEARING RESTRICTIONS: No  FALLS: Has patient fallen in last 6 months? No  LIVING ENVIRONMENT: Lives with: lives alone Lives in: House/apartment Stairs: Yes: External: ramped entry steps; on right going up, on left going up, and can reach both Has following equipment at home: Single point cane, Walker - 2 wheeled, Wheelchair (manual), shower chair, Grab bars, and Ramped entry  PLOF: Requires assistive device for independence  PATIENT GOALS: I guess walking better   OBJECTIVE:  Note: Objective measures were completed at Evaluation unless otherwise noted.  DIAGNOSTIC FINDINGS: MRA of head from 04/20/24 IMPRESSION: This MR angiogram of the intracranial arteries shows the following: 6 x 5 mm aneurysm arising from the distal clinoid segment of the left internal carotid artery appears unchanged compared to the 03/31/2023 MR angiogram. Intracranial stenosis as detailed above.  This most  significantly affects the proximal V4 segment of the left vertebral artery.  Milder stenosis in the left middle and right posterior cerebral arteries is not hemodynamically significant.  The intracranial stenosis appears unchanged compared to the 03/31/2023 MRI  COGNITION: Overall cognitive status: Impaired and History of cognitive impairments - at baseline MOCA: 25/30    SENSATION: Pt reports numbness/tingling in BLEs   EDEMA: Pt reports frequent distal swelling in LLE    POSTURE: rounded shoulders, forward head, and increased thoracic kyphosis  LOWER EXTREMITY ROM:     Active  Right Eval Left Eval  Hip flexion    Hip extension    Hip abduction    Hip adduction    Hip internal rotation    Hip external rotation    Knee flexion    Knee extension    Ankle dorsiflexion    Ankle plantarflexion    Ankle inversion    Ankle eversion     (Blank rows = not tested)  LOWER EXTREMITY MMT:    MMT Right Eval Left Eval  Hip flexion    Hip extension    Hip abduction    Hip adduction    Hip internal rotation    Hip external rotation    Knee flexion    Knee extension    Ankle dorsiflexion    Ankle plantarflexion    Ankle inversion    Ankle eversion    (Blank rows = not tested)  BED MOBILITY:  Not tested   TRANSFERS: Sit to stand: SBA  Assistive device utilized: Environmental Consultant - 2 wheeled     Stand to sit: SBA  Assistive device utilized: Environmental Consultant - 2 wheeled      RAMP:  Not  tested  CURB:  Not tested  STAIRS: Not tested GAIT: Gait pattern: step through pattern, decreased step length- Left, decreased stance time- Right, decreased stride length, decreased hip/knee flexion- Right, circumduction- Right, Right hip hike, lateral hip instability, lateral lean- Left, decreased trunk rotation, wide BOS, and poor foot clearance- Right Distance walked: Various clinic distances  Assistive device utilized: Walker - 2 wheeled Level of assistance: SBA Comments: Pt demonstrates absent knee  flexion on RLE and diminished knee flexion on LLE. Inconsistent foot placement noted w/RLE   FUNCTIONAL TESTS:   [facility] PT Assessment - 05/11/24 1406       Transfers   Five time sit to stand comments  61.47s   BUE support, significant shift to L side     Ambulation/Gait   Gait velocity 32.8' over 26.22s = 1.25 ft/s w/RW            CURRENT PROSTHETIC WEAR ASSESSMENT: Patient is independent with: skin check, residual limb care, care of non-amputated limb, prosthetic cleaning, ply sock cleaning, correct ply sock adjustment, and proper wear schedule/adjustment Patient is dependent with: proper weight-bearing schedule/adjustment Donning prosthesis: Complete Independence Doffing prosthesis: Complete Independence Prosthetic wear tolerance: 5 hours/day, 0-7 days/week Prosthetic weight bearing tolerance: 20-30 minutes Edema: None  Residual limb condition: Unable to assess on eval  Prosthetic description: Suction socket w/SACH foot, hydraulic knee, endoskeletal appearance  K code/activity level with prosthetic use: Level 2                                                                                                                               TREATMENT:    Self-care/home management  Discussed with pt and son that goal of PT is not to fix pt's gait but rather improve her functional strength and confidence in mobility in hopes that she can do more ADLS in stance rather than rely on WC. Pt reports her L knee frequently buckles and scares her, so she is fearful of standing and reaching for things (while cooking and cleaning). Recommended pt continue going to [facility] but want pt to incorporate more strength training into routine for maintained muscle mass and improved longevity. Pt verbalized understanding.     PATIENT EDUCATION: Education details: POC, eval findings, recommendations to incorporate strength training at [facility]  Person educated: Patient and Child(ren) Education method:  Explanation Education comprehension: verbalized understanding and needs further education  HOME EXERCISE PROGRAM: To be established   GOALS: Goals reviewed with patient? Yes  SHORT TERM GOALS: Target date: 06/08/2024   Pt will be independent with initial HEP for improved strength, balance, transfers and gait.  Baseline: Goal status: INITIAL  2.  Pt will improve 5 x STS to less than or equal to 50 seconds w/improved weightbearing on R side to demonstrate improved functional strength and transfer efficiency.   Baseline: 61.47s w/BUE support  Goal status: INITIAL  3.  Pt will improve gait velocity to at least 1.55 ft/s w/LRAD for  improved gait efficiency and independence  Baseline: 1.25 ft/s w/RW Goal status: INITIAL  4.  Berg to be assessed and LTG updated  Baseline:  Goal status: INITIAL   LONG TERM GOALS: Target date: 06/22/2024   Pt will be independent with final HEP and [employer] schedule for improved strength, balance, transfers and gait.  Baseline:  Goal status: INITIAL  2.  Pt will improve 5 x STS to less than or equal to 40 seconds w/improved weightbearing on RLE to demonstrate improved functional strength and transfer efficiency.   Baseline: 61.47s w/BUE support Goal status: INITIAL  3.  Pt will improve gait velocity to at least 1.8 ft/s w/LRAD for improved gait efficiency and reduced fall risk  Baseline: 1.25 ft/s w/RW Goal status: INITIAL  4.  Berg goal  Baseline:  Goal status: INITIAL    ASSESSMENT:  CLINICAL IMPRESSION: Patient is a 83 year old female referred to Neuro OPPT for memory loss and R AKA. Pt's PMH is significant for: hypertension, hyperlipidemia, coronary artery disease, carotid stenosis, PAD with right AKA. The following deficits were present during the exam: decreased functional strength, impaired gait kinematics, decreased endurance, mild cognitive impairment and impaired sensation. Based on R AKA, mild cognitive impairment and gait  speed, pt is an incr risk for falls. Pt would benefit from skilled PT to address these impairments and functional limitations to maximize functional mobility independence.   OBJECTIVE IMPAIRMENTS: Abnormal gait, decreased activity tolerance, decreased balance, decreased cognition, decreased coordination, decreased endurance, decreased mobility, difficulty walking, decreased strength, decreased safety awareness, impaired sensation, improper body mechanics, prosthetic dependency , and pain  ACTIVITY LIMITATIONS: carrying, lifting, bending, standing, squatting, stairs, transfers, reach over head, hygiene/grooming, locomotion level, and caring for others  PARTICIPATION LIMITATIONS: meal prep, cleaning, laundry, driving, shopping, community activity, and yard work  PERSONAL FACTORS: Age, Fitness, Past/current experiences, Transportation, and 1-2 comorbidities: R AKA and  are also affecting patient's functional outcome.   REHAB POTENTIAL: Fair Due to time since amputation and mild cognitive impairment   CLINICAL DECISION MAKING: Evolving/moderate complexity  EVALUATION COMPLEXITY: Moderate  PLAN:  PT FREQUENCY: 2x/week  PT DURATION: 6 weeks  PLANNED INTERVENTIONS: [id number]- PT Re-evaluation, 97750- Physical Performance Testing, 97110-Therapeutic exercises, 97530- Therapeutic activity, [id number]- Neuromuscular re-education, 97535- Self Care, [id number]- Manual therapy, [id number]- Gait training, [phone]- Prosthetic Initial , [phone]- Orthotic/Prosthetic subsequent, [phone]- Aquatic Therapy, [phone]- Electrical stimulation (manual), Patient/Family education, Balance training, Stair training, Vestibular training, and DME instructions  PLAN FOR NEXT SESSION: Berg and update goal. HEP for functional strength: Sit to stands, squats, monsters, reaching out of BOS    Saphronia Ozdemir E Karrington Mccravy, PT, DPT 05/11/2024, 2:34 PM

## 2024-05-15 ENCOUNTER — Ambulatory Visit: Attending: Neurology | Admitting: Physical Therapy

## 2024-05-15 VITALS — BP 148/60 | HR 68

## 2024-05-15 DIAGNOSIS — R2689 Other abnormalities of gait and mobility: Secondary | ICD-10-CM | POA: Insufficient documentation

## 2024-05-15 DIAGNOSIS — M6281 Muscle weakness (generalized): Secondary | ICD-10-CM | POA: Insufficient documentation

## 2024-05-15 DIAGNOSIS — G546 Phantom limb syndrome with pain: Secondary | ICD-10-CM | POA: Insufficient documentation

## 2024-05-15 DIAGNOSIS — R2681 Unsteadiness on feet: Secondary | ICD-10-CM | POA: Insufficient documentation

## 2024-05-15 NOTE — Therapy (Signed)
 OUTPATIENT PHYSICAL THERAPY NEURO TREATMENT   Patient Name: [name] MRN: [id number] DOB:25-Jul-1940, 83 y.o., female Today's Date: 05/15/2024   PCP: Vernon Velna SAUNDERS, MD  REFERRING PROVIDER: Onita Duos, MD  END OF SESSION:  PT End of Session - 05/15/24 1450     Visit Number 2    Number of Visits 13    Date for Recertification  06/29/24    Authorization Type [employer] Dual Complete    PT Start Time 1448    PT Stop Time 1528    PT Time Calculation (min) 40 min    Equipment Utilized During Treatment Gait belt;Other (comment)   R AKA prosthetic   Activity Tolerance Patient limited by pain    Behavior During Therapy [facility] for tasks assessed/performed          Past Medical History:  Diagnosis Date   CAD in native artery    a. 04/2016 NSTEMI with cath showing 25% MCx, 75% DX1 lesion and moderate LVD 35-45% out of proportion to CAD and in a pattern of Takotsubo CM (no clear stressor)   Carotid stenosis    Carotid Doppler showed 40 to 59% stenosis of the right carotid artery and less than 50% stenosis of the right common carotid artery.  There is 1 to 39% stenosis of the left internal carotid artery and greater than 50% stenosis of the common carotid artery on the left.  There is also left subclavian artery stenosis. by dopplers 05/2021   Dyslipidemia    GERD (gastroesophageal reflux disease)    HOCM (hypertrophic obstructive cardiomyopathy) (HCC)    Hypercholesteremia    Hypertension    Hypertensive heart disease    initially she was thought to have a HOCM variant by cath with increased LVOT gradient but echo showed only a gradient across the LVOT and SAM and cardiac MRI did not show any late gad enhancement and only a LVOT gradient with 15mm BSH.  There was some mild SAM and mild MR.  EF normal at 76%.     LVH (left ventricular hypertrophy)    a. cMRI 2018 - showing moderate Basal septal hypertrophy not classic for HOCM morphology with no delayed gadolium uptake to  suggest myofibrillar disarray, + SAM with small LVOT gradient in regard to physiology and mild MR.   Mild aortic stenosis    NSTEMI (non-ST elevated myocardial infarction) (HCC) 04/2016   secondary to stress CM - Takotsubo CM   PAC (premature atrial contraction)    Event monitors 10/2013 and 08/2014 showed NSR, ST, PACs, PVCs.    PAD (peripheral artery disease) 11/2005   popliteal bypass failed S/P R BKA, converted to AKA 11/2006   Posterior communicating artery aneurysm    followed by Dr Unice   PVC's (premature ventricular contractions)    Event monitors 10/2013 and 08/2014 showed NSR, ST, PACs, PVCs.    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   EF normalized on repeat echo   Past Surgical History:  Procedure Laterality Date   ABDOMINAL HYSTERECTOMY     BELOW KNEE LEG AMPUTATION     converted to AKA 5/08   CARDIAC CATHETERIZATION N/A 04/21/2016   Procedure: Left Heart Cath and Coronary Angiography;  Surgeon: Candyce GORMAN Reek, MD;  Location: [facility] INVASIVE CV LAB;  Service: Cardiovascular;  Laterality: N/A;   IR ANGIOGRAM EXTREMITY LEFT  12/01/2023   IR GENERIC HISTORICAL  02/13/2016   IR RADIOLOGY PERIPHERAL GUIDED IV START 02/13/2016 Ozell Specking, MD [facility]   IR GENERIC HISTORICAL  02/13/2016   IR US  GUIDE VASC ACCESS LEFT 02/13/2016 Ozell Specking, MD [facility]   IR RADIOLOGIST EVAL & MGMT  10/18/2023   IR RADIOLOGIST EVAL & MGMT  11/08/2023   LEFT HEART CATH AND CORONARY ANGIOGRAPHY N/A 01/06/2023   Procedure: LEFT HEART CATH AND CORONARY ANGIOGRAPHY;  Surgeon: Verlin Lonni BIRCH, MD;  Location: [facility] INVASIVE CV LAB;  Service: Cardiovascular;  Laterality: N/A;   Patient Active Problem List   Diagnosis Date Noted   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) 04/04/2024   Coronary arteriosclerosis in native artery 03/28/2024   Hypertensive heart failure (HCC) 03/28/2024   Above-knee amputation (HCC) 03/28/2024   Allergic rhinitis 03/28/2024   Anemia of chronic  disease 03/28/2024   Atherosclerosis of aorta 03/28/2024   Edema, unspecified 03/28/2024   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/28/2024   Duodenogastric reflux 03/28/2024   Gastro-esophageal reflux disease without esophagitis 03/28/2024   Hearing loss 03/28/2024   Hereditary disorder of nervous system 03/28/2024   Pure hypercholesterolemia 03/28/2024   Mild intermittent asthma 03/28/2024   Mitral valve disorder 03/28/2024   Morbid obesity (HCC) 03/28/2024   Phantom limb syndrome with pain (HCC) 03/28/2024   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   Stage 3a chronic kidney disease (HCC) 03/28/2024   Stenosis of superficial femoral artery 03/28/2024   PAD (peripheral artery disease) 01/04/2024   Hypertrophic obstructive cardiomyopathy (HCC) 06/10/2022   Stenosis of left subclavian artery 05/28/2021   Aneurysm artery, 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/2021   Memory loss 11/23/2017   Dysphonia 05/17/2017   Laryngopharyngeal reflux (LPR) 05/17/2017   Throat clearing 05/17/2017   Takotsubo cardiomyopathy    Angina pectoris 04/21/2016   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   Dyslipidemia    PVC's (premature ventricular contractions) 08/14/2014   Essential hypertension, benign 11/01/2013   Peripheral vascular disease 11/01/2013   Symptomatic mammary hypertrophy 08/16/2012    ONSET DATE: 04/04/2024 (referral)   REFERRING DIAG: R41.3 (ICD-10-CM) - Memory loss I67.1 (ICD-10-CM) - Intracranial aneurysm Z89.611 (ICD-10-CM) - 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 States she is doing well, denies falls. Having some pain in LLE  today. Did not go to the [facility] last week but plans on going tomorrow.    Pt accompanied by: Son, Ananias (in lobby)  PERTINENT HISTORY: hypertension, hyperlipidemia, coronary artery disease, carotid stenosis, PAD with right AKA  PAIN:  Are you having pain? Yes: NPRS scale: 4/10 Pain location: R residual limb, L thigh and L calf   Pain description: Phantom pain Aggravating factors: none  Relieving factors: Pain medicine   PRECAUTIONS: Fall  RED FLAGS: None   WEIGHT BEARING RESTRICTIONS: No  FALLS: Has patient fallen in last 6 months? No  LIVING ENVIRONMENT: Lives with: lives alone Lives in: House/apartment Stairs: Yes: External: ramped entry steps; on right going up, on left going up, and can reach both Has following equipment at home: Single point cane, Walker - 2 wheeled, Wheelchair (manual), shower chair, Grab bars, and Ramped entry  PLOF: Requires assistive device for independence  PATIENT GOALS: I guess walking better   OBJECTIVE:  Note: Objective measures were completed at Evaluation unless otherwise noted.  DIAGNOSTIC FINDINGS: MRA of head from 04/20/24 IMPRESSION: This MR angiogram of the intracranial arteries shows the following: 6 x 5 mm aneurysm arising from the distal clinoid segment of the left internal carotid artery appears unchanged compared to the 03/31/2023 MR angiogram. Intracranial stenosis as detailed above.  This most significantly affects the proximal V4 segment of the left vertebral artery.  Milder stenosis in the left middle and right posterior cerebral arteries is not hemodynamically significant.  The intracranial stenosis appears unchanged compared to the 03/31/2023 MRI  COGNITION: Overall cognitive status: Impaired and History of cognitive impairments - at baseline MOCA: 25/30    SENSATION: Pt reports  numbness/tingling in BLEs   EDEMA: Pt reports frequent distal swelling in LLE    POSTURE: rounded shoulders, forward head, and increased thoracic kyphosis  LOWER EXTREMITY ROM:     Active  Right Eval Left Eval  Hip flexion    Hip extension    Hip abduction    Hip adduction    Hip internal rotation    Hip external rotation    Knee flexion    Knee extension    Ankle dorsiflexion    Ankle plantarflexion    Ankle inversion    Ankle eversion     (Blank rows = not tested)  LOWER EXTREMITY MMT:    MMT Right Eval Left Eval  Hip flexion    Hip extension    Hip abduction    Hip adduction    Hip internal rotation    Hip external rotation    Knee flexion    Knee extension    Ankle dorsiflexion    Ankle plantarflexion    Ankle inversion    Ankle eversion    (Blank rows = not tested)  BED MOBILITY:  Not tested   TRANSFERS: Sit to stand: SBA  Assistive device utilized: Environmental Consultant - 2 wheeled     Stand to sit: SBA  Assistive device utilized: Environmental Consultant - 2 wheeled      RAMP:  Not tested  CURB:  Not tested  STAIRS: Not tested GAIT: Gait pattern: step through pattern, decreased step length- Left, decreased stance time- Right, decreased stride length, decreased hip/knee flexion- Right, circumduction- Right, Right hip hike, lateral hip instability, lateral lean- Left, decreased trunk rotation, wide BOS, and poor foot clearance- Right Distance walked: Various clinic distances  Assistive device utilized: Walker - 2 wheeled Level of assistance: SBA Comments: Pt demonstrates absent knee flexion on RLE and diminished knee  flexion on LLE. Inconsistent foot placement noted w/RLE   FUNCTIONAL TESTS:   [facility] PT Assessment - 05/15/24 1511       Balance   Balance Assessed Yes      Standardized Balance Assessment   Standardized Balance Assessment Berg Balance Test      Berg Balance Test   Sit to Stand Needs minimal aid to stand or to stabilize   BUE support, RW required to  stabilize in stance   Standing Unsupported Able to stand 30 seconds unsupported    Sitting with Back Unsupported but Feet Supported on Floor or Stool Able to sit safely and securely 2 minutes    Stand to Sit Controls descent by using hands    Transfers Needs one person to assist   Required UE support on RW when standing from mat   Standing Unsupported with Eyes Closed Able to stand 10 seconds with supervision    Standing Unsupported with Feet Together Needs help to attain position but able to stand for 30 seconds with feet together    From Standing, Reach Forward with Outstretched Arm Loses balance while trying/requires external support    From Standing Position, Pick up Object from Floor Unable to try/needs assist to keep balance    From Standing Position, Turn to Look Behind Over each Shoulder Turn sideways only but maintains balance             CURRENT PROSTHETIC WEAR ASSESSMENT: Patient is independent with: skin check, residual limb care, care of non-amputated limb, prosthetic cleaning, ply sock cleaning, correct ply sock adjustment, and proper wear schedule/adjustment Patient is dependent with: proper weight-bearing schedule/adjustment Donning prosthesis: Complete Independence Doffing prosthesis: Complete Independence Prosthetic wear tolerance: 5 hours/day, 0-7 days/week Prosthetic weight bearing tolerance: 20-30 minutes Edema: None  Residual limb condition: Unable to assess on eval  Prosthetic description: Suction socket w/SACH foot, hydraulic knee, endoskeletal appearance  K code/activity level with prosthetic use: Level 2   VITALS  Vitals:   05/15/24 1459 05/15/24 1507  BP: (!) 132/51 (!) 148/60  Pulse: (!) 59 68                                                                                                                               TREATMENT:    Self-care/home management  Educated pt on densensitization techniques to assist w/phantom limb pain and provided  handout (see HEP below)  Assessed vitals in RUE while seated (see above) and diastolic BP on lower end. Pt reports her diastolic BP has been as low as 41 at home, but typically is in the 60s. Encouraged pt to stay hydrated and continue to monitor at home. Assessed BP in RUE while standing and pt not orthostatic and denied lightheadedness/dizziness   Physical Performance   [facility] PT Assessment - 05/15/24 1511       Balance   Balance Assessed Yes      Standardized Balance Assessment   Standardized Balance  Assessment Berg Balance Test      Berg Balance Test   Sit to Stand Needs minimal aid to stand or to stabilize   BUE support, RW required to stabilize in stance   Standing Unsupported Able to stand 30 seconds unsupported    Sitting with Back Unsupported but Feet Supported on Floor or Stool Able to sit safely and securely 2 minutes    Stand to Sit Controls descent by using hands    Transfers Needs one person to assist   Required UE support on RW when standing from mat   Standing Unsupported with Eyes Closed Able to stand 10 seconds with supervision    Standing Unsupported with Feet Together Needs help to attain position but able to stand for 30 seconds with feet together    From Standing, Reach Forward with Outstretched Arm Loses balance while trying/requires external support    From Standing Position, Pick up Object from Floor Unable to try/needs assist to keep balance    From Standing Position, Turn to Look Behind Over each Shoulder Turn sideways only but maintains balance             PATIENT EDUCATION: Education details: See self-care above  Person educated: Patient Education method: Explanation, Demonstration, and Handouts Education comprehension: verbalized understanding, returned demonstration, and needs further education  HOME EXERCISE PROGRAM: Desensitization Techniques: -Light touch/pressure:  using fingertip pressure to slowly move up small segments of the affected body  part until outside the area of pain and then back to where you started -Deep pressure:  using fingertips to squeeze in small segments starting outside the affected area, crossing over the affected area, and then to the other side of the affected area -Tapping:  fingertips tap with firm pressure over the affected area, can move around the affected area as well -Brushing/scratching:  use fingertips to brush/scratch over and around the affected area -Textures:  use soft and rough textured items like cotton balls vs wash cloths to brush or rub with light into firm pressure over and around the affected area  Repeat these 3-4x per day.   GOALS: Goals reviewed with patient? Yes  SHORT TERM GOALS: Target date: 06/08/2024   Pt will be independent with initial HEP for improved strength, balance, transfers and gait.  Baseline: Goal status: INITIAL  2.  Pt will improve 5 x STS to less than or equal to 50 seconds w/improved weightbearing on R side to demonstrate improved functional strength and transfer efficiency.   Baseline: 61.47s w/BUE support  Goal status: INITIAL  3.  Pt will improve gait velocity to at least 1.55 ft/s w/LRAD for improved gait efficiency and independence  Baseline: 1.25 ft/s w/RW Goal status: INITIAL  4.  Berg to be assessed and LTG updated  Baseline:  Goal status: INITIAL   LONG TERM GOALS: Target date: 06/22/2024   Pt will be independent with final HEP and [employer] schedule for improved strength, balance, transfers and gait.  Baseline:  Goal status: INITIAL  2.  Pt will improve 5 x STS to less than or equal to 40 seconds w/improved weightbearing on RLE to demonstrate improved functional strength and transfer efficiency.   Baseline: 61.47s w/BUE support Goal status: INITIAL  3.  Pt will improve gait velocity to at least 1.8 ft/s w/LRAD for improved gait efficiency and reduced fall risk  Baseline: 1.25 ft/s w/RW Goal status: INITIAL  4.  Berg goal  Baseline:   Goal status: INITIAL    ASSESSMENT:  CLINICAL IMPRESSION:  Emphasis of skilled PT session on pt education regarding desensitization techniques, monitoring vitals and assessing standing balance via Berg. Pt reporting increased phantom pain today, so provided pt w/handout on desensitization techniques and pt reports she typically rubs her socket rather than her residual limb. Encouraged pt to try rubbing residual limb and assess if this is helpful. Pt's diastolic BP low today, but pt states this is not abnormal for her and denied lightheadedness/dizziness. Unable to complete Berg assessment today due to fatigue and pain in LLE w/stance, so will complete next session. Continue POC.    OBJECTIVE IMPAIRMENTS: Abnormal gait, decreased activity tolerance, decreased balance, decreased cognition, decreased coordination, decreased endurance, decreased mobility, difficulty walking, decreased strength, decreased safety awareness, impaired sensation, improper body mechanics, prosthetic dependency , and pain  ACTIVITY LIMITATIONS: carrying, lifting, bending, standing, squatting, stairs, transfers, reach over head, hygiene/grooming, locomotion level, and caring for others  PARTICIPATION LIMITATIONS: meal prep, cleaning, laundry, driving, shopping, community activity, and yard work  PERSONAL FACTORS: Age, Fitness, Past/current experiences, Transportation, and 1-2 comorbidities: R AKA and  are also affecting patient's functional outcome.   REHAB POTENTIAL: Fair Due to time since amputation and mild cognitive impairment   CLINICAL DECISION MAKING: Evolving/moderate complexity  EVALUATION COMPLEXITY: Moderate  PLAN:  PT FREQUENCY: 2x/week  PT DURATION: 6 weeks  PLANNED INTERVENTIONS: 97164- PT Re-evaluation, 97750- Physical Performance Testing, 97110-Therapeutic exercises, 97530- Therapeutic activity, [id number]- Neuromuscular re-education, 97535- Self Care, [id number]- Manual therapy, [id number]- Gait training, [phone]-  Prosthetic Initial , [phone]- Orthotic/Prosthetic subsequent, [phone]- Aquatic Therapy, [phone]- Electrical stimulation (manual), Patient/Family education, Balance training, Stair training, Vestibular training, and DME instructions  PLAN FOR NEXT SESSION: Fleurette Levins and update goal. HEP for functional strength: Sit to stands, squats, monsters, reaching out of BOS. Countertop reaching and standing tolerance    Yukari Flax E Alon Mazor, PT, DPT 05/15/2024, 3:37 PM

## 2024-05-17 ENCOUNTER — Ambulatory Visit: Admitting: Physical Therapy

## 2024-05-17 VITALS — BP 128/45 | HR 54

## 2024-05-17 DIAGNOSIS — M6281 Muscle weakness (generalized): Secondary | ICD-10-CM

## 2024-05-17 DIAGNOSIS — G546 Phantom limb syndrome with pain: Secondary | ICD-10-CM

## 2024-05-17 DIAGNOSIS — R2689 Other abnormalities of gait and mobility: Secondary | ICD-10-CM

## 2024-05-17 DIAGNOSIS — R2681 Unsteadiness on feet: Secondary | ICD-10-CM

## 2024-05-17 NOTE — Therapy (Signed)
 OUTPATIENT PHYSICAL THERAPY NEURO TREATMENT - ARRIVED NO CHARGE   Patient Name: [name] MRN: [id number] DOB:04-01-1941, 83 y.o., female Today's Date: 05/17/2024   PCP: Vernon Velna SAUNDERS, MD  REFERRING PROVIDER: Onita Duos, MD  END OF SESSION:  PT End of Session - 05/17/24 1106     Visit Number 2   Arrived no charge   Number of Visits 13    Date for Recertification  06/29/24    Authorization Type [employer] Dual Complete    PT Start Time 1103    PT Stop Time 1125   Arrived no charge   PT Time Calculation (min) 22 min    Equipment Utilized During Treatment Other (comment)   R AKA prosthetic   Activity Tolerance Other (comment)   Disatolic hypotension   Behavior During Therapy WFL for tasks assessed/performed          Past Medical History:  Diagnosis Date   CAD in native artery    a. 04/2016 NSTEMI with cath showing 25% MCx, 75% DX1 lesion and moderate LVD 35-45% out of proportion to CAD and in a pattern of Takotsubo CM (no clear stressor)   Carotid stenosis    Carotid Doppler showed 40 to 59% stenosis of the right carotid artery and less than 50% stenosis of the right common carotid artery.  There is 1 to 39% stenosis of the left internal carotid artery and greater than 50% stenosis of the common carotid artery on the left.  There is also left subclavian artery stenosis. by dopplers 05/2021   Dyslipidemia    GERD (gastroesophageal reflux disease)    HOCM (hypertrophic obstructive cardiomyopathy) (HCC)    Hypercholesteremia    Hypertension    Hypertensive heart disease    initially she was thought to have a HOCM variant by cath with increased LVOT gradient but echo showed only a gradient across the LVOT and SAM and cardiac MRI did not show any late gad enhancement and only a LVOT gradient with 15mm BSH.  There was some mild SAM and mild MR.  EF normal at 76%.     LVH (left ventricular hypertrophy)    a. cMRI 2018 - showing moderate Basal septal hypertrophy not  classic for HOCM morphology with no delayed gadolium uptake to suggest myofibrillar disarray, + SAM with small LVOT gradient in regard to physiology and mild MR.   Mild aortic stenosis    NSTEMI (non-ST elevated myocardial infarction) (HCC) 04/2016   secondary to stress CM - Takotsubo CM   PAC (premature atrial contraction)    Event monitors 10/2013 and 08/2014 showed NSR, ST, PACs, PVCs.    PAD (peripheral artery disease) 11/2005   popliteal bypass failed S/P R BKA, converted to AKA 11/2006   Posterior communicating artery aneurysm    followed by Dr Unice   PVC's (premature ventricular contractions)    Event monitors 10/2013 and 08/2014 showed NSR, ST, PACs, PVCs.    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   EF normalized on repeat echo   Past Surgical History:  Procedure Laterality Date   ABDOMINAL HYSTERECTOMY     BELOW KNEE LEG AMPUTATION     converted to AKA 5/08   CARDIAC CATHETERIZATION N/A 04/21/2016   Procedure: Left Heart Cath and Coronary Angiography;  Surgeon: Candyce GORMAN Reek, MD;  Location: [facility] INVASIVE CV LAB;  Service: Cardiovascular;  Laterality: N/A;   IR ANGIOGRAM EXTREMITY LEFT  12/01/2023   IR GENERIC HISTORICAL  02/13/2016   IR RADIOLOGY PERIPHERAL GUIDED IV  START 02/13/2016 Ozell Specking, MD [facility]-INTERV RAD   IR GENERIC HISTORICAL  02/13/2016   IR US  GUIDE VASC ACCESS LEFT 02/13/2016 Ozell Specking, MD [facility]   IR RADIOLOGIST EVAL & MGMT  10/18/2023   IR RADIOLOGIST EVAL & MGMT  11/08/2023   LEFT HEART CATH AND CORONARY ANGIOGRAPHY N/A 01/06/2023   Procedure: LEFT HEART CATH AND CORONARY ANGIOGRAPHY;  Surgeon: Verlin Lonni BIRCH, MD;  Location: [facility] INVASIVE CV LAB;  Service: Cardiovascular;  Laterality: N/A;   Patient Active Problem List   Diagnosis Date Noted   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) 04/04/2024   Coronary arteriosclerosis in native artery 03/28/2024   Hypertensive heart failure (HCC) 03/28/2024   Above-knee amputation (HCC)  03/28/2024   Allergic rhinitis 03/28/2024   Anemia of chronic disease 03/28/2024   Atherosclerosis of aorta 03/28/2024   Edema, unspecified 03/28/2024   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/28/2024   Duodenogastric reflux 03/28/2024   Gastro-esophageal reflux disease without esophagitis 03/28/2024   Hearing loss 03/28/2024   Hereditary disorder of nervous system 03/28/2024   Pure hypercholesterolemia 03/28/2024   Mild intermittent asthma 03/28/2024   Mitral valve disorder 03/28/2024   Morbid obesity (HCC) 03/28/2024   Phantom limb syndrome with pain (HCC) 03/28/2024   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   Stage 3a chronic kidney disease (HCC) 03/28/2024   Stenosis of superficial femoral artery 03/28/2024   PAD (peripheral artery disease) 01/04/2024   Hypertrophic obstructive cardiomyopathy (HCC) 06/10/2022   Stenosis of left subclavian artery 05/28/2021   Aneurysm artery, 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/2021   Memory loss 11/23/2017   Dysphonia 05/17/2017   Laryngopharyngeal reflux (LPR) 05/17/2017   Throat clearing 05/17/2017   Takotsubo cardiomyopathy    Angina pectoris 04/21/2016   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   Dyslipidemia    PVC's (premature ventricular contractions) 08/14/2014   Essential hypertension, benign 11/01/2013   Peripheral vascular disease 11/01/2013   Symptomatic mammary hypertrophy 08/16/2012    ONSET DATE: 04/04/2024 (referral)   REFERRING DIAG: R41.3 (ICD-10-CM) - Memory loss I67.1 (ICD-10-CM) - Intracranial aneurysm Z89.611 (ICD-10-CM) - 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 Went to the [facility] yesterday and her abs are sore. I think I did too much weight on my crunches. No falls. Legs are feeling better today.   Took her BP at home this AM and her diastolic was 41. Denies lightheadedness/dizziness at the moment but does report lightheadedness at home while sitting down and an off feeling while standing up at times.    Pt accompanied by: Son, Ananias (in lobby)  PERTINENT HISTORY: hypertension, hyperlipidemia, coronary artery disease, carotid stenosis, PAD with right AKA  PAIN:  Are you having pain? Yes: NPRS scale: 1/10 Pain location: Abdomen  Pain description: Phantom pain Aggravating factors: none  Relieving factors: Pain medicine   PRECAUTIONS: Fall  RED FLAGS: None   WEIGHT BEARING RESTRICTIONS: No  FALLS: Has patient fallen in last 6 months? No  LIVING ENVIRONMENT: Lives with: lives alone Lives in: House/apartment Stairs: Yes: External: ramped entry steps; on right going up, on left going up, and can reach both Has following equipment at home: Single point cane, Walker - 2 wheeled, Wheelchair (manual), shower chair, Grab bars, and Ramped entry  PLOF: Requires assistive device for independence  PATIENT GOALS: I guess walking better   OBJECTIVE:  Note: Objective measures were completed at Evaluation unless otherwise noted.  DIAGNOSTIC FINDINGS: MRA of head from 04/20/24 IMPRESSION: This MR angiogram of the intracranial arteries shows the following: 6 x 5 mm aneurysm arising from the distal clinoid segment of the left internal carotid artery appears unchanged compared to the 03/31/2023 MR angiogram. Intracranial stenosis as detailed above.  This most significantly affects the proximal V4 segment of the left vertebral artery.  Milder stenosis in the left middle and right posterior cerebral arteries  is not hemodynamically significant.  The intracranial stenosis appears unchanged compared to the 03/31/2023 MRI  COGNITION: Overall cognitive status: Impaired and History of cognitive impairments - at baseline MOCA: 25/30    SENSATION: Pt reports numbness/tingling in BLEs   EDEMA: Pt reports frequent distal swelling in LLE    POSTURE: rounded shoulders, forward head, and increased thoracic kyphosis  LOWER EXTREMITY ROM:     Active  Right Eval Left Eval  Hip flexion    Hip extension    Hip abduction    Hip adduction    Hip internal rotation    Hip external rotation    Knee flexion    Knee extension    Ankle dorsiflexion    Ankle plantarflexion    Ankle inversion    Ankle eversion     (Blank rows = not tested)  LOWER EXTREMITY MMT:    MMT Right Eval Left Eval  Hip flexion    Hip extension    Hip abduction    Hip adduction    Hip internal rotation    Hip external rotation    Knee flexion    Knee extension    Ankle dorsiflexion    Ankle plantarflexion    Ankle inversion    Ankle eversion    (Blank rows = not tested)  BED MOBILITY:  Not tested   TRANSFERS: Sit to stand: SBA  Assistive device utilized: Environmental Consultant - 2 wheeled     Stand to sit: SBA  Assistive device utilized: Environmental Consultant - 2 wheeled      RAMP:  Not tested  CURB:  Not tested  STAIRS: Not tested GAIT: Gait pattern: step through pattern, decreased step length- Left, decreased stance time- Right, decreased stride length, decreased hip/knee flexion- Right, circumduction- Right, Right hip hike, lateral  hip instability, lateral lean- Left, decreased trunk rotation, wide BOS, and poor foot clearance- Right Distance walked: Various clinic distances  Assistive device utilized: Walker - 2 wheeled Level of assistance: SBA Comments: Pt demonstrates absent knee flexion on RLE and diminished knee flexion on LLE. Inconsistent foot placement noted w/RLE   FUNCTIONAL TESTS:        CURRENT PROSTHETIC  WEAR ASSESSMENT: Patient is independent with: skin check, residual limb care, care of non-amputated limb, prosthetic cleaning, ply sock cleaning, correct ply sock adjustment, and proper wear schedule/adjustment Patient is dependent with: proper weight-bearing schedule/adjustment Donning prosthesis: Complete Independence Doffing prosthesis: Complete Independence Prosthetic wear tolerance: 5 hours/day, 0-7 days/week Prosthetic weight bearing tolerance: 20-30 minutes Edema: None  Residual limb condition: Unable to assess on eval  Prosthetic description: Suction socket w/SACH foot, hydraulic knee, endoskeletal appearance  K code/activity level with prosthetic use: Level 2   VITALS  Vitals:   05/17/24 1109 05/17/24 1115  BP: (!) 138/44 (!) 128/45  Pulse: (!) 59 (!) 54                                                                                                                                TREATMENT:    Self-care/home management  Assessed vitals in LUE while seated (see above) and diastolic BP low. Pt inconsistent with reporting hypotensive symptoms, so for safety decided to halt PT session today. Pt reports she ate this AM and has been drinking fluids today. Spoke w/pt's son and encouraged him to contact pt's PCP regarding low reading and continue to monitor at home. Son verbalized understanding.     PATIENT EDUCATION: Education details: See self-care above  Person educated: Patient Education method: Explanation, Demonstration, and Handouts Education comprehension: verbalized understanding, returned demonstration, and needs further education  HOME EXERCISE PROGRAM: Desensitization Techniques: -Light touch/pressure:  using fingertip pressure to slowly move up small segments of the affected body part until outside the area of pain and then back to where you started -Deep pressure:  using fingertips to squeeze in small segments starting outside the affected area, crossing over the  affected area, and then to the other side of the affected area -Tapping:  fingertips tap with firm pressure over the affected area, can move around the affected area as well -Brushing/scratching:  use fingertips to brush/scratch over and around the affected area -Textures:  use soft and rough textured items like cotton balls vs wash cloths to brush or rub with light into firm pressure over and around the affected area  Repeat these 3-4x per day.   GOALS: Goals reviewed with patient? Yes  SHORT TERM GOALS: Target date: 06/08/2024   Pt will be independent with initial HEP for improved strength, balance, transfers and gait.  Baseline: Goal status: INITIAL  2.  Pt will improve 5 x STS to less than or equal to 50 seconds w/improved weightbearing on R side to demonstrate improved functional strength and transfer efficiency.  Baseline: 61.47s w/BUE support  Goal status: INITIAL  3.  Pt will improve gait velocity to at least 1.55 ft/s w/LRAD for improved gait efficiency and independence  Baseline: 1.25 ft/s w/RW Goal status: INITIAL  4.  Berg to be assessed and LTG updated  Baseline:  Goal status: INITIAL   LONG TERM GOALS: Target date: 06/22/2024   Pt will be independent with final HEP and [employer] schedule for improved strength, balance, transfers and gait.  Baseline:  Goal status: INITIAL  2.  Pt will improve 5 x STS to less than or equal to 40 seconds w/improved weightbearing on RLE to demonstrate improved functional strength and transfer efficiency.   Baseline: 61.47s w/BUE support Goal status: INITIAL  3.  Pt will improve gait velocity to at least 1.8 ft/s w/LRAD for improved gait efficiency and reduced fall risk  Baseline: 1.25 ft/s w/RW Goal status: INITIAL  4.  Berg goal  Baseline:  Goal status: INITIAL    ASSESSMENT:  CLINICAL IMPRESSION: Arrived no charge due to diastolic hypotension. Encouraged pt to contact PCP regarding BP management.    OBJECTIVE  IMPAIRMENTS: Abnormal gait, decreased activity tolerance, decreased balance, decreased cognition, decreased coordination, decreased endurance, decreased mobility, difficulty walking, decreased strength, decreased safety awareness, impaired sensation, improper body mechanics, prosthetic dependency , and pain  ACTIVITY LIMITATIONS: carrying, lifting, bending, standing, squatting, stairs, transfers, reach over head, hygiene/grooming, locomotion level, and caring for others  PARTICIPATION LIMITATIONS: meal prep, cleaning, laundry, driving, shopping, community activity, and yard work  PERSONAL FACTORS: Age, Fitness, Past/current experiences, Transportation, and 1-2 comorbidities: R AKA and  are also affecting patient's functional outcome.   REHAB POTENTIAL: Fair Due to time since amputation and mild cognitive impairment   CLINICAL DECISION MAKING: Evolving/moderate complexity  EVALUATION COMPLEXITY: Moderate  PLAN:  PT FREQUENCY: 2x/week  PT DURATION: 6 weeks  PLANNED INTERVENTIONS: [id number]- PT Re-evaluation, 97750- Physical Performance Testing, 97110-Therapeutic exercises, 97530- Therapeutic activity, [id number]- Neuromuscular re-education, 97535- Self Care, [id number]- Manual therapy, [id number]- Gait training, [id number]- Prosthetic Initial , [id number]- Orthotic/Prosthetic subsequent, [id number]- Aquatic Therapy, [phone]- Electrical stimulation (manual), Patient/Family education, Balance training, Stair training, Vestibular training, and DME instructions  PLAN FOR NEXT SESSION: Check vitals. Fleurette Levins and update goal. HEP for functional strength: Sit to stands, squats, monsters, reaching out of BOS. Countertop reaching and standing tolerance    Shalyn Koral E Dessie Delcarlo, PT, DPT 05/17/2024, 11:30 AM

## 2024-05-22 ENCOUNTER — Ambulatory Visit: Admitting: Physical Therapy

## 2024-05-22 VITALS — BP 169/53 | HR 58

## 2024-05-22 DIAGNOSIS — M6281 Muscle weakness (generalized): Secondary | ICD-10-CM

## 2024-05-22 DIAGNOSIS — R2681 Unsteadiness on feet: Secondary | ICD-10-CM

## 2024-05-22 DIAGNOSIS — R2689 Other abnormalities of gait and mobility: Secondary | ICD-10-CM

## 2024-05-22 NOTE — Therapy (Signed)
 OUTPATIENT PHYSICAL THERAPY NEURO TREATMENT    Patient Name: [name] MRN: [id number] DOB:Jun 09, 1941, 83 y.o., female Today's Date: 05/22/2024   PCP: Vernon Velna SAUNDERS, MD  REFERRING PROVIDER: Onita Duos, MD  END OF SESSION:  PT End of Session - 05/22/24 1403     Visit Number 3    Number of Visits 13    Date for Recertification  06/29/24    Authorization Type [employer] Dual Complete    PT Start Time 1401    PT Stop Time 1446    PT Time Calculation (min) 45 min    Equipment Utilized During Treatment Other (comment);Gait belt   R AKA prosthetic   Activity Tolerance Patient tolerated treatment well    Behavior During Therapy WFL for tasks assessed/performed          Past Medical History:  Diagnosis Date   CAD in native artery    a. 04/2016 NSTEMI with cath showing 25% MCx, 75% DX1 lesion and moderate LVD 35-45% out of proportion to CAD and in a pattern of Takotsubo CM (no clear stressor)   Carotid stenosis    Carotid Doppler showed 40 to 59% stenosis of the right carotid artery and less than 50% stenosis of the right common carotid artery.  There is 1 to 39% stenosis of the left internal carotid artery and greater than 50% stenosis of the common carotid artery on the left.  There is also left subclavian artery stenosis. by dopplers 05/2021   Dyslipidemia    GERD (gastroesophageal reflux disease)    HOCM (hypertrophic obstructive cardiomyopathy) (HCC)    Hypercholesteremia    Hypertension    Hypertensive heart disease    initially she was thought to have a HOCM variant by cath with increased LVOT gradient but echo showed only a gradient across the LVOT and SAM and cardiac MRI did not show any late gad enhancement and only a LVOT gradient with 15mm BSH.  There was some mild SAM and mild MR.  EF normal at 76%.     LVH (left ventricular hypertrophy)    a. cMRI 2018 - showing moderate Basal septal hypertrophy not classic for HOCM morphology with no delayed gadolium  uptake to suggest myofibrillar disarray, + SAM with small LVOT gradient in regard to physiology and mild MR.   Mild aortic stenosis    NSTEMI (non-ST elevated myocardial infarction) (HCC) 04/2016   secondary to stress CM - Takotsubo CM   PAC (premature atrial contraction)    Event monitors 10/2013 and 08/2014 showed NSR, ST, PACs, PVCs.    PAD (peripheral artery disease) 11/2005   popliteal bypass failed S/P R BKA, converted to AKA 11/2006   Posterior communicating artery aneurysm    followed by Dr Unice   PVC's (premature ventricular contractions)    Event monitors 10/2013 and 08/2014 showed NSR, ST, PACs, PVCs.    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   EF normalized on repeat echo   Past Surgical History:  Procedure Laterality Date   ABDOMINAL HYSTERECTOMY     BELOW KNEE LEG AMPUTATION     converted to AKA 5/08   CARDIAC CATHETERIZATION N/A 04/21/2016   Procedure: Left Heart Cath and Coronary Angiography;  Surgeon: Candyce GORMAN Reek, MD;  Location: [facility] INVASIVE CV LAB;  Service: Cardiovascular;  Laterality: N/A;   IR ANGIOGRAM EXTREMITY LEFT  12/01/2023   IR GENERIC HISTORICAL  02/13/2016   IR RADIOLOGY PERIPHERAL GUIDED IV START 02/13/2016 Ozell Specking, MD [facility]-INTERV RAD   IR [facility]  HISTORICAL  02/13/2016   IR US  GUIDE VASC ACCESS LEFT 02/13/2016 Ozell Specking, MD [facility]INTER[facility]   IR RADIOLOGIST EVAL & MGMT  10/18/2023   IR RADIOLOGIST EVAL & MGMT  11/08/2023   LEFT HEART CATH AND CORONARY ANGIOGRAPHY N/A 01/06/2023   Procedure: LEFT HEART CATH AND CORONARY ANGIOGRAPHY;  Surgeon: Verlin Lonni BIRCH, MD;  Location: [facility] INVASIVE CV LAB;  Service: Cardiovascular;  Laterality: N/A;   Patient Active Problem List   Diagnosis Date Noted   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) 04/04/2024   Coronary arteriosclerosis in native artery 03/28/2024   Hypertensive heart failure (HCC) 03/28/2024   Above-knee amputation (HCC) 03/28/2024   Allergic rhinitis 03/28/2024   Anemia of  chronic disease 03/28/2024   Atherosclerosis of aorta 03/28/2024   Edema, unspecified 03/28/2024   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/28/2024   Duodenogastric reflux 03/28/2024   Gastro-esophageal reflux disease without esophagitis 03/28/2024   Hearing loss 03/28/2024   Hereditary disorder of nervous system 03/28/2024   Pure hypercholesterolemia 03/28/2024   Mild intermittent asthma 03/28/2024   Mitral valve disorder 03/28/2024   Morbid obesity (HCC) 03/28/2024   Phantom limb syndrome with pain (HCC) 03/28/2024   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   Stage 3a chronic kidney disease (HCC) 03/28/2024   Stenosis of superficial femoral artery 03/28/2024   PAD (peripheral artery disease) 01/04/2024   Hypertrophic obstructive cardiomyopathy (HCC) 06/10/2022   Stenosis of left subclavian artery 05/28/2021   Aneurysm artery, neck 05/21/2021   Memory loss 11/23/2017   Dysphonia 05/17/2017   Laryngopharyngeal reflux (LPR) 05/17/2017   Throat clearing 05/17/2017   Takotsubo cardiomyopathy    Angina pectoris 04/21/2016   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   Dyslipidemia    PVC's (premature ventricular contractions) 08/14/2014   Essential hypertension, benign 11/01/2013   Peripheral vascular disease 11/01/2013   Symptomatic mammary hypertrophy 08/16/2012    ONSET DATE: 04/04/2024 (referral)   REFERRING DIAG: R41.3 (ICD-10-CM) - Memory loss I67.1 (ICD-10-CM) - Intracranial aneurysm Z89.611 (ICD-10-CM) - 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 States she saw her PCP on Friday regarding her BP and had some medications changed. Feels okay  but has had a lingering headache and is a bit dizzy today.   Took her BP at home this AM and her diastolic was 50.    Pt accompanied by: Son, Ananias (in lobby)  PERTINENT HISTORY: hypertension, hyperlipidemia, coronary artery disease, carotid stenosis, PAD with right AKA  PAIN:  Are you having pain? Yes: NPRS scale: 1/10 Pain location: headache  Pain description: Phantom pain Aggravating factors: none  Relieving factors: Pain medicine   PRECAUTIONS: Fall  RED FLAGS: None   WEIGHT BEARING RESTRICTIONS: No  FALLS: Has patient fallen in last 6 months? No  LIVING ENVIRONMENT: Lives with: lives alone Lives in: House/apartment Stairs: Yes: External: ramped entry steps; on right going up, on left going up, and can reach both Has following equipment at home: Single point cane, Walker - 2 wheeled, Wheelchair (manual), shower chair, Grab bars, and Ramped entry  PLOF: Requires assistive device for independence  PATIENT GOALS: I guess walking better   OBJECTIVE:  Note: Objective measures were completed at Evaluation unless otherwise noted.  DIAGNOSTIC FINDINGS: MRA of head from 04/20/24 IMPRESSION: This MR angiogram of the intracranial arteries shows the following: 6 x 5 mm aneurysm arising from the distal clinoid segment of the left internal carotid artery appears unchanged compared to the 03/31/2023 MR angiogram. Intracranial stenosis as detailed above.  This most significantly affects the proximal V4 segment of the left vertebral artery.  Milder stenosis in the left middle and right posterior cerebral arteries is not hemodynamically significant.  The intracranial stenosis appears unchanged compared to the 03/31/2023 MRI  COGNITION: Overall cognitive status: Impaired and History of cognitive impairments - at baseline MOCA: 25/30     SENSATION: Pt reports numbness/tingling in BLEs   EDEMA: Pt reports frequent distal swelling in LLE    POSTURE: rounded shoulders, forward head, and increased thoracic kyphosis  LOWER EXTREMITY ROM:     Active  Right Eval Left Eval  Hip flexion    Hip extension    Hip abduction    Hip adduction    Hip internal rotation    Hip external rotation    Knee flexion    Knee extension    Ankle dorsiflexion    Ankle plantarflexion    Ankle inversion    Ankle eversion     (Blank rows = not tested)  LOWER EXTREMITY MMT:    MMT Right Eval Left Eval  Hip flexion    Hip extension    Hip abduction    Hip adduction    Hip internal rotation    Hip external rotation    Knee flexion    Knee extension    Ankle dorsiflexion    Ankle plantarflexion    Ankle inversion    Ankle eversion    (Blank rows = not tested)  BED MOBILITY:  Not tested   TRANSFERS: Sit to stand: SBA  Assistive device utilized: Environmental Consultant - 2 wheeled     Stand to sit: SBA  Assistive device utilized: Environmental Consultant - 2 wheeled      RAMP:  Not tested  CURB:  Not tested  STAIRS: Not tested GAIT: Gait pattern: step through pattern, decreased step length- Left, decreased stance time- Right, decreased stride length, decreased hip/knee flexion- Right, circumduction- Right, Right hip hike, lateral hip instability, lateral lean- Left, decreased trunk rotation, wide BOS, and poor foot clearance- Right Distance walked: Various clinic distances  Assistive device utilized: Walker - 2 wheeled Level of assistance: SBA Comments: Pt demonstrates absent knee flexion on  RLE and diminished knee flexion on LLE. Inconsistent foot placement noted w/RLE   FUNCTIONAL TESTS:   [facility] PT Assessment - 05/22/24 1406       Balance   Balance Assessed Yes      Standardized Balance Assessment   Standardized Balance Assessment Berg Balance Test      Berg Balance Test   Sit to Stand Needs minimal aid to stand or to stabilize   BUE  support, RW required to stabilize in stance   Standing Unsupported Able to stand 30 seconds unsupported    Sitting with Back Unsupported but Feet Supported on Floor or Stool Able to sit safely and securely 2 minutes    Stand to Sit Controls descent by using hands    Transfers Needs one person to assist   Required UE support on RW when standing from mat   Standing Unsupported with Eyes Closed Able to stand 10 seconds with supervision    Standing Unsupported with Feet Together Needs help to attain position but able to stand for 30 seconds with feet together    From Standing, Reach Forward with Outstretched Arm Loses balance while trying/requires external support    From Standing Position, Pick up Object from Floor Unable to try/needs assist to keep balance    From Standing Position, Turn to Look Behind Over each Shoulder Turn sideways only but maintains balance    Turn 360 Degrees Needs close supervision or verbal cueing   57.69s to L   Standing Unsupported, Alternately Place Feet on Step/Stool Needs assistance to keep from falling or unable to try    Standing Unsupported, One Foot in Front Needs help to step but can hold 15 seconds    Standing on One Leg Unable to try or needs assist to prevent fall    Total Score 19    Berg comment: 19/56; high fall risk              CURRENT PROSTHETIC WEAR ASSESSMENT: Patient is independent with: skin check, residual limb care, care of non-amputated limb, prosthetic cleaning, ply sock cleaning, correct ply sock adjustment, and proper wear schedule/adjustment Patient is dependent with: proper weight-bearing schedule/adjustment Donning prosthesis: Complete Independence Doffing prosthesis: Complete Independence Prosthetic wear tolerance: 5 hours/day, 0-7 days/week Prosthetic weight bearing tolerance: 20-30 minutes Edema: None  Residual limb condition: Unable to assess on eval  Prosthetic description: Suction socket w/SACH foot, hydraulic knee,  endoskeletal appearance  K code/activity level with prosthetic use: Level 2   VITALS  Vitals:   05/22/24 1405  BP: (!) 169/53  Pulse: (!) 58                                                                                                                                 TREATMENT:    Self-care/home management  Assessed vitals in LUE while seated (see above) and diastolic BP still low but improved from last session. Pt reports she feels a  little dizzy today but nothing major.   Physical Performance   [facility] PT Assessment - 05/22/24 1406       Balance   Balance Assessed Yes      Standardized Balance Assessment   Standardized Balance Assessment Berg Balance Test      Berg Balance Test   Sit to Stand Needs minimal aid to stand or to stabilize   BUE support, RW required to stabilize in stance   Standing Unsupported Able to stand 30 seconds unsupported    Sitting with Back Unsupported but Feet Supported on Floor or Stool Able to sit safely and securely 2 minutes    Stand to Sit Controls descent by using hands    Transfers Needs one person to assist   Required UE support on RW when standing from mat   Standing Unsupported with Eyes Closed Able to stand 10 seconds with supervision    Standing Unsupported with Feet Together Needs help to attain position but able to stand for 30 seconds with feet together    From Standing, Reach Forward with Outstretched Arm Loses balance while trying/requires external support    From Standing Position, Pick up Object from Floor Unable to try/needs assist to keep balance    From Standing Position, Turn to Look Behind Over each Shoulder Turn sideways only but maintains balance    Turn 360 Degrees Needs close supervision or verbal cueing   57.69s to L   Standing Unsupported, Alternately Place Feet on Step/Stool Needs assistance to keep from falling or unable to try    Standing Unsupported, One Foot in Front Needs help to step but can hold 15 seconds     Standing on One Leg Unable to try or needs assist to prevent fall    Total Score 19    Berg comment: 19/56; high fall risk          Ther Act  Floor transfers  Life alert  Functional goals  Fear of falling vs fear of not being found    PATIENT EDUCATION: Education details: Continue to monitor BP at home, see ther act above  Person educated: Patient Education method: Medical Illustrator Education comprehension: verbalized understanding, returned demonstration, and needs further education  HOME EXERCISE PROGRAM: Desensitization Techniques: -Light touch/pressure:  using fingertip pressure to slowly move up small segments of the affected body part until outside the area of pain and then back to where you started -Deep pressure:  using fingertips to squeeze in small segments starting outside the affected area, crossing over the affected area, and then to the other side of the affected area -Tapping:  fingertips tap with firm pressure over the affected area, can move around the affected area as well -Brushing/scratching:  use fingertips to brush/scratch over and around the affected area -Textures:  use soft and rough textured items like cotton balls vs wash cloths to brush or rub with light into firm pressure over and around the affected area  Repeat these 3-4x per day.   GOALS: Goals reviewed with patient? Yes  SHORT TERM GOALS: Target date: 06/08/2024   Pt will be independent with initial HEP for improved strength, balance, transfers and gait.  Baseline: Goal status: INITIAL  2.  Pt will improve 5 x STS to less than or equal to 50 seconds w/improved weightbearing on R side to demonstrate improved functional strength and transfer efficiency.   Baseline: 61.47s w/BUE support  Goal status: INITIAL  3.  Pt will improve gait velocity to at  least 1.55 ft/s w/LRAD for improved gait efficiency and independence  Baseline: 1.25 ft/s w/RW Goal status: INITIAL  4.  Berg  to be assessed and LTG updated  Baseline:  Goal status: MET   LONG TERM GOALS: Target date: 06/22/2024   Pt will be independent with final HEP and [employer] schedule for improved strength, balance, transfers and gait.  Baseline:  Goal status: INITIAL  2.  Pt will improve 5 x STS to less than or equal to 40 seconds w/improved weightbearing on RLE to demonstrate improved functional strength and transfer efficiency.   Baseline: 61.47s w/BUE support Goal status: INITIAL  3.  Pt will improve gait velocity to at least 1.8 ft/s w/LRAD for improved gait efficiency and reduced fall risk  Baseline: 1.25 ft/s w/RW Goal status: INITIAL  4.  Pt will improve Berg score to 25/56 for decreased fall risk  Baseline: 19/56 (11/10) Goal status: REVISED    ASSESSMENT:  CLINICAL IMPRESSION: Emphasis of skilled PT session on monitoring vitals, assessing balance via Lars and pt education regarding safety at home. Pt's diastolic BP higher today and pt reports she had her medications tweaked by PCP. Pt reports feeling more stable w/increased BP but does have slight headache. Pt encouraged to continue monitoring BP at home and writing values down to prepare for appointment w/cardiologist on 07/19/2024. Pt scored a 19/56 on Berg, indicative of high fall risk. Pt reports she has learned how to keep her R knee locked to prevent a fall as her left knee will buckle on her and she is scared of falling and not being found. Discussed potential to obtain LifeAlert necklace or Alexa for home and pt reports she will look into these. Continue POC.     OBJECTIVE IMPAIRMENTS: Abnormal gait, decreased activity tolerance, decreased balance, decreased cognition, decreased coordination, decreased endurance, decreased mobility, difficulty walking, decreased strength, decreased safety awareness, impaired sensation, improper body mechanics, prosthetic dependency , and pain  ACTIVITY LIMITATIONS: carrying, lifting, bending, standing,  squatting, stairs, transfers, reach over head, hygiene/grooming, locomotion level, and caring for others  PARTICIPATION LIMITATIONS: meal prep, cleaning, laundry, driving, shopping, community activity, and yard work  PERSONAL FACTORS: Age, Fitness, Past/current experiences, Transportation, and 1-2 comorbidities: R AKA and  are also affecting patient's functional outcome.   REHAB POTENTIAL: Fair Due to time since amputation and mild cognitive impairment   CLINICAL DECISION MAKING: Evolving/moderate complexity  EVALUATION COMPLEXITY: Moderate  PLAN:  PT FREQUENCY: 2x/week  PT DURATION: 6 weeks  PLANNED INTERVENTIONS: [id number]- PT Re-evaluation, 97750- Physical Performance Testing, 97110-Therapeutic exercises, 97530- Therapeutic activity, [id number]- Neuromuscular re-education, 97535- Self Care, [id number]- Manual therapy, [id number]- Gait training, [id number]- Prosthetic Initial , [id number]- Orthotic/Prosthetic subsequent, [id number]- Aquatic Therapy, [id number]- Electrical stimulation (manual), Patient/Family education, Balance training, Stair training, Vestibular training, and DME instructions  PLAN FOR NEXT SESSION: Check vitals. HEP for functional strength: Sit to stands, squats, monsters, reaching out of BOS. Countertop reaching and standing tolerance. Floor transfers.    Eboney Claybrook E Denna Fryberger, PT, DPT 05/22/2024, 3:27 PM

## 2024-05-24 ENCOUNTER — Ambulatory Visit: Admitting: Physical Therapy

## 2024-05-24 ENCOUNTER — Telehealth: Payer: Self-pay | Admitting: Cardiology

## 2024-05-24 VITALS — BP 121/40 | HR 67

## 2024-05-24 DIAGNOSIS — I493 Ventricular premature depolarization: Secondary | ICD-10-CM

## 2024-05-24 DIAGNOSIS — R2681 Unsteadiness on feet: Secondary | ICD-10-CM

## 2024-05-24 DIAGNOSIS — R531 Weakness: Secondary | ICD-10-CM

## 2024-05-24 DIAGNOSIS — R42 Dizziness and giddiness: Secondary | ICD-10-CM

## 2024-05-24 DIAGNOSIS — R2689 Other abnormalities of gait and mobility: Secondary | ICD-10-CM

## 2024-05-24 DIAGNOSIS — M6281 Muscle weakness (generalized): Secondary | ICD-10-CM

## 2024-05-24 NOTE — Therapy (Signed)
 OUTPATIENT PHYSICAL THERAPY NEURO TREATMENT    Patient Name: JENE HUQ MRN: [id number] DOB:February 07, 1941, 83 y.o., female Today's Date: 05/24/2024   PCP: Vernon Velna SAUNDERS, MD  REFERRING PROVIDER: Onita Duos, MD  END OF SESSION:  PT End of Session - 05/24/24 1450     Visit Number 4    Number of Visits 13    Date for Recertification  06/29/24    Authorization Type [employer] Dual Complete    PT Start Time 1447    PT Stop Time 1524   Hypotension   PT Time Calculation (min) 37 min    Equipment Utilized During Treatment Other (comment);Gait belt   R AKA prosthetic   Activity Tolerance Other (comment)   Diastolic hypotension   Behavior During Therapy WFL for tasks assessed/performed           Past Medical History:  Diagnosis Date   CAD in native artery    a. 04/2016 NSTEMI with cath showing 25% MCx, 75% DX1 lesion and moderate LVD 35-45% out of proportion to CAD and in a pattern of Takotsubo CM (no clear stressor)   Carotid stenosis    Carotid Doppler showed 40 to 59% stenosis of the right carotid artery and less than 50% stenosis of the right common carotid artery.  There is 1 to 39% stenosis of the left internal carotid artery and greater than 50% stenosis of the common carotid artery on the left.  There is also left subclavian artery stenosis. by dopplers 05/2021   Dyslipidemia    GERD (gastroesophageal reflux disease)    HOCM (hypertrophic obstructive cardiomyopathy) (HCC)    Hypercholesteremia    Hypertension    Hypertensive heart disease    initially she was thought to have a HOCM variant by cath with increased LVOT gradient but echo showed only a gradient across the LVOT and SAM and cardiac MRI did not show any late gad enhancement and only a LVOT gradient with 15mm BSH.  There was some mild SAM and mild MR.  EF normal at 76%.     LVH (left ventricular hypertrophy)    a. cMRI 2018 - showing moderate Basal septal hypertrophy not classic for HOCM morphology with  no delayed gadolium uptake to suggest myofibrillar disarray, + SAM with small LVOT gradient in regard to physiology and mild MR.   Mild aortic stenosis    NSTEMI (non-ST elevated myocardial infarction) (HCC) 04/2016   secondary to stress CM - Takotsubo CM   PAC (premature atrial contraction)    Event monitors 10/2013 and 08/2014 showed NSR, ST, PACs, PVCs.    PAD (peripheral artery disease) 11/2005   popliteal bypass failed S/P R BKA, converted to AKA 11/2006   Posterior communicating artery aneurysm    followed by Dr Unice   PVC's (premature ventricular contractions)    Event monitors 10/2013 and 08/2014 showed NSR, ST, PACs, PVCs.    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   EF normalized on repeat echo   Past Surgical History:  Procedure Laterality Date   ABDOMINAL HYSTERECTOMY     BELOW KNEE LEG AMPUTATION     converted to AKA 5/08   CARDIAC CATHETERIZATION N/A 04/21/2016   Procedure: Left Heart Cath and Coronary Angiography;  Surgeon: Candyce GORMAN Reek, MD;  Location: [facility] INVASIVE CV LAB;  Service: Cardiovascular;  Laterality: N/A;   IR ANGIOGRAM EXTREMITY LEFT  12/01/2023   IR GENERIC HISTORICAL  02/13/2016   IR RADIOLOGY PERIPHERAL GUIDED IV START 02/13/2016 Ozell Specking, MD [facility]INTERV RAD  IR GENERIC HISTORICAL  02/13/2016   IR US  GUIDE VASC ACCESS LEFT 02/13/2016 Ozell Specking, MD [facility]   IR RADIOLOGIST EVAL & MGMT  10/18/2023   IR RADIOLOGIST EVAL & MGMT  11/08/2023   LEFT HEART CATH AND CORONARY ANGIOGRAPHY N/A 01/06/2023   Procedure: LEFT HEART CATH AND CORONARY ANGIOGRAPHY;  Surgeon: Verlin Lonni BIRCH, MD;  Location: [facility] INVASIVE CV LAB;  Service: Cardiovascular;  Laterality: N/A;   Patient Active Problem List   Diagnosis Date Noted   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) 04/04/2024   Coronary arteriosclerosis in native artery 03/28/2024   Hypertensive heart failure (HCC) 03/28/2024   Above-knee amputation (HCC) 03/28/2024   Allergic rhinitis  03/28/2024   Anemia of chronic disease 03/28/2024   Atherosclerosis of aorta 03/28/2024   Edema, unspecified 03/28/2024   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/28/2024   Duodenogastric reflux 03/28/2024   Gastro-esophageal reflux disease without esophagitis 03/28/2024   Hearing loss 03/28/2024   Hereditary disorder of nervous system 03/28/2024   Pure hypercholesterolemia 03/28/2024   Mild intermittent asthma 03/28/2024   Mitral valve disorder 03/28/2024   Morbid obesity (HCC) 03/28/2024   Phantom limb syndrome with pain (HCC) 03/28/2024   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   Stage 3a chronic kidney disease (HCC) 03/28/2024   Stenosis of superficial femoral artery 03/28/2024   PAD (peripheral artery disease) 01/04/2024   Hypertrophic obstructive cardiomyopathy (HCC) 06/10/2022   Stenosis of left subclavian artery 05/28/2021   Aneurysm artery, neck 05/21/2021   Memory loss 11/23/2017   Dysphonia 05/17/2017   Laryngopharyngeal reflux (LPR) 05/17/2017   Throat clearing 05/17/2017   Takotsubo cardiomyopathy    Angina pectoris 04/21/2016   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   Dyslipidemia    PVC's (premature ventricular contractions) 08/14/2014   Essential hypertension, benign 11/01/2013   Peripheral vascular disease 11/01/2013   Symptomatic mammary hypertrophy 08/16/2012    ONSET DATE: 04/04/2024 (referral)   REFERRING DIAG: R41.3 (ICD-10-CM) - Memory loss I67.1 (ICD-10-CM) - Intracranial aneurysm Z89.611 (ICD-10-CM) - 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 States she did not go to the [facility] yesterday, was too cold.   Took  her BP at home this AM and her diastolic was 57. Has noticed her diastolic BP is lower in the PM than the AM.   Pt accompanied by: Son, Ananias (in lobby)  PERTINENT HISTORY: hypertension, hyperlipidemia, coronary artery disease, carotid stenosis, PAD with right AKA  PAIN:  Are you having pain? No  PRECAUTIONS: Fall  RED FLAGS: None   WEIGHT BEARING RESTRICTIONS: No  FALLS: Has patient fallen in last 6 months? No  LIVING ENVIRONMENT: Lives with: lives alone Lives in: House/apartment Stairs: Yes: External: ramped entry steps; on right going up, on left going up, and can reach both Has following equipment at home: Single point cane, Walker - 2 wheeled, Wheelchair (manual), shower chair, Grab bars, and Ramped entry  PLOF: Requires assistive device for independence  PATIENT GOALS: I guess walking better   OBJECTIVE:  Note: Objective measures were completed at Evaluation unless otherwise noted.  DIAGNOSTIC FINDINGS: MRA of head from 04/20/24 IMPRESSION: This MR angiogram of the intracranial arteries shows the following: 6 x 5 mm aneurysm arising from the distal clinoid segment of the left internal carotid artery appears unchanged compared to the 03/31/2023 MR angiogram. Intracranial stenosis as detailed above.  This most significantly affects the proximal V4 segment of the left vertebral artery.  Milder stenosis in the left middle and right posterior cerebral arteries is not hemodynamically significant.  The intracranial stenosis appears unchanged compared to the 03/31/2023 MRI  COGNITION: Overall cognitive status: Impaired and History of cognitive impairments - at baseline MOCA: 25/30    SENSATION: Pt reports numbness/tingling in BLEs   EDEMA: Pt reports frequent distal swelling in LLE    POSTURE: rounded shoulders, forward head, and  increased thoracic kyphosis  LOWER EXTREMITY ROM:     Active  Right Eval Left Eval  Hip flexion    Hip extension    Hip abduction    Hip adduction    Hip internal rotation    Hip external rotation    Knee flexion    Knee extension    Ankle dorsiflexion    Ankle plantarflexion    Ankle inversion    Ankle eversion     (Blank rows = not tested)  LOWER EXTREMITY MMT:    MMT Right Eval Left Eval  Hip flexion    Hip extension    Hip abduction    Hip adduction    Hip internal rotation    Hip external rotation    Knee flexion    Knee extension    Ankle dorsiflexion    Ankle plantarflexion    Ankle inversion    Ankle eversion    (Blank rows = not tested)  BED MOBILITY:  Not tested   TRANSFERS: Sit to stand: SBA  Assistive device utilized: Environmental Consultant - 2 wheeled     Stand to sit: SBA  Assistive device utilized: Environmental Consultant - 2 wheeled      RAMP:  Not tested  CURB:  Not tested  STAIRS: Not tested GAIT: Gait pattern: step through pattern, decreased step length- Left, decreased stance time- Right, decreased stride length, decreased hip/knee flexion- Right, circumduction- Right, Right hip hike, lateral hip instability, lateral lean- Left, decreased trunk rotation, wide BOS, and poor foot clearance- Right Distance walked: Various clinic distances  Assistive device utilized: Walker - 2 wheeled Level of assistance: SBA Comments: Pt demonstrates absent knee flexion on RLE and diminished knee flexion on LLE. Inconsistent foot placement noted w/RLE   FUNCTIONAL TESTS:  CURRENT PROSTHETIC WEAR ASSESSMENT: Patient is independent with: skin check, residual limb care, care of non-amputated limb, prosthetic cleaning, ply sock cleaning, correct ply sock adjustment, and proper wear schedule/adjustment Patient is dependent with: proper weight-bearing schedule/adjustment Donning prosthesis: Complete Independence Doffing prosthesis: Complete Independence Prosthetic wear  tolerance: 5 hours/day, 0-7 days/week Prosthetic weight bearing tolerance: 20-30 minutes Edema: None  Residual limb condition: Unable to assess on eval  Prosthetic description: Suction socket w/SACH foot, hydraulic knee, endoskeletal appearance  K code/activity level with prosthetic use: Level 2   VITALS  Vitals:   05/24/24 1453 05/24/24 1507 05/24/24 1510  BP: (!) 144/51 (!) 128/36 Comment: Following SciFit (!) 121/40  Pulse: 67 72 67                                                                                                                                  TREATMENT:    Self-care/home management/Ther Act  Assessed vitals in LUE while seated (see above) and diastolic BP still low but improved from last session. Reports feeling lightheaded today, has felt this way for a few hours.  SciFit multi-peaks level 5.0 for 8 minutes using BUE/BLEs for neural priming for reciprocal movement, dynamic cardiovascular warmup and increased amplitude of stepping. RPE of 6/10 following activity. Mod verbal cues to facilitate use of legs w/activity and not just arms. Required use of yellow strap to keep R foot in pedal. Pt required rest break 6.5 minutes into activity due to fatigue, reporting weakness in LUE. Reassessed vitals following activity (see above) and diastolic BP dropped to 36 mmHg. Rechecked after several minutes and diastolic slightly improved to 40 mmHg. Pt denied lightheadedness or dizziness following Scifit but reports she often gets lightheaded at home.  Escorted pt out of clinic w/RW and SBA and spoke to pt's son. Recommended pt continue to monitor BP at home and if she continues to be lightheaded/dizzy, go to ED. Also recommend pt call cardiologist to make them aware of diastolic BP dropping w/activity.    PATIENT EDUCATION: Education details: Continue to monitor BP at home, see ther act above  Person educated: Patient Education method: Medical Illustrator Education  comprehension: verbalized understanding, returned demonstration, and needs further education  HOME EXERCISE PROGRAM: Desensitization Techniques: -Light touch/pressure:  using fingertip pressure to slowly move up small segments of the affected body part until outside the area of pain and then back to where you started -Deep pressure:  using fingertips to squeeze in small segments starting outside the affected area, crossing over the affected area, and then to the other side of the affected area -Tapping:  fingertips tap with firm pressure over the affected area, can move around the affected area as well -Brushing/scratching:  use fingertips to brush/scratch over and around the affected area -Textures:  use soft and rough textured items like cotton balls vs wash cloths to brush or rub with light into firm pressure over and around the  affected area  Repeat these 3-4x per day.   GOALS: Goals reviewed with patient? Yes  SHORT TERM GOALS: Target date: 06/08/2024   Pt will be independent with initial HEP for improved strength, balance, transfers and gait.  Baseline: Goal status: INITIAL  2.  Pt will improve 5 x STS to less than or equal to 50 seconds w/improved weightbearing on R side to demonstrate improved functional strength and transfer efficiency.   Baseline: 61.47s w/BUE support  Goal status: INITIAL  3.  Pt will improve gait velocity to at least 1.55 ft/s w/LRAD for improved gait efficiency and independence  Baseline: 1.25 ft/s w/RW Goal status: INITIAL  4.  Berg to be assessed and LTG updated  Baseline: 19/56 (11/10) Goal status: MET   LONG TERM GOALS: Target date: 06/22/2024   Pt will be independent with final HEP and [employer] schedule for improved strength, balance, transfers and gait.  Baseline:  Goal status: INITIAL  2.  Pt will improve 5 x STS to less than or equal to 40 seconds w/improved weightbearing on RLE to demonstrate improved functional strength and transfer  efficiency.   Baseline: 61.47s w/BUE support Goal status: INITIAL  3.  Pt will improve gait velocity to at least 1.8 ft/s w/LRAD for improved gait efficiency and reduced fall risk  Baseline: 1.25 ft/s w/RW Goal status: INITIAL  4.  Pt will improve Berg score to 25/56 for decreased fall risk  Baseline: 19/56 (11/10) Goal status: REVISED    ASSESSMENT:  CLINICAL IMPRESSION: Session limited as pt reporting lightheadedness at beginning of session. Assessed vitals at beginning of session and diastolic BP low but within therapeutic range. However, following SciFit, pt's diastolic BP dropped to 36 mmHg and did not improve much w/seated rest. Pt reports she is staying hydrated and has been taking her medications as modified by PCP last week. Recommended pt contact cardiologist and make them aware of BP readings and go to ED if she continues to be symptomatic. Pt and son verbalized understanding.  Continue POC.     OBJECTIVE IMPAIRMENTS: Abnormal gait, decreased activity tolerance, decreased balance, decreased cognition, decreased coordination, decreased endurance, decreased mobility, difficulty walking, decreased strength, decreased safety awareness, impaired sensation, improper body mechanics, prosthetic dependency , and pain  ACTIVITY LIMITATIONS: carrying, lifting, bending, standing, squatting, stairs, transfers, reach over head, hygiene/grooming, locomotion level, and caring for others  PARTICIPATION LIMITATIONS: meal prep, cleaning, laundry, driving, shopping, community activity, and yard work  PERSONAL FACTORS: Age, Fitness, Past/current experiences, Transportation, and 1-2 comorbidities: R AKA and  are also affecting patient's functional outcome.   REHAB POTENTIAL: Fair Due to time since amputation and mild cognitive impairment   CLINICAL DECISION MAKING: Evolving/moderate complexity  EVALUATION COMPLEXITY: Moderate  PLAN:  PT FREQUENCY: 2x/week  PT DURATION: 6  weeks  PLANNED INTERVENTIONS: [id number]- PT Re-evaluation, 97750- Physical Performance Testing, 97110-Therapeutic exercises, 97530- Therapeutic activity, [id number]- Neuromuscular re-education, 97535- Self Care, [id number]- Manual therapy, [id number]- Gait training, [id number]- Prosthetic Initial , [id number]- Orthotic/Prosthetic subsequent, [id number]- Aquatic Therapy, [phone]- Electrical stimulation (manual), Patient/Family education, Balance training, Stair training, Vestibular training, and DME instructions  PLAN FOR NEXT SESSION: Check vitals. HEP for functional strength: Sit to stands, squats, monsters, reaching out of BOS. Countertop reaching and standing tolerance. Floor transfers.    Garey Alleva E Mirelle Biskup, PT, DPT 05/24/2024, 3:26 PM

## 2024-05-24 NOTE — Telephone Encounter (Signed)
 Called and spoke to patient's son, [name]. For about one week, the Patient's DBP has been dropping to 50.   BP's: Today 128/57, yesterday 144/50 132/63,  On 05/22/24 140/50, 122/55  While at Physical Therapy, (and only with exertion), pt is feeling DIZZY/Lightheaded and WEAK. They first contacted their PCP and were advised to cut their Carvedilol  dose in half (to take 6.25 mg BID). PCP asked them to get in touch with Cardiology for further rec's. Currently scheduled to see Dr. Santo on 07/19/2024 for yearly f/u. ER precautions reviewed with son, who verbalized understanding.

## 2024-05-24 NOTE — Telephone Encounter (Signed)
 Pt c/o BP issue: STAT if pt c/o blurred vision, one-sided weakness or slurred speech.  STAT if BP is GREATER than 180/120 TODAY.  STAT if BP is LESS than 90/60 and SYMPTOMATIC TODAY  1. What is your BP concern? Low diastolic number   2. Have you taken any BP medication today? Yes   3. What are your last 5 BP readings? Today 128/57, yesterday 144/50BP 132/63BP, 05/22/24 140/50BP, 122/55BP  4. Are you having any other symptoms (ex. Dizziness, headache, blurred vision, passed out)? Dizziness, headache, blurred vision     Pts son calling to say pt has been experiencing dizziness and low diastolic reading when doing physical therapy and moderate exercise. Pt was not symptomatic at time of call. Please advise.

## 2024-05-25 ENCOUNTER — Other Ambulatory Visit: Payer: Self-pay

## 2024-05-25 ENCOUNTER — Ambulatory Visit: Attending: Internal Medicine

## 2024-05-25 DIAGNOSIS — R42 Dizziness and giddiness: Secondary | ICD-10-CM

## 2024-05-25 DIAGNOSIS — I493 Ventricular premature depolarization: Secondary | ICD-10-CM

## 2024-05-25 NOTE — Telephone Encounter (Addendum)
 Called son, DPR, back about message. Informed him that patient should be fine wearing monitor during therapy.

## 2024-05-25 NOTE — Telephone Encounter (Signed)
 Patient called back to see if she can do therapy while having her heart monitor on. Please advise

## 2024-05-25 NOTE — Telephone Encounter (Addendum)
 Patient identification verified by 2 forms.  Interventions/Plan: -Pt wants to know can she continue with her therapy.  -Went over Dr. Milan recommendations.  -Pt verbalized understanding. Orders placed for tests.   Reviewed ED warning signs/precautions  Patient agrees with plan, no questions at this time

## 2024-05-25 NOTE — Progress Notes (Signed)
 Error

## 2024-05-25 NOTE — Progress Notes (Unsigned)
 Enrolled for [employer] to mail a ZIO XT long term holter monitor to the patients address on file.

## 2024-05-29 ENCOUNTER — Ambulatory Visit: Admitting: Physical Therapy

## 2024-05-29 VITALS — BP 148/40 | HR 63

## 2024-05-29 DIAGNOSIS — R2681 Unsteadiness on feet: Secondary | ICD-10-CM

## 2024-05-29 DIAGNOSIS — R2689 Other abnormalities of gait and mobility: Secondary | ICD-10-CM

## 2024-05-29 DIAGNOSIS — M6281 Muscle weakness (generalized): Secondary | ICD-10-CM

## 2024-05-29 NOTE — Therapy (Addendum)
 OUTPATIENT PHYSICAL THERAPY NEURO TREATMENT - DISCHARGE SUMMARY   Patient Name: [name] MRN: [id number] DOB:1940-11-20, 83 y.o., female Today's Date: 05/29/2024   PCP: Vernon Velna SAUNDERS, MD  REFERRING PROVIDER: Onita Duos, MD  PHYSICAL THERAPY DISCHARGE SUMMARY  Visits from Start of Care: 5  Current functional level related to goals / functional outcomes: Pt is mod I w/use of RW for short distances w/R AKA prosthetic    Remaining deficits: High fall risk, decreased activity tolerance, improper cardiovascular response.    Education / Equipment: HEP   Patient agrees to discharge. Patient goals were DC. Patient is being discharged due to a change in medical status.   END OF SESSION:  PT End of Session - 05/29/24 1418     Visit Number 5    Number of Visits 13    Date for Recertification  06/29/24    Authorization Type [employer] Dual Complete    PT Start Time 1406    PT Stop Time 1426   DC   PT Time Calculation (min) 20 min    Equipment Utilized During Treatment Other (comment);Gait belt   R AKA prosthetic   Activity Tolerance Other (comment)   Diastolic hypotension   Behavior During Therapy WFL for tasks assessed/performed            Past Medical History:  Diagnosis Date   CAD in native artery    a. 04/2016 NSTEMI with cath showing 25% MCx, 75% DX1 lesion and moderate LVD 35-45% out of proportion to CAD and in a pattern of Takotsubo CM (no clear stressor)   Carotid stenosis    Carotid Doppler showed 40 to 59% stenosis of the right carotid artery and less than 50% stenosis of the right common carotid artery.  There is 1 to 39% stenosis of the left internal carotid artery and greater than 50% stenosis of the common carotid artery on the left.  There is also left subclavian artery stenosis. by dopplers 05/2021   Dyslipidemia    GERD (gastroesophageal reflux disease)    HOCM (hypertrophic obstructive cardiomyopathy) (HCC)    Hypercholesteremia    Hypertension     Hypertensive heart disease    initially she was thought to have a HOCM variant by cath with increased LVOT gradient but echo showed only a gradient across the LVOT and SAM and cardiac MRI did not show any late gad enhancement and only a LVOT gradient with 15mm BSH.  There was some mild SAM and mild MR.  EF normal at 76%.     LVH (left ventricular hypertrophy)    a. cMRI 2018 - showing moderate Basal septal hypertrophy not classic for HOCM morphology with no delayed gadolium uptake to suggest myofibrillar disarray, + SAM with small LVOT gradient in regard to physiology and mild MR.   Mild aortic stenosis    NSTEMI (non-ST elevated myocardial infarction) (HCC) 04/2016   secondary to stress CM - Takotsubo CM   PAC (premature atrial contraction)    Event monitors 10/2013 and 08/2014 showed NSR, ST, PACs, PVCs.    PAD (peripheral artery disease) 11/2005   popliteal bypass failed S/P R BKA, converted to AKA 11/2006   Posterior communicating artery aneurysm    followed by Dr Unice   PVC's (premature ventricular contractions)    Event monitors 10/2013 and 08/2014 showed NSR, ST, PACs, PVCs.    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   EF normalized on repeat echo   Past Surgical History:  Procedure Laterality Date  ABDOMINAL HYSTERECTOMY     BELOW KNEE LEG AMPUTATION     converted to AKA 5/08   CARDIAC CATHETERIZATION N/A 04/21/2016   Procedure: Left Heart Cath and Coronary Angiography;  Surgeon: Candyce GORMAN Reek, MD;  Location: [facility] INVASIVE CV LAB;  Service: Cardiovascular;  Laterality: N/A;   IR ANGIOGRAM EXTREMITY LEFT  12/01/2023   IR GENERIC HISTORICAL  02/13/2016   IR RADIOLOGY PERIPHERAL GUIDED IV START 02/13/2016 Ozell Specking, MD [facility]   IR GENERIC HISTORICAL  02/13/2016   IR US  GUIDE VASC ACCESS LEFT 02/13/2016 Ozell Specking, MD [facility]   IR RADIOLOGIST EVAL & MGMT  10/18/2023   IR RADIOLOGIST EVAL & MGMT  11/08/2023   LEFT HEART CATH AND CORONARY ANGIOGRAPHY N/A 01/06/2023    Procedure: LEFT HEART CATH AND CORONARY ANGIOGRAPHY;  Surgeon: Verlin Lonni BIRCH, MD;  Location: [facility] INVASIVE CV LAB;  Service: Cardiovascular;  Laterality: N/A;   Patient Active Problem List   Diagnosis Date Noted   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) 04/04/2024   Coronary arteriosclerosis in native artery 03/28/2024   Hypertensive heart failure (HCC) 03/28/2024   Above-knee amputation (HCC) 03/28/2024   Allergic rhinitis 03/28/2024   Anemia of chronic disease 03/28/2024   Atherosclerosis of aorta 03/28/2024   Edema, unspecified 03/28/2024   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/28/2024   Duodenogastric reflux 03/28/2024   Gastro-esophageal reflux disease without esophagitis 03/28/2024   Hearing loss 03/28/2024   Hereditary disorder of nervous system 03/28/2024   Pure hypercholesterolemia 03/28/2024   Mild intermittent asthma 03/28/2024   Mitral valve disorder 03/28/2024   Morbid obesity (HCC) 03/28/2024   Phantom limb syndrome with pain (HCC) 03/28/2024   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   Stage 3a chronic kidney disease (HCC) 03/28/2024   Stenosis of superficial femoral artery 03/28/2024   PAD (peripheral artery disease) 01/04/2024   Hypertrophic obstructive cardiomyopathy (HCC) 06/10/2022   Stenosis of left subclavian artery 05/28/2021   Aneurysm artery, neck 05/21/2021   Memory loss 11/23/2017   Dysphonia 05/17/2017   Laryngopharyngeal reflux (LPR) 05/17/2017   Throat clearing 05/17/2017   Takotsubo cardiomyopathy    Angina pectoris 04/21/2016   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   Dyslipidemia    PVC's (premature ventricular contractions) 08/14/2014   Essential hypertension, benign 11/01/2013   Peripheral vascular disease 11/01/2013   Symptomatic mammary hypertrophy 08/16/2012    ONSET DATE: 04/04/2024 (referral)   REFERRING DIAG: R41.3 (ICD-10-CM) - Memory loss I67.1 (ICD-10-CM) - Intracranial  aneurysm Z89.611 (ICD-10-CM) - 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 States she started wearing Zio heart monitor a few days ago and is scheduled for an echo in December. Had another dizziness spell this morning and had to sit down for several minutes until it went away. No falls.   Pt accompanied by: Son, Ananias (in lobby)  PERTINENT HISTORY: hypertension, hyperlipidemia, coronary artery disease, carotid stenosis, PAD with right AKA  PAIN:  Are you having pain? No  PRECAUTIONS: Fall  RED FLAGS: None   WEIGHT BEARING RESTRICTIONS: No  FALLS: Has patient fallen in last 6 months? No  LIVING ENVIRONMENT: Lives with: lives alone Lives in: House/apartment Stairs: Yes: External: ramped entry steps; on right going up, on left going up, and can reach both Has following equipment at home: Single point cane, Walker - 2 wheeled, Wheelchair (manual), shower chair, Grab bars, and Ramped entry  PLOF: Requires assistive device for independence  PATIENT GOALS: I guess walking better   OBJECTIVE:  Note: Objective measures were completed at Evaluation unless otherwise noted.  DIAGNOSTIC FINDINGS: MRA of head from 04/20/24 IMPRESSION: This MR angiogram of the intracranial arteries shows the following: 6 x 5 mm aneurysm arising from the distal clinoid segment of the left internal carotid artery appears unchanged compared to the 03/31/2023 MR angiogram. Intracranial stenosis as detailed above.  This most significantly affects the  proximal V4 segment of the left vertebral artery.  Milder stenosis in the left middle and right posterior cerebral arteries is not hemodynamically significant.  The intracranial stenosis appears unchanged compared to the 03/31/2023 MRI  COGNITION: Overall cognitive status: Impaired and History of cognitive impairments - at baseline MOCA: 25/30    SENSATION: Pt reports numbness/tingling in BLEs   EDEMA: Pt reports frequent distal swelling in LLE    POSTURE: rounded shoulders, forward head, and increased thoracic kyphosis  LOWER EXTREMITY ROM:     Active  Right Eval Left Eval  Hip flexion    Hip extension    Hip abduction    Hip adduction    Hip internal rotation    Hip external rotation    Knee flexion    Knee extension    Ankle dorsiflexion    Ankle plantarflexion    Ankle inversion    Ankle eversion     (Blank rows = not tested)  LOWER EXTREMITY MMT:    MMT Right Eval Left Eval  Hip flexion    Hip extension    Hip abduction    Hip adduction    Hip internal rotation    Hip external rotation    Knee flexion    Knee extension    Ankle dorsiflexion    Ankle plantarflexion    Ankle inversion    Ankle eversion    (Blank rows = not tested)  BED MOBILITY:  Not tested   TRANSFERS: Sit to stand: SBA  Assistive device utilized: Environmental Consultant - 2 wheeled     Stand to sit: SBA  Assistive device utilized: Environmental Consultant - 2 wheeled      RAMP:  Not tested  CURB:  Not tested  STAIRS: Not tested GAIT: Gait pattern: step through pattern, decreased step length- Left, decreased stance time- Right, decreased stride length, decreased hip/knee flexion- Right, circumduction- Right, Right hip hike, lateral hip instability, lateral lean- Left, decreased trunk rotation, wide BOS, and poor foot clearance- Right Distance walked: Various clinic distances  Assistive device utilized: Walker - 2 wheeled Level of assistance: Modified independence Comments: Pt demonstrates absent knee  flexion  on RLE and diminished knee flexion on LLE. Inconsistent foot placement noted w/RLE   FUNCTIONAL TESTS:         CURRENT PROSTHETIC WEAR ASSESSMENT: Patient is independent with: skin check, residual limb care, care of non-amputated limb, prosthetic cleaning, ply sock cleaning, correct ply sock adjustment, and proper wear schedule/adjustment Patient is dependent with: proper weight-bearing schedule/adjustment Donning prosthesis: Complete Independence Doffing prosthesis: Complete Independence Prosthetic wear tolerance: 5 hours/day, 0-7 days/week Prosthetic weight bearing tolerance: 20-30 minutes Edema: None  Residual limb condition: Unable to assess on eval  Prosthetic description: Suction socket w/SACH foot, hydraulic knee, endoskeletal appearance  K code/activity level with prosthetic use: Level 2   VITALS  Vitals:   05/29/24 1410  BP: (!) 148/40  Pulse: 63                                                                                                                                  TREATMENT:    Self-care/home management Pt reported feeling lightheaded while walking into clinic, so assessed vitals in LUE while seated (see above) and diastolic BP continues to be in low range. Due to symptomatic hypotension, recommend pt DC from PT until she has echo performed and meets with cardiologist and then may return to PT. Pt in agreement with this. Recommended pt continue to move as much as possible at home but keep a log of her BP and when she becomes dizzy.  Educated pt on spoons theory for reduced fall risk at home and energy conservation. Pt verbalized understanding.  Escorted pt out of clinic and informed pt's son, Ananias, of plan to DC from PT today. Ananias also in agreement with this.   PATIENT EDUCATION: Education details: Continue to monitor BP at home, see self-care  Person educated: Patient Education method: Medical Illustrator Education comprehension:  verbalized understanding, returned demonstration, and needs further education  HOME EXERCISE PROGRAM: Desensitization Techniques: -Light touch/pressure:  using fingertip pressure to slowly move up small segments of the affected body part until outside the area of pain and then back to where you started -Deep pressure:  using fingertips to squeeze in small segments starting outside the affected area, crossing over the affected area, and then to the other side of the affected area -Tapping:  fingertips tap with firm pressure over the affected area, can move around the affected area as well -Brushing/scratching:  use fingertips to brush/scratch over and around the affected area -Textures:  use soft and rough textured items like cotton balls vs wash cloths to brush or rub with light into firm pressure over and around the affected area  Repeat these 3-4x per day.   GOALS: ALL GOALS DC  Goals reviewed with patient? Yes  SHORT TERM GOALS: Target date: 06/08/2024   Pt will be independent with initial HEP for improved strength, balance, transfers and gait.  Baseline: Goal status: INITIAL  2.  Pt will improve 5  x STS to less than or equal to 50 seconds w/improved weightbearing on R side to demonstrate improved functional strength and transfer efficiency.   Baseline: 61.47s w/BUE support  Goal status: INITIAL  3.  Pt will improve gait velocity to at least 1.55 ft/s w/LRAD for improved gait efficiency and independence  Baseline: 1.25 ft/s w/RW Goal status: INITIAL  4.  Berg to be assessed and LTG updated  Baseline: 19/56 (11/10) Goal status: MET   LONG TERM GOALS: Target date: 06/22/2024   Pt will be independent with final HEP and [employer] schedule for improved strength, balance, transfers and gait.  Baseline:  Goal status: INITIAL  2.  Pt will improve 5 x STS to less than or equal to 40 seconds w/improved weightbearing on RLE to demonstrate improved functional strength and transfer  efficiency.   Baseline: 61.47s w/BUE support Goal status: INITIAL  3.  Pt will improve gait velocity to at least 1.8 ft/s w/LRAD for improved gait efficiency and reduced fall risk  Baseline: 1.25 ft/s w/RW Goal status: INITIAL  4.  Pt will improve Berg score to 25/56 for decreased fall risk  Baseline: 19/56 (11/10) Goal status: REVISED    ASSESSMENT:  CLINICAL IMPRESSION: Emphasis of skilled PT session on discussing plan to DC from PT, monitoring vitals and educating pt on spoons theory. Pt's diastolic BP continues to be hypotensive and pt more symptomatic today. Pt wearing Zio heart monitor and is scheduled for echo in December. Due to this, recommend pt pause PT for gait training until BP more stable and pt in agreement with this. Recommended pt return to PT once she meets with cardiologist in January. Pt continues to be a high fall risk and educated pt on spoons theory for improved energy conservation and safety at home as pt lives alone. Pt verbalized understanding and is in agreement to DC this date.   OBJECTIVE IMPAIRMENTS: Abnormal gait, decreased activity tolerance, decreased balance, decreased cognition, decreased coordination, decreased endurance, decreased mobility, difficulty walking, decreased strength, decreased safety awareness, impaired sensation, improper body mechanics, prosthetic dependency , and pain  ACTIVITY LIMITATIONS: carrying, lifting, bending, standing, squatting, stairs, transfers, reach over head, hygiene/grooming, locomotion level, and caring for others  PARTICIPATION LIMITATIONS: meal prep, cleaning, laundry, driving, shopping, community activity, and yard work  PERSONAL FACTORS: Age, Fitness, Past/current experiences, Transportation, and 1-2 comorbidities: R AKA and  are also affecting patient's functional outcome.   REHAB POTENTIAL: Fair Due to time since amputation and mild cognitive impairment   CLINICAL DECISION MAKING: Evolving/moderate  complexity  EVALUATION COMPLEXITY: Moderate  PLAN:  PT FREQUENCY: 2x/week  PT DURATION: 6 weeks  PLANNED INTERVENTIONS: [id number]- PT Re-evaluation, 97750- Physical Performance Testing, 97110-Therapeutic exercises, 97530- Therapeutic activity, [id number]- Neuromuscular re-education, 97535- Self Care, [id number]- Manual therapy, [id number]- Gait training, [phone]- Prosthetic Initial , [phone]- Orthotic/Prosthetic subsequent, [phone]- Aquatic Therapy, [phone]- Electrical stimulation (manual), Patient/Family education, Balance training, Stair training, Vestibular training, and DME instructions   Marlon BRAVO Dauntae Derusha, PT, DPT 05/29/2024, 3:43 PM

## 2024-05-31 ENCOUNTER — Ambulatory Visit: Admitting: Physical Therapy

## 2024-06-05 ENCOUNTER — Ambulatory Visit: Admitting: Physical Therapy

## 2024-06-07 ENCOUNTER — Ambulatory Visit: Admitting: Physical Therapy

## 2024-06-12 ENCOUNTER — Ambulatory Visit: Admitting: Physical Therapy

## 2024-06-14 ENCOUNTER — Ambulatory Visit: Admitting: Physical Therapy

## 2024-06-15 ENCOUNTER — Other Ambulatory Visit: Payer: Self-pay | Admitting: Internal Medicine

## 2024-06-15 ENCOUNTER — Ambulatory Visit
Admission: RE | Admit: 2024-06-15 | Discharge: 2024-06-15 | Disposition: A | Source: Ambulatory Visit | Attending: Internal Medicine

## 2024-06-15 DIAGNOSIS — R1313 Dysphagia, pharyngeal phase: Secondary | ICD-10-CM

## 2024-06-19 ENCOUNTER — Ambulatory Visit: Admitting: Physical Therapy

## 2024-06-20 ENCOUNTER — Other Ambulatory Visit: Payer: Self-pay | Admitting: Nurse Practitioner

## 2024-06-20 DIAGNOSIS — R634 Abnormal weight loss: Secondary | ICD-10-CM

## 2024-06-21 ENCOUNTER — Ambulatory Visit: Admitting: Physical Therapy

## 2024-06-21 ENCOUNTER — Telehealth (HOSPITAL_BASED_OUTPATIENT_CLINIC_OR_DEPARTMENT_OTHER): Payer: Self-pay

## 2024-06-21 NOTE — Telephone Encounter (Signed)
° °  Name: [name]  DOB: May 18, 1941  MRN: [id number]  Primary Cardiologist: Wilbert Bihari, MD  Chart reviewed as part of pre-operative protocol coverage. Because of [name]'s past medical history and time since last visit, she will require a follow-up in-office visit in order to better assess preoperative cardiovascular risk.  Pre-op covering staff: - Please schedule appointment and call patient to inform them. If patient already had an upcoming appointment within acceptable timeframe, please add pre-op clearance to the appointment notes so provider is aware. - Please contact requesting surgeon's office via preferred method (i.e, phone, fax) to inform them of need for appointment prior to surgery.  Patient has appointment with Dr. Santo on 07/19/24  Regarding ASA therapy, we recommend continuation of ASA throughout the perioperative period.  However, if the surgeon feels that cessation of ASA is required in the perioperative period, it may be stopped 5-7 days prior to surgery with a plan to resume it as soon as felt to be feasible from a surgical standpoint in the post-operative period.   Rollo FABIENE Louder, PA-C  06/21/2024, 10:51 AM

## 2024-06-21 NOTE — Telephone Encounter (Signed)
° °  Pre-operative Risk Assessment    Patient Name: [name]  DOB: 09-Jan-1941 MRN: [id number]   Date of last office visit: 02/09/23 with Dr. Santo  Date of next office visit: 07/19/24 with Dr. Santo  Request for Surgical Clearance    Procedure:  Colonoscopy and endoscopy   Date of Surgery:  Clearance TBD                                 Surgeon:  Dr. Kriss Socks Group or Practice Name:  [facility] Phone number:  [phone] Fax number:  [phone]   Type of Clearance Requested:   - Medical  - Pharmacy:  Hold Aspirin  not indicated   Type of Anesthesia:  propofol   Additional requests/questions:    SignedAugustin JONETTA Daring   06/21/2024, 10:40 AM

## 2024-06-21 NOTE — Telephone Encounter (Signed)
 Called patient to try to set up an office appointment for a pre-op clearance. Patient already has an appointement with Dr. Santo on 07/19/2024. He will do the clearance that day.

## 2024-06-25 ENCOUNTER — Ambulatory Visit: Payer: Self-pay | Admitting: Internal Medicine

## 2024-06-25 DIAGNOSIS — R42 Dizziness and giddiness: Secondary | ICD-10-CM | POA: Diagnosis not present

## 2024-06-25 DIAGNOSIS — I493 Ventricular premature depolarization: Secondary | ICD-10-CM

## 2024-06-26 ENCOUNTER — Other Ambulatory Visit: Payer: Self-pay

## 2024-06-26 DIAGNOSIS — I10 Essential (primary) hypertension: Secondary | ICD-10-CM

## 2024-06-26 DIAGNOSIS — I421 Obstructive hypertrophic cardiomyopathy: Secondary | ICD-10-CM

## 2024-06-28 MED ORDER — SPIRONOLACTONE 50 MG PO TABS: 50.0000 mg | ORAL_TABLET | Freq: Every day | ORAL | 0 refills | Status: AC

## 2024-06-29 ENCOUNTER — Inpatient Hospital Stay: Admission: RE | Admit: 2024-06-29

## 2024-06-29 DIAGNOSIS — R634 Abnormal weight loss: Secondary | ICD-10-CM

## 2024-06-29 MED ORDER — IOPAMIDOL (ISOVUE-300) INJECTION 61%
100.0000 mL | Freq: Once | INTRAVENOUS | Status: AC | PRN
  Administered 2024-06-29: 12:00:00 100 mL via INTRAVENOUS

## 2024-06-30 NOTE — Telephone Encounter (Signed)
 Requesting office sent duplicate request inquiring if pt has been cleared. Will send notes to requesting office as FYI pt wants to wait until she her appt 07/19/24 with Dr. Santo for review for preop clearance. Once the pt has been cleared the MD will have his nurse/CMA fax notes to requesting office.

## 2024-07-03 ENCOUNTER — Ambulatory Visit (HOSPITAL_COMMUNITY)
Admission: RE | Admit: 2024-07-03 | Discharge: 2024-07-03 | Disposition: A | Discharge status: Home / Self Care | Source: Ambulatory Visit | Attending: Internal Medicine | Admitting: Internal Medicine

## 2024-07-03 DIAGNOSIS — R42 Dizziness and giddiness: Secondary | ICD-10-CM | POA: Insufficient documentation

## 2024-07-03 DIAGNOSIS — I10 Essential (primary) hypertension: Secondary | ICD-10-CM | POA: Insufficient documentation

## 2024-07-03 DIAGNOSIS — I5181 Takotsubo syndrome: Secondary | ICD-10-CM | POA: Diagnosis not present

## 2024-07-03 DIAGNOSIS — R531 Weakness: Secondary | ICD-10-CM | POA: Insufficient documentation

## 2024-07-03 DIAGNOSIS — I491 Atrial premature depolarization: Secondary | ICD-10-CM | POA: Insufficient documentation

## 2024-07-03 DIAGNOSIS — I252 Old myocardial infarction: Secondary | ICD-10-CM | POA: Diagnosis not present

## 2024-07-03 DIAGNOSIS — R002 Palpitations: Secondary | ICD-10-CM | POA: Insufficient documentation

## 2024-07-03 DIAGNOSIS — E785 Hyperlipidemia, unspecified: Secondary | ICD-10-CM | POA: Insufficient documentation

## 2024-07-03 DIAGNOSIS — I340 Nonrheumatic mitral (valve) insufficiency: Secondary | ICD-10-CM | POA: Insufficient documentation

## 2024-07-03 LAB — ECHOCARDIOGRAM COMPLETE
AV Mean grad: 5 mmHg
AV Peak grad: 9.4 mmHg
Ao pk vel: 1.53 m/s
Area-P 1/2: 2.69 cm2
S' Lateral: 2.2 cm

## 2024-07-19 ENCOUNTER — Ambulatory Visit: Attending: Cardiovascular Disease | Admitting: Internal Medicine

## 2024-07-19 ENCOUNTER — Other Ambulatory Visit: Payer: Self-pay | Admitting: Internal Medicine

## 2024-07-19 VITALS — BP 143/55 | HR 57 | Ht 62.0 in | Wt 133.8 lb

## 2024-07-19 DIAGNOSIS — I422 Other hypertrophic cardiomyopathy: Secondary | ICD-10-CM

## 2024-07-19 DIAGNOSIS — Z01810 Encounter for preprocedural cardiovascular examination: Secondary | ICD-10-CM | POA: Diagnosis not present

## 2024-07-19 DIAGNOSIS — I421 Obstructive hypertrophic cardiomyopathy: Secondary | ICD-10-CM | POA: Diagnosis not present

## 2024-07-19 MED ORDER — ISOSORBIDE MONONITRATE ER 30 MG PO TB24: 15.0000 mg | ORAL_TABLET | Freq: Every day | ORAL | 3 refills | Status: AC

## 2024-07-19 NOTE — Progress Notes (Signed)
 " Cardiology Office Note:    Date:  07/19/2024   ID:  [name], DOB 1940-08-31, MRN [id number]  PCP:  Vernon Velna SAUNDERS, MD   [facility] Providers Cardiologist:  Wilbert Bihari, MD     Referring MD: Vernon Velna SAUNDERS, MD   CC: HCM  History of Present Illness:    [name] is a 84 y.o. 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  CMR septal thickness of 16 mm without LGE.  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  2024: had BP issues in 2024 related to AKI on losartan .  LVOT gradient improved.  There was no LVOT gradient found on cath. CP likely comes from obstructive diagonal vessel that is too small to intervene on. 2025: No obstructive HCM Social: two boys with negative screening by echo   [name]  has a history of hypertrophic nonobstructive cardiomyopathy with a maximal septal thickness of 16 millimeters. Her chest pain is described as a throbbing sensation lasting about 40 minutes, occurring intermittently during exertion or at rest.  Accompanied today by her sons.  She also has nonobstructive coronary artery disease with a small ramus lesion and a first diagonal lesion that were not amenable to percutaneous intervention. She associates her chest pain with exertion and reports feeling in a slump and lacking energy on bad days, while on good days she feels okay. On bad days, she feels unable to do anything and lacks motivation to get dressed.  Her blood pressure has been labile, with diastolic readings sometimes in the 40s since September. She experiences fatigue and shortness of breath with exertion, such as when changing her bed, which now takes her half a day due to needing frequent rest breaks. During therapy sessions, her blood  pressure drops have been noted.  She has a history of peripheral arterial disease. She was attending the [facility] for physical activity for a couple of months and noticed an improvement in her energy levels, but has since stopped, leading to a return of her fatigue.    Past Medical History:  Diagnosis Date   CAD in native artery    a. 04/2016 NSTEMI with cath showing 25% MCx, 75% DX1 lesion and moderate LVD 35-45% out of proportion to CAD and in a pattern of Takotsubo CM (no clear stressor)   Carotid stenosis    Carotid Doppler showed 40 to 59% stenosis of the right carotid artery and less than 50% stenosis of the right common carotid artery.  There is 1 to 39% stenosis of the left internal carotid artery and greater than 50% stenosis of the common carotid artery on the left.  There is also left subclavian artery stenosis. by dopplers 05/2021   Dyslipidemia    GERD (gastroesophageal reflux disease)    HOCM (hypertrophic obstructive cardiomyopathy) (HCC)    Hypercholesteremia    Hypertension    Hypertensive heart disease    initially she was thought to have a HOCM variant by cath with increased LVOT gradient but echo showed only a gradient across the LVOT and SAM and cardiac MRI did not show any late gad enhancement and only a LVOT gradient with 15mm BSH.  There was some mild SAM and mild MR.  EF normal  at 76%.     LVH (left ventricular hypertrophy)    a. cMRI 2018 - showing moderate Basal septal hypertrophy not classic for HOCM morphology with no delayed gadolium uptake to suggest myofibrillar disarray, + SAM with small LVOT gradient in regard to physiology and mild MR.   Mild aortic stenosis    NSTEMI (non-ST elevated myocardial infarction) (HCC) 04/2016   secondary to stress CM - Takotsubo CM   PAC (premature atrial contraction)    Event monitors 10/2013 and 08/2014 showed NSR, ST, PACs, PVCs.    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   CARDIAC CATHETERIZATION N/A 04/21/2016   Procedure: Left Heart Cath and Coronary Angiography;  Surgeon: Candyce GORMAN Reek, MD;  Location: [facility]VASIVE CV LAB;  Service: Cardiovascular;  Laterality: N/A;   IR ANGIOGRAM EXTREMITY LEFT  12/01/2023   IR GENERIC HISTORICAL  02/13/2016   IR RADIOLOGY PERIPHERAL GUIDED IV START 02/13/2016 Ozell Specking, MD [facility]INTER[facility]   IR GENERIC HISTORICAL  02/13/2016   IR US  GUIDE VASC ACCESS LEFT 02/13/2016 Ozell Specking, MD [facility]   IR RADIOLOGIST EVAL & MGMT  10/18/2023   IR RADIOLOGIST EVAL & MGMT  11/08/2023   LEFT HEART CATH AND CORONARY ANGIOGRAPHY N/A 01/06/2023   Procedure: LEFT HEART CATH AND CORONARY ANGIOGRAPHY;  Surgeon: Verlin Lonni BIRCH, MD;  Location: [facility] INVASIVE CV LAB;  Service: Cardiovascular;  Laterality: N/A;    Current Medications: Current Meds  Medication Sig   acetaminophen  (TYLENOL ) 325 MG tablet Take 2 tablets (650 mg total) by mouth every 4 (four) hours as needed for headache or mild pain.   alendronate (FOSAMAX) 70 MG tablet Take 70 mg by mouth every Monday.   aspirin  EC 81 MG EC tablet Take 1 tablet (81 mg total) by mouth daily.   atorvastatin  (LIPITOR ) 80 MG tablet TAKE 1 TABLET BY MOUTH EVERY DAY   Calcium  Carbonate-Vitamin D (CALCIUM -D PO) Take 1 tablet by mouth daily.   carvedilol  (COREG ) 12.5 MG tablet TAKE 1 TABLET BY MOUTH TWICE A DAY   Cholecalciferol (VITAMIN D) 125 MCG (5000 UT) CAPS Take 5,000 Units by mouth daily.   ezetimibe  (ZETIA ) 10 MG tablet TAKE 1 TABLET BY MOUTH EVERY DAY   ferrous sulfate 325 (65 FE) MG  EC tablet Take 325 mg by mouth 3 (three) times a week.   fluticasone (FLONASE) 50 MCG/ACT nasal spray Place 1 spray into both nostrils daily as needed (congestion).    fluticasone (FLOVENT HFA) 110 MCG/ACT inhaler Inhale 1 puff into the lungs daily as needed (shortness of breath).   gabapentin  (NEURONTIN ) 300 MG capsule Take 600 mg by mouth 3 (three) times daily.   isosorbide  mononitrate (IMDUR ) 30 MG 24 hr tablet Take 0.5 tablets (15 mg total) by mouth daily.   losartan  (COZAAR ) 25 MG tablet TAKE 1 TABLET (25 MG TOTAL) BY MOUTH DAILY.   Multiple Vitamin (MULTIVITAMIN WITH MINERALS) TABS tablet Take 1 tablet by mouth daily.   Omega-3 Fatty Acids (OMEGA-3 FISH OIL) 300  MG CAPS Take 300 mg by mouth daily.   omeprazole (PRILOSEC OTC) 20 MG tablet Take 20 mg by mouth daily as needed (for heartburn).   polyethylene glycol-electrolytes (NULYTELY) 420 g solution as directed. For Colonoscopy   spironolactone  (ALDACTONE ) 50 MG tablet Take 1 tablet (50 mg total) by mouth daily.   SYMBICORT 160-4.5 MCG/ACT inhaler as needed (sob).   Current Facility-Administered Medications for the 07/19/24 encounter (Office Visit) with Santo Stanly LABOR, MD  Medication   sodium chloride  flush (NS) 0.9 % injection 3 mL     Allergies:   Ace inhibitors, Codeine, and Penicillins   Social History   Socioeconomic History   Marital status: Single    Spouse name: Not on file   Number of children: Not on file   Years of education: Not on file   Highest education level: Not on file  Occupational History   Not on file  Tobacco Use   Smoking status: Former    Current packs/day: 0.00    Types: Cigarettes    Quit date: 07/13/2004    Years since quitting: 20.0   Smokeless tobacco: Never  Vaping Use   Vaping status: Never Used  Substance and Sexual Activity   Alcohol use: Yes    Comment: ocassionally    Drug use: No   Sexual activity: Not on file  Other Topics Concern   Not on file  Social History Narrative    Not on file   Social Drivers of Health   Tobacco Use: Medium Risk (05/11/2024)   Patient History    Smoking Tobacco Use: Former    Smokeless Tobacco Use: Never    Passive Exposure: Not on Actuary Strain: Not on file  Food Insecurity: Not on file  Transportation Needs: Not on file  Physical Activity: Not on file  Stress: Not on file  Social Connections: Not on file  Depression (EYV7-0): Not on file  Alcohol Screen: Not on file  Housing: Not on file  Utilities: Not on file  Health Literacy: Not on file     Family History: The patient's family history includes Heart attack in her brother, mother, and sister; Heart disease in her brother, mother, and sister.  ROS:   Please see the history of present illness.     EKGs/Labs/Other Studies Reviewed:    The following studies were reviewed today:  Cardiac Studies & Procedures   ______________________________________________________________________________________________ CARDIAC CATHETERIZATION  CARDIAC CATHETERIZATION 01/06/2023  Conclusion   Mid Cx lesion is 25% stenosed.   Ost 1st Diag lesion is 75% stenosed.   Ramus lesion is 75% stenosed.   Mid LM to Dist LM lesion is 25% stenosed.  Mild distal left main stenosis Mild ostial LAD stenosis Moderate ostial Diagonal stenosis, unchanged from last cath in 2017. Moderate caliber vessel. Mild Circumflex stenosis Small non-dominant RCA 15 mm LV to AO gradient with no significant change post PVC  Recommendations: Medical management of CAD  Findings Coronary Findings Diagnostic  Dominance: Left  Left Main Mid LM to Dist LM lesion is 25% stenosed.  Left Anterior Descending  First Diagonal Branch Ost 1st Diag lesion is 75% stenosed.  Ramus Intermedius Vessel is moderate in size. Ramus lesion is 75% stenosed.  Left Circumflex Mid Cx lesion is 25% stenosed.  Intervention  No interventions have been documented.   CARDIAC  CATHETERIZATION  CARDIAC CATHETERIZATION 04/21/2016  Conclusion  Mid Cx lesion, 25 %stenosed.  Ost 1st Diag lesion, 75 %stenosed.  The left ventricular ejection fraction  is 35-45% by visual estimate.  There is mild to moderate left ventricular systolic dysfunction in a pattern of Takotsubo cardiomyopathy.  LV end diastolic pressure is mildly elevated.  50-55 mm Hg resting gradient across the LVOT, likely due to subvalvular stenosis. No difficulty crossing the aortic valve.  Severe tortuosity of the right subclavian making torquing catheters difficult. Consider left radial if cath needed in the future.  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  ACE-I in AM if renal function stable.  No clear source of stress that may have caused this episode.  Findings Coronary Findings Diagnostic  Dominance: Left  Left Anterior Descending  First Diagonal Branch  Left Circumflex  Intervention  No interventions have been documented.   STRESS TESTS  MYOCARDIAL PERFUSION IMAGING 11/24/2019  Interpretation Summary  Nuclear stress EF: 63%.  There was no ST segment deviation noted during stress.  The study is normal.  This is a low risk study.  The left ventricular ejection fraction is normal (55-65%).   ECHOCARDIOGRAM  ECHOCARDIOGRAM COMPLETE 07/03/2024  Narrative ECHOCARDIOGRAM REPORT    Patient Name:   [name] Date of Exam: 07/03/2024 Medical Rec #:  [id number]       Height:       62.0 in Accession #:    [id number]      Weight:       131.8 lb Date of Birth:  16-Jan-1941       BSA:          1.601 m Patient Age:    83 years        BP:           134/67 mmHg Patient Gender: F               HR:           60 bpm. Exam Location:  [address]  Procedure: 2D Echo, Color Doppler and Cardiac Doppler (Both Spectral and Color Flow Doppler were utilized during procedure).  Indications:    R42 Dizziness  History:        Patient has prior history of  Echocardiogram examinations, most recent 12/29/2022. Arrythmias:PAC; Risk Factors:Hypertension and Dyslipidemia. NSTEMI. Palpitations. Takotsubo Cardiomyopathy.  Sonographer:    Carl Coma RDCS Referring Phys: [id number] [facility]  IMPRESSIONS   1. No significant LVOT gradient seen. No SAM. Left ventricular ejection fraction, by estimation, is 65 to 70%. The left ventricle has normal function. The left ventricle has no regional wall motion abnormalities. There is severe asymmetric left ventricular hypertrophy of the basal-septal segment. Left ventricular diastolic parameters are consistent with Grade I diastolic dysfunction (impaired relaxation). 2. Right ventricular systolic function is normal. The right ventricular size is normal. 3. Left atrial size was mildly dilated. 4. The mitral valve is normal in structure. Mild mitral valve regurgitation. No evidence of mitral stenosis. 5. The aortic valve is normal in structure. Aortic valve regurgitation is not visualized. No aortic stenosis is present. 6. The inferior vena cava is normal in size with greater than 50% respiratory variability, suggesting right atrial pressure of 3 mmHg.  FINDINGS Left Ventricle: No significant LVOT gradient seen. No SAM. Left ventricular ejection fraction, by estimation, is 65 to 70%. The left ventricle has normal function. The left ventricle has no regional wall motion abnormalities. The left ventricular internal cavity size was normal in size. There is severe asymmetric left ventricular hypertrophy of the basal-septal segment. Left ventricular diastolic parameters are consistent with Grade I diastolic dysfunction (impaired relaxation).  Right Ventricle: The right ventricular size is normal. No increase in right ventricular wall thickness. Right ventricular systolic function is normal.  Left Atrium: Left atrial size was mildly dilated.  Right Atrium: Right atrial size was normal in  size.  Pericardium: There is no evidence of pericardial effusion.  Mitral Valve: The mitral valve is normal in structure. Mild mitral valve regurgitation. No evidence of mitral valve stenosis.  Tricuspid Valve: The tricuspid valve is normal in structure. Tricuspid valve regurgitation is mild . No evidence of tricuspid stenosis.  The aortic valve is normal in structure. Aortic valve regurgitation is not visualized. No aortic stenosis is present. Pulmonic Valve: The pulmonic valve was normal in structure. Pulmonic valve regurgitation is not visualized. No evidence of pulmonic stenosis.  Aorta: The aortic root is normal in size and structure.  Venous: The inferior vena cava is normal in size with greater than 50% respiratory variability, suggesting right atrial pressure of 3 mmHg.  IAS/Shunts: No atrial level shunt detected by color flow Doppler.   LEFT VENTRICLE PLAX 2D LVIDd:         4.15 cm Diastology LVIDs:         2.20 cm LV e' medial:    4.42 cm/s LV PW:         0.85 cm LV E/e' medial:  20.8 LV IVS:        0.80 cm LV e' lateral:   4.70 cm/s LV E/e' lateral: 19.6   RIGHT VENTRICLE             IVC RV Basal diam:  3.60 cm     IVC diam: 1.00 cm RV S prime:     16.00 cm/s TAPSE (M-mode): 2.3 cm  LEFT ATRIUM             Index        RIGHT ATRIUM           Index LA diam:        4.30 cm 2.69 cm/m   RA Area:     12.10 cm LA Vol (A2C):   40.3 ml 25.17 ml/m  RA Volume:   29.10 ml  18.17 ml/m LA Vol (A4C):   74.7 ml 46.65 ml/m LA Biplane Vol: 56.0 ml 34.97 ml/m AORTIC VALVE AV Vmax:           153.00 cm/s AV Vmean:          104.000 cm/s AV VTI:            0.385 m AV Peak Grad:      9.4 mmHg AV Mean Grad:      5.0 mmHg LVOT Vmax:         123.00 cm/s LVOT Vmean:        85.800 cm/s LVOT VTI:          0.318 m LVOT/AV VTI ratio: 0.82  AORTA Ao Asc diam: 2.90 cm  MITRAL VALVE                TRICUSPID VALVE MV Area (PHT): 2.69 cm     TR Peak grad:   21.0 mmHg MV Decel Time:  282 msec     TR Vmax:        229.00 cm/s MV E velocity: 92.00 cm/s MV A velocity: 111.00 cm/s  SHUNTS MV E/A ratio:  0.83         Systemic VTI: 0.32 m  Dub Tobb DO Electronically signed by Dub Huntsman DO Signature Date/Time: 07/03/2024/3:22:24 PM  Final    MONITORS  LONG TERM MONITOR (3-14 DAYS) 06/21/2024  Narrative   Patient had a minimum heart rate of 27 bpm, maximum heart rate of 171 bpm, and average heart rate of 62 bpm. Predominant underlying rhythm was sinus rhythm. Three short runs of SVT, lasting 6 beats at longest. Isolated PACs were rare (<1.0%). Isolated PVCs were occasional (1.1%). One isolated episode of 2nd degree heart block with no symptoms at ~7 PM. Triggered and diary events associated with sinus rhythm and occasionally PVCs.  Asymptomatic 2nd degree heart block - possibly nocturnal.   CT SCANS  CT CORONARY MORPH W/CTA COR W/SCORE 02/13/2016  Addendum 02/13/2016  5:40 PM ADDENDUM REPORT: 02/13/2016 17:38  CLINICAL DATA:  Chest pain  EXAM: Cardiac CTA  MEDICATIONS: Sub lingual nitro. 4mg  and lopressor  10mg   TECHNIQUE: Patient was a difficult iv cannulation. Dr Frederic Ralphs eventually used micro-puncture technique in IR radiology to start iv  The patient was scanned on a Philips 256 slice scanner. Gantry rotation speed was 270 msecs. Collimation was .9mm. A 100 kV prospective scan was triggered in the descending thoracic aorta at 111 HU's with 5% padding centered around 78% of the R-R interval. Average HR during the scan was 72 bpm. The 3D data set was interpreted on a dedicated work station using MPR, MIP and VRT modes. A total of 80cc of contrast was used.  FINDINGS: Non-cardiac: See separate report from [facility]. No significant findings on limited lung and soft tissue windows.  Calcium  Score:  2 with isolated area of calcification in LM  Coronary Arteries: Right dominant with no anomalies  LM:  Small area of non  obstructive calcium   LAD:  Normal tortuous  D1:  Normal large and branching  Circumflex:  Left dominant normal  OM1: Normal  OM2: Normal  RCA:  Small and non dominant normal  PDA:  Left sided normal  PLA:  Left sided normal  IMPRESSION: 1) Poor quality study due to motion artifact and somewhat poor filling of coronary arteries with tortuosity  2) Calcium  score 2 isolated foci in LM 23rd percentile for age and sex matched controls  3) Normal left dominant coronary arteries with noted motion artifact and poor distal vessel visualization  Maude Emmer   Electronically Signed By: Maude Emmer M.D. On: 02/13/2016 17:38  Narrative EXAM: OVER-READ INTERPRETATION  CT CHEST  The following report is an over-read performed by radiologist Dr. Toribio Cove [facility] on 02/13/2016. This over-read does not include interpretation of cardiac or coronary anatomy or pathology. The coronary calcium  score/coronary CTA interpretation by the cardiologist is attached.  COMPARISON:  None.  FINDINGS: Aortic atherosclerosis. Within the visualized portions of the thorax there are no suspicious appearing pulmonary nodules or masses, there is no acute consolidative airspace disease, no pleural effusions, no pneumothorax and no lymphadenopathy. Visualized portions of the upper abdomen again demonstrate aortic atherosclerosis. High density material in the collecting systems of the kidneys bilaterally, presumably contrast material related to test bolus injection. There are no aggressive appearing lytic or blastic lesions noted in the visualized portions of the skeleton.  IMPRESSION: 1. Aortic atherosclerosis. 2. No acute incidental noncardiac findings noted.  Electronically Signed: By: Toribio Aye M.D. On: 02/13/2016 16:32   CARDIAC MRI  MR CARDIAC MORPHOLOGY W WO CONTRAST 05/13/2022  Narrative CLINICAL DATA:  Clinical question of hypertrophic  cardiomyopathy Study assumes BSA of 1.63 m2.  EXAM: CARDIAC MRI  TECHNIQUE: The patient was scanned on a 1.5 Tesla GE  magnet. A dedicated cardiac coil was used. Functional imaging was done using Fiesta sequences. 2,3, and 4 chamber views were done to assess for RWMA's. Modified Simpson's rule using a short axis stack was used to calculate an ejection fraction on a dedicated work [employer]. The patient received 7 cc of Gadavist . After 10 minutes inversion recovery sequences were used to assess for infiltration and scar tissue. Velocity encoding sequences performed for valve assessment.  CONTRAST:  7 cc  of Gadavist   FINDINGS: 1. Normal left ventricular size, with LVEDD 35 mm, and LVEDVi 58 mL/m2.  Severe asymmetric septal hypertrophy, with intraventricular septal thickness of 17 mm, posterior wall thickness of 6 mm, and myocardial mass index of 49 g/m2.  Normal left ventricular systolic function (LVEF =67%). There is systolic anterior motion of the anterior mitral valve leaflet.  Left ventricular parametric mapping notable for normal T2 and ECV signal.  There is no late gadolinium enhancement in the left ventricular myocardium. Six standard deviation assessment used.  2. Normal right ventricular size with RVEDVI 50 mL/m2.  Normal right ventricular thickness.  Normal right ventricular systolic function (RVEF =55%). There are no regional wall motion abnormalities or aneurysms.  3.  Moderate left atrial enlargement and normal right atrial size.  4. Normal size of the aortic root, ascending aorta and pulmonary artery.  5. Valve assessment:  Aortic Valve: Tri-leaflet aortic valve. No significant regurgitation. Regurgitant fraction 2 %.  LVOT: Aliasing artifact. Peak LVOT Velocity 3.16 m/s at best estimate. LVOT gradient is above 16 mm Hg.  Pulmonic Valve: No significant regurgitation. Regurgitant fraction 3 %.  Tricuspid Valve: Mild tricuspid  regurgitation. Regurgitant fraction 17 %.  Mitral Valve: Moderate mitral regurgitation from SAM. Regurgitant fraction 27 %.  6. Normal pericardium. Small pericardial effusion anterior to the RV base and posterior to the LV base with no evidence if increased pericardial pressure.  7. Grossly, no extracardiac findings. Recommended dedicated study if concerned for non-cardiac pathology.  IMPRESSION: Consistent with hypertrophic cardiomyopathy.  Maximal septal thickness 17 mm.  No LGE.  No Aneurysm.  LVEF 67%.  LVOT obstruction noted.  Moderate mitral regurgitation.  Compared to prior slight increase in hypertrophy with increase in MR and LAE, but with no LGE.  Stanly Leavens MD   Electronically Signed By: Stanly Leavens M.D. On: 05/13/2022 13:33   ______________________________________________________________________________________________       Recent Labs: 12/01/2023: BUN 18; Creatinine, Ser 1.09; Hemoglobin 11.1; Platelets 134; Potassium 5.2; Sodium 136 04/04/2024: TSH 1.760  Recent Lipid Panel    Component Value Date/Time   CHOL 154 09/16/2020 1153   TRIG 60 09/16/2020 1153   HDL 55 09/16/2020 1153   CHOLHDL 2.8 09/16/2020 1153   CHOLHDL 3.9 04/22/2016 0017   VLDL 52 (H) 04/22/2016 0017   LDLCALC 87 09/16/2020 1153       Physical Exam:    VS:  BP (!) 143/55 (BP Location: Right Arm)   Pulse (!) 57   Ht 5' 2 (1.575 m)   Wt 133 lb 12.8 oz (60.7 kg)   SpO2 97%   BMI 24.47 kg/m     Wt Readings from Last 3 Encounters:  07/19/24 133 lb 12.8 oz (60.7 kg)  04/11/24 131 lb 12.8 oz (59.8 kg)  04/04/24 133 lb (60.3 kg)    GEN: Elderly female in no acute distress HEENT: Normal NECK: No JVD CARDIAC: RRR, Systolic murmur at rest, no rubs, gallops RESPIRATORY:  Clear to auscultation without rales, wheezing or rhonchi  ABDOMEN: Soft, non-tender, non-distended MUSCULOSKELETAL:  R leg prothesis in place, no L Leg edema SKIN: Warm and  dry NEUROLOGIC:  Alert and oriented x 3 PSYCHIATRIC:  Normal affect   PLAN:    Hypertrophic Cardiomyopathy favored over Hypertensive heart disease - septal Variant  - peak gradient 17 on Valvalva in 2024 - with SAM MR that has likely persistent when not supine - suspicion of Fabry's/Danon or other mimics of HCM: Has concurrent CAD - Gene variant: NA  Hypertrophic Cardiomyopathy - Septal Variant - peak gradient < 30 mm Hg at rest, 06/2024  - suspicion of Fabry's/Danon/Noonan's or other mimics of HCM: Possible hypertensive phenocopy - Gene variant: Deferred - NYHA II  - Non HCM Contributors to disease/status  Coronary artery disease with angina Coronary artery disease with distal obstructions not amenable to stenting. Reports intermittent chest pain, particularly after exertion, suggestive of exertional angina. Previous heart catheterization showed nonamenable lesions. Goal is to minimize chest pain and optimize quality of life. - Ordered stress test to assess for high-risk features. - Initiated isosorbide  mononitrate at a low dose to manage chest pain. - If stress test is low risk and isosorbide  mononitrate is tolerated, continue current management. - If stress test is high risk or isosorbide  mononitrate is not tolerated, will consider ranolazine  (Ranexa ) as a secondary medication; she had some concerns about IMDUR  and if this does not agree with her will start Ranexa  - If high-risk stress test or medication intolerance, will discuss repeat heart catheterization in the setting of her PAD  Labile hypertension Episodes of low and high blood pressure. Blood pressure management complicated by complex vascular disease and need to balance with chest pain management. Recent episodes of low blood pressure during therapy sessions. - Monitor blood pressure closely. - Adjust medications as needed to maintain stable blood pressure.  Peripheral arterial disease Complex peripheral arterial  disease with known blockage in the leg. Previous interventions not feasible due to small veins. Condition contributes to overall vascular complexity. - Continue current management.  Family history, Discussed family screening via echo for her two sons; Calleen is due for imaging this year  SCD  Assessment - Due to Age, Primary prevention screening is not indicated  Atrial fibrillation Assessment  - HCM-AF score NA (Age); no AF on 2025 heart monitor (done for frequent PVCs that have improved despite decreasing AV nodal therapy)  Medication symptom plan - Maximal septal thickness of 16 mm. No evidence of obstructive physiology or hypertrophic obstructive cardiomyopathy on recent echocardiogram. No atrial fibrillation or frequent premature ventricular contractions. Management complicated by labile blood pressure and complex vascular disease. - Continue current management without changes.  Cannot do SqST or Stress echo, if we induce a gradient I would start CMI therapy   Three months with me or Tessa  Time:   I have spent a total of 40 minutes with the patient reviewing notes, imaging, EKGs, labs, echo and examining the patient as well as establishing an assessment and plan that was discussed personally with the patient. Discussed disease state education, family screening with her two sons, and SDM about her angina.       Stanly Leavens, MD FASE [facility] Cardiologist [facility]  [address] [place], [place] [address] [phone]  11:48 AM  "

## 2024-07-19 NOTE — Patient Instructions (Signed)
 Medication Instructions:  Your physician has recommended you make the following change in your medication:  START: isosorbide  mononitrate (Imdur ) 15 mg by mouth once daily.  You will have to cut pill in half.  *If you need a refill on your cardiac medications before your next appointment, please call your pharmacy*  Lab Work: NONE  If you have labs (blood work) drawn today and your tests are completely normal, you will receive your results only by: MyChart Message (if you have MyChart) OR A paper copy in the mail If you have any lab test that is abnormal or we need to change your treatment, we will call you to review the results.  Testing/Procedures: Your physician has requested that you have a lexiscan  myoview . For further information please visit [email or web address] Please follow instructions.   You are scheduled for a Myocardial Perfusion Imaging Study. Please arrive 15 minutes prior to your appointment time for registration and insurance purposes.   The test will take approximately 3 to 4 hours to complete; you may bring reading material.  If someone comes with you to your appointment, they will need to remain in the main lobby due to limited space in the testing area.    How to prepare for your Myocardial Perfusion Test: Do not eat or drink 3 hours prior to your test, except you may have water. Do not consume products containing caffeine (regular or decaffeinated) 12 hours prior to your test. (ex: coffee, chocolate, sodas, tea). Do bring a list of your current medications with you.  If not listed below, you may take your medications as normal. Do wear comfortable clothes (no dresses or overalls) and walking shoes, tennis shoes preferred (No heels or open toe shoes are allowed). Do NOT wear cologne, perfume, aftershave, or lotions (deodorant is allowed). If these instructions are not followed, your test will have to be rescheduled.  If you cannot keep your appointment, please  provide 24 hours notification to the Nuclear Lab, to avoid a possible $50 charge to your account.       Follow-Up: At [facility], you and your health needs are our priority.  As part of our continuing mission to provide you with exceptional heart care, our providers are all part of one team.  This team includes your primary Cardiologist (physician) and Advanced Practice Providers or APPs (Physician Assistants and Nurse Practitioners) who all work together to provide you with the care you need, when you need it.  Your next appointment:   2-3 month(s)  Provider:   Stanly Leavens, MD or Orren Fabry, PA-C

## 2024-07-20 ENCOUNTER — Telehealth (HOSPITAL_COMMUNITY): Payer: Self-pay | Admitting: *Deleted

## 2024-07-20 NOTE — Telephone Encounter (Signed)
 Close encounter

## 2024-07-24 ENCOUNTER — Telehealth (HOSPITAL_COMMUNITY): Payer: Self-pay | Admitting: Radiology

## 2024-07-24 NOTE — Telephone Encounter (Signed)
.  Patient given detailed instructions per Myocardial Perfusion Study Information Sheet for the test on 07/26/2024 at 10am. Patient notified to arrive 15 minutes early and that it is imperative to arrive on time for appointment to keep from having the test rescheduled.  If you need to cancel or reschedule your appointment, please call the office within 24 hours of your appointment. . Patient verbalized understanding.EHK

## 2024-07-26 ENCOUNTER — Ambulatory Visit (HOSPITAL_COMMUNITY)
Admission: RE | Admit: 2024-07-26 | Discharge: 2024-07-26 | Disposition: A | Discharge status: Home / Self Care | Source: Ambulatory Visit | Attending: Cardiology | Admitting: Cardiology

## 2024-07-26 DIAGNOSIS — Z01810 Encounter for preprocedural cardiovascular examination: Secondary | ICD-10-CM | POA: Insufficient documentation

## 2024-07-26 DIAGNOSIS — I422 Other hypertrophic cardiomyopathy: Secondary | ICD-10-CM | POA: Insufficient documentation

## 2024-07-26 MED ORDER — TECHNETIUM TC 99M TETROFOSMIN IV KIT
9.7000 | PACK | Freq: Once | INTRAVENOUS | Status: AC | PRN
  Administered 2024-07-26: 9.7 via INTRAVENOUS

## 2024-07-26 MED ORDER — REGADENOSON 0.4 MG/5ML IV SOLN
INTRAVENOUS | Status: AC
  Filled 2024-07-26: qty 5

## 2024-07-26 MED ORDER — TECHNETIUM TC 99M TETROFOSMIN IV KIT
30.7000 | PACK | Freq: Once | INTRAVENOUS | Status: AC | PRN
  Administered 2024-07-26: 30.7 via INTRAVENOUS

## 2024-07-26 MED ORDER — REGADENOSON 0.4 MG/5ML IV SOLN
0.4000 mg | Freq: Once | INTRAVENOUS | Status: AC
  Administered 2024-07-26: 0.4 mg via INTRAVENOUS

## 2024-07-27 ENCOUNTER — Ambulatory Visit: Payer: Self-pay | Admitting: Internal Medicine

## 2024-07-27 LAB — MYOCARDIAL PERFUSION IMAGING
Base ST Depression (mm): 0 mm
LV dias vol: 50 mL (ref 46–106)
LV sys vol: 16 mL
Nuc Stress EF: 68 %
Peak HR: 85 {beats}/min
Rest HR: 55 {beats}/min
Rest Nuclear Isotope Dose: 9.7 mCi
SDS: 0
SRS: 5
SSS: 0
ST Depression (mm): 0 mm
TID: 1.05

## 2024-08-15 ENCOUNTER — Telehealth (HOSPITAL_BASED_OUTPATIENT_CLINIC_OR_DEPARTMENT_OTHER): Payer: Self-pay | Admitting: *Deleted

## 2024-08-15 NOTE — Telephone Encounter (Signed)
 Dr. Santo,  You saw this patient on 07/19/24. Per office protocol, will you please comment on medical clearance for colonoscopy and endoscopy?  Please route your response to P [facility] DIV Preop. I will communicate with requesting office once you have given recommendations.   Thank you!  Mardy Pizza, NP

## 2024-08-16 NOTE — Telephone Encounter (Signed)
" ° °  Patient Name: [name]  DOB: Sep 18, 1940 MRN: [id number]  Primary Cardiologist: Wilbert Bihari, MD  Chart reviewed as part of pre-operative protocol coverage. Pre-op clearance already addressed by colleagues in earlier phone notes. To summarize recommendations:  07/27/24 Patient has NM Stress test without perfusion defect. Was NYHA Class I at 08/02/24 phone follow up.  We instructed to her take her IMDUR  as instructed to prevent further chest pain.   She has had a resolution of her obstruction physiology from her oHCM.    She has CAD and PAD (seen by [facility]).  She is on ASA; if this needs to be held for her endoscopy would recommend starting once able.  No recent interventions.   From a cardiac perspective she has been well optimized.  Reasonable to proceed.  -Dr. Santo   Will route this bundled recommendation to requesting provider via Epic fax function and remove from pre-op pool. Please call with questions.  Orren LOISE Fabry, PA-C 08/16/2024, 5:01 PM  "

## 2024-10-24 ENCOUNTER — Encounter (HOSPITAL_COMMUNITY)

## 2024-10-24 ENCOUNTER — Ambulatory Visit: Admitting: Vascular Surgery
